# Patient Record
Sex: Male | Born: 1950 | Race: Black or African American | Hispanic: No | Smoking: Former smoker
Health system: Southern US, Community
[De-identification: ages and names within clinical notes are randomized; demographics above are authoritative.]

## PROBLEM LIST (undated history)

## (undated) DIAGNOSIS — K635 Polyp of colon: Secondary | ICD-10-CM

## (undated) DIAGNOSIS — I10 Essential (primary) hypertension: Secondary | ICD-10-CM

## (undated) DIAGNOSIS — I639 Cerebral infarction, unspecified: Secondary | ICD-10-CM

## (undated) DIAGNOSIS — R079 Chest pain, unspecified: Secondary | ICD-10-CM

## (undated) DIAGNOSIS — E785 Hyperlipidemia, unspecified: Secondary | ICD-10-CM

## (undated) DIAGNOSIS — Z531 Procedure and treatment not carried out because of patient's decision for reasons of belief and group pressure: Secondary | ICD-10-CM

## (undated) DIAGNOSIS — E119 Type 2 diabetes mellitus without complications: Secondary | ICD-10-CM

## (undated) HISTORY — PX: APPENDECTOMY: SHX54

## (undated) HISTORY — DX: Polyp of colon: K63.5

## (undated) HISTORY — DX: Hyperlipidemia, unspecified: E78.5

## (undated) HISTORY — DX: Cerebral infarction, unspecified: I63.9

## (undated) HISTORY — PX: LUMBAR DISC SURGERY: SHX700

## (undated) HISTORY — PX: BACK SURGERY: SHX140

---

## 2010-04-21 ENCOUNTER — Inpatient Hospital Stay (INDEPENDENT_AMBULATORY_CARE_PROVIDER_SITE_OTHER)
Admission: RE | Admit: 2010-04-21 | Discharge: 2010-04-21 | Disposition: A | Discharge status: Home / Self Care | Payer: Self-pay | Source: Ambulatory Visit | Attending: Family Medicine | Admitting: Family Medicine

## 2010-04-21 DIAGNOSIS — J019 Acute sinusitis, unspecified: Secondary | ICD-10-CM

## 2010-04-21 DIAGNOSIS — I10 Essential (primary) hypertension: Secondary | ICD-10-CM

## 2010-04-21 DIAGNOSIS — E119 Type 2 diabetes mellitus without complications: Secondary | ICD-10-CM

## 2015-08-25 ENCOUNTER — Other Ambulatory Visit: Payer: Self-pay | Admitting: Nurse Practitioner

## 2015-08-25 ENCOUNTER — Encounter: Payer: Self-pay | Admitting: Internal Medicine

## 2015-08-25 DIAGNOSIS — R109 Unspecified abdominal pain: Secondary | ICD-10-CM

## 2015-09-11 ENCOUNTER — Emergency Department (HOSPITAL_COMMUNITY): Payer: No Typology Code available for payment source

## 2015-09-11 ENCOUNTER — Emergency Department (HOSPITAL_COMMUNITY)
Admission: EM | Admit: 2015-09-11 | Discharge: 2015-09-11 | Disposition: A | Discharge status: Home / Self Care | Payer: No Typology Code available for payment source | Attending: Physician Assistant | Admitting: Physician Assistant

## 2015-09-11 ENCOUNTER — Encounter (HOSPITAL_COMMUNITY): Payer: Self-pay | Admitting: Family Medicine

## 2015-09-11 ENCOUNTER — Ambulatory Visit: Payer: Medicare (Managed Care) | Admitting: Sports Medicine

## 2015-09-11 DIAGNOSIS — Y939 Activity, unspecified: Secondary | ICD-10-CM | POA: Diagnosis not present

## 2015-09-11 DIAGNOSIS — S0990XA Unspecified injury of head, initial encounter: Secondary | ICD-10-CM | POA: Insufficient documentation

## 2015-09-11 DIAGNOSIS — Y999 Unspecified external cause status: Secondary | ICD-10-CM | POA: Insufficient documentation

## 2015-09-11 DIAGNOSIS — I10 Essential (primary) hypertension: Secondary | ICD-10-CM | POA: Diagnosis not present

## 2015-09-11 DIAGNOSIS — Y92410 Unspecified street and highway as the place of occurrence of the external cause: Secondary | ICD-10-CM | POA: Insufficient documentation

## 2015-09-11 DIAGNOSIS — R109 Unspecified abdominal pain: Secondary | ICD-10-CM

## 2015-09-11 DIAGNOSIS — R1031 Right lower quadrant pain: Secondary | ICD-10-CM | POA: Diagnosis not present

## 2015-09-11 DIAGNOSIS — E119 Type 2 diabetes mellitus without complications: Secondary | ICD-10-CM | POA: Diagnosis not present

## 2015-09-11 HISTORY — DX: Essential (primary) hypertension: I10

## 2015-09-11 LAB — CBC WITH DIFFERENTIAL/PLATELET
Basophils Absolute: 0 K/uL (ref 0.0–0.1)
Basophils Relative: 0 %
Eosinophils Absolute: 0.1 K/uL (ref 0.0–0.7)
Eosinophils Relative: 1 %
HCT: 39.3 % (ref 39.0–52.0)
Hemoglobin: 13 g/dL (ref 13.0–17.0)
Lymphocytes Relative: 35 %
Lymphs Abs: 1.6 K/uL (ref 0.7–4.0)
MCH: 26.3 pg (ref 26.0–34.0)
MCHC: 33.1 g/dL (ref 30.0–36.0)
MCV: 79.6 fL (ref 78.0–100.0)
Monocytes Absolute: 0.4 K/uL (ref 0.1–1.0)
Monocytes Relative: 8 %
Neutro Abs: 2.4 K/uL (ref 1.7–7.7)
Neutrophils Relative %: 56 %
Platelets: 195 K/uL (ref 150–400)
RBC: 4.94 MIL/uL (ref 4.22–5.81)
RDW: 12.7 % (ref 11.5–15.5)
WBC: 4.4 K/uL (ref 4.0–10.5)

## 2015-09-11 LAB — I-STAT CHEM 8, ED
BUN: 15 mg/dL (ref 6–20)
Calcium, Ion: 1.17 mmol/L (ref 1.12–1.23)
Chloride: 102 mmol/L (ref 101–111)
Creatinine, Ser: 1.2 mg/dL (ref 0.61–1.24)
Glucose, Bld: 175 mg/dL — ABNORMAL HIGH (ref 65–99)
HCT: 40 % (ref 39.0–52.0)
Hemoglobin: 13.6 g/dL (ref 13.0–17.0)
Potassium: 4 mmol/L (ref 3.5–5.1)
Sodium: 140 mmol/L (ref 135–145)
TCO2: 25 mmol/L (ref 0–100)

## 2015-09-11 MED ORDER — FENTANYL CITRATE (PF) 100 MCG/2ML IJ SOLN
50.0000 ug | Freq: Once | INTRAMUSCULAR | Status: DC
  Filled 2015-09-11: qty 2

## 2015-09-11 MED ORDER — CYCLOBENZAPRINE HCL 10 MG PO TABS: 10.0000 mg | ORAL_TABLET | Freq: Two times a day (BID) | ORAL | Status: DC | PRN

## 2015-09-11 MED ORDER — FENTANYL CITRATE (PF) 100 MCG/2ML IJ SOLN
50.0000 ug | Freq: Once | INTRAMUSCULAR | Status: AC
  Administered 2015-09-11: 50 ug via INTRAMUSCULAR

## 2015-09-11 MED ORDER — NAPROXEN 500 MG PO TABS: 500.0000 mg | ORAL_TABLET | Freq: Two times a day (BID) | ORAL | Status: DC

## 2015-09-11 NOTE — ED Notes (Signed)
Patient transported to CT 
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
## 2015-10-05 ENCOUNTER — Ambulatory Visit (AMBULATORY_SURGERY_CENTER): Payer: Self-pay | Admitting: *Deleted

## 2015-10-05 ENCOUNTER — Encounter (INDEPENDENT_AMBULATORY_CARE_PROVIDER_SITE_OTHER): Payer: Self-pay

## 2015-10-05 VITALS — Ht 68.0 in | Wt 159.6 lb

## 2015-10-05 DIAGNOSIS — Z1211 Encounter for screening for malignant neoplasm of colon: Secondary | ICD-10-CM

## 2015-10-05 MED ORDER — NA SULFATE-K SULFATE-MG SULF 17.5-3.13-1.6 GM/177ML PO SOLN: 1.0000 | Freq: Once | ORAL | 0 refills | Status: AC

## 2015-10-05 NOTE — Progress Notes (Signed)
No allergies to eggs or soy. No problems with anesthesia.  Pt given Emmi instructions for colonoscopy  No oxygen use  No diet drug use  

## 2015-10-19 ENCOUNTER — Encounter: Payer: Self-pay | Admitting: Internal Medicine

## 2015-10-19 ENCOUNTER — Ambulatory Visit (AMBULATORY_SURGERY_CENTER): Payer: Medicare Other | Admitting: Internal Medicine

## 2015-10-19 VITALS — BP 152/88 | HR 71 | Temp 97.5°F | Resp 14 | Ht 68.0 in | Wt 159.0 lb

## 2015-10-19 DIAGNOSIS — Z1211 Encounter for screening for malignant neoplasm of colon: Secondary | ICD-10-CM | POA: Diagnosis not present

## 2015-10-19 DIAGNOSIS — D128 Benign neoplasm of rectum: Secondary | ICD-10-CM | POA: Diagnosis not present

## 2015-10-19 DIAGNOSIS — D125 Benign neoplasm of sigmoid colon: Secondary | ICD-10-CM | POA: Diagnosis not present

## 2015-10-19 DIAGNOSIS — D122 Benign neoplasm of ascending colon: Secondary | ICD-10-CM | POA: Diagnosis not present

## 2015-10-19 DIAGNOSIS — D129 Benign neoplasm of anus and anal canal: Secondary | ICD-10-CM

## 2015-10-19 DIAGNOSIS — D120 Benign neoplasm of cecum: Secondary | ICD-10-CM

## 2015-10-19 LAB — GLUCOSE, CAPILLARY
GLUCOSE-CAPILLARY: 207 mg/dL — AB (ref 65–99)
Glucose-Capillary: 197 mg/dL — ABNORMAL HIGH (ref 65–99)

## 2015-10-19 MED ORDER — SODIUM CHLORIDE 0.9 % IV SOLN: 500.0000 mL | INTRAVENOUS | Status: DC

## 2015-10-19 NOTE — Progress Notes (Signed)
After procedure was over and the order req was in the computer, Dr. Henrene Pastor asked that I change specimen bottle #2 from cecal polyp to sigmoid colon polyp.  I removed the orfer req in the computer and made new order.  maw

## 2015-10-19 NOTE — Progress Notes (Signed)
A and O x3. Report to RN. Tolerated MAC anesthesia well. 

## 2015-10-19 NOTE — Patient Instructions (Signed)
YOU HAD AN ENDOSCOPIC PROCEDURE TODAY AT [facility]:   Refer to the procedure report that was given to you for any specific questions about what was found during the examination.  If the procedure report does not answer your questions, please call your gastroenterologist to clarify.  If you requested that your care partner not be given the details of your procedure findings, then the procedure report has been included in a sealed envelope for you to review at your convenience later.  YOU SHOULD EXPECT: Some feelings of bloating in the abdomen. Passage of more gas than usual.  Walking can help get rid of the air that was put into your GI tract during the procedure and reduce the bloating. If you had a lower endoscopy (such as a colonoscopy or flexible sigmoidoscopy) you may notice spotting of blood in your stool or on the toilet paper. If you underwent a bowel prep for your procedure, you may not have a normal bowel movement for a few days.  Please Note:  You might notice some irritation and congestion in your nose or some drainage.  This is from the oxygen used during your procedure.  There is no need for concern and it should clear up in a day or so.  SYMPTOMS TO REPORT IMMEDIATELY:   Following lower endoscopy (colonoscopy or flexible sigmoidoscopy):  Excessive amounts of blood in the stool  Significant tenderness or worsening of abdominal pains  Swelling of the abdomen that is new, acute  Fever of 100F or higher   For urgent or emergent issues, a gastroenterologist can be reached at any hour by calling [phone].   DIET:  We do recommend a small meal at first, but then you may proceed to your regular diet.  Drink plenty of fluids but you should avoid alcoholic beverages for 24 hours.  ACTIVITY:  You should plan to take it easy for the rest of today and you should NOT DRIVE or use heavy machinery until tomorrow (because of the sedation medicines used during the test).     FOLLOW UP: Our staff will call the number listed on your records the next business day following your procedure to check on you and address any questions or concerns that you may have regarding the information given to you following your procedure. If we do not reach you, we will leave a message.  However, if you are feeling well and you are not experiencing any problems, there is no need to return our call.  We will assume that you have returned to your regular daily activities without incident.  If any biopsies were taken you will be contacted by phone or by letter within the next 1-3 weeks.  Please call us at [phone] if you have not heard about the biopsies in 3 weeks.    SIGNATURES/CONFIDENTIALITY: You and/or your care partner have signed paperwork which will be entered into your electronic medical record.  These signatures attest to the fact that that the information above on your After Visit Summary has been reviewed and is understood.  Full responsibility of the confidentiality of this discharge information lies with you and/or your care-partner.   Continue present medications. Polyps handout provided.

## 2015-10-19 NOTE — Op Note (Signed)
[name] Procedure Date: 10/19/2015 7:46 AM MRN: [id number] Endoscopist: Docia Chuck. Richard Bennett , MD Age: 65 Referring MD:  Date of Birth: Jul 04, 1950 Gender: Male Account #: [id number] Procedure:                Colonoscopy, with cold snare polypectomy X3 Indications:              Screening for colorectal malignant neoplasm Medicines:                Monitored Anesthesia Care Procedure:                Pre-Anesthesia Assessment:                           - Prior to the procedure, a History and Physical                            was performed, and patient medications and                            allergies were reviewed. The patient's tolerance of                            previous anesthesia was also reviewed. The risks                            and benefits of the procedure and the sedation                            options and risks were discussed with the patient.                            All questions were answered, and informed consent                            was obtained. Prior Anticoagulants: The patient has                            taken no previous anticoagulant or antiplatelet                            agents. ASA Grade Assessment: II - A patient with                            mild systemic disease. After reviewing the risks                            and benefits, the patient was deemed in                            satisfactory condition to undergo the procedure.                           After obtaining informed consent, the colonoscope  was passed under direct vision. Throughout the                            procedure, the patient's blood pressure, pulse, and                            oxygen saturations were monitored continuously. The                            Model CF-HQ190L [id number]) scope was introduced                            through the anus and advanced to the the cecum,              identified by appendiceal orifice and ileocecal                            valve. The ileocecal valve, appendiceal orifice,                            and rectum were photographed. The quality of the                            bowel preparation was good. The colonoscopy was                            performed without difficulty. The patient tolerated                            the procedure well. The bowel preparation used was                            SUPREP. Scope In: 8:12:03 AM Scope Out: 8:32:28 AM Scope Withdrawal Time: 0 hours 16 minutes 19 seconds  Total Procedure Duration: 0 hours 20 minutes 25 seconds  Findings:                 Three polyps were found in the rectum, sigmoid                            colon and ascending colon. The polyps were 5 mm in                            size. These polyps were removed with a cold snare.                            Resection and retrieval were complete.                           The exam was otherwise without abnormality on                            direct and retroflexion views. Complications:            No immediate complications. Estimated blood loss:  None. Estimated Blood Loss:     Estimated blood loss: none. Impression:               - Three 5 mm polyps in the rectum, in the sigmoid                            colon and in the ascending colon, removed with a                            cold snare. Resected and retrieved.                           - The examination was otherwise normal on direct                            and retroflexion views. Recommendation:           - Repeat colonoscopy in 3 - 5 years for                            surveillance.                           - Patient has a contact number available for                            emergencies. The signs and symptoms of potential                            delayed complications were discussed with the                             patient. Return to normal activities tomorrow.                            Written discharge instructions were provided to the                            patient.                           - Resume previous diet.                           - Continue present medications.                           - Await pathology results. Docia Chuck. Richard Pastor, MD 10/19/2015 8:49:26 AM This report has been signed electronically. CC Letter to:             Eric L. Marlou Sa, M.D.. [facility]

## 2015-10-19 NOTE — Progress Notes (Signed)
Called to room to assist during endoscopic procedure.  Patient ID and intended procedure confirmed with present staff. Received instructions for my participation in the procedure from the performing physician.  

## 2015-10-20 ENCOUNTER — Telehealth: Payer: Self-pay

## 2015-10-20 NOTE — Telephone Encounter (Signed)
I spoke with him.  He got up from bed and walked around a bit and is not hurting now.  No tenderness when he pushes on his abdomen.  No fevers or chills.  I doubt perforation but he knows he needs to go to ER if the pains return.

## 2015-10-20 NOTE — Telephone Encounter (Signed)
  Follow up Call-  Call back number 10/19/2015  Post procedure Call Back phone  # [phone]  Permission to leave phone message Yes  Some recent data might be hidden     Patient questions:  Do you have a fever, pain , or abdominal swelling? Yes.   Pain Score  10 *  Have you tolerated food without any problems? Yes.    Have you been able to return to your normal activities? No.  Do you have any questions about your discharge instructions: Diet   No. Medications  No. Follow up visit  No.  Do you have questions or concerns about your Care? No.  Actions: * If pain score is 4 or above: Physician/ provider Notified : Owens Loffler, MD.  Patient reports that he has been up all night in pain that he rates a 10. Pain is under his right rib cage and in his lower stomach. I suggested that he go to the emergency room if his pain is that severe. Patient states he would like to wait until 12:00 noon to see if it gets better. Patient was requesting that Dr. Henrene Pastor give him some pain medication. Patient reports that he was able to eat yesterday and he says that he has not passed very much gas. I did suggest that if he had any OTC gas relief medication that he try that to see if it helps his pain. Please advise.

## 2015-10-20 NOTE — Telephone Encounter (Signed)
  Follow up Call-  Call back number 10/19/2015  Post procedure Call Back phone  # [phone]  Permission to leave phone message Yes  Some recent data might be hidden    Elan reports to Dr. Ardis Hughs that pain is better after walking. Patient was informed to go to ER if the pain returns.

## 2015-10-22 ENCOUNTER — Emergency Department (HOSPITAL_COMMUNITY): Payer: Medicare Other

## 2015-10-22 ENCOUNTER — Encounter (HOSPITAL_COMMUNITY): Payer: Self-pay | Admitting: Emergency Medicine

## 2015-10-22 ENCOUNTER — Emergency Department (HOSPITAL_COMMUNITY)
Admission: EM | Admit: 2015-10-22 | Discharge: 2015-10-22 | Disposition: A | Discharge status: Home / Self Care | Payer: Medicare Other | Attending: Emergency Medicine | Admitting: Emergency Medicine

## 2015-10-22 DIAGNOSIS — R1031 Right lower quadrant pain: Secondary | ICD-10-CM | POA: Diagnosis not present

## 2015-10-22 DIAGNOSIS — R109 Unspecified abdominal pain: Secondary | ICD-10-CM

## 2015-10-22 DIAGNOSIS — E119 Type 2 diabetes mellitus without complications: Secondary | ICD-10-CM | POA: Insufficient documentation

## 2015-10-22 DIAGNOSIS — Z87891 Personal history of nicotine dependence: Secondary | ICD-10-CM | POA: Insufficient documentation

## 2015-10-22 DIAGNOSIS — I10 Essential (primary) hypertension: Secondary | ICD-10-CM | POA: Insufficient documentation

## 2015-10-22 DIAGNOSIS — Z7982 Long term (current) use of aspirin: Secondary | ICD-10-CM | POA: Insufficient documentation

## 2015-10-22 LAB — COMPREHENSIVE METABOLIC PANEL
ALT: 20 U/L (ref 17–63)
ANION GAP: 9 (ref 5–15)
AST: 17 U/L (ref 15–41)
Albumin: 3.2 g/dL — ABNORMAL LOW (ref 3.5–5.0)
Alkaline Phosphatase: 54 U/L (ref 38–126)
BILIRUBIN TOTAL: 0.4 mg/dL (ref 0.3–1.2)
BUN: 13 mg/dL (ref 6–20)
CHLORIDE: 103 mmol/L (ref 101–111)
CO2: 26 mmol/L (ref 22–32)
Calcium: 9.5 mg/dL (ref 8.9–10.3)
Creatinine, Ser: 1.17 mg/dL (ref 0.61–1.24)
Glucose, Bld: 268 mg/dL — ABNORMAL HIGH (ref 65–99)
POTASSIUM: 3.8 mmol/L (ref 3.5–5.1)
Sodium: 138 mmol/L (ref 135–145)
TOTAL PROTEIN: 6.3 g/dL — AB (ref 6.5–8.1)

## 2015-10-22 LAB — CBC WITH DIFFERENTIAL/PLATELET
BASOS ABS: 0 10*3/uL (ref 0.0–0.1)
Basophils Relative: 1 %
EOS PCT: 2 %
Eosinophils Absolute: 0.1 10*3/uL (ref 0.0–0.7)
HCT: 36.2 % — ABNORMAL LOW (ref 39.0–52.0)
Hemoglobin: 11.7 g/dL — ABNORMAL LOW (ref 13.0–17.0)
LYMPHS PCT: 39 %
Lymphs Abs: 1.6 10*3/uL (ref 0.7–4.0)
MCH: 25.8 pg — AB (ref 26.0–34.0)
MCHC: 32.3 g/dL (ref 30.0–36.0)
MCV: 79.7 fL (ref 78.0–100.0)
MONO ABS: 0.3 10*3/uL (ref 0.1–1.0)
MONOS PCT: 8 %
Neutro Abs: 2 10*3/uL (ref 1.7–7.7)
Neutrophils Relative %: 50 %
PLATELETS: 170 10*3/uL (ref 150–400)
RBC: 4.54 MIL/uL (ref 4.22–5.81)
RDW: 12.8 % (ref 11.5–15.5)
WBC: 4 10*3/uL (ref 4.0–10.5)

## 2015-10-22 LAB — I-STAT CG4 LACTIC ACID, ED: LACTIC ACID, VENOUS: 1.05 mmol/L (ref 0.5–1.9)

## 2015-10-22 LAB — LIPASE, BLOOD: LIPASE: 24 U/L (ref 11–51)

## 2015-10-22 MED ORDER — SODIUM CHLORIDE 0.9 % IV BOLUS (SEPSIS): 1000.0000 mL | Freq: Once | INTRAVENOUS | Status: DC

## 2015-10-22 MED ORDER — LOSARTAN POTASSIUM 50 MG PO TABS
25.0000 mg | ORAL_TABLET | Freq: Once | ORAL | Status: AC
  Administered 2015-10-22: 25 mg via ORAL
  Filled 2015-10-22: qty 1

## 2015-10-22 MED ORDER — DICYCLOMINE HCL 20 MG PO TABS: 20.0000 mg | ORAL_TABLET | Freq: Two times a day (BID) | ORAL | 0 refills | Status: DC

## 2015-10-22 MED ORDER — CLONIDINE HCL 0.2 MG PO TABS
0.2000 mg | ORAL_TABLET | Freq: Once | ORAL | Status: AC
  Administered 2015-10-22: 0.2 mg via ORAL
  Filled 2015-10-22: qty 1

## 2015-10-22 MED ORDER — DICYCLOMINE HCL 10 MG/ML IM SOLN
20.0000 mg | Freq: Once | INTRAMUSCULAR | Status: AC
  Administered 2015-10-22: 20 mg via INTRAMUSCULAR
  Filled 2015-10-22: qty 2

## 2015-10-22 NOTE — ED Triage Notes (Addendum)
Reports having colonoscopy on 10/19/15.  Reports having continued pain since then.  Also reporting right upper chest pressure.  Also states I drank a bottle of wine last night thinking it would help but it didn't help at all.  Also want left leg checked out.  Reports that the leg goes from being cold to being hot.  States I think it must be infected.  No redness or warmth noted to leg during triage.

## 2015-10-22 NOTE — ED Notes (Signed)
Unable to get IV.  Provider aware.
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
## 2015-10-22 NOTE — Discharge Instructions (Addendum)
Make sure to take your blood pressure medications daily. Avoid salt intake. Take bentyl for abdominal pain as prescribed. Avoid any fatty foods. Follow up with family doctor. Return if worsening.

## 2015-10-22 NOTE — ED Provider Notes (Signed)
Pt signed out to me at shift change. Pt with RUQ abdominal pain which he states to me has been there for almost a year. Pt with prior negative CT abdomen/pelvis, negative [place] today, recent colonoscopy 3 days ago. His work up today is unremarkable with normal lipase and LFTs. Normal WBC. Plan to reasstess after bentyl and dc home.   7:13 AM Pt reassessed. States pain only mildly improved. He is in NAD. Non toxic. Not vomiting. Pt's BP was 204/107 upon arrival and at this time 239/136, recheck 233/122. Pt is asymptomatic for his BP. Wondering if pain is a factor. Will give clonidine to lower it just a bit prior to dc. He has a PCP and saw Dr. Henrene Pastor for his colonoscopy. Will need to follow up with both.   Vitals:   10/22/15 0730 10/22/15 0745 10/22/15 0800 10/22/15 0808  BP: (!) 235/119 (!) 208/104 (!) 210/105 190/91  Pulse: 76 77 76 75  Resp:    16  Temp:    98.5 F (36.9 C)  TempSrc:    Oral  SpO2: 100% 98% 99% 100%      Jeannett Senior, PA-C 10/22/15 1533    Gareth Morgan, MD 10/26/15 1334

## 2015-10-23 ENCOUNTER — Telehealth: Payer: Self-pay | Admitting: Internal Medicine

## 2015-10-23 NOTE — Telephone Encounter (Signed)
Left message for pt to call back  °

## 2015-10-24 NOTE — Telephone Encounter (Signed)
Pt states he is still having right sided abdominal pain. Pt is requesting to be seen. Pt scheduled to see Dr. Henrene Pastor 10/30/15@2 :15pm. Pt aware of appt date and time.

## 2015-10-30 ENCOUNTER — Ambulatory Visit: Payer: Medicare Other | Admitting: Internal Medicine

## 2015-10-30 ENCOUNTER — Encounter: Payer: Self-pay | Admitting: Internal Medicine

## 2015-10-30 ENCOUNTER — Inpatient Hospital Stay (HOSPITAL_COMMUNITY)
Admission: EM | Admit: 2015-10-30 | Discharge: 2015-11-02 | DRG: 683 | Disposition: A | Discharge status: Home / Self Care | Payer: Medicare Other | Attending: Internal Medicine | Admitting: Internal Medicine

## 2015-10-30 ENCOUNTER — Emergency Department (HOSPITAL_COMMUNITY): Payer: Medicare Other

## 2015-10-30 ENCOUNTER — Encounter (HOSPITAL_COMMUNITY): Payer: Self-pay | Admitting: Physical Medicine and Rehabilitation

## 2015-10-30 DIAGNOSIS — Z9119 Patient's noncompliance with other medical treatment and regimen: Secondary | ICD-10-CM

## 2015-10-30 DIAGNOSIS — I159 Secondary hypertension, unspecified: Secondary | ICD-10-CM

## 2015-10-30 DIAGNOSIS — I161 Hypertensive emergency: Secondary | ICD-10-CM | POA: Diagnosis present

## 2015-10-30 DIAGNOSIS — Z23 Encounter for immunization: Secondary | ICD-10-CM

## 2015-10-30 DIAGNOSIS — Z794 Long term (current) use of insulin: Secondary | ICD-10-CM

## 2015-10-30 DIAGNOSIS — E1159 Type 2 diabetes mellitus with other circulatory complications: Secondary | ICD-10-CM | POA: Diagnosis present

## 2015-10-30 DIAGNOSIS — N179 Acute kidney failure, unspecified: Principal | ICD-10-CM | POA: Diagnosis present

## 2015-10-30 DIAGNOSIS — R739 Hyperglycemia, unspecified: Secondary | ICD-10-CM

## 2015-10-30 DIAGNOSIS — T445X5A Adverse effect of predominantly beta-adrenoreceptor agonists, initial encounter: Secondary | ICD-10-CM | POA: Diagnosis present

## 2015-10-30 DIAGNOSIS — I152 Hypertension secondary to endocrine disorders: Secondary | ICD-10-CM | POA: Diagnosis present

## 2015-10-30 DIAGNOSIS — Z87891 Personal history of nicotine dependence: Secondary | ICD-10-CM

## 2015-10-30 DIAGNOSIS — I10 Essential (primary) hypertension: Secondary | ICD-10-CM | POA: Diagnosis present

## 2015-10-30 DIAGNOSIS — E871 Hypo-osmolality and hyponatremia: Secondary | ICD-10-CM | POA: Diagnosis present

## 2015-10-30 DIAGNOSIS — I160 Hypertensive urgency: Secondary | ICD-10-CM | POA: Diagnosis present

## 2015-10-30 DIAGNOSIS — E119 Type 2 diabetes mellitus without complications: Secondary | ICD-10-CM

## 2015-10-30 DIAGNOSIS — R079 Chest pain, unspecified: Secondary | ICD-10-CM | POA: Diagnosis present

## 2015-10-30 DIAGNOSIS — E785 Hyperlipidemia, unspecified: Secondary | ICD-10-CM | POA: Diagnosis present

## 2015-10-30 DIAGNOSIS — E1165 Type 2 diabetes mellitus with hyperglycemia: Secondary | ICD-10-CM | POA: Diagnosis present

## 2015-10-30 HISTORY — DX: Chest pain, unspecified: R07.9

## 2015-10-30 HISTORY — DX: Procedure and treatment not carried out because of patient's decision for reasons of belief and group pressure: Z53.1

## 2015-10-30 HISTORY — DX: Type 2 diabetes mellitus without complications: E11.9

## 2015-10-30 LAB — URINALYSIS, ROUTINE W REFLEX MICROSCOPIC
BILIRUBIN URINE: NEGATIVE
Glucose, UA: >1000 mg/dL — AB
Hgb urine dipstick: NEGATIVE
KETONES UR: NEGATIVE mg/dL
Leukocytes, UA: NEGATIVE
NITRITE: NEGATIVE
Protein, ur: >300 mg/dL — AB
Specific Gravity, Urine: 1.028 (ref 1.005–1.030)
pH: 6 (ref 5.0–8.0)

## 2015-10-30 LAB — CBC
HCT: 38.1 % — ABNORMAL LOW (ref 39.0–52.0)
Hemoglobin: 12.5 g/dL — ABNORMAL LOW (ref 13.0–17.0)
MCH: 26.2 pg (ref 26.0–34.0)
MCHC: 32.8 g/dL (ref 30.0–36.0)
MCV: 79.7 fL (ref 78.0–100.0)
PLATELETS: 189 10*3/uL (ref 150–400)
RBC: 4.78 MIL/uL (ref 4.22–5.81)
RDW: 12.8 % (ref 11.5–15.5)
WBC: 4.5 10*3/uL (ref 4.0–10.5)

## 2015-10-30 LAB — CBC WITH DIFFERENTIAL/PLATELET
Basophils Absolute: 0 10*3/uL (ref 0.0–0.1)
Basophils Relative: 1 %
EOS ABS: 0.1 10*3/uL (ref 0.0–0.7)
Eosinophils Relative: 2 %
HCT: 37.4 % — ABNORMAL LOW (ref 39.0–52.0)
HEMOGLOBIN: 12.4 g/dL — AB (ref 13.0–17.0)
LYMPHS ABS: 1.2 10*3/uL (ref 0.7–4.0)
Lymphocytes Relative: 31 %
MCH: 26.2 pg (ref 26.0–34.0)
MCHC: 33.2 g/dL (ref 30.0–36.0)
MCV: 78.9 fL (ref 78.0–100.0)
MONO ABS: 0.3 10*3/uL (ref 0.1–1.0)
MONOS PCT: 8 %
NEUTROS PCT: 58 %
Neutro Abs: 2.2 10*3/uL (ref 1.7–7.7)
Platelets: 168 10*3/uL (ref 150–400)
RBC: 4.74 MIL/uL (ref 4.22–5.81)
RDW: 13.2 % (ref 11.5–15.5)
WBC: 3.8 10*3/uL — ABNORMAL LOW (ref 4.0–10.5)

## 2015-10-30 LAB — I-STAT TROPONIN, ED
Troponin i, poc: 0.01 ng/mL (ref 0.00–0.08)
Troponin i, poc: 0.01 ng/mL (ref 0.00–0.08)

## 2015-10-30 LAB — COMPREHENSIVE METABOLIC PANEL
ALK PHOS: 59 U/L (ref 38–126)
ALT: 23 U/L (ref 17–63)
ANION GAP: 7 (ref 5–15)
AST: 24 U/L (ref 15–41)
Albumin: 3.4 g/dL — ABNORMAL LOW (ref 3.5–5.0)
BILIRUBIN TOTAL: 0.4 mg/dL (ref 0.3–1.2)
BUN: 17 mg/dL (ref 6–20)
CALCIUM: 9.4 mg/dL (ref 8.9–10.3)
CO2: 26 mmol/L (ref 22–32)
Chloride: 100 mmol/L — ABNORMAL LOW (ref 101–111)
Creatinine, Ser: 1.5 mg/dL — ABNORMAL HIGH (ref 0.61–1.24)
GFR calc non Af Amer: 47 mL/min — ABNORMAL LOW (ref >60)
GFR, EST AFRICAN AMERICAN: 55 mL/min — AB (ref >60)
Glucose, Bld: 458 mg/dL — ABNORMAL HIGH (ref 65–99)
Potassium: 4 mmol/L (ref 3.5–5.1)
SODIUM: 133 mmol/L — AB (ref 135–145)
TOTAL PROTEIN: 6.5 g/dL (ref 6.5–8.1)

## 2015-10-30 LAB — CBG MONITORING, ED
Glucose-Capillary: 367 mg/dL — ABNORMAL HIGH (ref 65–99)
Glucose-Capillary: 165 mg/dL — ABNORMAL HIGH (ref 65–99)

## 2015-10-30 LAB — URINE MICROSCOPIC-ADD ON: SQUAMOUS EPITHELIAL / LPF: NONE SEEN

## 2015-10-30 LAB — GLUCOSE, CAPILLARY
GLUCOSE-CAPILLARY: 264 mg/dL — AB (ref 65–99)
Glucose-Capillary: 150 mg/dL — ABNORMAL HIGH (ref 65–99)

## 2015-10-30 LAB — D-DIMER, QUANTITATIVE (NOT AT ARMC): D DIMER QUANT: 0.5 ug{FEU}/mL (ref 0.00–0.50)

## 2015-10-30 LAB — LIPASE, BLOOD: Lipase: 26 U/L (ref 11–51)

## 2015-10-30 LAB — TROPONIN I

## 2015-10-30 MED ORDER — MORPHINE SULFATE (PF) 2 MG/ML IV SOLN
2.0000 mg | INTRAVENOUS | Status: DC | PRN
  Administered 2015-10-30 – 2015-11-02 (×6): 2 mg via INTRAVENOUS
  Filled 2015-10-30 (×6): qty 1

## 2015-10-30 MED ORDER — SODIUM CHLORIDE 0.9 % IV SOLN
INTRAVENOUS | Status: AC
  Administered 2015-10-30: 18:00:00 via INTRAVENOUS

## 2015-10-30 MED ORDER — PNEUMOCOCCAL VAC POLYVALENT 25 MCG/0.5ML IJ INJ
0.5000 mL | INJECTION | INTRAMUSCULAR | Status: AC
  Administered 2015-10-31: 0.5 mL via INTRAMUSCULAR

## 2015-10-30 MED ORDER — INSULIN ASPART 100 UNIT/ML ~~LOC~~ SOLN
10.0000 [IU] | Freq: Once | SUBCUTANEOUS | Status: AC
Start: 2015-10-30 — End: 2015-10-30
  Administered 2015-10-30: 10 [IU] via INTRAVENOUS
  Filled 2015-10-30: qty 1

## 2015-10-30 MED ORDER — METOPROLOL TARTRATE 25 MG PO TABS
25.0000 mg | ORAL_TABLET | Freq: Two times a day (BID) | ORAL | Status: DC
  Administered 2015-10-30: 25 mg via ORAL
  Filled 2015-10-30: qty 1

## 2015-10-30 MED ORDER — ENOXAPARIN SODIUM 40 MG/0.4ML ~~LOC~~ SOLN
40.0000 mg | SUBCUTANEOUS | Status: DC
  Administered 2015-10-30 – 2015-11-01 (×3): 40 mg via SUBCUTANEOUS
  Filled 2015-10-30 (×3): qty 0.4

## 2015-10-30 MED ORDER — INSULIN ASPART 100 UNIT/ML ~~LOC~~ SOLN
0.0000 [IU] | Freq: Every day | SUBCUTANEOUS | Status: DC
  Administered 2015-10-30: 3 [IU] via SUBCUTANEOUS

## 2015-10-30 MED ORDER — INSULIN ASPART 100 UNIT/ML ~~LOC~~ SOLN
0.0000 [IU] | Freq: Three times a day (TID) | SUBCUTANEOUS | Status: DC
  Administered 2015-10-30: 2 [IU] via SUBCUTANEOUS
  Administered 2015-10-31 (×3): 3 [IU] via SUBCUTANEOUS
  Administered 2015-11-01: 2 [IU] via SUBCUTANEOUS
  Administered 2015-11-01: 3 [IU] via SUBCUTANEOUS
  Administered 2015-11-01: 8 [IU] via SUBCUTANEOUS

## 2015-10-30 MED ORDER — LABETALOL HCL 5 MG/ML IV SOLN
20.0000 mg | Freq: Once | INTRAVENOUS | Status: AC
  Administered 2015-10-30: 20 mg via INTRAVENOUS
  Filled 2015-10-30: qty 4

## 2015-10-30 MED ORDER — ALPRAZOLAM 0.25 MG PO TABS
0.2500 mg | ORAL_TABLET | Freq: Two times a day (BID) | ORAL | Status: DC | PRN
  Administered 2015-10-31: 0.25 mg via ORAL
  Filled 2015-10-30: qty 1

## 2015-10-30 MED ORDER — METOPROLOL TARTRATE 25 MG PO TABS
25.0000 mg | ORAL_TABLET | Freq: Two times a day (BID) | ORAL | Status: DC
  Administered 2015-10-30 – 2015-11-02 (×6): 25 mg via ORAL
  Filled 2015-10-30 (×6): qty 1

## 2015-10-30 MED ORDER — ASPIRIN 325 MG PO TABS
325.0000 mg | ORAL_TABLET | Freq: Every day | ORAL | Status: DC
  Administered 2015-10-30 – 2015-11-02 (×4): 325 mg via ORAL
  Filled 2015-10-30 (×3): qty 1

## 2015-10-30 MED ORDER — KETOROLAC TROMETHAMINE 30 MG/ML IJ SOLN
15.0000 mg | Freq: Once | INTRAMUSCULAR | Status: AC
  Administered 2015-10-30: 15 mg via INTRAVENOUS
  Filled 2015-10-30: qty 1

## 2015-10-30 MED ORDER — GI COCKTAIL ~~LOC~~
30.0000 mL | Freq: Four times a day (QID) | ORAL | Status: DC | PRN
  Administered 2015-10-31 – 2015-11-01 (×2): 30 mL via ORAL
  Filled 2015-10-30 (×2): qty 30

## 2015-10-30 MED ORDER — ACETAMINOPHEN 325 MG PO TABS: 650.0000 mg | ORAL_TABLET | ORAL | Status: DC | PRN

## 2015-10-30 MED ORDER — HYDRALAZINE HCL 20 MG/ML IJ SOLN
5.0000 mg | INTRAMUSCULAR | Status: DC | PRN
  Administered 2015-10-31: 10 mg via INTRAVENOUS
  Administered 2015-11-01: 5 mg via INTRAVENOUS
  Filled 2015-10-30 (×2): qty 1

## 2015-10-30 MED ORDER — INSULIN GLARGINE 100 UNIT/ML ~~LOC~~ SOLN
15.0000 [IU] | Freq: Every day | SUBCUTANEOUS | Status: DC
  Administered 2015-10-30 – 2015-10-31 (×2): 15 [IU] via SUBCUTANEOUS
  Filled 2015-10-30 (×3): qty 0.15

## 2015-10-30 MED ORDER — ONDANSETRON HCL 4 MG/2ML IJ SOLN
4.0000 mg | Freq: Four times a day (QID) | INTRAMUSCULAR | Status: DC | PRN
  Filled 2015-10-30: qty 2

## 2015-10-30 NOTE — ED Notes (Signed)
CBG: 367 RN notified

## 2015-10-30 NOTE — H&P (Signed)
History and Physical    Richard Bennett [id number] DOB: 02/27/1951 DOA: 10/30/2015  PCP: Boyce Medici, FNP Patient coming from: home  Chief Complaint: right chest pain  HPI: Richard Bennett is a 65 y.o. male with medical history significant for hypertension, diabetes, hyperlipidemia, noncompliance presents emergency Department chief complaint persistent worsening right anterior lower chest/rib pain. Initial evaluation reveals hypertensive emergency, acute kidney injury serum glucose 458.  Information is obtained from the patient. He reports being in emergency department 3 days ago complaints of abdominal pain. Workup was negative and he was discharged. He states since that time the pain has worsened and that his right rib feels "bruised". Describes the pain as sharp achy constant nonradiating. He denies headache dizziness syncope or near-syncope. He denies any abdominal pain nausea vomiting or diarrhea. He denies cough shortness of breath lower strandy edema or orthopnea. He denies any fever chills recent travel or sick contacts. He denies dysuria hematuria frequency or urgency. He is unable to tell me when his blood sugar as his he does not monitor    ED Course: In the emergency department blood pressure 173/101 he is afebrile not hypoxic. He is given labetalol  Review of Systems: As per HPI otherwise 10 point review of systems negative.   Ambulatory Status: Ambulates independently with steady gait  Past Medical History:  Diagnosis Date  . Chest pain   . Colon polyps    adenomatous  . Diabetes mellitus without complication ([place])   . Hyperlipidemia   . Hypertension     Past Surgical History:  Procedure Laterality Date  . BACK SURGERY      Social History   Social History  . Marital status: Single    Spouse name: N/A  . Number of children: N/A  . Years of education: N/A   Occupational History  . Not on file.   Social History Main Topics  . Smoking status: Former Smoker      Types: Cigarettes    Quit date: 03/04/1978  . Smokeless tobacco: Never Used  . Alcohol use Yes     Comment: reports drinks occassionally  . Drug use:     Types: Heroin     Comment: not in 30 years  . Sexual activity: Not on file   Other Topics Concern  . Not on file   Social History Narrative  . No narrative on file    No Known Allergies  Family History  Problem Relation Age of Onset  . Colon cancer Neg Hx   . Esophageal cancer Neg Hx   . Stomach cancer Neg Hx   . Rectal cancer Neg Hx     Prior to Admission medications   Medication Sig Start Date End Date Taking? Authorizing Provider  atorvastatin (LIPITOR) 10 MG tablet Take 10 mg by mouth at bedtime. 08/15/15  Yes Historical Provider, MD  losartan (COZAAR) 25 MG tablet Take 25 mg by mouth daily. 08/31/15  Yes Historical Provider, MD  LANTUS SOLOSTAR 100 UNIT/ML Solostar Pen Inject 20 Units into the skin every morning. 08/17/15   Historical Provider, MD  polyethylene glycol powder (GLYCOLAX/MIRALAX) powder Mix 17 grams with 8 ounces of water or juice and drink nightly 07/04/15   Historical Provider, MD    Physical Exam: Vitals:   10/30/15 1507 10/30/15 1515 10/30/15 1530 10/30/15 1545  BP: (!) 173/101 180/97 173/96 168/97  Pulse: 76 80 79 77  Resp: 20 17 17 14   Temp:      TempSrc:  SpO2: 100% 100% 100% 100%  Weight:      Height:         General:  Appears calm and comfortable No acute distress Eyes:  PERRL, EOMI, normal lids, iris ENT:  grossly normal hearing, lips & tongue, mucous membranes of his mouth are moist and pink Neck:  no LAD, masses or thyromegaly Cardiovascular:  RRR, no m/r/g. No LE edema. Pedal pulses present and palpable Respiratory:  CTA bilaterally, no w/r/r. Normal respiratory effort. Abdomen:  soft, ntnd, positive bowel sounds throughout Skin:  no rash or induration seen on limited exam Musculoskeletal:  grossly normal tone BUE/BLE, good ROM, no bony abnormality Psychiatric:  grossly  normal mood and affect, speech fluent and appropriate, AOx3 Neurologic:  CN 2-12 grossly intact, moves all extremities in coordinated fashion, sensation intact  Labs on Admission: I have personally reviewed following labs and imaging studies  CBC:  Recent Labs Lab 10/30/15 1042  WBC 3.8*  NEUTROABS 2.2  HGB 12.4*  HCT 37.4*  MCV 78.9  PLT [id number]   Basic Metabolic Panel:  Recent Labs Lab 10/30/15 1042  NA 133*  K 4.0  CL 100*  CO2 26  GLUCOSE 458*  BUN 17  CREATININE 1.50*  CALCIUM 9.4   GFR: Estimated Creatinine Clearance: 48.1 mL/min (by C-G formula based on SCr of 1.5 mg/dL). Liver Function Tests:  Recent Labs Lab 10/30/15 1042  AST 24  ALT 23  ALKPHOS 59  BILITOT 0.4  PROT 6.5  ALBUMIN 3.4*    Recent Labs Lab 10/30/15 1042  LIPASE 26   No results for input(s): AMMONIA in the last 168 hours. Coagulation Profile: No results for input(s): INR, PROTIME in the last 168 hours. Cardiac Enzymes: No results for input(s): CKTOTAL, CKMB, CKMBINDEX, TROPONINI in the last 168 hours. BNP (last 3 results) No results for input(s): PROBNP in the last 8760 hours. HbA1C: No results for input(s): HGBA1C in the last 72 hours. CBG:  Recent Labs Lab 10/30/15 1329  GLUCAP 367*   Lipid Profile: No results for input(s): CHOL, HDL, LDLCALC, TRIG, CHOLHDL, LDLDIRECT in the last 72 hours. Thyroid Function Tests: No results for input(s): TSH, T4TOTAL, FREET4, T3FREE, THYROIDAB in the last 72 hours. Anemia Panel: No results for input(s): VITAMINB12, FOLATE, FERRITIN, TIBC, IRON, RETICCTPCT in the last 72 hours. Urine analysis:    Component Value Date/Time   COLORURINE YELLOW 10/30/2015 [place] 10/30/2015 1138   LABSPEC 1.028 10/30/2015 1138   PHURINE 6.0 10/30/2015 1138   GLUCOSEU >1000 (A) 10/30/2015 1138   HGBUR NEGATIVE 10/30/2015 1138   BILIRUBINUR NEGATIVE 10/30/2015 1138   KETONESUR NEGATIVE 10/30/2015 1138   PROTEINUR >300 (A) 10/30/2015  1138   NITRITE NEGATIVE 10/30/2015 1138   LEUKOCYTESUR NEGATIVE 10/30/2015 1138    Creatinine Clearance: Estimated Creatinine Clearance: 48.1 mL/min (by C-G formula based on SCr of 1.5 mg/dL).  Sepsis Labs: @LABRCNTIP (procalcitonin:4,lacticidven:4) )No results found for this or any previous visit (from the past 240 hour(s)).   Radiological Exams on Admission: Dg Chest 2 View  Result Date: 10/30/2015 CLINICAL DATA:  Right side rib pain ongoing for 2 weeks. EXAM: CHEST  2 VIEW COMPARISON:  10/22/2015 FINDINGS: Heart and mediastinal contours are within normal limits. No focal opacities or effusions. No acute bony abnormality. Radiopaque BB projects over the right chest, stable. IMPRESSION: No active cardiopulmonary disease. Electronically Signed   By: Rolm Baptise M.D.   On: 10/30/2015 11:20    EKG: Independently reviewed.Sinus rhythm Probable left atrial enlargement Borderline  intraventricular conduction delay  Assessment/Plan Principal Problem:   Hypertensive emergency Active Problems:   Chest pain   Hypertension   Hyperlipidemia   Diabetes mellitus without complication ([place])   Hyponatremia   AKI (acute kidney injury) ([place])   #1. Hypertensive emergency. Patient with a known history of noncompliance. Blood pressure elevated on presentation. Home medications include losartan. Of note he was seen in the emergency department 3 days ago for abdominal pain. At that time CT abdomen/pelvis negative, abdominal ultrasound negative and blood pressure was 239/136. He was given clonidine and discharged instructed to see his PCP. Provided with labetalol in the emergency department and blood pressure improved. Home medications include losartan -Admit to telemetry -Metoprolol 25 mg twice a day with parameters -Monitor closely  #2. Chest pain. Workup for abdominal pain with CT abdomen pelvis as well as abdominal ultrasound negative. Initial troponin negative. Dimer within the limits of normal  lipase within the limits of normal. EKG as noted above. Chest xray with no active cardiopulmonary disease -Serial EKG -Cycle troponin -Lipid panel -asa  #3. Diabetes. Uncontrolled Noncompliant. Home medications include Lantus. Serum glucose 458. He was given 10 units of NovoLog in the emergency department. -We'll obtain a hemoglobin A1c -Resume Lantus at a slightly lower dose -Sliding scale insulin for optimal control -Consult diabetes coordinator  #4. Acute kidney injury. Creatinine 1.5 on admission. I suspect there is a chronic component is well. -Hold nephrotoxins -Gentle IV fluids -Monitor urine output -Recheck in the morning  #5. Hyponatremia. Mild. Sodium levels 133. Likely related #3. -Gentle IV fluids -Monitor    DVT prophylaxis: heparin scd  Code Status: full  Family Communication: none present  Disposition Plan: home  Consults called: none  Admission status: obs    Dyanne Carrel M MD [facility]  If 7PM-7AM, please contact night-coverage [email or web address] Password [facility]  10/30/2015, 4:05 PM

## 2015-10-30 NOTE — ED Triage Notes (Signed)
Pt reports R sided rib pain. Ongoing x2 weeks. Pain increases with movement. Reports issues with same in past.

## 2015-10-30 NOTE — ED Notes (Signed)
Pt returned from XR. Pt on monitors 

## 2015-10-30 NOTE — ED Provider Notes (Signed)
MSE was initiated and I personally evaluated the patient and placed orders (if any) at  10:24 AM on October 30, 2015.  The patient appears stable so that the remainder of the MSE may be completed by another provider.  65 year old male with history of hypertension here with continued right upper abdominal pain and right lower rib pain. Was seen for this 3 days ago with essentially negative workup. He states now pain is worse than before causing him to not even be able to sit upright. He states his right upper abdomen feels "swollen". He denies any nausea, vomiting, or diarrhea. He denies shortness of breath, just reports a "pressure" in his right lower ribs. No fever or chills. No sick contacts. States he has bowel movements 3 out of 7 days a week but that is normal for him. No urinary symptoms.  Patient with recurrent abdominal pain with recent ED visit, now reports worsening pain. He will need further workup including labs +/- imaging so will be moved to acute bed for further evaluation.  Initials orders have been placed.   Larene Pickett, PA-C 10/30/15 [place], MD 10/30/15 [phone]

## 2015-10-30 NOTE — ED Notes (Signed)
Dr. Laverta Baltimore at bedside - ultrasound at bedside for Dr. Laverta Baltimore to attempt to start IV.

## 2015-10-30 NOTE — ED Notes (Signed)
CBG: 165 RN notified

## 2015-10-30 NOTE — ED Provider Notes (Signed)
[facility] DEPT Provider Note   CSN: [id number] Arrival date & time: 10/30/15  0945     History   Chief Complaint Chief Complaint  Patient presents with  . Chest Pain    HPI Richard Bennett is a 65 y.o. male.  Patient is a 65 year old male with past medical history of hypertension, diabetes and hyperlipidemia presents the ED with complaint of worsening right lower chest pain. Patient reports he has had constant pain and discomfort to his right lower chest wall and right upper abdomen for the past month. Patient describes the pain as "I feel like there is swelling under my ribs which is pushing up against my chest causing me to have pain". Patient reports pain is worse when flexing his abs to bend and sit up in bed. Patient states that he feels like his right lower chest wall is "swollen". Denies radiation. Denies fever, chills, headache, lightheadedness, dizziness, diaphoresis, cough, shortness of breath, palpitations, nausea, vomiting, diarrhea, urinary symptoms, numbness, tingling, weakness. Patient notes he was seen in the ED on 8/20 for similar symptoms and was discharged home with symptomatic treatment. Patient denies relief with pain medications. He also reports that he stopped taking his Lantus a month ago due to feeling weird when he walks outside in the sun after he takes his Lantus. Patient states he has not been monitoring his glucose at home but states he has been taking all of his other medications as prescribed. Denies personal or family history of heart ease. Denies smoking.      Past Medical History:  Diagnosis Date  . Chest pain   . Colon polyps    adenomatous  . Diabetes mellitus without complication ([place])   . Hyperlipidemia   . Hypertension     Patient Active Problem List   Diagnosis Date Noted  . Hypertensive emergency 10/30/2015  . Hyponatremia 10/30/2015  . AKI (acute kidney injury) ([place]) 10/30/2015  . Chest pain   . Hypertension   . Hyperlipidemia     . Diabetes mellitus without complication [facility])     Past Surgical History:  Procedure Laterality Date  . BACK SURGERY         Home Medications    Prior to Admission medications   Medication Sig Start Date End Date Taking? Authorizing Provider  atorvastatin (LIPITOR) 10 MG tablet Take 10 mg by mouth at bedtime. 08/15/15  Yes Historical Provider, MD  losartan (COZAAR) 25 MG tablet Take 25 mg by mouth daily. 08/31/15  Yes Historical Provider, MD  LANTUS SOLOSTAR 100 UNIT/ML Solostar Pen Inject 20 Units into the skin every morning. 08/17/15   Historical Provider, MD  polyethylene glycol powder (GLYCOLAX/MIRALAX) powder Mix 17 grams with 8 ounces of water or juice and drink nightly 07/04/15   Historical Provider, MD    Family History Family History  Problem Relation Age of Onset  . Colon cancer Neg Hx   . Esophageal cancer Neg Hx   . Stomach cancer Neg Hx   . Rectal cancer Neg Hx     Social History Social History  Substance Use Topics  . Smoking status: Former Smoker    Types: Cigarettes    Quit date: 03/04/1978  . Smokeless tobacco: Never Used  . Alcohol use Yes     Comment: reports drinks occassionally     Allergies   Review of patient's allergies indicates no known allergies.   Review of Systems Review of Systems  Cardiovascular: Positive for chest pain.  Gastrointestinal: Positive for abdominal pain.  All other systems reviewed and are negative.    Physical Exam Updated Vital Signs BP 168/97   Pulse 77   Temp 98 F (36.7 C) (Oral)   Resp 14   Ht 5\' 8"  (1.727 m)   Wt 72.6 kg   SpO2 100%   BMI 24.33 kg/m   Physical Exam  Constitutional: He is oriented to person, place, and time. He appears well-developed and well-nourished. No distress.  HENT:  Head: Normocephalic and atraumatic.  Mouth/Throat: Oropharynx is clear and moist. No oropharyngeal exudate.  Eyes: Conjunctivae and EOM are normal. Pupils are equal, round, and reactive to light. Right eye  exhibits no discharge. Left eye exhibits no discharge. No scleral icterus.  Neck: Normal range of motion. Neck supple.  Cardiovascular: Normal rate, regular rhythm, normal heart sounds and intact distal pulses.   Pulmonary/Chest: Effort normal and breath sounds normal. No respiratory distress. He has no wheezes. He has no rales. He exhibits no tenderness.  Abdominal: Soft. Bowel sounds are normal. He exhibits no distension and no mass. There is no tenderness. There is no rebound and no guarding. No hernia.  Musculoskeletal: Normal range of motion. He exhibits no edema.  Lymphadenopathy:    He has no cervical adenopathy.  Neurological: He is alert and oriented to person, place, and time.  Skin: Skin is warm and dry. He is not diaphoretic.  Nursing note and vitals reviewed.    ED Treatments / Results  Labs (all labs ordered are listed, but only abnormal results are displayed) Labs Reviewed  CBC WITH DIFFERENTIAL/PLATELET - Abnormal; Notable for the following:       Result Value   WBC 3.8 (*)    Hemoglobin 12.4 (*)    HCT 37.4 (*)    All other components within normal limits  COMPREHENSIVE METABOLIC PANEL - Abnormal; Notable for the following:    Sodium 133 (*)    Chloride 100 (*)    Glucose, Bld 458 (*)    Creatinine, Ser 1.50 (*)    Albumin 3.4 (*)    GFR calc non Af Amer 47 (*)    GFR calc Af Amer 55 (*)    All other components within normal limits  URINALYSIS, ROUTINE W REFLEX MICROSCOPIC (NOT AT [facility]) - Abnormal; Notable for the following:    Glucose, UA >1000 (*)    Protein, ur >300 (*)    All other components within normal limits  URINE MICROSCOPIC-ADD ON - Abnormal; Notable for the following:    Bacteria, UA RARE (*)    Casts HYALINE CASTS (*)    All other components within normal limits  CBG MONITORING, ED - Abnormal; Notable for the following:    Glucose-Capillary 367 (*)    All other components within normal limits  LIPASE, BLOOD  D-DIMER, QUANTITATIVE (NOT AT  [facility])  HEMOGLOBIN A1C  I-STAT TROPOININ, ED  I-STAT TROPOININ, ED    EKG  EKG Interpretation  Date/Time:  Monday October 30 2015 10:53:22 EDT Ventricular Rate:  86 PR Interval:    QRS Duration: 112 QT Interval:  388 QTC Calculation: 465 R Axis:   49 Text Interpretation:  Sinus rhythm Probable left atrial enlargement Borderline intraventricular conduction delay Nonspecific T abnrm, anterolateral leads No STEMI.  Confirmed by LONG MD, JOSHUA [phone]) on 10/30/2015 10:59:59 AM       Radiology Dg Chest 2 View  Result Date: 10/30/2015 CLINICAL DATA:  Right side rib pain ongoing for 2 weeks. EXAM: CHEST  2 VIEW COMPARISON:  10/22/2015  FINDINGS: Heart and mediastinal contours are within normal limits. No focal opacities or effusions. No acute bony abnormality. Radiopaque BB projects over the right chest, stable. IMPRESSION: No active cardiopulmonary disease. Electronically Signed   By: Rolm Baptise M.D.   On: 10/30/2015 11:20    Procedures Procedures (including critical care time)  Medications Ordered in ED Medications  acetaminophen (TYLENOL) tablet 650 mg (not administered)  ondansetron (ZOFRAN) injection 4 mg (not administered)  enoxaparin (LOVENOX) injection 40 mg (not administered)  morphine 2 MG/ML injection 2 mg (2 mg Intravenous Given 10/30/15 1513)  gi cocktail (Maalox,Lidocaine,Donnatal) (not administered)  metoprolol tartrate (LOPRESSOR) tablet 25 mg (25 mg Oral Given 10/30/15 1506)  ALPRAZolam (XANAX) tablet 0.25 mg (not administered)  insulin glargine (LANTUS) injection 15 Units (not administered)  insulin aspart (novoLOG) injection 0-15 Units (not administered)  insulin aspart (novoLOG) injection 0-5 Units (not administered)  insulin aspart (novoLOG) injection 10 Units (10 Units Intravenous Given 10/30/15 1330)  labetalol (NORMODYNE,TRANDATE) injection 20 mg (20 mg Intravenous Given 10/30/15 1325)  ketorolac (TORADOL) 30 MG/ML injection 15 mg (15 mg Intravenous Given  10/30/15 1455)     Initial Impression / Assessment and Plan / ED Course  I have reviewed the triage vital signs and the nursing notes.  Pertinent labs & imaging results that were available during my care of the patient were reviewed by me and considered in my medical decision making (see chart for details).  Clinical Course    Patient presents with right lower chest wall pain that has been present for the past month and gradually worsening. Denies any recent injury or trauma. BP 165/85, remaining vital stable VSS. Exam unremarkable, no chest wall tenderness or abdominal tenderness.   Chart review shows patient was seen in the ED on 820 for similar symptoms. Labs unremarkable, chest x-ray negative, complete abdominal ultrasound unremarkable. He was discharged home with symptomatic treatment advised to follow up with PCP.  EKG showed sinus rhythm with no acute ischemic changes. Troponin negative. HEART score 3. Glucose 458, no anion gap. Urine positive for glucose, no ketones. Patient given 10 units of insulin in the ED. On reevaluation I was notified by nurse that patient's blood pressure elevated to 219/115. On reevaluation patient denies any pain or complaints at this time besides his right lower chest wall pain which is unchanged from prior. Patient denies headache, visual changes, lightheadedness, dizziness, shortness of breath, abdominal pain, numbness, tingling, weakness. Patient given 20 mg labetalol IV. Reevaluation patient's blood pressure improved to 168/97. Due to patient with continued and worsening chest pain and hypertensive urgency plan to admit for cardiac rule out. Consulted hospitalist. NP, Dyanne Carrel agrees to admission. Orders placed for obs telemetry bed. Discussed results and plan for admission with patient.   Final Clinical Impressions(s) / ED Diagnoses   Final diagnoses:  Chest pain, unspecified chest pain type  Secondary hypertension, unspecified  Hyperglycemia     New Prescriptions Current Discharge Medication List       Nona Dell, [place] 10/30/15 [place], MD 10/30/15 1640

## 2015-10-31 DIAGNOSIS — E119 Type 2 diabetes mellitus without complications: Secondary | ICD-10-CM | POA: Diagnosis not present

## 2015-10-31 DIAGNOSIS — I161 Hypertensive emergency: Secondary | ICD-10-CM | POA: Diagnosis not present

## 2015-10-31 DIAGNOSIS — E1165 Type 2 diabetes mellitus with hyperglycemia: Secondary | ICD-10-CM | POA: Diagnosis not present

## 2015-10-31 DIAGNOSIS — E785 Hyperlipidemia, unspecified: Secondary | ICD-10-CM | POA: Diagnosis not present

## 2015-10-31 DIAGNOSIS — N179 Acute kidney failure, unspecified: Secondary | ICD-10-CM | POA: Diagnosis present

## 2015-10-31 DIAGNOSIS — T445X5A Adverse effect of predominantly beta-adrenoreceptor agonists, initial encounter: Secondary | ICD-10-CM | POA: Diagnosis not present

## 2015-10-31 DIAGNOSIS — R079 Chest pain, unspecified: Secondary | ICD-10-CM | POA: Diagnosis present

## 2015-10-31 DIAGNOSIS — Z794 Long term (current) use of insulin: Secondary | ICD-10-CM | POA: Diagnosis not present

## 2015-10-31 DIAGNOSIS — Z87891 Personal history of nicotine dependence: Secondary | ICD-10-CM | POA: Diagnosis not present

## 2015-10-31 DIAGNOSIS — I10 Essential (primary) hypertension: Secondary | ICD-10-CM | POA: Diagnosis not present

## 2015-10-31 DIAGNOSIS — Z23 Encounter for immunization: Secondary | ICD-10-CM | POA: Diagnosis not present

## 2015-10-31 DIAGNOSIS — Z9119 Patient's noncompliance with other medical treatment and regimen: Secondary | ICD-10-CM | POA: Diagnosis not present

## 2015-10-31 DIAGNOSIS — I160 Hypertensive urgency: Secondary | ICD-10-CM | POA: Diagnosis not present

## 2015-10-31 DIAGNOSIS — E871 Hypo-osmolality and hyponatremia: Secondary | ICD-10-CM | POA: Diagnosis not present

## 2015-10-31 DIAGNOSIS — M7989 Other specified soft tissue disorders: Secondary | ICD-10-CM | POA: Diagnosis not present

## 2015-10-31 DIAGNOSIS — R1011 Right upper quadrant pain: Secondary | ICD-10-CM | POA: Diagnosis not present

## 2015-10-31 LAB — RAPID URINE DRUG SCREEN, HOSP PERFORMED
Amphetamines: NOT DETECTED
BARBITURATES: NOT DETECTED
BENZODIAZEPINES: NOT DETECTED
COCAINE: NOT DETECTED
Opiates: POSITIVE — AB
TETRAHYDROCANNABINOL: NOT DETECTED

## 2015-10-31 LAB — BASIC METABOLIC PANEL
Anion gap: 7 (ref 5–15)
BUN: 21 mg/dL — ABNORMAL HIGH (ref 6–20)
CALCIUM: 9 mg/dL (ref 8.9–10.3)
CO2: 27 mmol/L (ref 22–32)
CREATININE: 1.33 mg/dL — AB (ref 0.61–1.24)
Chloride: 107 mmol/L (ref 101–111)
GFR, EST NON AFRICAN AMERICAN: 55 mL/min — AB (ref >60)
Glucose, Bld: 211 mg/dL — ABNORMAL HIGH (ref 65–99)
Potassium: 4 mmol/L (ref 3.5–5.1)
Sodium: 141 mmol/L (ref 135–145)

## 2015-10-31 LAB — HEMOGLOBIN A1C
Hgb A1c MFr Bld: 9.6 % — ABNORMAL HIGH (ref 4.8–5.6)
MEAN PLASMA GLUCOSE: 229 mg/dL

## 2015-10-31 LAB — LIPID PANEL
Cholesterol: 162 mg/dL (ref 0–200)
HDL: 43 mg/dL (ref >40)
LDL Cholesterol: 102 mg/dL — ABNORMAL HIGH (ref 0–99)
Total CHOL/HDL Ratio: 3.8 RATIO
Triglycerides: 87 mg/dL (ref <150)
VLDL: 17 mg/dL (ref 0–40)

## 2015-10-31 LAB — GLUCOSE, CAPILLARY
GLUCOSE-CAPILLARY: 175 mg/dL — AB (ref 65–99)
GLUCOSE-CAPILLARY: 157 mg/dL — AB (ref 65–99)
GLUCOSE-CAPILLARY: 182 mg/dL — AB (ref 65–99)
Glucose-Capillary: 170 mg/dL — ABNORMAL HIGH (ref 65–99)

## 2015-10-31 MED ORDER — LIVING WELL WITH DIABETES BOOK
Freq: Once | Status: AC
  Administered 2015-10-31: 18:00:00
  Filled 2015-10-31: qty 1

## 2015-10-31 MED ORDER — AMLODIPINE BESYLATE 5 MG PO TABS
5.0000 mg | ORAL_TABLET | Freq: Every day | ORAL | Status: DC
  Administered 2015-10-31 – 2015-11-02 (×3): 5 mg via ORAL
  Filled 2015-10-31 (×3): qty 1

## 2015-10-31 MED ORDER — SODIUM CHLORIDE 0.9 % IV SOLN
INTRAVENOUS | Status: DC
  Administered 2015-10-31 (×2): via INTRAVENOUS

## 2015-10-31 MED ORDER — ATORVASTATIN CALCIUM 10 MG PO TABS
10.0000 mg | ORAL_TABLET | Freq: Every day | ORAL | Status: DC
  Administered 2015-10-31: 10 mg via ORAL
  Filled 2015-10-31 (×2): qty 1

## 2015-10-31 NOTE — Care Management Obs Status (Signed)
[facility] NOTIFICATION   Patient Details  Name: Richard Bennett MRN: [id number] Date of Birth: 06/24/1950   Medicare Observation Status Notification Given:  Yes    Dawayne Patricia, RN 10/31/2015, 11:32 AM

## 2015-10-31 NOTE — Progress Notes (Signed)
Inpatient Diabetes Program Recommendations  AACE/ADA: New Consensus Statement on Inpatient Glycemic Control (2015)  Target Ranges:  Prepandial:   less than 140 mg/dL      Peak postprandial:   less than 180 mg/dL (1-2 hours)      Critically ill patients:  140 - 180 mg/dL  Results for Richard Bennett, Richard Bennett (MRN [id number]) as of 10/31/2015 15:02  Ref. Range 10/30/2015 13:29 10/30/2015 16:35 10/30/2015 21:14 10/31/2015 06:12 10/31/2015 11:16  Glucose-Capillary Latest Ref Range: 65 - 99 mg/dL 367 (H) 150 (H) 264 (H) 175 (H) 157 (H)    Review of Glycemic Control  Diabetes history: DM2 Outpatient Diabetes medications: Lantus 20 units QAM Current orders for Inpatient glycemic control: Lantus 15 units QHS, Novolog 0-15 units TID with meals, Novolog 0-5 units QHS  Inpatient Diabetes Program Recommendations: Outpatient Follow Up:  Encourage patient to follow up with FNP regarding glycemic control.  NOTE: Spoke with patient over the phone about diabetes and home regimen for diabetes control. Patient reports that he is followed by Rosalee Kaufman, FNP for diabetes management and currently he takes Lantus 20 units QAM as an outpatient for diabetes control. Patient reports that he last seen Rosalee Kaufman, [place] in June 2017.  Patient reports that he is taking insulin as prescribed and no changes were made with his insulin at his last office visit. Patient reported that he use to take Metformin but initially said "my doctor took me off the Metformin because it is not good to take it with insulin." Patient reports he stopped Metformin about 8 months ago.  Discussed ADA recommendations and standards of care as far as using Metformin along with other pharmacological therapies to improve glycemic control.  In further discussing Metformin patient admits that "I stopped taking it because the way it made me feel."  Asked patient to explain how the Metformin made him feel and he stated "It made me feel out of it, like I was going to fall  out." Inquired if he ever checked his glucose to determine if he was feeling different because of hypoglycemia or hyperglycemia and patient states that he never checked his glucose during incidents when he was feeling different. Patient states that he has not been checking his glucose at home because when he was checking it "it was good so I stopped checking it."  Patient states that he has everything at home except lancets to monitor his glucose.  Discussed glucose and A1C goals. Discussed importance of checking CBGs and maintaining good CBG control to prevent long-term and short-term complications. Explained how hyperglycemia leads to damage within blood vessels which lead to the common complications seen with uncontrolled diabetes. Stressed to the patient the importance of improving glycemic control to prevent further complications from uncontrolled diabetes. Discussed impact of nutrition, exercise, stress, sickness, and medications on diabetes control. Patient admits that "I could do better" with following a carb modified diet. Discussed carbohydrates, carbohydrate goals per day and meal, along with portion sizes. Informed patient a consult for RD to see while inpatient would be ordered and patient is agreeable.  Encouraged patient to check his glucose 4 times per day (before meals and at bedtime) and to keep a log book of glucose readings and insulin taken which he will need to take to follow up doctor appointments. Explained how a log book can be used to continue to make insulin adjustments if needed. Patient states that he prefer not to take multiple medications for diabetes and he states that he plans to  start going to a fitness center in the near future. Explained to the patient that he may need multiple DM medications for glycemic control as there are several classes of DM medications that work in different ways to improve diabetes control. Patient is adamant that he is NOT willing to resume Metformin.  Patient verbalized understanding of information discussed and he states that he has no further questions at this time related to diabetes.  Will continue to follow along while inpatient and make further recommendations as more data is collected. At time of discharge please provide patient with Rx for lancets.  Thanks, Barnie Alderman, RN, MSN, CDE Diabetes Coordinator Inpatient Diabetes Program [phone] (Team Pager) [phone] ([place] office) [phone] [facility] office) [phone] [facility] office)

## 2015-10-31 NOTE — Progress Notes (Signed)
PROGRESS NOTE    Richard Bennett  [id number] DOB: 02/23/1951 DOA: 10/30/2015 PCP: Boyce Medici, FNP   Brief Narrative:  Per Dr Ruben Gottron is a 65 y.o. male with medical history significant for hypertension, diabetes, hyperlipidemia, noncompliance presents emergency Department chief complaint persistent worsening right anterior lower chest/rib pain. Initial evaluation revealled hypertensive emergency, acute kidney injury serum glucose 458.  Information was obtained from the patient. He reported being in emergency department 3 days ago complaints of abdominal pain. Workup was negative and he was discharged. He stated since that time the pain has worsened and that his right rib feels "bruised". Described the pain as sharp achy constant nonradiating. He denied headache dizziness syncope or near-syncope. He denied any abdominal pain nausea vomiting or diarrhea. He denied cough shortness of breath lower strandy edema or orthopnea. He denied any fever chills recent travel or sick contacts. He denied dysuria hematuria frequency or urgency. He is unable to tell when his blood sugar as his he does not monitor    ED Course: In the emergency department blood pressure 173/101 he is afebrile not hypoxic. He is given labetalol     Assessment & Plan:   Principal Problem:   Hypertensive emergency Active Problems:   Chest pain   Hypertension   Hyperlipidemia   Diabetes mellitus without complication ([place])   Hyponatremia   AKI (acute kidney injury) ([place])  #1 hypertensive emergency Likely secondary to medical noncompliance although patient states she's been compliant with his medications. EKG consistent with LVH. Check a 2-D echo. Continue metoprolol. Will add Norvasc 5 mg daily and up titrate as needed. Follow.  #2 hyperlipidemia Fasting lipid panel with LDL of 102 total cholesterol of 162. Continue statin.  #3 chest pain Likely secondary to problem #1. Cardiac enzymes negative  3. Clinical improvement. Check a 2-D echo. Continue aspirin, Lipitor, metoprolol. If 2-D echo is normal outpatient follow-up.  #4 acute kidney injury Likely secondary to prerenal azotemia in the setting of ARB. ARB on hold. Renal function trending down. Continue gentle hydration.  #5 poorly controlled diabetes mellitus Hemoglobin A1c 9.6. Patient likely noncompliant. Patient stated he stopped Lantus on his salt as she had some feelings of dizziness. Patient with no signs of dizziness results laceration on a reduced dose of Lantus. Continue current dose of Lantus and sliding scale insulin.  #6 hyponatremia Resolved with hydration.    DVT prophylaxis: Lovenox Code Status: Full Family Communication: Updated patient. No family present. Disposition Plan: Home hopefully tomorrow when medically stable and blood pressure has improved with no further chest pain and if 2-D echo is normal.   Consultants:   None  Procedures:   Chest x-ray 10/30/2015  Antimicrobials:   None   Subjective: Patient states right-sided chest pain has improved. No shortness of breath. Patient insistent that he's been compliant with his medications.  Objective: Vitals:   10/30/15 2118 10/31/15 0335 10/31/15 0443 10/31/15 0749  BP: (!) 167/75 (!) 201/90 (!) 180/84 124/76  Pulse: 73 71    Resp: 18 15    Temp: 98 F (36.7 C) 98.1 F (36.7 C)    TempSrc: Oral Oral    SpO2: 100% 100%    Weight:      Height:        Intake/Output Summary (Last 24 hours) at 10/31/15 1220 Last data filed at 10/31/15 0749  Gross per 24 hour  Intake              480 ml  Output  400 ml  Net               80 ml   Filed Weights   10/30/15 1008 10/30/15 1623  Weight: 72.6 kg (160 lb) 68.9 kg (152 lb)    Examination:  General exam: Appears calm and comfortable  Respiratory system: Clear to auscultation. Respiratory effort normal. Cardiovascular system: S1 & S2 heard, RRR. No JVD, murmurs, rubs, gallops or  clicks. No pedal edema. Gastrointestinal system: Abdomen is nondistended, soft and nontender. No organomegaly or masses felt. Normal bowel sounds heard. Central nervous system: Alert and oriented. No focal neurological deficits. Extremities: Symmetric 5 x 5 power. Skin: No rashes, lesions or ulcers Psychiatry: Judgement and insight appear normal. Mood & affect appropriate.     Data Reviewed: I have personally reviewed following labs and imaging studies  CBC:  Recent Labs Lab 10/30/15 1042 10/30/15 2155  WBC 3.8* 4.5  NEUTROABS 2.2  --   HGB 12.4* 12.5*  HCT 37.4* 38.1*  MCV 78.9 79.7  PLT 168 [id number]   Basic Metabolic Panel:  Recent Labs Lab 10/30/15 1042 10/31/15 0307  NA 133* 141  K 4.0 4.0  CL 100* 107  CO2 26 27  GLUCOSE 458* 211*  BUN 17 21*  CREATININE 1.50* 1.33*  CALCIUM 9.4 9.0   GFR: Estimated Creatinine Clearance: 54.3 mL/min (by C-G formula based on SCr of 1.33 mg/dL). Liver Function Tests:  Recent Labs Lab 10/30/15 1042  AST 24  ALT 23  ALKPHOS 59  BILITOT 0.4  PROT 6.5  ALBUMIN 3.4*    Recent Labs Lab 10/30/15 1042  LIPASE 26   No results for input(s): AMMONIA in the last 168 hours. Coagulation Profile: No results for input(s): INR, PROTIME in the last 168 hours. Cardiac Enzymes:  Recent Labs Lab 10/30/15 2155  TROPONINI <0.03   BNP (last 3 results) No results for input(s): PROBNP in the last 8760 hours. HbA1C:  Recent Labs  10/30/15 1615  HGBA1C 9.6*   CBG:  Recent Labs Lab 10/30/15 1329 10/30/15 1635 10/30/15 2114 10/31/15 0612 10/31/15 1116  GLUCAP 367* 150* 264* 175* 157*   Lipid Profile:  Recent Labs  10/31/15 0307  CHOL 162  HDL 43  LDLCALC 102*  TRIG 87  CHOLHDL 3.8   Thyroid Function Tests: No results for input(s): TSH, T4TOTAL, FREET4, T3FREE, THYROIDAB in the last 72 hours. Anemia Panel: No results for input(s): VITAMINB12, FOLATE, FERRITIN, TIBC, IRON, RETICCTPCT in the last 72 hours. Sepsis  Labs: No results for input(s): PROCALCITON, LATICACIDVEN in the last 168 hours.  No results found for this or any previous visit (from the past 240 hour(s)).       Radiology Studies: Dg Chest 2 View  Result Date: 10/30/2015 CLINICAL DATA:  Right side rib pain ongoing for 2 weeks. EXAM: CHEST  2 VIEW COMPARISON:  10/22/2015 FINDINGS: Heart and mediastinal contours are within normal limits. No focal opacities or effusions. No acute bony abnormality. Radiopaque BB projects over the right chest, stable. IMPRESSION: No active cardiopulmonary disease. Electronically Signed   By: Rolm Baptise M.D.   On: 10/30/2015 11:20        Scheduled Meds: . amLODipine  5 mg Oral Daily  . aspirin  325 mg Oral Daily  . atorvastatin  10 mg Oral q1800  . enoxaparin (LOVENOX) injection  40 mg Subcutaneous Q24H  . insulin aspart  0-15 Units Subcutaneous TID WC  . insulin aspart  0-5 Units Subcutaneous QHS  . insulin glargine  15 Units Subcutaneous QHS  . metoprolol tartrate  25 mg Oral BID   Continuous Infusions: . sodium chloride 75 mL/hr at 10/31/15 0850     LOS: 0 days    Time spent: 93 minutes    Tehani Mersman, MD [facility] Pager [phone]  If 7PM-7AM, please contact night-coverage [email or web address] Password [email or web address] 10/31/2015, 12:20 PM

## 2015-10-31 NOTE — Care Management Note (Signed)
Case Management Note Marvetta Gibbons RN, BSN Unit 2W-Case Manager [phone]  Patient Details  Name: Richard Bennett MRN: [id number] Date of Birth: 10-12-1950  Subjective/Objective:  Pt admitted with HTN emergency                  Action/Plan: PTA pt lived at home- anticipate return home- referral received for medication assistance- spoke with pt at bedside- per conversation pt has a PCP at [facility] [place]- pt also reports that he was recently set up with [facility] and got his card which will be active starting Sept. 1- (pt does not have medication coverage otherwise)- pt would be eligible for [facility] assistance on discharge if prior to Friday to assist with medication to be sure he can get them on discharge. Program explained to pt and pt agreeable to the $3 per script cost. Per pt he uses [facility] on [address]. CM will f/u with pt prior to d/c and plan to assist with [facility] letter.    Expected Discharge Date:                  Expected Discharge Plan:  Home/Self Care  In-House Referral:     Discharge planning Services  CM Consult, Medication Assistance, [place] Program  Post Acute Care Choice:    Choice offered to:     DME Arranged:    DME Agency:     HH Arranged:    HH Agency:     Status of Service:  In process, will continue to follow  If discussed at Long Length of Stay Meetings, dates discussed:    Additional Comments:  Dawayne Patricia, RN 10/31/2015, 11:36 AM

## 2015-11-01 ENCOUNTER — Inpatient Hospital Stay (HOSPITAL_COMMUNITY): Payer: Medicare Other

## 2015-11-01 DIAGNOSIS — E119 Type 2 diabetes mellitus without complications: Secondary | ICD-10-CM

## 2015-11-01 DIAGNOSIS — N179 Acute kidney failure, unspecified: Principal | ICD-10-CM

## 2015-11-01 DIAGNOSIS — I161 Hypertensive emergency: Secondary | ICD-10-CM

## 2015-11-01 DIAGNOSIS — I10 Essential (primary) hypertension: Secondary | ICD-10-CM

## 2015-11-01 DIAGNOSIS — E871 Hypo-osmolality and hyponatremia: Secondary | ICD-10-CM

## 2015-11-01 DIAGNOSIS — R079 Chest pain, unspecified: Secondary | ICD-10-CM

## 2015-11-01 LAB — BASIC METABOLIC PANEL
Anion gap: 7 (ref 5–15)
BUN: 19 mg/dL (ref 6–20)
CALCIUM: 8.7 mg/dL — AB (ref 8.9–10.3)
CO2: 25 mmol/L (ref 22–32)
CREATININE: 1.18 mg/dL (ref 0.61–1.24)
Chloride: 108 mmol/L (ref 101–111)
GFR calc Af Amer: >60 mL/min (ref >60)
GLUCOSE: 164 mg/dL — AB (ref 65–99)
Potassium: 3.6 mmol/L (ref 3.5–5.1)
Sodium: 140 mmol/L (ref 135–145)

## 2015-11-01 LAB — CBC
HCT: 34.8 % — ABNORMAL LOW (ref 39.0–52.0)
Hemoglobin: 11.2 g/dL — ABNORMAL LOW (ref 13.0–17.0)
MCH: 25.8 pg — AB (ref 26.0–34.0)
MCHC: 32.2 g/dL (ref 30.0–36.0)
MCV: 80.2 fL (ref 78.0–100.0)
PLATELETS: 175 10*3/uL (ref 150–400)
RBC: 4.34 MIL/uL (ref 4.22–5.81)
RDW: 13.1 % (ref 11.5–15.5)
WBC: 4.1 10*3/uL (ref 4.0–10.5)

## 2015-11-01 LAB — GLUCOSE, CAPILLARY
GLUCOSE-CAPILLARY: 169 mg/dL — AB (ref 65–99)
GLUCOSE-CAPILLARY: 273 mg/dL — AB (ref 65–99)
Glucose-Capillary: 149 mg/dL — ABNORMAL HIGH (ref 65–99)
Glucose-Capillary: 140 mg/dL — ABNORMAL HIGH (ref 65–99)

## 2015-11-01 LAB — ECHOCARDIOGRAM COMPLETE
Height: 68 in
Weight: 2432 oz

## 2015-11-01 LAB — CK: Total CK: 52 U/L (ref 49–397)

## 2015-11-01 MED ORDER — INSULIN GLARGINE 100 UNIT/ML ~~LOC~~ SOLN
18.0000 [IU] | Freq: Every day | SUBCUTANEOUS | Status: DC
  Administered 2015-11-01: 18 [IU] via SUBCUTANEOUS
  Filled 2015-11-01 (×2): qty 0.18

## 2015-11-01 MED ORDER — LOSARTAN POTASSIUM 50 MG PO TABS
50.0000 mg | ORAL_TABLET | Freq: Every day | ORAL | Status: DC
  Administered 2015-11-01 – 2015-11-02 (×2): 50 mg via ORAL
  Filled 2015-11-01 (×2): qty 1

## 2015-11-01 MED ORDER — POTASSIUM CHLORIDE CRYS ER 20 MEQ PO TBCR
40.0000 meq | EXTENDED_RELEASE_TABLET | Freq: Once | ORAL | Status: AC
  Administered 2015-11-01: 40 meq via ORAL
  Filled 2015-11-01: qty 2

## 2015-11-01 MED ORDER — HYDROCODONE-ACETAMINOPHEN 5-325 MG PO TABS
1.0000 | ORAL_TABLET | Freq: Four times a day (QID) | ORAL | Status: DC | PRN
  Administered 2015-11-01 – 2015-11-02 (×4): 1 via ORAL
  Filled 2015-11-01 (×4): qty 1

## 2015-11-01 NOTE — Progress Notes (Signed)
  Echocardiogram 2D Echocardiogram has been performed.  Richard Bennett 11/01/2015, 11:12 AM

## 2015-11-01 NOTE — Progress Notes (Signed)
PROGRESS NOTE    Richard Bennett  [id number] DOB: [birth year] DOA: 10/30/2015 PCP: Boyce Medici, FNP   Brief Narrative:  Per Dr Ruben Gottron is a 65 y.o. male with medical history significant for hypertension, diabetes, hyperlipidemia, noncompliance presents emergency Department chief complaint persistent worsening right anterior lower chest/rib pain. Initial evaluation revealled hypertensive emergency, acute kidney injury serum glucose 458. Information was obtained from the patient. He reported being in emergency department 3 days ago complaints of abdominal pain. Workup was negative and he was discharged. He stated since that time the pain has worsened and that his right rib feels "bruised". Described the pain as sharp achy constant nonradiating. He denied headache dizziness syncope or near-syncope. He denied any abdominal pain nausea vomiting or diarrhea. He denied cough shortness of breath lower strandy edema or orthopnea. He denied any fever chills recent travel or sick contacts. He denied dysuria hematuria frequency or urgency. He is unable to tell when his blood sugar as his he does not monitor  ED Course: In the emergency department blood pressure 173/101 he is afebrile not hypoxic. He is given labetalol  Subjective: Feels okay other than bilateral leg pain.  Assessment & Plan:   Principal Problem:   Hypertensive emergency Active Problems:   Pain in the chest   Essential hypertension   Hyperlipidemia   Diabetes mellitus without complication (HCC)   Hyponatremia   AKI (acute kidney injury) ([place])   Chest pain   Hypertensive emergency -Likely secondary to medical noncompliance although patient states he's been compliant with his medications.  -EKG consistent with LVH. Continue metoprolol. Norvasc 5 mg added. -This is improved, blood pressure is controlled but is still fluctuating. 2-D echo pending. -Diabetic, was on losartan and held because of acute kidney injury,  will restart.  Hyperlipidemia Fasting lipid panel with LDL of 102 total cholesterol of 162. Continue statin.  Chest pain Likely secondary to problem #1. Cardiac enzymes negative 3. Clinical improvement. Check a 2-D echo. Continue aspirin, Lipitor, metoprolol. If 2-D echo is normal outpatient follow-up.  Acute kidney injury Likely secondary to prerenal azotemia in the setting of ARB. ARB on hold. Renal function trending down. Continue gentle hydration.  Poorly controlled diabetes mellitus Hemoglobin A1c 9.6. Patient likely noncompliant. Patient stated he stopped Lantus on his salt as she had some feelings of dizziness. Patient with no signs of dizziness results laceration on a reduced dose of Lantus. Continue current dose of Lantus and sliding scale insulin.  Hyponatremia Resolved with hydration.  Legs pain -Patient mentioned bilateral leg pain, this is new. No swelling, no redness. -Check CPK, he is on statin. Check bilateral venous duplex. Ambulate.  DVT prophylaxis: Lovenox Code Status: Full Family Communication: Updated patient. No family present. Disposition Plan: Await 2-D echo and appears duplex results.   Consultants:   None  Procedures:   Chest x-ray 10/30/2015  Antimicrobials:   None    Objective: Vitals:   10/31/15 2052 10/31/15 2130 11/01/15 0615 11/01/15 0620  BP:  (!) 152/65  (!) 183/78  Pulse: 70 68 66   Resp: 18  18   Temp: 98.6 F (37 C)  98.5 F (36.9 C)   TempSrc: Oral  Oral   SpO2: 100%  100%   Weight:      Height:        Intake/Output Summary (Last 24 hours) at 11/01/15 1058 Last data filed at 11/01/15 0730  Gross per 24 hour  Intake  2190 ml  Output                0 ml  Net             2190 ml   Filed Weights   10/30/15 1008 10/30/15 1623  Weight: 72.6 kg (160 lb) 68.9 kg (152 lb)    Examination:  General exam: Appears calm and comfortable  Respiratory system: Clear to auscultation. Respiratory effort  normal. Cardiovascular system: S1 & S2 heard, RRR. No JVD, murmurs, rubs, gallops or clicks. No pedal edema. Gastrointestinal system: Abdomen is nondistended, soft and nontender. No organomegaly or masses felt. Normal bowel sounds heard. Central nervous system: Alert and oriented. No focal neurological deficits. Extremities: Symmetric 5 x 5 power. Skin: No rashes, lesions or ulcers Psychiatry: Judgement and insight appear normal. Mood & affect appropriate.     Data Reviewed: I have personally reviewed following labs and imaging studies  CBC:  Recent Labs Lab 10/30/15 1042 10/30/15 2155 11/01/15 0239  WBC 3.8* 4.5 4.1  NEUTROABS 2.2  --   --   HGB 12.4* 12.5* 11.2*  HCT 37.4* 38.1* 34.8*  MCV 78.9 79.7 80.2  PLT 168 189 0000000   Basic Metabolic Panel:  Recent Labs Lab 10/30/15 1042 10/31/15 0307 11/01/15 0239  NA 133* 141 140  K 4.0 4.0 3.6  CL 100* 107 108  CO2 26 27 25   GLUCOSE 458* 211* 164*  BUN 17 21* 19  CREATININE 1.50* 1.33* 1.18  CALCIUM 9.4 9.0 8.7*   GFR: Estimated Creatinine Clearance: 61.2 mL/min (by C-G formula based on SCr of 1.18 mg/dL). Liver Function Tests:  Recent Labs Lab 10/30/15 1042  AST 24  ALT 23  ALKPHOS 59  BILITOT 0.4  PROT 6.5  ALBUMIN 3.4*    Recent Labs Lab 10/30/15 1042  LIPASE 26   No results for input(s): AMMONIA in the last 168 hours. Coagulation Profile: No results for input(s): INR, PROTIME in the last 168 hours. Cardiac Enzymes:  Recent Labs Lab 10/30/15 2155  TROPONINI <0.03   BNP (last 3 results) No results for input(s): PROBNP in the last 8760 hours. HbA1C:  Recent Labs  10/30/15 1615  HGBA1C 9.6*   CBG:  Recent Labs Lab 10/31/15 1116 10/31/15 1649 10/31/15 2046 11/01/15 0609 11/01/15 1045  GLUCAP 157* 182* 170* 149* 169*   Lipid Profile:  Recent Labs  10/31/15 0307  CHOL 162  HDL 43  LDLCALC 102*  TRIG 87  CHOLHDL 3.8   Thyroid Function Tests: No results for input(s): TSH,  T4TOTAL, FREET4, T3FREE, THYROIDAB in the last 72 hours. Anemia Panel: No results for input(s): VITAMINB12, FOLATE, FERRITIN, TIBC, IRON, RETICCTPCT in the last 72 hours. Sepsis Labs: No results for input(s): PROCALCITON, LATICACIDVEN in the last 168 hours.  No results found for this or any previous visit (from the past 240 hour(s)).       Radiology Studies: Dg Chest 2 View  Result Date: 10/30/2015 CLINICAL DATA:  Right side rib pain ongoing for 2 weeks. EXAM: CHEST  2 VIEW COMPARISON:  10/22/2015 FINDINGS: Heart and mediastinal contours are within normal limits. No focal opacities or effusions. No acute bony abnormality. Radiopaque BB projects over the right chest, stable. IMPRESSION: No active cardiopulmonary disease. Electronically Signed   By: Rolm Baptise M.D.   On: 10/30/2015 11:20        Scheduled Meds: . amLODipine  5 mg Oral Daily  . aspirin  325 mg Oral Daily  . atorvastatin  10  mg Oral q1800  . enoxaparin (LOVENOX) injection  40 mg Subcutaneous Q24H  . insulin aspart  0-15 Units Subcutaneous TID WC  . insulin aspart  0-5 Units Subcutaneous QHS  . insulin glargine  15 Units Subcutaneous QHS  . metoprolol tartrate  25 mg Oral BID   Continuous Infusions:     LOS: 1 day    Time spent: 35 minutes    Adeoluwa Silvers A, MD [facility] Pager [phone]  If 7PM-7AM, please contact night-coverage [email or web address] Password [email or web address] 11/01/2015, 10:58 AM

## 2015-11-01 NOTE — Progress Notes (Signed)
Nutrition Consult/Brief Note  RD consulted for nutrition education regarding diabetes.   Lab Results  Component Value Date   HGBA1C 9.6 (H) 10/30/2015   CBG (last 3)   Recent Labs  10/31/15 2046 11/01/15 0609 11/01/15 1045  GLUCAP 170* 149* 169*    Patient reports he's received diet education from an RD before in [place] at an outpatient center and at his doctor's office.  This RD provided "Carbohydrate Counting for People with Diabetes" handout from the Academy of [employer] and Dietetics.  Provided list of carbohydrates and recommended serving sizes of common foods.  Discussed importance of controlled and consistent carbohydrate intake throughout the day. Provided examples of ways to balance meals/snacks and encouraged intake of high-fiber, whole grain complex carbohydrates. Teach back method used.  Expect fair compliance.  Body mass index is 23.11 kg/m. Pt meets criteria for Normal based on current BMI.  Current diet order is Heart Healthy, patient is consuming approximately 100% of meals at this time. Labs and medications reviewed. No further nutrition interventions warranted at this time. If additional nutrition issues arise, please re-consult RD.  Arthur Holms, RD, LDN Pager #: [phone] After-Hours Pager #: [phone]

## 2015-11-02 ENCOUNTER — Inpatient Hospital Stay (HOSPITAL_COMMUNITY): Payer: Medicare Other

## 2015-11-02 DIAGNOSIS — M7989 Other specified soft tissue disorders: Secondary | ICD-10-CM

## 2015-11-02 DIAGNOSIS — E785 Hyperlipidemia, unspecified: Secondary | ICD-10-CM

## 2015-11-02 LAB — GLUCOSE, CAPILLARY
GLUCOSE-CAPILLARY: 74 mg/dL (ref 65–99)
Glucose-Capillary: 150 mg/dL — ABNORMAL HIGH (ref 65–99)

## 2015-11-02 LAB — BASIC METABOLIC PANEL
Anion gap: 8 (ref 5–15)
BUN: 19 mg/dL (ref 6–20)
CHLORIDE: 108 mmol/L (ref 101–111)
CO2: 23 mmol/L (ref 22–32)
CREATININE: 1.02 mg/dL (ref 0.61–1.24)
Calcium: 8.9 mg/dL (ref 8.9–10.3)
GFR calc Af Amer: >60 mL/min (ref >60)
GFR calc non Af Amer: >60 mL/min (ref >60)
GLUCOSE: 130 mg/dL — AB (ref 65–99)
POTASSIUM: 4.4 mmol/L (ref 3.5–5.1)
SODIUM: 139 mmol/L (ref 135–145)

## 2015-11-02 MED ORDER — LANTUS SOLOSTAR 100 UNIT/ML ~~LOC~~ SOPN: 15.0000 [IU] | PEN_INJECTOR | Freq: Every morning | SUBCUTANEOUS | 3 refills | Status: DC

## 2015-11-02 MED ORDER — LOSARTAN POTASSIUM 50 MG PO TABS: 50.0000 mg | ORAL_TABLET | Freq: Every day | ORAL | 0 refills | Status: DC

## 2015-11-02 MED ORDER — METOPROLOL TARTRATE 25 MG PO TABS: 12.5000 mg | ORAL_TABLET | Freq: Two times a day (BID) | ORAL | 0 refills | Status: DC

## 2015-11-02 MED ORDER — AMLODIPINE BESYLATE 5 MG PO TABS: 5.0000 mg | ORAL_TABLET | Freq: Every day | ORAL | 0 refills | Status: DC

## 2015-11-02 NOTE — Discharge Summary (Signed)
Physician Discharge Summary  Richard Bennett [id number] DOB: August 12, 1950 DOA: 10/30/2015  PCP: Richard Medici, FNP  Admit date: 10/30/2015 Discharge date: 11/02/2015   Admitted From: Home Disposition: Home  Recommendations for Outpatient Follow-up:  1. Follow up with PCP in 1-2 weeks 2. Please obtain BMP/CBC in one week.  Home Health No Equipment/Devices: No Discharge Condition: Stable CODE STATUS: Full Diet recommendation: Carb Modified  Brief/Interim Summary: Richard Bennett is a 65 y.o. male with medical history significant for hypertension, diabetes, hyperlipidemia, noncompliance presents emergency Department chief complaint persistent worsening right anterior lower chest/rib pain. Initial evaluation reveals hypertensive emergency, acute kidney injury serum glucose 458.  Information is obtained from the patient. He reports being in emergency department 3 days ago complaints of abdominal pain. Workup was negative and he was discharged. He states since that time the pain has worsened and that his right rib feels "bruised". Describes the pain as sharp achy constant nonradiating. He denies headache dizziness syncope or near-syncope. He denies any abdominal pain nausea vomiting or diarrhea. He denies cough shortness of breath lower strandy edema or orthopnea. He denies any fever chills recent travel or sick contacts. He denies dysuria hematuria frequency or urgency. He is unable to tell me when his blood sugar as his he does not monitor   Discharge Diagnoses:  Principal Problem:   Hypertensive emergency Active Problems:   Pain in the chest   Essential hypertension   Hyperlipidemia   Diabetes mellitus without complication (HCC)   Hyponatremia   AKI (acute kidney injury) ([place])   Chest pain   Hypertensive emergency -Likely secondary to medical noncompliance although patient states he's been compliant with his medications.  -EKG consistent with LVH. Marland Kitchen -2-D echo showed severe LVH and  grade 1 diastolic dysfunction -Diabetic, was on losartan and held because of acute kidney injury, will restart. -Medication on discharge is losartan 50 mg, metoprolol 12.5 mg twice a day and Norvasc 5 mg daily. -Follow-up with primary care physician to adjust medications further.  Hyperlipidemia -Fasting lipid panel with LDL of 102 total cholesterol of 162. Continue statin.  Chest pain/RUQ abdominal pain -Likely secondary to problem #1. Cardiac enzymes negative 3.  -This is resolved, continue aspirin, Lipitor, added metoprolol 12.5 mg. -RUQ abdominal pain improved, ultrasound is negative.  Acute kidney injury Likely secondary to prerenal azotemia in the setting of ARB. Losartan held until renal failure resolved this is resolved with IV fluid hydration.  Poorly controlled diabetes mellitus -Hemoglobin A1c 9.6. Patient is noncompliant, reported he stopped his Lantus because he felt his blood sugar goes down. -Lantus insulin decreased to 15 units, patient follow-up with his primary care physician.  Hyponatremia -Resolved with hydration.  Legs pain -Patient mentioned bilateral leg pain, this is new. No swelling, no redness. -Check CPK, he is on statin. The lateral duplex preliminary report showed no evidence of DVT. -? If this is related to diabetic peripheral neuropathy.   Discharge Instructions  Discharge Instructions    Diet - low sodium heart healthy    Complete by:  As directed   Increase activity slowly    Complete by:  As directed       Medication List    TAKE these medications   amLODipine 5 MG tablet Commonly known as:  NORVASC Take 1 tablet (5 mg total) by mouth daily.   atorvastatin 10 MG tablet Commonly known as:  LIPITOR Take 10 mg by mouth at bedtime.   LANTUS SOLOSTAR 100 UNIT/ML Solostar Pen Generic drug:  Insulin  Glargine Inject 15 Units into the skin every morning. What changed:  how much to take   losartan 50 MG tablet Commonly known as:   COZAAR Take 1 tablet (50 mg total) by mouth daily. What changed:  medication strength  how much to take   metoprolol tartrate 25 MG tablet Commonly known as:  LOPRESSOR Take 0.5 tablets (12.5 mg total) by mouth 2 (two) times daily.   polyethylene glycol powder powder Commonly known as:  GLYCOLAX/MIRALAX Mix 17 grams with 8 ounces of water or juice and drink nightly      Follow-up Information    Richard Medici, FNP Follow up in 1 week(s).   Specialty:  Nurse Practitioner Contact information: [address] [place] [address] [phone]          No Known Allergies  Consultations:  None   Procedures/Studies: Dg Chest 2 View  Result Date: 10/30/2015 CLINICAL DATA:  Right side rib pain ongoing for 2 weeks. EXAM: CHEST  2 VIEW COMPARISON:  10/22/2015 FINDINGS: Heart and mediastinal contours are within normal limits. No focal opacities or effusions. No acute bony abnormality. Radiopaque BB projects over the right chest, stable. IMPRESSION: No active cardiopulmonary disease. Electronically Signed   By: Rolm Baptise M.D.   On: 10/30/2015 11:20   Dg Chest 2 View  Result Date: 10/22/2015 CLINICAL DATA:  Right-sided rib pain.  Abdominal pain for 6 months. EXAM: CHEST  2 VIEW COMPARISON:  None. FINDINGS: The heart size and mediastinal contours are within normal limits. Both lungs are clear. The visualized skeletal structures are unremarkable. There is a metallic focus within the soft tissues of the right chest wall. IMPRESSION: No active cardiopulmonary disease. Electronically Signed   By: Ulyses Jarred M.D.   On: 10/22/2015 05:28   US Abdomen Complete  Result Date: 10/22/2015 CLINICAL DATA:  Upper abdominal pain for 6 months EXAM: ABDOMEN ULTRASOUND COMPLETE COMPARISON:  CT abdomen/pelvis 09/11/2015 FINDINGS: Gallbladder: No positive sonographic Percell Miller sign was demonstrated by the sonographer. There is a 3.5 mm polyp within the gallbladder. Gallbladder wall thickness  measures 2.5 mm. No pericholecystic fluid. Common bile duct: Diameter: 3.9 mm Liver: No focal lesion identified. Within normal limits in parenchymal echogenicity. IVC: No abnormality visualized. Pancreas: Visualized portion unremarkable. Spleen: Size and appearance within normal limits. Right Kidney: Length: 10.4 cm. Echogenicity within normal limits. No mass or hydronephrosis visualized. Left Kidney: Length: 11.5 cm. Small cyst measuring 0.7 x 1.1 x 1.2 cm. Abdominal aorta: The midportion of the aorta is obscured by bowel gas. The measured diameter is 2.4 cm. Other findings: None. IMPRESSION: Normal abdominal ultrasound. Electronically Signed   By: Ulyses Jarred M.D.   On: 10/22/2015 05:57    (Echo, Carotid, EGD, Colonoscopy, ERCP)   2-D echo shows Study Conclusions  - Left ventricle: The cavity size was normal. Wall thickness was   increased in a pattern of severe LVH. Systolic function was   normal. The estimated ejection fraction was in the range of 60%   to 65%. Wall motion was normal; there were no regional wall   motion abnormalities. Doppler parameters are consistent with   abnormal left ventricular relaxation (grade 1 diastolic   dysfunction). - Right atrium: The atrium was mildly dilated.  Subjective:   Discharge Exam: Vitals:   11/02/15 0444 11/02/15 0957  BP: (!) 158/74 (!) 197/86  Pulse: 67 70  Resp: 18 16  Temp: 98.1 F (36.7 C) 98.2 F (36.8 C)   Vitals:   11/01/15 1338 11/01/15  2142 11/02/15 0444 11/02/15 0957  BP: (!) 166/85 (!) 172/78 (!) 158/74 (!) 197/86  Pulse: 65 72 67 70  Resp: 18 18 18 16   Temp: 98 F (36.7 C) 98.4 F (36.9 C) 98.1 F (36.7 C) 98.2 F (36.8 C)  TempSrc: Oral Oral Oral Oral  SpO2: 100% 100% 100% 100%  Weight:      Height:        General: Pt is alert, awake, not in acute distress Cardiovascular: RRR, S1/S2 +, no rubs, no gallops Respiratory: CTA bilaterally, no wheezing, no rhonchi Abdominal: Soft, NT, ND, bowel sounds  + Extremities: no edema, no cyanosis    The results of significant diagnostics from this hospitalization (including imaging, microbiology, ancillary and laboratory) are listed below for reference.     Microbiology: No results found for this or any previous visit (from the past 240 hour(s)).   Labs: BNP (last 3 results) No results for input(s): BNP in the last 8760 hours. Basic Metabolic Panel:  Recent Labs Lab 10/30/15 1042 10/31/15 0307 11/01/15 0239 11/02/15 0127  NA 133* 141 140 139  K 4.0 4.0 3.6 4.4  CL 100* 107 108 108  CO2 26 27 25 23   GLUCOSE 458* 211* 164* 130*  BUN 17 21* 19 19  CREATININE 1.50* 1.33* 1.18 1.02  CALCIUM 9.4 9.0 8.7* 8.9   Liver Function Tests:  Recent Labs Lab 10/30/15 1042  AST 24  ALT 23  ALKPHOS 59  BILITOT 0.4  PROT 6.5  ALBUMIN 3.4*    Recent Labs Lab 10/30/15 1042  LIPASE 26   No results for input(s): AMMONIA in the last 168 hours. CBC:  Recent Labs Lab 10/30/15 1042 10/30/15 2155 11/01/15 0239  WBC 3.8* 4.5 4.1  NEUTROABS 2.2  --   --   HGB 12.4* 12.5* 11.2*  HCT 37.4* 38.1* 34.8*  MCV 78.9 79.7 80.2  PLT 168 189 175   Cardiac Enzymes:  Recent Labs Lab 10/30/15 2155 11/01/15 1217  CKTOTAL  --  52  TROPONINI <0.03  --    BNP: Invalid input(s): POCBNP CBG:  Recent Labs Lab 11/01/15 1045 11/01/15 1618 11/01/15 2140 11/02/15 0619 11/02/15 1114  GLUCAP 169* 273* 140* 74 150*   D-Dimer No results for input(s): DDIMER in the last 72 hours. Hgb A1c  Recent Labs  10/30/15 1615  HGBA1C 9.6*   Lipid Profile  Recent Labs  10/31/15 0307  CHOL 162  HDL 43  LDLCALC 102*  TRIG 87  CHOLHDL 3.8   Thyroid function studies No results for input(s): TSH, T4TOTAL, T3FREE, THYROIDAB in the last 72 hours.  Invalid input(s): FREET3 Anemia work up No results for input(s): VITAMINB12, FOLATE, FERRITIN, TIBC, IRON, RETICCTPCT in the last 72 hours. Urinalysis    Component Value Date/Time    COLORURINE YELLOW 10/30/2015 [place] 10/30/2015 1138   LABSPEC 1.028 10/30/2015 1138   PHURINE 6.0 10/30/2015 1138   GLUCOSEU >1000 (A) 10/30/2015 1138   HGBUR NEGATIVE 10/30/2015 1138   BILIRUBINUR NEGATIVE 10/30/2015 1138   [place] 10/30/2015 1138   PROTEINUR >300 (A) 10/30/2015 1138   NITRITE NEGATIVE 10/30/2015 1138   LEUKOCYTESUR NEGATIVE 10/30/2015 1138   Sepsis Labs Invalid input(s): PROCALCITONIN,  WBC,  LACTICIDVEN Microbiology No results found for this or any previous visit (from the past 240 hour(s)).   Time coordinating discharge: Over 30 minutes  SIGNED:   Birdie Hopes, MD  [facility] 11/02/2015, 11:46 AM Pager   If 7PM-7AM, please contact night-coverage [email or web address]  Password [email or web address]

## 2015-11-02 NOTE — Progress Notes (Signed)
11/02/2015 1:13 PM Discharge AVS meds taken today and those due this evening reviewed.  Follow-up appointments and when to call md reviewed.  D/C IV and TELE.  Questions and concerns addressed.   D/C home per orders. [place]

## 2015-11-02 NOTE — Care Management Note (Signed)
Case Management Note Marvetta Gibbons RN, BSN Unit 2W-Case Manager [phone]  Patient Details  Name: Richard Bennett MRN: [id number] Date of Birth: 1951/01/12  Subjective/Objective:  Pt admitted with HTN emergency                  Action/Plan: PTA pt lived at home- anticipate return home- referral received for medication assistance- spoke with pt at bedside- per conversation pt has a PCP at [facility] [place]- pt also reports that he was recently set up with [facility] and got his card which will be active starting Sept. 1- (pt does not have medication coverage otherwise)- pt would be eligible for [facility] assistance on discharge if prior to Friday to assist with medication to be sure he can get them on discharge. Program explained to pt and pt agreeable to the $3 per script cost. Per pt he uses [facility] on [address]. CM will f/u with pt prior to d/c and plan to assist with [facility] letter.    Expected Discharge Date:    11/02/15              Expected Discharge Plan:  Home/Self Care  In-House Referral:     Discharge planning Services  CM Consult, Medication Assistance, [facility] Program  Post Acute Care Choice:    Choice offered to:     DME Arranged:    DME Agency:     HH Arranged:    HH Agency:     Status of Service:  Completed, signed off  If discussed at [employer], dates discussed:    Additional Comments:  11/02/15- 1215- Marvetta Gibbons RN,, CM- pt for d/c home today- will need assistance with medications will use [facility]- pt given [facility] letter at bedside and explained again about the program and copay cost- pt states that he can do the $3 per prescription cost and states he appreciates the help. Will be able to use his [employer] med assist going forward.   Dawayne Patricia, RN 11/02/2015, 12:14 PM

## 2015-11-02 NOTE — Progress Notes (Signed)
VASCULAR LAB PRELIMINARY  PRELIMINARY  PRELIMINARY  PRELIMINARY  Bilateral lower extremity venous duplex completed.    Preliminary report:  There is no DVT or SVT noted in the bilateral lower extremities.   Essam Lowdermilk, RVT 11/02/2015, 11:42 AM

## 2015-11-17 ENCOUNTER — Emergency Department (HOSPITAL_COMMUNITY): Payer: Medicare Other

## 2015-11-17 ENCOUNTER — Encounter (HOSPITAL_COMMUNITY): Payer: Self-pay | Admitting: *Deleted

## 2015-11-17 ENCOUNTER — Emergency Department (HOSPITAL_COMMUNITY)
Admission: EM | Admit: 2015-11-17 | Discharge: 2015-11-17 | Disposition: A | Discharge status: Home / Self Care | Payer: Medicare Other | Attending: Emergency Medicine | Admitting: Emergency Medicine

## 2015-11-17 DIAGNOSIS — R1013 Epigastric pain: Secondary | ICD-10-CM | POA: Diagnosis not present

## 2015-11-17 DIAGNOSIS — Z79899 Other long term (current) drug therapy: Secondary | ICD-10-CM | POA: Diagnosis not present

## 2015-11-17 DIAGNOSIS — I10 Essential (primary) hypertension: Secondary | ICD-10-CM | POA: Diagnosis not present

## 2015-11-17 DIAGNOSIS — E119 Type 2 diabetes mellitus without complications: Secondary | ICD-10-CM | POA: Diagnosis not present

## 2015-11-17 DIAGNOSIS — Z87891 Personal history of nicotine dependence: Secondary | ICD-10-CM | POA: Insufficient documentation

## 2015-11-17 DIAGNOSIS — R079 Chest pain, unspecified: Secondary | ICD-10-CM | POA: Diagnosis present

## 2015-11-17 LAB — COMPREHENSIVE METABOLIC PANEL
ALK PHOS: 55 U/L (ref 38–126)
ALT: 24 U/L (ref 17–63)
ANION GAP: 8 (ref 5–15)
AST: 26 U/L (ref 15–41)
Albumin: 3.2 g/dL — ABNORMAL LOW (ref 3.5–5.0)
BUN: 25 mg/dL — ABNORMAL HIGH (ref 6–20)
CALCIUM: 9.2 mg/dL (ref 8.9–10.3)
CO2: 26 mmol/L (ref 22–32)
CREATININE: 1.28 mg/dL — AB (ref 0.61–1.24)
Chloride: 105 mmol/L (ref 101–111)
GFR, EST NON AFRICAN AMERICAN: 58 mL/min — AB (ref >60)
Glucose, Bld: 190 mg/dL — ABNORMAL HIGH (ref 65–99)
Potassium: 4.1 mmol/L (ref 3.5–5.1)
Sodium: 139 mmol/L (ref 135–145)
Total Bilirubin: 0.4 mg/dL (ref 0.3–1.2)
Total Protein: 6 g/dL — ABNORMAL LOW (ref 6.5–8.1)

## 2015-11-17 LAB — CBC WITH DIFFERENTIAL/PLATELET
Basophils Absolute: 0 10*3/uL (ref 0.0–0.1)
Basophils Relative: 0 %
EOS PCT: 2 %
Eosinophils Absolute: 0.1 10*3/uL (ref 0.0–0.7)
HCT: 35.1 % — ABNORMAL LOW (ref 39.0–52.0)
HEMOGLOBIN: 11.4 g/dL — AB (ref 13.0–17.0)
LYMPHS ABS: 1.9 10*3/uL (ref 0.7–4.0)
LYMPHS PCT: 42 %
MCH: 26 pg (ref 26.0–34.0)
MCHC: 32.5 g/dL (ref 30.0–36.0)
MCV: 80 fL (ref 78.0–100.0)
MONOS PCT: 8 %
Monocytes Absolute: 0.4 10*3/uL (ref 0.1–1.0)
Neutro Abs: 2.2 10*3/uL (ref 1.7–7.7)
Neutrophils Relative %: 48 %
PLATELETS: 187 10*3/uL (ref 150–400)
RBC: 4.39 MIL/uL (ref 4.22–5.81)
RDW: 13.2 % (ref 11.5–15.5)
WBC: 4.6 10*3/uL (ref 4.0–10.5)

## 2015-11-17 LAB — I-STAT TROPONIN, ED: TROPONIN I, POC: 0 ng/mL (ref 0.00–0.08)

## 2015-11-17 MED ORDER — IOPAMIDOL (ISOVUE-370) INJECTION 76%
INTRAVENOUS | Status: AC
  Administered 2015-11-17: 100 mL
  Filled 2015-11-17: qty 100

## 2015-11-17 MED ORDER — METOPROLOL TARTRATE 25 MG PO TABS: 12.5000 mg | ORAL_TABLET | Freq: Two times a day (BID) | ORAL | 0 refills | Status: DC

## 2015-11-17 MED ORDER — LOSARTAN POTASSIUM 50 MG PO TABS
50.0000 mg | ORAL_TABLET | Freq: Every day | ORAL | 0 refills | Status: DC
Start: 2015-11-17 — End: 2017-05-13

## 2015-11-17 MED ORDER — GI COCKTAIL ~~LOC~~
30.0000 mL | Freq: Once | ORAL | Status: AC
  Administered 2015-11-17: 30 mL via ORAL
  Filled 2015-11-17: qty 30

## 2015-11-17 MED ORDER — PANTOPRAZOLE SODIUM 40 MG PO TBEC
40.0000 mg | DELAYED_RELEASE_TABLET | Freq: Once | ORAL | Status: AC
  Administered 2015-11-17: 40 mg via ORAL
  Filled 2015-11-17: qty 1

## 2015-11-17 MED ORDER — LANTUS SOLOSTAR 100 UNIT/ML ~~LOC~~ SOPN: 15.0000 [IU] | PEN_INJECTOR | Freq: Every morning | SUBCUTANEOUS | 3 refills | Status: DC

## 2015-11-17 MED ORDER — POLYETHYLENE GLYCOL 3350 17 GM/SCOOP PO POWD: 17.0000 g | Freq: Every day | ORAL | 4 refills | Status: DC | PRN

## 2015-11-17 MED ORDER — AMLODIPINE BESYLATE 5 MG PO TABS
5.0000 mg | ORAL_TABLET | Freq: Every day | ORAL | 0 refills | Status: DC
Start: 2015-11-17 — End: 2016-07-30

## 2015-11-17 MED ORDER — PANTOPRAZOLE SODIUM 40 MG PO TBEC: 40.0000 mg | DELAYED_RELEASE_TABLET | Freq: Every day | ORAL | 0 refills | Status: DC

## 2015-11-17 NOTE — ED Provider Notes (Signed)
[place] DEPT Provider Note   CSN: [id number] Arrival date & time: 11/17/15  [id number]     History   Chief Complaint Chief Complaint  Patient presents with  . Chest Pain    HPI Richard Bennett is a 65 y.o. male.  The history is provided by the patient.  Chest Pain    He comes in complaining of chest and abdominal pain. Pains have been present for about 9 months. It seems to be worse after eating. He describes his rib cage is swollen. When he wakes up in the morning, he states his intestines are backed up. Tonight, he went to bed without eating thinking that might help, but he woke up with pain which started in the right rib cage, SPO2 the mid abdomen, and then to the left chest. He rates pain at 9/10. There was mild nausea but no vomiting. He denies dyspnea or diaphoresis. He had been admitted to the hospital last month for evaluation. At this point, he is frustrated because of lack of diagnosis.  Past Medical History:  Diagnosis Date  . Chest pain   . Colon polyps    adenomatous  . Hyperlipidemia   . Hypertension   . Refusal of blood transfusions as patient is Jehovah's Witness   . Type II diabetes mellitus [facility])     Patient Active Problem List   Diagnosis Date Noted  . Chest pain 10/31/2015  . Hypertensive emergency 10/30/2015  . Hyponatremia 10/30/2015  . AKI (acute kidney injury) ([place]) 10/30/2015  . Pain in the chest   . Essential hypertension   . Hyperlipidemia   . Diabetes mellitus without complication [facility])     Past Surgical History:  Procedure Laterality Date  . APPENDECTOMY    . BACK SURGERY    . LUMBAR DISC SURGERY     "herniated"       Home Medications    Prior to Admission medications   Medication Sig Start Date End Date Taking? Authorizing Provider  amLODipine (NORVASC) 5 MG tablet Take 1 tablet (5 mg total) by mouth daily. 11/02/15   Verlee Monte, MD  atorvastatin (LIPITOR) 10 MG tablet Take 10 mg by mouth at bedtime. 08/15/15   Historical  Provider, MD  LANTUS SOLOSTAR 100 UNIT/ML Solostar Pen Inject 15 Units into the skin every morning. 11/02/15   Verlee Monte, MD  losartan (COZAAR) 50 MG tablet Take 1 tablet (50 mg total) by mouth daily. 11/02/15   Verlee Monte, MD  metoprolol tartrate (LOPRESSOR) 25 MG tablet Take 0.5 tablets (12.5 mg total) by mouth 2 (two) times daily. 11/02/15   Verlee Monte, MD  polyethylene glycol powder (GLYCOLAX/MIRALAX) powder Mix 17 grams with 8 ounces of water or juice and drink nightly 07/04/15   Historical Provider, MD    Family History Family History  Problem Relation Age of Onset  . Colon cancer Neg Hx   . Esophageal cancer Neg Hx   . Stomach cancer Neg Hx   . Rectal cancer Neg Hx     Social History Social History  Substance Use Topics  . Smoking status: Former Smoker    Packs/day: 0.50    Years: 20.00    Types: Cigarettes    Quit date: 03/04/1978  . Smokeless tobacco: Never Used  . Alcohol use Yes     Comment: 10/30/2015 "might have 2-3 drinks/year"     Allergies   Review of patient's allergies indicates no known allergies.   Review of Systems Review of Systems  Cardiovascular: Positive for  chest pain.  All other systems reviewed and are negative.    Physical Exam Updated Vital Signs BP 119/76 (BP Location: Left Arm)   Pulse 82   Temp 98.1 F (36.7 C) (Oral)   Resp 18   Ht 5\' 8"  (1.727 m)   Wt 152 lb (68.9 kg)   SpO2 100%   BMI 23.11 kg/m   Physical Exam  Nursing note and vitals reviewed.  65 year old male, resting comfortably and in no acute distress. Vital signs are normal. Oxygen saturation is 100%, which is normal. Head is normocephalic and atraumatic. PERRLA, EOMI. Oropharynx is clear. Neck is nontender and supple without adenopathy or JVD. Back is nontender and there is no CVA tenderness. Lungs are clear without rales, wheezes, or rhonchi. Chest is nontender. Heart has regular rate and rhythm without murmur. Abdomen is soft, flat, with mild epigastric  tenderness. There is no rebound or guarding. There are no masses or hepatosplenomegaly and peristalsis is normoactive. Extremities have no cyanosis or edema, full range of motion is present. Skin is warm and dry without rash. Neurologic: Mental status is normal, cranial nerves are intact, there are no motor or sensory deficits.  ED Treatments / Results  Labs (all labs ordered are listed, but only abnormal results are displayed) Labs Reviewed  CBC WITH DIFFERENTIAL/PLATELET - Abnormal; Notable for the following:       Result Value   Hemoglobin 11.4 (*)    HCT 35.1 (*)    All other components within normal limits  COMPREHENSIVE METABOLIC PANEL - Abnormal; Notable for the following:    Glucose, Bld 190 (*)    BUN 25 (*)    Creatinine, Ser 1.28 (*)    Total Protein 6.0 (*)    Albumin 3.2 (*)    GFR calc non Af Amer 58 (*)    All other components within normal limits  I-STAT TROPOININ, ED    EKG  EKG Interpretation  Date/Time:  Friday November 17 2015 03:31:31 EDT Ventricular Rate:  83 PR Interval:  146 QRS Duration: 106 QT Interval:  388 QTC Calculation: 455 R Axis:   75 Text Interpretation:  Normal sinus rhythm Normal ECG When compared with ECG of 10/30/2015, No significant change was found Confirmed by [facility]  MD, Cavon Nicolls ([id number]) on 11/17/2015 3:41:02 AM       Radiology Dg Chest 2 View  Result Date: 11/17/2015 CLINICAL DATA:  Bilateral chest and rib pain, onset this morning. EXAM: CHEST  2 VIEW COMPARISON:  10/30/2015 FINDINGS: The cardiomediastinal contours are normal. The lungs are clear. Pulmonary vasculature is normal. No consolidation, pleural effusion, or pneumothorax. Small chronic metallic foreign body in the right anterior chest wall. No acute osseous abnormalities are seen. IMPRESSION: No active cardiopulmonary disease. Electronically Signed   By: Jeb Levering M.D.   On: 11/17/2015 04:05   Ct Angio Chest Pe W And/or Wo Contrast  Result Date: 11/17/2015 CLINICAL  DATA:  Chest pain.  History of diabetes and hypertension. EXAM: CT ANGIOGRAPHY CHEST WITH CONTRAST TECHNIQUE: Multidetector CT imaging of the chest was performed using the standard protocol during bolus administration of intravenous contrast. Multiplanar CT image reconstructions and MIPs were obtained to evaluate the vascular anatomy. CONTRAST:  100 mL Isovue 370 IV COMPARISON:  Chest radiograph 11/17/2015 FINDINGS: Vascular: No pulmonary embolus. The main pulmonary artery is within normal limits for size. No CT evidence of acute right heart strain. Mild aortic atherosclerotic calcification. Heart size is normal. Mediastinum/Nodes: No mediastinal or axillary adenopathy. Lungs:  No pulmonary nodules or masses. No pleural effusion or focal consolidation. Visualized abdomen: Contrast bolus timing is not optimized for evaluation of the abdominal organs. Within this limitation, the visualized organs of the upper abdomen are normal. Musculoskeletal: No lytic or blastic osseous lesions. The visualized extrathoracic soft tissues are normal. Review of the MIP images confirms the above findings. IMPRESSION: 1. No pulmonary embolus. 2. No acute abnormality of the chest. Electronically Signed   By: Ulyses Jarred M.D.   On: 11/17/2015 06:46    Procedures Procedures (including critical care time)  Medications Ordered in ED Medications  pantoprazole (PROTONIX) EC tablet 40 mg (not administered)  gi cocktail (Maalox,Lidocaine,Donnatal) (30 mLs Oral Given 11/17/15 0509)  iopamidol (ISOVUE-370) 76 % injection (100 mLs  Contrast Given 11/17/15 [id number])     Initial Impression / Assessment and Plan / ED Course  I have reviewed the triage vital signs and the nursing notes.  Pertinent labs & imaging results that were available during my care of the patient were reviewed by me and considered in my medical decision making (see chart for details).  Clinical Course   Chest and abdominal pain of uncertain cause. Chronicity of  symptoms suggests a functional problem. Old records are reviewed confirming hospitalization 3 weeks ago for chest pain evaluation. He was found to have significant elevated blood pressure. CT scan of abdomen pelvis and abdominal ultrasound have been done within the past several months and have been unremarkable. Colonoscopy report is not on EPIC, but he was reported to be normal. Given epigastric tenderness, I will give him a trial of a GI cocktail. In an effort to be complete, we'll send for CT of the chest but I have a very low index of suspicion of any problems there.  Chest CT is unremarkable. He had excellent relief of pain with GI cocktail. At this point, I strongly suspect either GERD or peptic ulcer disease as the cause of his pain. I discussed this with the patient. He is discharged with prescription for pantoprazole. He is advised to recontact the physician he did his colonoscopy to consider upper endoscopy. Advised to use over-the-counter antacids as needed. Patient has requested refills on his other prescriptions and these were given.  Final Clinical Impressions(s) / ED Diagnoses   Final diagnoses:  Epigastric pain    New Prescriptions New Prescriptions   PANTOPRAZOLE (PROTONIX) 40 MG TABLET    Take 1 tablet (40 mg total) by mouth daily.     Delora Fuel, MD [id number]

## 2015-11-17 NOTE — ED Triage Notes (Signed)
Patient states he was here 2 weeks ago for the same.  C/o pain to the right side "around my appendix" and then fullness up to my right side an into my chest

## 2015-11-17 NOTE — ED Notes (Signed)
Pt BP noted to be high, pt reports he has been out of his blood pressure medication. New scripts written by MD at discharge.

## 2015-11-17 NOTE — ED Notes (Signed)
Pt to xray

## 2015-11-17 NOTE — Discharge Instructions (Signed)
Take an antacid like Tums, Maalox, Mylanta, or Pepto Bismol as needed. Follow up with the doctor who did you colonoscopy to see if an upper endoscopy would be indicated.

## 2015-12-08 ENCOUNTER — Encounter (HOSPITAL_COMMUNITY): Payer: Self-pay | Admitting: *Deleted

## 2015-12-08 ENCOUNTER — Emergency Department (HOSPITAL_COMMUNITY)
Admission: EM | Admit: 2015-12-08 | Discharge: 2015-12-08 | Disposition: A | Discharge status: Home / Self Care | Payer: Medicare Other | Attending: Physician Assistant | Admitting: Physician Assistant

## 2015-12-08 ENCOUNTER — Emergency Department (HOSPITAL_COMMUNITY): Payer: Medicare Other

## 2015-12-08 DIAGNOSIS — I10 Essential (primary) hypertension: Secondary | ICD-10-CM | POA: Diagnosis not present

## 2015-12-08 DIAGNOSIS — E119 Type 2 diabetes mellitus without complications: Secondary | ICD-10-CM | POA: Diagnosis not present

## 2015-12-08 DIAGNOSIS — K2970 Gastritis, unspecified, without bleeding: Secondary | ICD-10-CM | POA: Diagnosis not present

## 2015-12-08 DIAGNOSIS — Z7984 Long term (current) use of oral hypoglycemic drugs: Secondary | ICD-10-CM | POA: Insufficient documentation

## 2015-12-08 DIAGNOSIS — Z87891 Personal history of nicotine dependence: Secondary | ICD-10-CM | POA: Insufficient documentation

## 2015-12-08 DIAGNOSIS — R109 Unspecified abdominal pain: Secondary | ICD-10-CM | POA: Diagnosis present

## 2015-12-08 LAB — BASIC METABOLIC PANEL
Anion gap: 9 (ref 5–15)
BUN: 22 mg/dL — AB (ref 6–20)
CALCIUM: 9.3 mg/dL (ref 8.9–10.3)
CHLORIDE: 105 mmol/L (ref 101–111)
CO2: 25 mmol/L (ref 22–32)
CREATININE: 1.29 mg/dL — AB (ref 0.61–1.24)
GFR calc non Af Amer: 57 mL/min — ABNORMAL LOW (ref >60)
Glucose, Bld: 150 mg/dL — ABNORMAL HIGH (ref 65–99)
Potassium: 4.1 mmol/L (ref 3.5–5.1)
SODIUM: 139 mmol/L (ref 135–145)

## 2015-12-08 LAB — I-STAT TROPONIN, ED: TROPONIN I, POC: 0.01 ng/mL (ref 0.00–0.08)

## 2015-12-08 LAB — CBC
HCT: 37.5 % — ABNORMAL LOW (ref 39.0–52.0)
Hemoglobin: 11.9 g/dL — ABNORMAL LOW (ref 13.0–17.0)
MCH: 25.9 pg — AB (ref 26.0–34.0)
MCHC: 31.7 g/dL (ref 30.0–36.0)
MCV: 81.5 fL (ref 78.0–100.0)
Platelets: 217 10*3/uL (ref 150–400)
RBC: 4.6 MIL/uL (ref 4.22–5.81)
RDW: 13.4 % (ref 11.5–15.5)
WBC: 5.1 10*3/uL (ref 4.0–10.5)

## 2015-12-08 LAB — LIPASE, BLOOD: LIPASE: 32 U/L (ref 11–51)

## 2015-12-08 MED ORDER — PANTOPRAZOLE SODIUM 20 MG PO TBEC: 20.0000 mg | DELAYED_RELEASE_TABLET | Freq: Every day | ORAL | 0 refills | Status: DC

## 2015-12-08 MED ORDER — OXYCODONE-ACETAMINOPHEN 5-325 MG PO TABS
1.0000 | ORAL_TABLET | Freq: Once | ORAL | Status: AC
  Administered 2015-12-08: 1 via ORAL
  Filled 2015-12-08: qty 1

## 2015-12-08 NOTE — ED Provider Notes (Addendum)
[place] DEPT Provider Note   CSN: [id number] Arrival date & time: 12/08/15  0902     History   Chief Complaint Chief Complaint  Patient presents with  . Flank Pain    HPI Richard Bennett is a 65 y.o. male.  HPI   Patient is a 65 year old male presenting with bilateral chest wall symptoms. Patient says "my ribs hurt". He reports "swelling to my ribs and stomach and numbness to my liver" When asked to elaborate patient says that both the right side and left sides of his ribs hurt and feel "heavy". Patient also says that he felt like he felt something wiggling in his right upper quadrant/flank. This is since resolved. It happened for roughly 10 seconds in the evening last night.  She denies any cough, congestion. Denies any chest pain. The pain is located mid axillary line bilaterally. It has been ongoing since yesterday.  Patient asking for pain medication.  Past Medical History:  Diagnosis Date  . Chest pain   . Colon polyps    adenomatous  . Hyperlipidemia   . Hypertension   . Refusal of blood transfusions as patient is Jehovah's Witness   . Type II diabetes mellitus [facility])     Patient Active Problem List   Diagnosis Date Noted  . Chest pain 10/31/2015  . Hypertensive emergency 10/30/2015  . Hyponatremia 10/30/2015  . AKI (acute kidney injury) ([place]) 10/30/2015  . Pain in the chest   . Essential hypertension   . Hyperlipidemia   . Diabetes mellitus without complication [facility])     Past Surgical History:  Procedure Laterality Date  . APPENDECTOMY    . BACK SURGERY    . LUMBAR DISC SURGERY     "herniated"       Home Medications    Prior to Admission medications   Medication Sig Start Date End Date Taking? Authorizing Provider  amLODipine (NORVASC) 5 MG tablet Take 1 tablet (5 mg total) by mouth daily. [id number]  Yes Delora Fuel, MD  LANTUS SOLOSTAR 100 UNIT/ML Solostar Pen Inject 15 Units into the skin every morning. [id number]  Yes Delora Fuel, MD    losartan (COZAAR) 50 MG tablet Take 1 tablet (50 mg total) by mouth daily. [id number]  Yes Delora Fuel, MD  metoprolol tartrate (LOPRESSOR) 25 MG tablet Take 0.5 tablets (12.5 mg total) by mouth 2 (two) times daily. [id number]  Yes Delora Fuel, MD  pantoprazole (PROTONIX) 40 MG tablet Take 1 tablet (40 mg total) by mouth daily. [id number]  Yes Delora Fuel, MD  polyethylene glycol powder (GLYCOLAX/MIRALAX) powder Take 17 g by mouth daily as needed for mild constipation. Mix 17 grams with 8 ounces of water or juice and drink nightly [id number]  Yes Delora Fuel, MD    Family History Family History  Problem Relation Age of Onset  . Colon cancer Neg Hx   . Esophageal cancer Neg Hx   . Stomach cancer Neg Hx   . Rectal cancer Neg Hx     Social History Social History  Substance Use Topics  . Smoking status: Former Smoker    Packs/day: 0.50    Years: 20.00    Types: Cigarettes    Quit date: 03/04/1978  . Smokeless tobacco: Never Used  . Alcohol use Yes     Comment: 10/30/2015 "might have 2-3 drinks/year"     Allergies   Review of patient's allergies indicates no known allergies.   Review of Systems Review of Systems  Constitutional: Negative for fatigue and fever.  Cardiovascular: Negative for chest pain.  Gastrointestinal: Negative for nausea and vomiting.  Genitourinary: Positive for flank pain.  All other systems reviewed and are negative.    Physical Exam Updated Vital Signs BP 169/86   Pulse 73   Temp 97.9 F (36.6 C) (Oral)   Resp 22   SpO2 100%   Physical Exam  Constitutional: He is oriented to person, place, and time. He appears well-developed and well-nourished.  HENT:  Head: Normocephalic and atraumatic.  Eyes: Conjunctivae are normal.  Neck: Neck supple.  Cardiovascular: Normal rate and regular rhythm.   No murmur heard. Pulmonary/Chest: Effort normal and breath sounds normal. No respiratory distress.  Abdominal: Soft. There is no tenderness.  Musculoskeletal: He  exhibits no edema.  Neurological: He is alert and oriented to person, place, and time.  Skin: Skin is warm and dry.  No rash or vessicles  Psychiatric: He has a normal mood and affect.  Nursing note and vitals reviewed.    ED Treatments / Results  Labs (all labs ordered are listed, but only abnormal results are displayed) Labs Reviewed  CBC - Abnormal; Notable for the following:       Result Value   Hemoglobin 11.9 (*)    HCT 37.5 (*)    MCH 25.9 (*)    All other components within normal limits  BASIC METABOLIC PANEL - Abnormal; Notable for the following:    Glucose, Bld 150 (*)    BUN 22 (*)    Creatinine, Ser 1.29 (*)    GFR calc non Af Amer 57 (*)    All other components within normal limits  LIPASE, BLOOD  URINALYSIS, ROUTINE W REFLEX MICROSCOPIC (NOT AT [facility])  Randolm Idol, ED    EKG  EKG Interpretation None       Radiology Dg Chest 2 View  Result Date: 12/08/2015 CLINICAL DATA:  Anterior chest pain for 1 week. EXAM: CHEST  2 VIEW COMPARISON:  Radiographs and CT 11/17/2015. FINDINGS: The heart size and mediastinal contours are normal. The lungs are clear. There is no pleural effusion or pneumothorax. No acute osseous findings are identified. Metallic foreign body in the anterior right chest wall and prominent nipple shadows are again noted bilaterally. IMPRESSION: Stable chest.  No active cardiopulmonary process. Electronically Signed   By: Richardean Sale M.D.   On: 12/08/2015 10:34    Procedures Procedures (including critical care time)  Medications Ordered in ED Medications  oxyCODONE-acetaminophen (PERCOCET/ROXICET) 5-325 MG per tablet 1 tablet (1 tablet Oral Given 12/08/15 1025)     Initial Impression / Assessment and Plan / ED Course  I have reviewed the triage vital signs and the nursing notes.  Pertinent labs & imaging results that were available during my care of the patient were reviewed by me and considered in my medical decision making (see  chart for details).  Clinical Course    Patient is a 65 year old male presenting with a straight completion of symptoms. He reports bilateral midaxillary pain on his lower 3-4 ribs. Patient denies any nausea vomiting diarrhea coughing fever or shortness of breath. We will do chest x-ray to make sure patient does not have an occult pneumonia. He reports that he has been able to eat less than usual.  Otherwise patient has completely normal vital signs and normal physical exam. We'll give precautions looking for rash if it appears. (zoster)   Otherwise have a clear etiology for bilateral maxillary pain in the ribs. Or the wiggling sensation in his liver.  FAST BEDSIDE US Indication: RUQ pain  4 Views obtained: Splenorenal, Morrison's Pouch, Retrovesical, Pericardial No free fluid in abdomen No pericardial effusion No difficulty obtaining views. Archived electronically I personally performed and interrepreted the images  Inserted patient that he may have gastritis or peptic ulcer disease. We'll treat her with omeprazole. Patient states "that causes cancer". We'll have him follow-up with GI provide him with a prescription.    Final Clinical Impressions(s) / ED Diagnoses   Final diagnoses:  None    New Prescriptions New Prescriptions   No medications on file     Courteney Julio Alm, MD 12/08/15 [place], MD 12/08/15 1248

## 2015-12-08 NOTE — ED Notes (Signed)
Patient transported to X-ray 

## 2015-12-08 NOTE — Discharge Instructions (Signed)
We are unsure what is causing your symptoms. Please follow-up with your primary care physician and GI.

## 2015-12-08 NOTE — ED Triage Notes (Addendum)
Pt reports right side flank pain that started yesterday. Reports "feeling something move on right side"  Pt thinks its related to his liver. denies urinary symptoms. Denies n/v.

## 2015-12-08 NOTE — ED Notes (Signed)
Pt returned from xray

## 2015-12-08 NOTE — ED Notes (Signed)
Papers reviewed with patient and he verbalizes intent to follow up with the gastroenterologist. Leaving ambulatory and alone today

## 2015-12-16 ENCOUNTER — Emergency Department (HOSPITAL_COMMUNITY): Payer: Medicare Other

## 2015-12-16 ENCOUNTER — Encounter (HOSPITAL_COMMUNITY): Payer: Self-pay | Admitting: *Deleted

## 2015-12-16 ENCOUNTER — Emergency Department (HOSPITAL_COMMUNITY)
Admission: EM | Admit: 2015-12-16 | Discharge: 2015-12-16 | Disposition: A | Discharge status: Home / Self Care | Payer: Medicare Other | Attending: Emergency Medicine | Admitting: Emergency Medicine

## 2015-12-16 DIAGNOSIS — R1010 Upper abdominal pain, unspecified: Secondary | ICD-10-CM | POA: Diagnosis not present

## 2015-12-16 DIAGNOSIS — G8929 Other chronic pain: Secondary | ICD-10-CM | POA: Diagnosis not present

## 2015-12-16 DIAGNOSIS — R0602 Shortness of breath: Secondary | ICD-10-CM | POA: Insufficient documentation

## 2015-12-16 DIAGNOSIS — E119 Type 2 diabetes mellitus without complications: Secondary | ICD-10-CM | POA: Diagnosis not present

## 2015-12-16 DIAGNOSIS — I10 Essential (primary) hypertension: Secondary | ICD-10-CM | POA: Insufficient documentation

## 2015-12-16 DIAGNOSIS — Z79899 Other long term (current) drug therapy: Secondary | ICD-10-CM | POA: Diagnosis not present

## 2015-12-16 DIAGNOSIS — Z87891 Personal history of nicotine dependence: Secondary | ICD-10-CM | POA: Diagnosis not present

## 2015-12-16 LAB — URINE MICROSCOPIC-ADD ON
BACTERIA UA: NONE SEEN
RBC / HPF: NONE SEEN RBC/hpf (ref 0–5)

## 2015-12-16 LAB — CBC WITH DIFFERENTIAL/PLATELET
Basophils Absolute: 0 10*3/uL (ref 0.0–0.1)
Basophils Relative: 1 %
EOS ABS: 0.1 10*3/uL (ref 0.0–0.7)
EOS PCT: 3 %
HCT: 36.4 % — ABNORMAL LOW (ref 39.0–52.0)
HEMOGLOBIN: 12 g/dL — AB (ref 13.0–17.0)
Lymphocytes Relative: 38 %
Lymphs Abs: 1.6 10*3/uL (ref 0.7–4.0)
MCH: 26.3 pg (ref 26.0–34.0)
MCHC: 33 g/dL (ref 30.0–36.0)
MCV: 79.6 fL (ref 78.0–100.0)
MONO ABS: 0.3 10*3/uL (ref 0.1–1.0)
Monocytes Relative: 8 %
NEUTROS ABS: 2.2 10*3/uL (ref 1.7–7.7)
Neutrophils Relative %: 50 %
PLATELETS: 217 10*3/uL (ref 150–400)
RBC: 4.57 MIL/uL (ref 4.22–5.81)
RDW: 13.7 % (ref 11.5–15.5)
WBC: 4.3 10*3/uL (ref 4.0–10.5)

## 2015-12-16 LAB — COMPREHENSIVE METABOLIC PANEL
ALK PHOS: 56 U/L (ref 38–126)
ALT: 22 U/L (ref 17–63)
ANION GAP: 10 (ref 5–15)
AST: 30 U/L (ref 15–41)
Albumin: 3.6 g/dL (ref 3.5–5.0)
BUN: 14 mg/dL (ref 6–20)
CALCIUM: 9.3 mg/dL (ref 8.9–10.3)
CO2: 25 mmol/L (ref 22–32)
Chloride: 106 mmol/L (ref 101–111)
Creatinine, Ser: 1.21 mg/dL (ref 0.61–1.24)
GFR calc non Af Amer: >60 mL/min (ref >60)
Glucose, Bld: 127 mg/dL — ABNORMAL HIGH (ref 65–99)
Potassium: 4.1 mmol/L (ref 3.5–5.1)
SODIUM: 141 mmol/L (ref 135–145)
TOTAL PROTEIN: 6.4 g/dL — AB (ref 6.5–8.1)
Total Bilirubin: 0.6 mg/dL (ref 0.3–1.2)

## 2015-12-16 LAB — LIPASE, BLOOD: LIPASE: 39 U/L (ref 11–51)

## 2015-12-16 LAB — URINALYSIS, ROUTINE W REFLEX MICROSCOPIC
Bilirubin Urine: NEGATIVE
GLUCOSE, UA: NEGATIVE mg/dL
HGB URINE DIPSTICK: NEGATIVE
KETONES UR: NEGATIVE mg/dL
LEUKOCYTES UA: NEGATIVE
Nitrite: NEGATIVE
Specific Gravity, Urine: 1.018 (ref 1.005–1.030)
pH: 6 (ref 5.0–8.0)

## 2015-12-16 LAB — I-STAT TROPONIN, ED: Troponin i, poc: 0.01 ng/mL (ref 0.00–0.08)

## 2015-12-16 LAB — BRAIN NATRIURETIC PEPTIDE: B Natriuretic Peptide: 97.3 pg/mL (ref 0.0–100.0)

## 2015-12-16 MED ORDER — MORPHINE SULFATE (PF) 4 MG/ML IV SOLN
4.0000 mg | Freq: Once | INTRAVENOUS | Status: AC
  Administered 2015-12-16: 4 mg via INTRAMUSCULAR

## 2015-12-16 MED ORDER — SUCRALFATE 1 G PO TABS: 1.0000 g | ORAL_TABLET | Freq: Three times a day (TID) | ORAL | 0 refills | Status: DC

## 2015-12-16 MED ORDER — MORPHINE SULFATE (PF) 4 MG/ML IV SOLN
4.0000 mg | Freq: Once | INTRAVENOUS | Status: DC
  Filled 2015-12-16: qty 1

## 2015-12-16 MED ORDER — GI COCKTAIL ~~LOC~~: 30.0000 mL | Freq: Two times a day (BID) | ORAL | 0 refills | Status: DC | PRN

## 2015-12-16 MED ORDER — DICYCLOMINE HCL 20 MG PO TABS: 20.0000 mg | ORAL_TABLET | Freq: Two times a day (BID) | ORAL | 0 refills | Status: DC | PRN

## 2015-12-16 MED ORDER — GI COCKTAIL ~~LOC~~
30.0000 mL | Freq: Once | ORAL | Status: AC
  Administered 2015-12-16: 30 mL via ORAL
  Filled 2015-12-16: qty 30

## 2015-12-16 NOTE — ED Notes (Signed)
Pt. Very rude when asking what his symptoms are and his pain levels.  Pt. Stated, "You need to look it up what I take for pain ."  When offered tylenol or Ibuprofen, pt. Stated, "They don't help ." "Im not taking that."  'I want pain medicine."

## 2015-12-16 NOTE — Discharge Instructions (Signed)
Read the information below.  Use the prescribed medication as directed.  Please discuss all new medications with your pharmacist.  You may return to the Emergency Department at any time for worsening condition or any new symptoms that concern you.   If you develop high fevers, worsening abdominal pain, uncontrolled vomiting, or are unable to tolerate fluids by mouth, return to the ER for a recheck.  ° °

## 2015-12-16 NOTE — ED Triage Notes (Signed)
Patient presents with c/o abd pain and chest pain.  Stated he had a colonoscopy several months ago and the last 10 days has been SOB with CP and abd pain

## 2015-12-16 NOTE — ED Notes (Signed)
Pt. Is aware of needing an urine specimen 

## 2015-12-16 NOTE — ED Provider Notes (Signed)
[facility] DEPT Provider Note   CSN: [id number] Arrival date & time: 12/16/15  0610     History   Chief Complaint Chief Complaint  Patient presents with  . Chest Pain  . Shortness of Breath    HPI Richard Bennett is a 65 y.o. male.  HPI   Patient presents with chest and abdominal pain that has been constant for many months since just prior to time of his colonoscopy (July or August 2017, [facility] Dr Henrene Pastor).  Patient reports constant soreness in his ribs, sharp pains all over his abdomen, and sensation that is similar to scar tissue over the entire front of his torso.  The only time he does not hurt is when he only eats 1/2 meal for the entire day.  Eating causes him to feel very full and feels like his abdomen expands into his chest wall and hurts his ribs.  Has been seen in the ED multiple times with similar complaints.  States he has a GI appt in 6 days.  Is taking the prescribed antacids given during prior ED visits but denies taking anything else.  States he came to the ED today because his pain was so severe, states he only comes to ED when the pain gets this severe.    Past Medical History:  Diagnosis Date  . Chest pain   . Colon polyps    adenomatous  . Hyperlipidemia   . Hypertension   . Refusal of blood transfusions as patient is Jehovah's Witness   . Type II diabetes mellitus [facility])     Patient Active Problem List   Diagnosis Date Noted  . Chest pain 10/31/2015  . Hypertensive emergency 10/30/2015  . Hyponatremia 10/30/2015  . AKI (acute kidney injury) ([place]) 10/30/2015  . Pain in the chest   . Essential hypertension   . Hyperlipidemia   . Diabetes mellitus without complication [facility])     Past Surgical History:  Procedure Laterality Date  . APPENDECTOMY    . BACK SURGERY    . LUMBAR DISC SURGERY     "herniated"       Home Medications    Prior to Admission medications   Medication Sig Start Date End Date Taking? Authorizing Provider  Alum & Mag  Hydroxide-Simeth (GI COCKTAIL) SUSP suspension Take 30 mLs by mouth 2 (two) times daily as needed for indigestion (upper abdominal pain). Shake well. 12/16/15   Clayton Bibles, PA-C  amLODipine (NORVASC) 5 MG tablet Take 1 tablet (5 mg total) by mouth daily. [id number]   Delora Fuel, MD  LANTUS SOLOSTAR 100 UNIT/ML Solostar Pen Inject 15 Units into the skin every morning. [id number]   Delora Fuel, MD  losartan (COZAAR) 50 MG tablet Take 1 tablet (50 mg total) by mouth daily. [id number]   Delora Fuel, MD  metoprolol tartrate (LOPRESSOR) 25 MG tablet Take 0.5 tablets (12.5 mg total) by mouth 2 (two) times daily. [id number]   Delora Fuel, MD  pantoprazole (PROTONIX) 20 MG tablet Take 1 tablet (20 mg total) by mouth daily. 12/08/15   Courteney Lyn Mackuen, MD  pantoprazole (PROTONIX) 40 MG tablet Take 1 tablet (40 mg total) by mouth daily. [id number]   Delora Fuel, MD  polyethylene glycol powder (GLYCOLAX/MIRALAX) powder Take 17 g by mouth daily as needed for mild constipation. Mix 17 grams with 8 ounces of water or juice and drink nightly [id number]   Delora Fuel, MD  sucralfate (CARAFATE) 1 g tablet Take 1 tablet (1 g total) by mouth 4 (four)  times daily -  with meals and at bedtime. 12/16/15   Clayton Bibles, PA-C    Family History Family History  Problem Relation Age of Onset  . Colon cancer Neg Hx   . Esophageal cancer Neg Hx   . Stomach cancer Neg Hx   . Rectal cancer Neg Hx     Social History Social History  Substance Use Topics  . Smoking status: Former Smoker    Packs/day: 0.50    Years: 20.00    Types: Cigarettes    Quit date: 03/04/1978  . Smokeless tobacco: Never Used  . Alcohol use Yes     Comment: 10/30/2015 "might have 2-3 drinks/year"     Allergies   Review of patient's allergies indicates no known allergies.   Review of Systems Review of Systems  Musculoskeletal: Positive for back pain (occasional, with movement).  All other systems reviewed and are negative.    Physical Exam Updated  Vital Signs BP (!) 185/102   Pulse 72   Temp 97.8 F (36.6 C) (Oral)   Resp 18   Wt 68.9 kg   SpO2 100%   BMI 23.11 kg/m   Physical Exam  Constitutional: He appears well-developed and well-nourished. No distress.  HENT:  Head: Normocephalic and atraumatic.  Neck: Neck supple.  Cardiovascular: Normal rate and regular rhythm.   Pulmonary/Chest: Effort normal and breath sounds normal. No respiratory distress. He has no wheezes. He has no rales.  Abdominal: Soft. He exhibits no distension and no mass. There is tenderness (upper abdomen ). There is no rebound and no guarding.  Neurological: He is alert. He exhibits normal muscle tone.  Skin: He is not diaphoretic.  Nursing note and vitals reviewed.    ED Treatments / Results  Labs (all labs ordered are listed, but only abnormal results are displayed) Labs Reviewed  CBC WITH DIFFERENTIAL/PLATELET - Abnormal; Notable for the following:       Result Value   Hemoglobin 12.0 (*)    HCT 36.4 (*)    All other components within normal limits  COMPREHENSIVE METABOLIC PANEL - Abnormal; Notable for the following:    Glucose, Bld 127 (*)    Total Protein 6.4 (*)    All other components within normal limits  URINALYSIS, ROUTINE W REFLEX MICROSCOPIC (NOT AT [facility]) - Abnormal; Notable for the following:    Protein, ur >300 (*)    All other components within normal limits  URINE MICROSCOPIC-ADD ON - Abnormal; Notable for the following:    Squamous Epithelial / LPF 0-5 (*)    All other components within normal limits  LIPASE, BLOOD  BRAIN NATRIURETIC PEPTIDE  I-STAT TROPOININ, ED    EKG  EKG Interpretation None       Radiology Dg Abdomen Acute W/chest  Result Date: 12/16/2015 CLINICAL DATA:  Epigastric pain since colonoscopy 3 months ago. EXAM: DG ABDOMEN ACUTE W/ 1V CHEST COMPARISON:  12/08/2015.  Chest CT 11/17/2015. FINDINGS: The lungs are clear wiithout focal pneumonia, edema, pneumothorax or pleural effusion. The  cardiopericardial silhouette is within normal limits for size. Radiodense foreign body adjacent to the right hilum seen to be in the anterior chest wall on recent CT scan. Upright film shows no evidence for intraperitoneal free air. There is no evidence for gaseous bowel dilation to suggest obstruction. Lower lumbar fusion hardware noted. IMPRESSION: Negative abdominal radiographs.  No acute cardiopulmonary disease. Electronically Signed   By: Misty Stanley M.D.   On: 12/16/2015 09:54    Procedures Procedures (including  critical care time)  Medications Ordered in ED Medications  gi cocktail (Maalox,Lidocaine,Donnatal) (30 mLs Oral Given 12/16/15 0902)  morphine 4 MG/ML injection 4 mg (4 mg Intramuscular Given 12/16/15 0911)     Initial Impression / Assessment and Plan / ED Course  I have reviewed the triage vital signs and the nursing notes.  Pertinent labs & imaging results that were available during my care of the patient were reviewed by me and considered in my medical decision making (see chart for details).  Clinical Course    Afebrile nontoxic patient with months of chest and abdominal pain that is related to eating.  Has GI follow up scheduled for next week.  Has been seen in ED multiple times for same over the past few months.  Had negative CT abd/pelvis 09/2015 and CT angio chest 11/2015, negative abdominal US 10/22/15.  Had colonoscopy pt reports only demonstrated three polyps.  Workup reassuring today.  Suspect GERD/PUD. Given GI cocktail IM morphine with relief.  Pt on PPI at home.  Added carafate and GI cocktail.  D/C home with GI follow up.    Final Clinical Impressions(s) / ED Diagnoses   Final diagnoses:  Other chronic pain  Upper abdominal pain    New Prescriptions Discharge Medication List as of 12/16/2015 10:54 AM    START taking these medications   Details  Alum & Mag Hydroxide-Simeth (GI COCKTAIL) SUSP suspension Take 30 mLs by mouth 2 (two) times daily as needed  for indigestion (upper abdominal pain). Shake well., Starting Sat 12/16/2015, Print         [place], [place] 12/16/15 [place], MD 12/17/15 1030

## 2015-12-21 ENCOUNTER — Ambulatory Visit: Payer: Medicare Other | Admitting: Internal Medicine

## 2016-01-03 ENCOUNTER — Encounter: Payer: Self-pay | Admitting: Nurse Practitioner

## 2016-01-03 ENCOUNTER — Ambulatory Visit (INDEPENDENT_AMBULATORY_CARE_PROVIDER_SITE_OTHER): Payer: Medicare Other | Admitting: Nurse Practitioner

## 2016-01-03 VITALS — BP 146/70 | HR 88 | Ht 68.0 in | Wt 156.5 lb

## 2016-01-03 DIAGNOSIS — R109 Unspecified abdominal pain: Secondary | ICD-10-CM

## 2016-01-03 NOTE — Progress Notes (Signed)
     HPI:  Patient is a 65 year old male known to Dr. Henrene Pastor. He had a screening colonoscopy mid August with removal of 3 small adenomatous polyps. Patient is here for evaluation of abdominal pain which he states has been present for over a year but much worse since colonoscopy. He has been to the emergency department 4 times since colonoscopy for  evaluation of abdominal pain / chest pain.  Feels like a belt is wrapped around ED workups have been unremarkable including labs, chest CTA and ultrasound.  ECG without significant change. POC troponins all normal. GI cocktail worked in ED but not at home. The pain is both LUQ and RUQ and exacerbated by twisting, laying on sides. Hurts to even wear snug clothing. After meals his ribs "poke out" and cause worsening pain. No nausea. Reports excessive weight loss   Past Medical History:  Diagnosis Date  . Chest pain   . Colon polyps    adenomatous  . Hyperlipidemia   . Hypertension   . Refusal of blood transfusions as patient is Jehovah's Witness   . Type II diabetes mellitus ([facility])     Patient's surgical history, family medical history, social history, and allergies were all reviewed in Epic    Physical Exam: BP (!) 146/70   Pulse 88   Ht 5\' 8"  (1.727 m)   Wt 156 lb 8 oz (71 kg)   BMI 23.80 kg/m   GENERAL: Thin black male in NAD PSYCH: :cooperative, flat affect HEENT: Normocephalic, conjunctiva pink, mucous membranes moist, neck supple without masses CARDIAC:  RRR,  Soft murmur heard, no peripheral edema PULM: Normal respiratory effort, lungs CTA bilaterally, no wheezing ABDOMEN:  soft,  SKIN:  turgor, no lesions seen Musculoskeletal:  Normal muscle tone. Bilateral rectus abdominus muscle tenderness to even light palpation, + Cornett's sign.  NEURO: Alert and oriented x 3, no focal neurologic deficits  ASSESSMENT and PLAN:  65 year old male with abdominal wall pain. Evaluated in ED multiple times for abdominal pain / chest pain.  Symptoms chronic but states much worse since colonoscopy in August. ED evaluations have been unremarkable including chest CTA, ultrasound, labs, ECG. Weight up 4 pounds. History and exam today compatible with muscular pain. Advised patient to follow up with PCP. Dr. Henrene Pastor performed patient's colonoscopy in August. He was in the office today, examined patient as well and agrees pain not GI in nature.

## 2016-01-03 NOTE — Patient Instructions (Addendum)
Your have scheduled you an appointment with [facility], Mauricio Po, NP on 01/09/16 at 2:00pm.

## 2016-01-03 NOTE — Progress Notes (Signed)
I saw the patient with the GI nurse practitioner. He has abdominal wall/musculoskeletal tenderness. This is not a primary GI process. Not certain, but question drug seeking behavior. In any event, he should return to the care of his PCP

## 2016-01-05 ENCOUNTER — Encounter: Payer: Self-pay | Admitting: Family

## 2016-01-05 ENCOUNTER — Ambulatory Visit (INDEPENDENT_AMBULATORY_CARE_PROVIDER_SITE_OTHER): Payer: Medicare Other | Admitting: Family

## 2016-01-05 VITALS — BP 220/108 | HR 77 | Temp 97.7°F | Ht 68.0 in | Wt 162.5 lb

## 2016-01-05 DIAGNOSIS — R0789 Other chest pain: Secondary | ICD-10-CM | POA: Diagnosis not present

## 2016-01-05 DIAGNOSIS — R1084 Generalized abdominal pain: Secondary | ICD-10-CM

## 2016-01-05 DIAGNOSIS — R109 Unspecified abdominal pain: Secondary | ICD-10-CM | POA: Insufficient documentation

## 2016-01-05 MED ORDER — GABAPENTIN 300 MG PO CAPS: ORAL_CAPSULE | ORAL | 0 refills | Status: DC

## 2016-01-05 NOTE — Assessment & Plan Note (Signed)
Pain in the chest and abdomen of undetermined cause with multiple previous work ups being negative include chest x-ray and abdominal CT scan. Evaluated by GI with belief that this is not a GI related origin. There was also concern for possible drug seeking behavior. Based on physical exam musculoskeletal origin in unlikely with no trauma or increased tenderness with motions that would not explain the sensitivity. Symptoms appear with a possible neurological origin considering thoracic nerve root or possible anterior cutaneous nerve entrapment. Obtain MRI and start gabapentin. Follow up pending test results for possible referral or treatment.

## 2016-01-05 NOTE — Progress Notes (Signed)
Pre visit review using our clinic review tool, if applicable. No additional management support is needed unless otherwise documented below in the visit note. 

## 2016-01-05 NOTE — Patient Instructions (Addendum)
Thank you for choosing [facility].  SUMMARY AND INSTRUCTIONS:  They will call to schedule your MRI.   Please start the gabapentin to help with your pain.   Medication:  Your prescription(s) have been submitted to your pharmacy or been printed and provided for you. Please take as directed and contact our office if you believe you are having problem(s) with the medication(s) or have any questions.  Follow up:  If your symptoms worsen or fail to improve, please contact our office for further instruction, or in case of emergency go directly to the emergency room at the closest medical facility.
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
## 2016-01-09 ENCOUNTER — Ambulatory Visit: Payer: Medicare Other | Admitting: Family

## 2016-01-11 ENCOUNTER — Ambulatory Visit
Admission: RE | Admit: 2016-01-11 | Discharge: 2016-01-11 | Disposition: A | Discharge status: Home / Self Care | Payer: Medicare Other | Source: Ambulatory Visit | Attending: Family | Admitting: Family

## 2016-01-11 ENCOUNTER — Other Ambulatory Visit: Payer: Self-pay | Admitting: Family

## 2016-01-11 DIAGNOSIS — R0789 Other chest pain: Secondary | ICD-10-CM

## 2016-01-11 DIAGNOSIS — R1084 Generalized abdominal pain: Secondary | ICD-10-CM

## 2016-01-23 ENCOUNTER — Telehealth: Payer: Self-pay | Admitting: Family

## 2016-01-23 NOTE — Telephone Encounter (Signed)
[facility] wont schedule with pt because:  "WE do not see this diagnosis, thank you for the referral"  Please advise

## 2016-01-29 ENCOUNTER — Telehealth: Payer: Self-pay

## 2016-01-29 DIAGNOSIS — M792 Neuralgia and neuritis, unspecified: Secondary | ICD-10-CM

## 2016-01-29 NOTE — Telephone Encounter (Signed)
Pt called stating he has pain in abdomen and black pebbles in his stool. Please change dx code for neurology referral

## 2016-01-29 NOTE — Telephone Encounter (Signed)
Patient is also requesting a change in his pain meds because the current pain med is causing hallucinations.  Patient also states that Marya Amsler had spoke to him about pain management.  I think patient would be interested in this.

## 2016-01-31 MED ORDER — TRAMADOL HCL 50 MG PO TABS: 50.0000 mg | ORAL_TABLET | Freq: Three times a day (TID) | ORAL | 0 refills | Status: DC | PRN

## 2016-01-31 NOTE — Telephone Encounter (Signed)
Tramadol printed and to be faxed. New neurology referral will be placed.

## 2016-02-01 ENCOUNTER — Other Ambulatory Visit: Payer: Self-pay | Admitting: Family

## 2016-02-01 DIAGNOSIS — R0789 Other chest pain: Secondary | ICD-10-CM

## 2016-02-01 DIAGNOSIS — R1084 Generalized abdominal pain: Secondary | ICD-10-CM

## 2016-02-01 MED ORDER — GABAPENTIN 300 MG PO CAPS: 300.0000 mg | ORAL_CAPSULE | Freq: Three times a day (TID) | ORAL | 0 refills | Status: DC

## 2016-02-01 NOTE — Telephone Encounter (Signed)
Please advise, last fill 01/05/16

## 2016-02-01 NOTE — Telephone Encounter (Signed)
Pt aware.

## 2016-02-02 ENCOUNTER — Telehealth: Payer: Self-pay | Admitting: Family

## 2016-02-02 NOTE — Telephone Encounter (Signed)
Patient called in about his referral. He made the appointment. But while he was talking to them, they told him to call us because he is confused about why he is going there. The reason they have down is not why he thinks he is going there. Can you please call him and follow up to be sure he understands why he needs to go. Thank you.

## 2016-02-07 NOTE — Telephone Encounter (Signed)
He is being sent because I believe his abdominal pain is related to a neurological process.

## 2016-02-07 NOTE — Telephone Encounter (Signed)
LVM for patient to call back. ?

## 2016-02-12 ENCOUNTER — Ambulatory Visit: Payer: Medicare Other | Admitting: Internal Medicine

## 2016-03-16 ENCOUNTER — Emergency Department (HOSPITAL_COMMUNITY)
Admission: EM | Admit: 2016-03-16 | Discharge: 2016-03-17 | Disposition: A | Discharge status: Home / Self Care | Payer: Medicare Other | Attending: Emergency Medicine | Admitting: Emergency Medicine

## 2016-03-16 ENCOUNTER — Encounter (HOSPITAL_COMMUNITY): Payer: Self-pay

## 2016-03-16 ENCOUNTER — Emergency Department (HOSPITAL_COMMUNITY): Payer: Medicare Other

## 2016-03-16 DIAGNOSIS — Z79899 Other long term (current) drug therapy: Secondary | ICD-10-CM | POA: Insufficient documentation

## 2016-03-16 DIAGNOSIS — Z794 Long term (current) use of insulin: Secondary | ICD-10-CM | POA: Insufficient documentation

## 2016-03-16 DIAGNOSIS — Z87891 Personal history of nicotine dependence: Secondary | ICD-10-CM | POA: Insufficient documentation

## 2016-03-16 DIAGNOSIS — R1084 Generalized abdominal pain: Secondary | ICD-10-CM | POA: Insufficient documentation

## 2016-03-16 DIAGNOSIS — I10 Essential (primary) hypertension: Secondary | ICD-10-CM | POA: Diagnosis present

## 2016-03-16 DIAGNOSIS — E119 Type 2 diabetes mellitus without complications: Secondary | ICD-10-CM | POA: Insufficient documentation

## 2016-03-16 DIAGNOSIS — R55 Syncope and collapse: Secondary | ICD-10-CM | POA: Diagnosis not present

## 2016-03-16 LAB — URINALYSIS, ROUTINE W REFLEX MICROSCOPIC
BILIRUBIN URINE: NEGATIVE
Bacteria, UA: NONE SEEN
Glucose, UA: >=500 mg/dL — AB
Ketones, ur: NEGATIVE mg/dL
LEUKOCYTES UA: NEGATIVE
Nitrite: NEGATIVE
PH: 5 (ref 5.0–8.0)
Protein, ur: 100 mg/dL — AB
SPECIFIC GRAVITY, URINE: 1.017 (ref 1.005–1.030)
Squamous Epithelial / LPF: NONE SEEN

## 2016-03-16 LAB — BASIC METABOLIC PANEL
Anion gap: 11 (ref 5–15)
BUN: 36 mg/dL — ABNORMAL HIGH (ref 6–20)
CALCIUM: 9 mg/dL (ref 8.9–10.3)
CO2: 25 mmol/L (ref 22–32)
Chloride: 98 mmol/L — ABNORMAL LOW (ref 101–111)
Creatinine, Ser: 1.84 mg/dL — ABNORMAL HIGH (ref 0.61–1.24)
GFR, EST AFRICAN AMERICAN: 43 mL/min — AB (ref >60)
GFR, EST NON AFRICAN AMERICAN: 37 mL/min — AB (ref >60)
Glucose, Bld: 274 mg/dL — ABNORMAL HIGH (ref 65–99)
Potassium: 4.3 mmol/L (ref 3.5–5.1)
SODIUM: 134 mmol/L — AB (ref 135–145)

## 2016-03-16 LAB — CBC
HCT: 33.7 % — ABNORMAL LOW (ref 39.0–52.0)
Hemoglobin: 11.4 g/dL — ABNORMAL LOW (ref 13.0–17.0)
MCH: 26.1 pg (ref 26.0–34.0)
MCHC: 33.8 g/dL (ref 30.0–36.0)
MCV: 77.3 fL — ABNORMAL LOW (ref 78.0–100.0)
PLATELETS: 148 10*3/uL — AB (ref 150–400)
RBC: 4.36 MIL/uL (ref 4.22–5.81)
RDW: 12.1 % (ref 11.5–15.5)
WBC: 2.6 10*3/uL — AB (ref 4.0–10.5)

## 2016-03-16 LAB — I-STAT TROPONIN, ED: TROPONIN I, POC: 0.04 ng/mL (ref 0.00–0.08)

## 2016-03-16 LAB — CBG MONITORING, ED: Glucose-Capillary: 271 mg/dL — ABNORMAL HIGH (ref 65–99)

## 2016-03-16 MED ORDER — OXYCODONE-ACETAMINOPHEN 5-325 MG PO TABS
2.0000 | ORAL_TABLET | Freq: Once | ORAL | Status: AC
  Administered 2016-03-16: 2 via ORAL
  Filled 2016-03-16: qty 2

## 2016-03-16 MED ORDER — IOPAMIDOL (ISOVUE-300) INJECTION 61%
INTRAVENOUS | Status: AC
  Filled 2016-03-16: qty 100

## 2016-03-16 MED ORDER — AMLODIPINE BESYLATE 5 MG PO TABS
5.0000 mg | ORAL_TABLET | Freq: Once | ORAL | Status: AC
Start: 2016-03-16 — End: 2016-03-16
  Administered 2016-03-16: 5 mg via ORAL
  Filled 2016-03-16: qty 1

## 2016-03-16 MED ORDER — LABETALOL HCL 5 MG/ML IV SOLN
20.0000 mg | Freq: Once | INTRAVENOUS | Status: AC
  Administered 2016-03-16: 20 mg via INTRAVENOUS
  Filled 2016-03-16: qty 4

## 2016-03-16 MED ORDER — SODIUM CHLORIDE 0.9 % IV BOLUS (SEPSIS)
1000.0000 mL | Freq: Once | INTRAVENOUS | Status: AC
  Administered 2016-03-16: 1000 mL via INTRAVENOUS

## 2016-03-16 NOTE — ED Notes (Signed)
Patient transported to CT 

## 2016-03-16 NOTE — ED Notes (Signed)
Attempted PIV x 1;  

## 2016-03-16 NOTE — ED Provider Notes (Signed)
[facility] DEPT Provider Note   CSN: [id number] Arrival date & time: 03/16/16  1719     History   Chief Complaint Chief Complaint  Patient presents with  . hypertension/ syncope    HPI Richard Bennett is a 66 y.o. male.  HPI *Caution - External Email* HPI: 66 y/o AA male presents to ED c/o hypertension, dizziness, generalized weakness x 1 day. Pt states he woke up during the night, felt lightheaded while walking, and lost his balance causing him to fall towards the wall but was able to catch himself. Denies head trauma, syncope, AMS, focal weakness, altered sensation. Also c/o chronic, cramping, diffuse abdominal pain for several months and frequents ED often for similar complaint - had negative colonoscopy and abd [place] 10/2015 and negative CT abd/pelvis 09/2015, SOB, weight loss of 30 lbs in last several mths, cough x 1 wk, blurry vision. Pt's BP steadily increasing while in ED from 136/68 at arrival to 193/94 4 hrs later. Denies chest pain, decreased appetite, dysuria, constipation - baseline of 1 BM q 3 days, rectal bleeding. Pt saw PCP 6 days ago for HTN - meds were adjusted and he was given Tramadol and Methocarbamol for abdominal pain, also had negative heme occult test in office. Hx of DM II on Lantus x 5 yrs, HTN, HLD. Pt moved here from [place] 8 mths ago and is a retired Administrator - denies hx of DVT.   Past Medical History:  Diagnosis Date  . Chest pain   . Colon polyps    adenomatous  . Hyperlipidemia   . Hypertension   . Refusal of blood transfusions as patient is Jehovah's Witness   . Type II diabetes mellitus [facility])     Patient Active Problem List   Diagnosis Date Noted  . Abdominal pain 01/05/2016  . Chest pain 10/31/2015  . Hypertensive emergency 10/30/2015  . Hyponatremia 10/30/2015  . AKI (acute kidney injury) ([place]) 10/30/2015  . Pain in the chest   . Essential hypertension   . Hyperlipidemia   . Diabetes mellitus without complication [facility])     Past  Surgical History:  Procedure Laterality Date  . APPENDECTOMY    . BACK SURGERY    . LUMBAR DISC SURGERY     "herniated"       Home Medications    Prior to Admission medications   Medication Sig Start Date End Date Taking? Authorizing Provider  amLODipine (NORVASC) 5 MG tablet Take 1 tablet (5 mg total) by mouth daily. [id number]  Yes Delora Fuel, MD  LANTUS SOLOSTAR 100 UNIT/ML Solostar Pen Inject 15 Units into the skin every morning. [id number]  Yes Delora Fuel, MD  losartan (COZAAR) 100 MG tablet Take 100 mg by mouth daily.   Yes Historical Provider, MD  methocarbamol (ROBAXIN) 500 MG tablet Take 500 mg by mouth 4 (four) times daily. For muscle aches   Yes Historical Provider, MD  traMADol (ULTRAM) 50 MG tablet Take 1 tablet (50 mg total) by mouth every 8 (eight) hours as needed. 01/31/16  Yes Golden Circle, FNP  gabapentin (NEURONTIN) 300 MG capsule Take 1 capsule (300 mg total) by mouth 3 (three) times daily. Patient not taking: Reported on 03/16/2016 02/01/16   Golden Circle, FNP  losartan (COZAAR) 50 MG tablet Take 1 tablet (50 mg total) by mouth daily. Patient not taking: Reported on 03/16/2016 [id number]   Delora Fuel, MD  metoprolol tartrate (LOPRESSOR) 25 MG tablet Take 0.5 tablets (12.5 mg total) by mouth 2 (two)  times daily. Patient not taking: Reported on 03/16/2016 [id number]   Delora Fuel, MD    Family History Family History  Problem Relation Age of Onset  . Diabetes Mother   . Cancer Father   . Colon cancer Neg Hx   . Esophageal cancer Neg Hx   . Stomach cancer Neg Hx   . Rectal cancer Neg Hx     Social History Social History  Substance Use Topics  . Smoking status: Former Smoker    Packs/day: 0.50    Years: 20.00    Types: Cigarettes    Quit date: 03/04/1978  . Smokeless tobacco: Never Used  . Alcohol use Yes     Comment: 10/30/2015 "might have 2-3 drinks/year"     Allergies   Patient has no known allergies.   Review of Systems Review of Systems  All  other systems reviewed and are negative.    Physical Exam Updated Vital Signs BP (!) 197/109   Pulse 78   Temp 97.8 F (36.6 C) (Oral)   Resp 24   Ht 5\' 8"  (1.727 m)   Wt 72.6 kg   SpO2 100%   BMI 24.33 kg/m   Physical Exam  Constitutional: He is oriented to person, place, and time. He appears well-developed and well-nourished.  HENT:  Head: Normocephalic and atraumatic.  Right Ear: External ear normal.  Left Ear: External ear normal.  Nose: Nose normal.  Mouth/Throat: Oropharynx is clear and moist.  Eyes: Conjunctivae and EOM are normal. Pupils are equal, round, and reactive to light.  Neck: Normal range of motion. Neck supple.  Cardiovascular: Normal rate, regular rhythm, normal heart sounds and intact distal pulses.   Pulmonary/Chest: Effort normal and breath sounds normal.  Abdominal: Soft. Bowel sounds are normal.  Musculoskeletal: Normal range of motion.  Neurological: He is alert and oriented to person, place, and time. He displays normal reflexes. No cranial nerve deficit. He exhibits normal muscle tone. Coordination normal.  Skin: Skin is warm and dry. Capillary refill takes less than 2 seconds.  Psychiatric: He has a normal mood and affect. His behavior is normal.  Nursing note and vitals reviewed.    ED Treatments / Results  Labs (all labs ordered are listed, but only abnormal results are displayed) Labs Reviewed  BASIC METABOLIC PANEL - Abnormal; Notable for the following:       Result Value   Sodium 134 (*)    Chloride 98 (*)    Glucose, Bld 274 (*)    BUN 36 (*)    Creatinine, Ser 1.84 (*)    GFR calc non Af Amer 37 (*)    GFR calc Af Amer 43 (*)    All other components within normal limits  CBC - Abnormal; Notable for the following:    WBC 2.6 (*)    Hemoglobin 11.4 (*)    HCT 33.7 (*)    MCV 77.3 (*)    Platelets 148 (*)    All other components within normal limits  URINALYSIS, ROUTINE W REFLEX MICROSCOPIC - Abnormal; Notable for the  following:    Glucose, UA >=500 (*)    Hgb urine dipstick SMALL (*)    Protein, ur 100 (*)    All other components within normal limits  CBG MONITORING, ED - Abnormal; Notable for the following:    Glucose-Capillary 271 (*)    All other components within normal limits  I-STAT TROPOININ, ED    EKG  EKG Interpretation  Date/Time:  Saturday March 16 2016 18:08:46 EST Ventricular Rate:  88 PR Interval:  164 QRS Duration: 108 QT Interval:  422 QTC Calculation: 510 R Axis:   69 Text Interpretation:  Normal sinus rhythm st elevation v1 and v2 93mm Prolonged QT Abnormal ECG Reconfirmed by Christon Parada MD, Andee Poles [phone]) on 03/16/2016 9:04:36 PM       Radiology Dg Chest 2 View  Result Date: 03/16/2016 CLINICAL DATA:  Chest pain over the lower lungs and upper abdominal region for 6 months. Shortness of breath. Nausea. Cough and congestion. History of diabetes and hypertension. EXAM: CHEST  2 VIEW COMPARISON:  12/16/2015 FINDINGS: Normal heart size and pulmonary vascularity. Metallic foreign body in the soft tissues over the right anterior chest. No focal airspace disease or consolidation. No blunting of costophrenic angles. No pneumothorax. Mediastinal contours appear intact. IMPRESSION: No active cardiopulmonary disease. Electronically Signed   By: Lucienne Capers M.D.   On: 03/16/2016 23:49   Ct Head Wo Contrast  Result Date: 03/16/2016 CLINICAL DATA:  Acute onset of headache. Three episodes of syncope. Initial encounter. EXAM: CT HEAD WITHOUT CONTRAST TECHNIQUE: Contiguous axial images were obtained from the base of the skull through the vertex without intravenous contrast. COMPARISON:  CT of the head performed 09/11/2015 FINDINGS: Brain: No evidence of acute infarction, hemorrhage, hydrocephalus, extra-axial collection or mass lesion/mass effect. Prominence of the ventricles and sulci reflects mild cortical volume loss. Mild periventricular white matter change likely reflects small vessel  ischemic microangiopathy. Mild cerebellar atrophy is noted. The brainstem and fourth ventricle are within normal limits. The basal ganglia are unremarkable in appearance. The cerebral hemispheres demonstrate grossly normal gray-white differentiation. No mass effect or midline shift is seen. Vascular: No hyperdense vessel or unexpected calcification. Skull: There is no evidence of fracture; visualized osseous structures are unremarkable in appearance. Sinuses/Orbits: The visualized portions of the orbits are within normal limits. The paranasal sinuses and mastoid air cells are well-aerated. Other: No significant soft tissue abnormalities are seen. IMPRESSION: 1. No acute intracranial pathology seen on CT. 2. Mild cortical volume loss and scattered small vessel ischemic microangiopathy. Electronically Signed   By: Garald Balding M.D.   On: 03/16/2016 23:35   Ct Abdomen Pelvis W Contrast  Result Date: 03/16/2016 CLINICAL DATA:  Abdominal pain for 6 months or longer, weakness, type II diabetes mellitus, hypertension EXAM: CT ABDOMEN AND PELVIS WITH CONTRAST TECHNIQUE: Multidetector CT imaging of the abdomen and pelvis was performed using the standard protocol following bolus administration of intravenous contrast. Sagittal and coronal MPR images reconstructed from axial data set. CONTRAST:  No oral contrast.  100 cc Isovue-300 administered IV. COMPARISON:  09/11/2015 CT abdomen and pelvis, MR abdomen 01/11/2016 FINDINGS: Lower chest: Lung bases clear Hepatobiliary: Gallbladder unremarkable. Small nonspecific 7 x 8 mm low-attenuation focus anterior RIGHT lobe liver image 27 corresponding to a tiny cyst on prior MR. Pancreas: Normal appearance Spleen: Normal appearance Adrenals/Urinary Tract: Small LEFT renal cyst. Adrenal glands, kidneys, and ureters otherwise normal appearance. Diffuse bladder wall thickening though this may be an artifact from underdistention. Prostatic enlargement, gland measuring 5.7 x 3.8 x 4.3  cm. Stomach/Bowel: Prior appendectomy by history. Stomach and bowel loops unremarkable for exam lacking GI contrast. Vascular/Lymphatic: Atherosclerotic calcification aorta without aneurysm. No adenopathy. Reproductive: N/A Other: No free air or free fluid. Tiny LEFT inguinal hernia containing fat. Musculoskeletal: Prior lumbar fusion L4-L5. IMPRESSION: No acute intra-abdominal or intrapelvic abnormalities. Tiny LEFT inguinal hernia containing fat. Tiny hepatic and LEFT renal cysts. Prostatic enlargement. Prior L4-L5 fusion. Aortic atherosclerosis. Electronically  Signed   By: Lavonia Dana M.D.   On: 03/16/2016 23:46    Procedures Procedures (including critical care time)  Medications Ordered in ED Medications  iopamidol (ISOVUE-300) 61 % injection (not administered)  labetalol (NORMODYNE,TRANDATE) injection 20 mg (20 mg Intravenous Given 03/16/16 2219)  sodium chloride 0.9 % bolus 1,000 mL (1,000 mLs Intravenous New Bag/Given 03/16/16 2219)  amLODipine (NORVASC) tablet 5 mg (5 mg Oral Given 03/16/16 2159)  oxyCODONE-acetaminophen (PERCOCET/ROXICET) 5-325 MG per tablet 2 tablet (2 tablets Oral Given 03/16/16 2159)     Initial Impression / Assessment and Plan / ED Course  I have reviewed the triage vital signs and the nursing notes.  Pertinent labs & imaging results that were available during my care of the patient were reviewed by me and considered in my medical decision making (see chart for details).  Clinical Course    1- falls-patient ambulatory with normal gait here 2- hypertension- bp down here with amlodipine, patient advised to contiinue home meds and follow up with pmd Monday. 3- hyperglycemia 4-abdominal pain- no definite etiology found- patient advisd regarding follow up.     Final Clinical Impressions(s) / ED Diagnoses   Final diagnoses:  None    New Prescriptions New Prescriptions   No medications on file     Pattricia Boss, MD 03/17/16 0001

## 2016-03-16 NOTE — ED Notes (Signed)
CBG 272. 

## 2016-03-16 NOTE — ED Notes (Signed)
RN Angela Nevin informed of pt BP

## 2016-03-16 NOTE — ED Notes (Signed)
ED Provider at bedside. 

## 2016-03-16 NOTE — ED Triage Notes (Signed)
Patient here with complaining of BP running high the past week. States that he had 3 syncopal events today unwitnessed, denies injury. On arrival alert to person and birth date only. States that he is taking meds as directed and Blood sugar running high as well. No signs of trauma. No pain

## 2016-03-17 NOTE — Discharge Instructions (Signed)
Please take all bp medicine as prescribed. Recheck with your doctor for follow up of abdominal pain, high blood sugar, and hypertension.

## 2016-03-24 ENCOUNTER — Emergency Department (HOSPITAL_COMMUNITY)
Admission: EM | Admit: 2016-03-24 | Discharge: 2016-03-24 | Disposition: A | Discharge status: Home / Self Care | Payer: Medicare Other | Attending: Emergency Medicine | Admitting: Emergency Medicine

## 2016-03-24 ENCOUNTER — Encounter (HOSPITAL_COMMUNITY): Payer: Self-pay | Admitting: Family Medicine

## 2016-03-24 DIAGNOSIS — I10 Essential (primary) hypertension: Secondary | ICD-10-CM | POA: Insufficient documentation

## 2016-03-24 DIAGNOSIS — R55 Syncope and collapse: Secondary | ICD-10-CM | POA: Diagnosis present

## 2016-03-24 DIAGNOSIS — Z79899 Other long term (current) drug therapy: Secondary | ICD-10-CM | POA: Insufficient documentation

## 2016-03-24 DIAGNOSIS — Z87891 Personal history of nicotine dependence: Secondary | ICD-10-CM | POA: Insufficient documentation

## 2016-03-24 DIAGNOSIS — E119 Type 2 diabetes mellitus without complications: Secondary | ICD-10-CM | POA: Insufficient documentation

## 2016-03-24 LAB — URINALYSIS, ROUTINE W REFLEX MICROSCOPIC
BACTERIA UA: NONE SEEN
Bilirubin Urine: NEGATIVE
KETONES UR: 5 mg/dL — AB
Leukocytes, UA: NEGATIVE
Nitrite: NEGATIVE
Protein, ur: 100 mg/dL — AB
Specific Gravity, Urine: 1.022 (ref 1.005–1.030)
pH: 5 (ref 5.0–8.0)

## 2016-03-24 LAB — CBC
HCT: 32.4 % — ABNORMAL LOW (ref 39.0–52.0)
HEMOGLOBIN: 10.8 g/dL — AB (ref 13.0–17.0)
MCH: 25.8 pg — AB (ref 26.0–34.0)
MCHC: 33.3 g/dL (ref 30.0–36.0)
MCV: 77.5 fL — AB (ref 78.0–100.0)
Platelets: 238 10*3/uL (ref 150–400)
RBC: 4.18 MIL/uL — ABNORMAL LOW (ref 4.22–5.81)
RDW: 12.5 % (ref 11.5–15.5)
WBC: 13.3 10*3/uL — ABNORMAL HIGH (ref 4.0–10.5)

## 2016-03-24 LAB — BASIC METABOLIC PANEL
ANION GAP: 11 (ref 5–15)
BUN: 32 mg/dL — ABNORMAL HIGH (ref 6–20)
CALCIUM: 8.8 mg/dL — AB (ref 8.9–10.3)
CHLORIDE: 94 mmol/L — AB (ref 101–111)
CO2: 25 mmol/L (ref 22–32)
Creatinine, Ser: 1.54 mg/dL — ABNORMAL HIGH (ref 0.61–1.24)
GFR calc Af Amer: 53 mL/min — ABNORMAL LOW (ref >60)
GFR calc non Af Amer: 46 mL/min — ABNORMAL LOW (ref >60)
GLUCOSE: 331 mg/dL — AB (ref 65–99)
Potassium: 3.7 mmol/L (ref 3.5–5.1)
Sodium: 130 mmol/L — ABNORMAL LOW (ref 135–145)

## 2016-03-24 LAB — RAPID URINE DRUG SCREEN, HOSP PERFORMED
AMPHETAMINES: NOT DETECTED
BENZODIAZEPINES: NOT DETECTED
Barbiturates: NOT DETECTED
COCAINE: NOT DETECTED
OPIATES: POSITIVE — AB
Tetrahydrocannabinol: NOT DETECTED

## 2016-03-24 LAB — CBG MONITORING, ED: Glucose-Capillary: 284 mg/dL — ABNORMAL HIGH (ref 65–99)

## 2016-03-24 MED ORDER — HYDRALAZINE HCL 10 MG PO TABS
10.0000 mg | ORAL_TABLET | Freq: Once | ORAL | Status: AC
  Administered 2016-03-24: 10 mg via ORAL
  Filled 2016-03-24: qty 1

## 2016-03-24 MED ORDER — HYDRALAZINE HCL 20 MG/ML IJ SOLN: 10.0000 mg | Freq: Once | INTRAMUSCULAR | Status: DC

## 2016-03-24 NOTE — ED Provider Notes (Signed)
[facility] DEPT Provider Note   CSN: [id number] Arrival date & time: 03/24/16  1328    History   Chief Complaint Chief Complaint  Patient presents with  . Near Syncope    HPI Richard Bennett is a 66 y.o. male.  HPI   Patient with h/o T2DM, HTN, HLD, chronic abd pain with multiple ED visits in last year presents via EMS for reportedly near syncope in supermarket without LOC.    Patient reports that he does not know why he is here.  He thinks they were concerned about his BP at the supermarket and called EMS.  He does not want to be evaluated and wants to go home.  He says that he does not feel well but will not specify how so.  He reports he does not know what is wrong with him and no one else does either.  Later states to attending provider that he was grocery shopping and felt as though he lost control of his body.  No LOC, no head injury, remembers events.  Felt it coming on.  Denied other complaints.  No longer mentioning leaving.   Past Medical History:  Diagnosis Date  . Chest pain   . Colon polyps    adenomatous  . Hyperlipidemia   . Hypertension   . Refusal of blood transfusions as patient is Jehovah's Witness   . Type II diabetes mellitus [facility])     Patient Active Problem List   Diagnosis Date Noted  . Abdominal pain 01/05/2016  . Chest pain 10/31/2015  . Hypertensive emergency 10/30/2015  . Hyponatremia 10/30/2015  . AKI (acute kidney injury) ([place]) 10/30/2015  . Pain in the chest   . Essential hypertension   . Hyperlipidemia   . Diabetes mellitus without complication [facility])     Past Surgical History:  Procedure Laterality Date  . APPENDECTOMY    . BACK SURGERY    . LUMBAR DISC SURGERY     "herniated"      Home Medications    Prior to Admission medications   Medication Sig Start Date End Date Taking? Authorizing Provider  amLODipine (NORVASC) 5 MG tablet Take 1 tablet (5 mg total) by mouth daily. [id number]   Delora Fuel, MD  gabapentin  (NEURONTIN) 300 MG capsule Take 1 capsule (300 mg total) by mouth 3 (three) times daily. Patient not taking: Reported on 03/16/2016 02/01/16   Golden Circle, FNP  LANTUS SOLOSTAR 100 UNIT/ML Solostar Pen Inject 15 Units into the skin every morning. [id number]   Delora Fuel, MD  losartan (COZAAR) 100 MG tablet Take 100 mg by mouth daily.    Historical Provider, MD  losartan (COZAAR) 50 MG tablet Take 1 tablet (50 mg total) by mouth daily. Patient not taking: Reported on 03/16/2016 [id number]   Delora Fuel, MD  methocarbamol (ROBAXIN) 500 MG tablet Take 500 mg by mouth 4 (four) times daily. For muscle aches    Historical Provider, MD  metoprolol tartrate (LOPRESSOR) 25 MG tablet Take 0.5 tablets (12.5 mg total) by mouth 2 (two) times daily. Patient not taking: Reported on 03/16/2016 [id number]   Delora Fuel, MD  traMADol (ULTRAM) 50 MG tablet Take 1 tablet (50 mg total) by mouth every 8 (eight) hours as needed. 01/31/16   Golden Circle, FNP    Family History Family History  Problem Relation Age of Onset  . Diabetes Mother   . Cancer Father   . Colon cancer Neg Hx   . Esophageal cancer Neg Hx   .  Stomach cancer Neg Hx   . Rectal cancer Neg Hx     Social History Social History  Substance Use Topics  . Smoking status: Former Smoker    Packs/day: 0.50    Years: 20.00    Types: Cigarettes    Quit date: 03/04/1978  . Smokeless tobacco: Never Used  . Alcohol use Yes     Comment: 10/30/2015 "might have 2-3 drinks/year"     Allergies   Patient has no known allergies.   Review of Systems Review of Systems  Constitutional: Negative.   HENT: Negative.   Respiratory: Negative.   Cardiovascular: Negative.   Gastrointestinal: Positive for abdominal pain (soreness). Negative for diarrhea, nausea and vomiting.  Genitourinary: Negative.   Skin: Negative.   Neurological: Negative for seizures, syncope, speech difficulty, weakness, light-headedness and headaches.  Psychiatric/Behavioral:  Negative.    Physical Exam Updated Vital Signs BP 181/83 (BP Location: Right Arm) Comment: rechecked BP  Pulse 84   Temp 98.5 F (36.9 C) (Oral)   Resp 12   SpO2 99%   Physical Exam  Constitutional: He is oriented to person, place, and time. He appears well-developed and well-nourished. No distress.  HENT:  Head: Normocephalic and atraumatic.  Right Ear: External ear normal.  Left Ear: External ear normal.  Mouth/Throat: Oropharynx is clear and moist.  Eyes: Conjunctivae and EOM are normal. Pupils are equal, round, and reactive to light.  Horizontal nystagmus  Neck: Neck supple.  Cardiovascular: Normal rate and regular rhythm.   Murmur (4/6 mid-systolic click) heard. Pulmonary/Chest: Effort normal and breath sounds normal. No respiratory distress. He has no wheezes.  Abdominal: Soft. Bowel sounds are normal. He exhibits no distension and no mass. There is no tenderness. There is no rebound and no guarding.  Musculoskeletal: He exhibits no edema, tenderness or deformity.  Lymphadenopathy:    He has no cervical adenopathy.  Neurological: He is alert and oriented to person, place, and time. No cranial nerve deficit. He exhibits normal muscle tone.  Strength and sensation intact  Skin: Skin is warm and dry. Capillary refill takes less than 2 seconds. No rash noted.  Psychiatric:  Flat affect Hesitates before answering questions Answers questions appropriately  Nursing note and vitals reviewed.    ED Treatments / Results  Labs (all labs ordered are listed, but only abnormal results are displayed) Labs Reviewed  BASIC METABOLIC PANEL - Abnormal; Notable for the following:       Result Value   Sodium 130 (*)    Chloride 94 (*)    Glucose, Bld 331 (*)    BUN 32 (*)    Creatinine, Ser 1.54 (*)    Calcium 8.8 (*)    GFR calc non Af Amer 46 (*)    GFR calc Af Amer 53 (*)    All other components within normal limits  CBC - Abnormal; Notable for the following:    WBC 13.3  (*)    RBC 4.18 (*)    Hemoglobin 10.8 (*)    HCT 32.4 (*)    MCV 77.5 (*)    MCH 25.8 (*)    All other components within normal limits  CBG MONITORING, ED - Abnormal; Notable for the following:    Glucose-Capillary 284 (*)    All other components within normal limits  URINALYSIS, ROUTINE W REFLEX MICROSCOPIC  RAPID URINE DRUG SCREEN, HOSP PERFORMED  CBG MONITORING, ED    EKG  EKG Interpretation  Date/Time:  Sunday March 24 2016 13:36:55 EST Ventricular  Rate:  94 PR Interval:    QRS Duration: 114 QT Interval:  391 QTC Calculation: 489 R Axis:   62 Text Interpretation:  Sinus rhythm ST elevation in V2 and V3 69mm No significant change since last tracing Confirmed by Maryan Rued  MD, Loree Fee ([id number]) on 03/24/2016 1:41:07 PM Also confirmed by Maryan Rued  MD, Loree Fee ([id number]), editor [place], [place], [place] [id number])  on 03/24/2016 2:47:33 PM       Radiology No results found.  Procedures Procedures (including critical care time)  Medications Ordered in ED Medications  hydrALAZINE (APRESOLINE) tablet 10 mg (10 mg Oral Given 03/24/16 1718)     Initial Impression / Assessment and Plan / ED Course  I have reviewed the triage vital signs and the nursing notes.  Pertinent labs & imaging results that were available during my care of the patient were reviewed by me and considered in my medical decision making (see chart for details).     Patient here for reported near syncopal event.  EKG is unchanged from previous tracings.  Labs reveal stable microcytic anemia (Hgb 10.8), slight leukocytosis (13.3).  Recent head CT without acute abnormality.    BP initially well controlled, but increasing throughout stay so 1 dose of IV Hydralazine 10mg  given.  Concern for possible atypical seizure activity - patient has follow-up already scheduled with neurology.  No stroke symptoms and normal neurologic exam except for lag in response to questions (though uncertain of patient's baseline).  Urine  studies pending at time of sign out.  Oncoming team to follow-up and treat appropriately.  Final Clinical Impressions(s) / ED Diagnoses   Final diagnoses:  None    New Prescriptions New Prescriptions   No medications on file    Virginia Crews, MD, MPH PGY-3,  [facility] 03/24/2016 5:19 PM    Virginia Crews, MD 03/24/16 [place], MD 03/24/16 2130

## 2016-03-24 NOTE — ED Notes (Signed)
Attempted IV start x2. Phlebotomy aware of need for labs

## 2016-03-24 NOTE — ED Notes (Signed)
Phlebotomy contacted regarding delay in lab draw.

## 2016-03-24 NOTE — ED Triage Notes (Signed)
Pt presents via [facility] with c/o near syncopal episode while grocery shopping. Pt felt like he was going to faint and was able to lower himself to the ground; denies LOC and states he remembers everything. However, pt is oriented to self and place only and is mildly lethargic. Pt notes he is a T2DM.

## 2016-03-24 NOTE — Discharge Instructions (Signed)
Please read attached information. If you experience any new or worsening signs or symptoms please return to the emergency room for evaluation. Please follow-up with your primary care provider or specialist as discussed.  °

## 2016-03-24 NOTE — ED Provider Notes (Signed)
Patient care is signed out to me at the time of shift change pending urinalysis. Please see previous provider's note for full H&P. Patient is well-appearing and resting in exam bed at the time of my evaluation. He denies any complaints. He is able to ambulate with steady gait. He has no delay in response time to questioning and is alert and oriented. Patient was ambulated with no unsteady gait or difficulties. No complaints of dizziness. Patient's urine returned showing opioids, no other drugs, no signs of urinary tract infection. He will be discharged home with follow-up with neurology as previously scheduled, primary care for reevaluation. He is given strict return precautions, he verbalizes understanding and agreement to today's plan.  Vitals:   03/24/16 1800 03/24/16 1856  BP: 185/91 184/90  Pulse: 82 88  Resp: 11 16  Temp:  98.2 F (36.8 C)      Okey Regal, PA-C 03/24/16 [place], MD 03/24/16 2130

## 2016-04-10 ENCOUNTER — Ambulatory Visit: Payer: Medicare Other | Admitting: Neurology

## 2016-04-12 ENCOUNTER — Encounter: Payer: Self-pay | Admitting: Neurology

## 2016-07-21 ENCOUNTER — Emergency Department (HOSPITAL_COMMUNITY): Payer: Medicare HMO

## 2016-07-21 ENCOUNTER — Encounter (HOSPITAL_COMMUNITY): Payer: Self-pay | Admitting: *Deleted

## 2016-07-21 ENCOUNTER — Inpatient Hospital Stay (HOSPITAL_COMMUNITY)
Admission: EM | Admit: 2016-07-21 | Discharge: 2016-07-27 | DRG: 100 | Disposition: A | Discharge status: Skilled Nursing Facility | Payer: Medicare HMO | Attending: Family Medicine | Admitting: Family Medicine

## 2016-07-21 DIAGNOSIS — R569 Unspecified convulsions: Secondary | ICD-10-CM

## 2016-07-21 DIAGNOSIS — N183 Chronic kidney disease, stage 3 (moderate): Secondary | ICD-10-CM | POA: Diagnosis present

## 2016-07-21 DIAGNOSIS — E785 Hyperlipidemia, unspecified: Secondary | ICD-10-CM | POA: Diagnosis present

## 2016-07-21 DIAGNOSIS — I639 Cerebral infarction, unspecified: Secondary | ICD-10-CM | POA: Diagnosis not present

## 2016-07-21 DIAGNOSIS — E1151 Type 2 diabetes mellitus with diabetic peripheral angiopathy without gangrene: Secondary | ICD-10-CM | POA: Diagnosis present

## 2016-07-21 DIAGNOSIS — G40901 Epilepsy, unspecified, not intractable, with status epilepticus: Principal | ICD-10-CM | POA: Diagnosis present

## 2016-07-21 DIAGNOSIS — E1122 Type 2 diabetes mellitus with diabetic chronic kidney disease: Secondary | ICD-10-CM | POA: Diagnosis present

## 2016-07-21 DIAGNOSIS — E1011 Type 1 diabetes mellitus with ketoacidosis with coma: Secondary | ICD-10-CM

## 2016-07-21 DIAGNOSIS — I6522 Occlusion and stenosis of left carotid artery: Secondary | ICD-10-CM | POA: Diagnosis present

## 2016-07-21 DIAGNOSIS — I161 Hypertensive emergency: Secondary | ICD-10-CM | POA: Diagnosis present

## 2016-07-21 DIAGNOSIS — R269 Unspecified abnormalities of gait and mobility: Secondary | ICD-10-CM | POA: Diagnosis not present

## 2016-07-21 DIAGNOSIS — R4182 Altered mental status, unspecified: Secondary | ICD-10-CM

## 2016-07-21 DIAGNOSIS — G9340 Encephalopathy, unspecified: Secondary | ICD-10-CM | POA: Diagnosis present

## 2016-07-21 DIAGNOSIS — Z9289 Personal history of other medical treatment: Secondary | ICD-10-CM

## 2016-07-21 DIAGNOSIS — IMO0002 Reserved for concepts with insufficient information to code with codable children: Secondary | ICD-10-CM | POA: Diagnosis present

## 2016-07-21 DIAGNOSIS — I69398 Other sequelae of cerebral infarction: Secondary | ICD-10-CM

## 2016-07-21 DIAGNOSIS — R32 Unspecified urinary incontinence: Secondary | ICD-10-CM | POA: Diagnosis not present

## 2016-07-21 DIAGNOSIS — R339 Retention of urine, unspecified: Secondary | ICD-10-CM | POA: Diagnosis not present

## 2016-07-21 DIAGNOSIS — Z79899 Other long term (current) drug therapy: Secondary | ICD-10-CM

## 2016-07-21 DIAGNOSIS — E1165 Type 2 diabetes mellitus with hyperglycemia: Secondary | ICD-10-CM | POA: Diagnosis present

## 2016-07-21 DIAGNOSIS — Z87891 Personal history of nicotine dependence: Secondary | ICD-10-CM

## 2016-07-21 DIAGNOSIS — D509 Iron deficiency anemia, unspecified: Secondary | ICD-10-CM | POA: Diagnosis present

## 2016-07-21 DIAGNOSIS — J9600 Acute respiratory failure, unspecified whether with hypoxia or hypercapnia: Secondary | ICD-10-CM | POA: Diagnosis present

## 2016-07-21 DIAGNOSIS — Z86010 Personal history of colonic polyps: Secondary | ICD-10-CM

## 2016-07-21 DIAGNOSIS — Z794 Long term (current) use of insulin: Secondary | ICD-10-CM

## 2016-07-21 DIAGNOSIS — R451 Restlessness and agitation: Secondary | ICD-10-CM | POA: Diagnosis not present

## 2016-07-21 DIAGNOSIS — G9341 Metabolic encephalopathy: Secondary | ICD-10-CM | POA: Diagnosis present

## 2016-07-21 DIAGNOSIS — R092 Respiratory arrest: Secondary | ICD-10-CM

## 2016-07-21 DIAGNOSIS — K580 Irritable bowel syndrome with diarrhea: Secondary | ICD-10-CM | POA: Diagnosis present

## 2016-07-21 LAB — I-STAT CG4 LACTIC ACID, ED: LACTIC ACID, VENOUS: 5.57 mmol/L — AB (ref 0.5–1.9)

## 2016-07-21 LAB — I-STAT CHEM 8, ED
BUN: 23 mg/dL — AB (ref 6–20)
Calcium, Ion: 1.12 mmol/L — ABNORMAL LOW (ref 1.15–1.40)
Chloride: 95 mmol/L — ABNORMAL LOW (ref 101–111)
Creatinine, Ser: 1.9 mg/dL — ABNORMAL HIGH (ref 0.61–1.24)
Glucose, Bld: 619 mg/dL (ref 65–99)
HEMATOCRIT: 37 % — AB (ref 39.0–52.0)
HEMOGLOBIN: 12.6 g/dL — AB (ref 13.0–17.0)
Potassium: 4.1 mmol/L (ref 3.5–5.1)
Sodium: 131 mmol/L — ABNORMAL LOW (ref 135–145)
TCO2: 22 mmol/L (ref 0–100)

## 2016-07-21 LAB — CBC WITH DIFFERENTIAL/PLATELET
BASOS ABS: 0 10*3/uL (ref 0.0–0.1)
BASOS PCT: 0 %
Eosinophils Absolute: 0.1 10*3/uL (ref 0.0–0.7)
Eosinophils Relative: 2 %
HCT: 35.3 % — ABNORMAL LOW (ref 39.0–52.0)
HEMOGLOBIN: 11.8 g/dL — AB (ref 13.0–17.0)
Lymphocytes Relative: 37 %
Lymphs Abs: 2 10*3/uL (ref 0.7–4.0)
MCH: 26.6 pg (ref 26.0–34.0)
MCHC: 33.4 g/dL (ref 30.0–36.0)
MCV: 79.7 fL (ref 78.0–100.0)
Monocytes Absolute: 0.2 10*3/uL (ref 0.1–1.0)
Monocytes Relative: 4 %
NEUTROS ABS: 3.2 10*3/uL (ref 1.7–7.7)
NEUTROS PCT: 57 %
PLATELETS: 227 10*3/uL (ref 150–400)
RBC: 4.43 MIL/uL (ref 4.22–5.81)
RDW: 12.4 % (ref 11.5–15.5)
WBC: 5.5 10*3/uL (ref 4.0–10.5)

## 2016-07-21 LAB — I-STAT ARTERIAL BLOOD GAS, ED
ACID-BASE DEFICIT: 1 mmol/L (ref 0.0–2.0)
Bicarbonate: 25.9 mmol/L (ref 20.0–28.0)
O2 SAT: 100 %
TCO2: 27 mmol/L (ref 0–100)
pCO2 arterial: 50.2 mmHg — ABNORMAL HIGH (ref 32.0–48.0)
pH, Arterial: 7.32 — ABNORMAL LOW (ref 7.350–7.450)
pO2, Arterial: 215 mmHg — ABNORMAL HIGH (ref 83.0–108.0)

## 2016-07-21 LAB — COMPREHENSIVE METABOLIC PANEL
ALT: 25 U/L (ref 17–63)
AST: 33 U/L (ref 15–41)
Albumin: 3.3 g/dL — ABNORMAL LOW (ref 3.5–5.0)
Alkaline Phosphatase: 80 U/L (ref 38–126)
Anion gap: 14 (ref 5–15)
BUN: 22 mg/dL — AB (ref 6–20)
CHLORIDE: 95 mmol/L — AB (ref 101–111)
CO2: 21 mmol/L — AB (ref 22–32)
CREATININE: 1.97 mg/dL — AB (ref 0.61–1.24)
Calcium: 9 mg/dL (ref 8.9–10.3)
GFR calc non Af Amer: 34 mL/min — ABNORMAL LOW (ref >60)
GFR, EST AFRICAN AMERICAN: 39 mL/min — AB (ref >60)
Glucose, Bld: 643 mg/dL (ref 65–99)
Potassium: 4.1 mmol/L (ref 3.5–5.1)
SODIUM: 130 mmol/L — AB (ref 135–145)
Total Bilirubin: 0.6 mg/dL (ref 0.3–1.2)
Total Protein: 6.8 g/dL (ref 6.5–8.1)

## 2016-07-21 LAB — AMMONIA: Ammonia: 40 umol/L — ABNORMAL HIGH (ref 9–35)

## 2016-07-21 LAB — ACETAMINOPHEN LEVEL

## 2016-07-21 LAB — SALICYLATE LEVEL

## 2016-07-21 LAB — CBG MONITORING, ED: Glucose-Capillary: >600 mg/dL (ref 65–99)

## 2016-07-21 MED ORDER — SODIUM CHLORIDE 0.9 % IV BOLUS (SEPSIS)
1000.0000 mL | Freq: Once | INTRAVENOUS | Status: AC
  Administered 2016-07-22: 1000 mL via INTRAVENOUS

## 2016-07-21 MED ORDER — SODIUM CHLORIDE 0.9 % IV BOLUS (SEPSIS)
1000.0000 mL | Freq: Once | INTRAVENOUS | Status: AC
  Administered 2016-07-21: 1000 mL via INTRAVENOUS

## 2016-07-21 MED ORDER — NALOXONE HCL 0.4 MG/ML IJ SOLN
0.4000 mg | Freq: Once | INTRAMUSCULAR | Status: AC
  Administered 2016-07-21: 0.4 mg via INTRAVENOUS
  Filled 2016-07-21: qty 1

## 2016-07-21 MED ORDER — SODIUM CHLORIDE 0.9 % IV SOLN
INTRAVENOUS | Status: DC
  Administered 2016-07-22: 125 mL/h via INTRAVENOUS

## 2016-07-21 MED ORDER — DEXTROSE-NACL 5-0.45 % IV SOLN: INTRAVENOUS | Status: DC

## 2016-07-21 MED ORDER — SODIUM CHLORIDE 0.9 % IV SOLN
INTRAVENOUS | Status: DC
  Administered 2016-07-22: 5.4 [IU]/h via INTRAVENOUS
  Filled 2016-07-21: qty 1

## 2016-07-21 MED ORDER — SODIUM CHLORIDE 0.9 % IV SOLN
1000.0000 mg | Freq: Once | INTRAVENOUS | Status: AC
  Administered 2016-07-21: 1000 mg via INTRAVENOUS
  Filled 2016-07-21: qty 10

## 2016-07-21 MED ORDER — SODIUM CHLORIDE 0.9 % IV SOLN
INTRAVENOUS | Status: DC
  Administered 2016-07-22: 03:00:00 via INTRAVENOUS

## 2016-07-21 NOTE — ED Triage Notes (Signed)
Pt has no iv

## 2016-07-21 NOTE — ED Notes (Signed)
Pharmacy called for keppra

## 2016-07-21 NOTE — ED Notes (Signed)
No previous history of seizures accoding to his old chart.    [facility] gave  5 mg s of versed im on the way here

## 2016-07-21 NOTE — ED Triage Notes (Signed)
cbg high 

## 2016-07-21 NOTE — ED Notes (Signed)
Date and time results received: 07/21/16 2350   Test: GLUCOSE Critical Value: 643  Name of Provider Notified: DR. PALUMBO  NO ADDITIONAL ORDERS RECEIVED

## 2016-07-21 NOTE — ED Triage Notes (Signed)
Found outside having a focal seizure in the parking lot at [address]  [place] in by [facility]. Nasal airways in both nostriles   No seizure activity at present

## 2016-07-21 NOTE — ED Provider Notes (Signed)
[facility] DEPT Provider Note   CSN: [id number] Arrival date & time: 07/21/16  2247  By signing my name below, I, Hansel Feinstein, attest that this documentation has been prepared under the direction and in the presence of [place], Laveyah Oriol, MD. Electronically Signed: Hansel Feinstein, ED Scribe. 07/21/16. 11:10 PM.     History   Chief Complaint Chief Complaint  Patient presents with  . Seizures   LEVEL 5 CAVEAT: HPI and ROS limited due to pt conditions    HPI Richard Bennett is a 66 y.o. male brought in by ambulance who presents to the Emergency Department for evaluation of seizures that began PTA. Per EMS, pt was found outside having a focal seizure in a parking lot. Upon EMS arrival, pt seized for about a minute and seized again en route to the ED. EMS administered 5 mg Versed en route. Pt was incontinent of bladder and bowel. CBG 619 on arrival. Pt has no prior h/o seizure.    The history is provided by the EMS personnel. The history is limited by the condition of the patient. No language interpreter was used.  Seizures   This is a new problem. The current episode started less than 1 hour ago. The problem has been resolved. There was 1 seizure. Associated symptoms include confusion. Characteristics include bladder incontinence and rhythmic jerking. The episode was witnessed. The seizures did not continue in the ED. There has been no fever. Meds prior to arrival: versed.    Past Medical History:  Diagnosis Date  . Chest pain   . Colon polyps    adenomatous  . Hyperlipidemia   . Hypertension   . Refusal of blood transfusions as patient is Jehovah's Witness   . Type II diabetes mellitus [facility])     Patient Active Problem List   Diagnosis Date Noted  . Abdominal pain 01/05/2016  . Chest pain 10/31/2015  . Hypertensive emergency 10/30/2015  . Hyponatremia 10/30/2015  . AKI (acute kidney injury) ([place]) 10/30/2015  . Pain in the chest   . Essential hypertension   . Hyperlipidemia   .  Diabetes mellitus without complication [facility])     Past Surgical History:  Procedure Laterality Date  . APPENDECTOMY    . BACK SURGERY    . LUMBAR DISC SURGERY     "herniated"       Home Medications    Prior to Admission medications   Medication Sig Start Date End Date Taking? Authorizing Provider  amLODipine (NORVASC) 5 MG tablet Take 1 tablet (5 mg total) by mouth daily. 1/85/63   Delora Fuel, MD  gabapentin (NEURONTIN) 300 MG capsule Take 1 capsule (300 mg total) by mouth 3 (three) times daily. Patient not taking: Reported on 03/16/2016 02/01/16   Golden Circle, FNP  LANTUS SOLOSTAR 100 UNIT/ML Solostar Pen Inject 15 Units into the skin every morning. 1/49/70   Delora Fuel, MD  losartan (COZAAR) 100 MG tablet Take 100 mg by mouth daily.    [provider]  losartan (COZAAR) 50 MG tablet Take 1 tablet (50 mg total) by mouth daily. Patient not taking: Reported on 2/63/7858 8/50/27   Delora Fuel, MD  methocarbamol (ROBAXIN) 500 MG tablet Take 500 mg by mouth 4 (four) times daily. For muscle aches    [provider]  metoprolol tartrate (LOPRESSOR) 25 MG tablet Take 0.5 tablets (12.5 mg total) by mouth 2 (two) times daily. Patient not taking: Reported on 7/41/2878 6/76/72   Delora Fuel, MD  traMADol Veatrice Bourbon) 50 MG  tablet Take 1 tablet (50 mg total) by mouth every 8 (eight) hours as needed. 01/31/16   Golden Circle, FNP    Family History Family History  Problem Relation Age of Onset  . Diabetes Mother   . Cancer Father   . Colon cancer Neg Hx   . Esophageal cancer Neg Hx   . Stomach cancer Neg Hx   . Rectal cancer Neg Hx     Social History Social History  Substance Use Topics  . Smoking status: Former Smoker    Packs/day: 0.50    Years: 20.00    Types: Cigarettes    Quit date: 03/04/1978  . Smokeless tobacco: Never Used  . Alcohol use Yes     Comment: 10/30/2015 "might have 2-3 drinks/year"     Allergies   Patient has no known  allergies.   Review of Systems Review of Systems  Unable to perform ROS: Acuity of condition  Genitourinary: Positive for bladder incontinence.  Neurological: Positive for seizures.  Psychiatric/Behavioral: Positive for confusion.    Physical Exam Updated Vital Signs BP (!) 267/164   Pulse (!) 110   Temp 98.1 F (36.7 C)   Resp 15   Ht 5\' 8"  (1.727 m)   Wt 160 lb (72.6 kg)   SpO2 100%   BMI 24.33 kg/m   Physical Exam  Constitutional: He appears well-developed and well-nourished.  HENT:  Head: Normocephalic.  Abrasion on lower lip.   Eyes: Conjunctivae are normal.  Pupillary response to narcan.   Neck: Normal range of motion. Neck supple. No tracheal deviation present.  Cardiovascular: Intact distal pulses.  Tachycardia present.   Intact radial pulses and DP pulses.   Pulmonary/Chest: Effort normal and breath sounds normal. No respiratory distress.  Equal symmetric breath sounds  Abdominal: Soft. Bowel sounds are normal. He exhibits no distension and no mass. There is no tenderness. There is no rebound and no guarding.  Normal active bowel sounds. Abdomen soft.  Musculoskeletal: Normal range of motion. He exhibits no deformity.  Pelvis stable  Neurological: He displays normal reflexes. GCS eye subscore is 4. GCS verbal subscore is 1. GCS motor subscore is 3.  GCS 9  Skin: Skin is warm and dry. Capillary refill takes less than 2 seconds.  Multiple superficial lacerations on the upper part of his body that are healed   Psychiatric:  unable  Nursing note and vitals reviewed.    ED Treatments / Results   Vitals:   07/22/16 0102 07/22/16 0106  BP: (!) 267/164 (!) 234/119  Pulse:    Resp: 15 16  Temp:      DIAGNOSTIC STUDIES: Oxygen Saturation is 100% on RA, normal by my interpretation.    COORDINATION OF CARE: 11:01 PM Will order labs     Labs (all labs ordered are listed, but only abnormal results are displayed) Labs Reviewed  CBC WITH  DIFFERENTIAL/PLATELET - Abnormal; Notable for the following:       Result Value   Hemoglobin 11.8 (*)    HCT 35.3 (*)    All other components within normal limits  COMPREHENSIVE METABOLIC PANEL - Abnormal; Notable for the following:    Sodium 130 (*)    Chloride 95 (*)    CO2 21 (*)    Glucose, Bld 643 (*)    BUN 22 (*)    Creatinine, Ser 1.97 (*)    Albumin 3.3 (*)    GFR calc non Af Amer 34 (*)    GFR calc Af  Amer 39 (*)    All other components within normal limits  AMMONIA - Abnormal; Notable for the following:    Ammonia 40 (*)    All other components within normal limits  ACETAMINOPHEN LEVEL - Abnormal; Notable for the following:    Acetaminophen (Tylenol), Serum <10 (*)    All other components within normal limits  I-STAT CHEM 8, ED - Abnormal; Notable for the following:    Sodium 131 (*)    Chloride 95 (*)    BUN 23 (*)    Creatinine, Ser 1.90 (*)    Glucose, Bld 619 (*)    Calcium, Ion 1.12 (*)    Hemoglobin 12.6 (*)    HCT 37.0 (*)    All other components within normal limits  I-STAT CG4 LACTIC ACID, ED - Abnormal; Notable for the following:    Lactic Acid, Venous 5.57 (*)    All other components within normal limits  CBG MONITORING, ED - Abnormal; Notable for the following:    Glucose-Capillary >600 (*)    All other components within normal limits  I-STAT ARTERIAL BLOOD GAS, ED - Abnormal; Notable for the following:    pH, Arterial 7.320 (*)    pCO2 arterial 50.2 (*)    pO2, Arterial 215.0 (*)    All other components within normal limits  I-STAT ARTERIAL BLOOD GAS, ED - Abnormal; Notable for the following:    pH, Arterial 7.322 (*)    pCO2 arterial 53.5 (*)    pO2, Arterial 177.0 (*)    All other components within normal limits  SALICYLATE LEVEL  URINALYSIS, ROUTINE W REFLEX MICROSCOPIC  RAPID URINE DRUG SCREEN, HOSP PERFORMED    EKG  EKG Interpretation  Date/Time:  Sunday Jul 21 2016 22:54:08 EDT Ventricular Rate:  111 PR Interval:    QRS  Duration: 102 QT Interval:  353 QTC Calculation: 480 R Axis:   55 Text Interpretation:  Sinus tachycardia Probable left atrial enlargement Left ventricular hypertrophy Borderline prolonged QT interval Otherwise no significant change Confirmed by [facility] MD, PEDRO ([id number]) on 07/21/2016 10:59:25 PM       Radiology Ct Head Wo Contrast  Result Date: 07/22/2016 CLINICAL DATA:  Seizure EXAM: CT HEAD WITHOUT CONTRAST CT CERVICAL SPINE WITHOUT CONTRAST TECHNIQUE: Multidetector CT imaging of the head and cervical spine was performed following the standard protocol without intravenous contrast. Multiplanar CT image reconstructions of the cervical spine were also generated. COMPARISON:  None. FINDINGS: CT HEAD FINDINGS Brain: No mass lesion, intraparenchymal hemorrhage or extra-axial collection. No evidence of acute cortical infarct. There is periventricular hypoattenuation compatible with chronic microvascular disease. Vascular: No hyperdense vessel or unexpected calcification. Skull: Normal visualized skull base, calvarium and extracranial soft tissues. Sinuses/Orbits: No sinus fluid levels or advanced mucosal thickening. No mastoid effusion. Normal orbits. CT CERVICAL SPINE FINDINGS Alignment: No static subluxation. Facets are aligned. Occipital condyles are normally positioned. Skull base and vertebrae: No acute fracture. Soft tissues and spinal canal: No prevertebral fluid or swelling. No visible canal hematoma. Disc levels: There is lower cervical disc space narrowing with prominent osteophyte formation at C5-C6 and C6-C7. Disc osteophyte complexes contribute to moderate to severe neural foraminal stenosis bilaterally at C6-C7. Upper chest: No pneumothorax, pulmonary nodule or pleural effusion. Other: Normal visualized paraspinal cervical soft tissues. IMPRESSION: 1. Chronic microvascular ischemia without acute intracranial abnormality. 2. No acute fracture or static subluxation of the cervical spine. 3. Lower  cervical degenerative disc disease with moderate to severe bilateral foraminal stenosis at C6-C7. Electronically Signed  By: Ulyses Jarred M.D.   On: 07/22/2016 01:03   Ct Cervical Spine Wo Contrast  Result Date: 07/22/2016 CLINICAL DATA:  Seizure EXAM: CT HEAD WITHOUT CONTRAST CT CERVICAL SPINE WITHOUT CONTRAST TECHNIQUE: Multidetector CT imaging of the head and cervical spine was performed following the standard protocol without intravenous contrast. Multiplanar CT image reconstructions of the cervical spine were also generated. COMPARISON:  None. FINDINGS: CT HEAD FINDINGS Brain: No mass lesion, intraparenchymal hemorrhage or extra-axial collection. No evidence of acute cortical infarct. There is periventricular hypoattenuation compatible with chronic microvascular disease. Vascular: No hyperdense vessel or unexpected calcification. Skull: Normal visualized skull base, calvarium and extracranial soft tissues. Sinuses/Orbits: No sinus fluid levels or advanced mucosal thickening. No mastoid effusion. Normal orbits. CT CERVICAL SPINE FINDINGS Alignment: No static subluxation. Facets are aligned. Occipital condyles are normally positioned. Skull base and vertebrae: No acute fracture. Soft tissues and spinal canal: No prevertebral fluid or swelling. No visible canal hematoma. Disc levels: There is lower cervical disc space narrowing with prominent osteophyte formation at C5-C6 and C6-C7. Disc osteophyte complexes contribute to moderate to severe neural foraminal stenosis bilaterally at C6-C7. Upper chest: No pneumothorax, pulmonary nodule or pleural effusion. Other: Normal visualized paraspinal cervical soft tissues. IMPRESSION: 1. Chronic microvascular ischemia without acute intracranial abnormality. 2. No acute fracture or static subluxation of the cervical spine. 3. Lower cervical degenerative disc disease with moderate to severe bilateral foraminal stenosis at C6-C7. Electronically Signed   By: Ulyses Jarred  M.D.   On: 07/22/2016 01:03   Dg Chest Portable 1 View  Result Date: 07/22/2016 CLINICAL DATA:  Endotracheal tube placement.  Initial encounter. EXAM: PORTABLE CHEST 1 VIEW COMPARISON:  Chest radiograph performed 03/16/2016 FINDINGS: The patient's endotracheal tube is seen ending 5-6 cm above the carina. An enteric tube is noted extending below the diaphragm. The lungs are well-aerated and clear. There is no evidence of focal opacification, pleural effusion or pneumothorax. The cardiomediastinal silhouette is within normal limits. No acute osseous abnormalities are seen. A metallic BB is noted overlying the right midlung zone. IMPRESSION: 1. Endotracheal tube seen ending 5-6 cm above the carina. 2. No acute cardiopulmonary process seen. Electronically Signed   By: Garald Balding M.D.   On: 07/22/2016 00:25    Procedures Procedures (including critical care time)  EMERGENT INTUBATION PROCEDURE NOTE INDICATION: impending airway compromise  TECHNIQUE: Unable to obtain consent because of altered level of consciousness.  After pre-oxygenating the patient for 3 minutes, a modified rapid-sequence induction was performed using 30 etomidate and 80 rocuronium with cricoid pressure. Using a Gledescope 4 laryngoscope blade and 7.22mm cuffed endotracheal tube was placed and secured.  Placement was confirmed with by auscultation and by CXR. COMPLICATIONS: None. The patient tolerated the procedure well with no complications. POST PROCEDURE CXR: tube position acceptable    MDM Number of Diagnoses or Management Options Respiratory arrest [facility]):  Seizure ([place]):  Type 1 diabetes mellitus with ketoacidotic coma [facility]):  Critical Care Total time providing critical care: > 105 minutes  CRITICAL CARE Performed by: Veatrice Kells, MD  Total critical care time: 120  minutes Critical care time was exclusive of separately billable procedures and treating other patients. Critical care was necessary to treat or  prevent imminent or life-threatening deterioration. Critical care was time spent personally by me on the following activities: development of treatment plan with patient and/or surrogate as well as nursing, discussions with consultants, evaluation of patient's response to treatment, examination of patient, obtaining history from patient or surrogate,  ordering and performing treatments and interventions, ordering and review of laboratory studies, ordering and review of radiographic studies, pulse oximetry and re-evaluation of patient's condition.   Medications Ordered in ED Medications  sodium chloride 0.9 % bolus 1,000 mL (0 mLs Intravenous Stopped 07/22/16 0110)    And  0.9 %  sodium chloride infusion (not administered)  dextrose 5 %-0.45 % sodium chloride infusion (not administered)  insulin regular (NOVOLIN R,HUMULIN R) 100 Units in sodium chloride 0.9 % 100 mL (1 Units/mL) infusion (not administered)  sodium chloride 0.9 % bolus 1,000 mL (not administered)    And  0.9 %  sodium chloride infusion (not administered)  vecuronium (NORCURON) injection 10 mg (not administered)  propofol (DIPRIVAN) 1000 MG/100ML infusion (5 mcg/kg/min  72.6 kg Intravenous New Bag/Given 07/22/16 0032)  propofol (DIPRIVAN) 1000 MG/100ML infusion (  Hold 07/22/16 0045)  naloxone [facility]) injection 0.4 mg (0.4 mg Intravenous Given 07/21/16 2315)  levETIRAcetam (KEPPRA) 1,000 mg in sodium chloride 0.9 % 100 mL IVPB (0 mg Intravenous Stopped 07/22/16 0000)  etomidate (AMIDATE) injection (30 mg Intravenous Given 07/22/16 0006)  rocuronium (ZEMURON) injection (80 mg Intravenous Given 07/22/16 0006)     Initial Impression / Assessment and Plan / ED Course  I have reviewed the triage vital signs and the nursing notes.  Pertinent labs & imaging results that were available during my care of the patient were reviewed by me and considered in my medical decision making (see chart for details).  Clinical Course as of Jul 23 110   Sun Jul 21, 2016  2329 Put in consult to neuro   [EM]  2347 Consulted with neuro, who recommends intubation  [EM]  Mon Jul 22, 2016  0112 Consulted with hospitalist, who will admit the pt.   [EM]    Clinical Course User Index [EM] Hansel Feinstein    EKG Interpretation  Date/Time:  Sunday Jul 21 2016 22:54:08 EDT Ventricular Rate:  111 PR Interval:    QRS Duration: 102 QT Interval:  353 QTC Calculation: 480 R Axis:   55 Text Interpretation:  Sinus tachycardia Probable left atrial enlargement Left ventricular hypertrophy Borderline prolonged QT interval Otherwise no significant change Confirmed by [facility] MD, PEDRO ([id number]) on 07/21/2016 10:59:25 PM       MDM Reviewed: previous chart, nursing note and vitals Interpretation: labs, ECG and x-ray (elevated lactate, elevated creatinine and BUN,  Elevated sugar 643 with elevated anion gap, elevated ammonia ) Total time providing critical care: > 105 minutes. This excludes time spent performing separately reportable procedures and services. Consults: critical care and neurology  EDMEDS:  Medications  sodium chloride 0.9 % bolus 1,000 mL (0 mLs Intravenous Stopped 07/22/16 0110)    And  0.9 %  sodium chloride infusion (not administered)  dextrose 5 %-0.45 % sodium chloride infusion (not administered)  insulin regular (NOVOLIN R,HUMULIN R) 100 Units in sodium chloride 0.9 % 100 mL (1 Units/mL) infusion (not administered)  sodium chloride 0.9 % bolus 1,000 mL (not administered)    And  0.9 %  sodium chloride infusion (not administered)  vecuronium (NORCURON) injection 10 mg (not administered)  propofol (DIPRIVAN) 1000 MG/100ML infusion (5 mcg/kg/min  72.6 kg Intravenous New Bag/Given 07/22/16 0032)  propofol (DIPRIVAN) 1000 MG/100ML infusion (  Hold 07/22/16 0045)  naloxone [facility]) injection 0.4 mg (0.4 mg Intravenous Given 07/21/16 2315)  levETIRAcetam (KEPPRA) 1,000 mg in sodium chloride 0.9 % 100 mL IVPB (0 mg Intravenous Stopped  07/22/16 0000)  etomidate (AMIDATE) injection (30 mg Intravenous  Given 07/22/16 0006)  rocuronium [facility]) injection (80 mg Intravenous Given 07/22/16 0006)    Final Clinical Impressions(s) / ED Diagnoses   Final diagnoses:  Seizure ([place])  Type 1 diabetes mellitus with ketoacidotic coma ([place])  Respiratory arrest [facility])   Will admit to the ICU    Yaseen Gilberg, MD 07/22/16 [id number]

## 2016-07-21 NOTE — ED Notes (Signed)
Pt removed from the [facility]sb c-collar in place

## 2016-07-21 NOTE — ED Provider Notes (Signed)
EMERGENCY DEPARTMENT  US GUIDANCE EXAM Emergency Ultrasound:  US Guidance for Needle Guidance  INDICATIONS: Difficult vascular access Linear probe used in real-time to visualize location of needle entry through skin.   PERFORMED BY: Myself IMAGES ARCHIVED?: Yes LIMITATIONS: None VIEWS USED: Transverse INTERPRETATION: Right arm and Needle gauge 18    Heriberto Antigua, MD 07/21/16 2337    Fatima Blank, MD 07/24/16 [phone]

## 2016-07-22 ENCOUNTER — Emergency Department (HOSPITAL_COMMUNITY): Payer: Medicare HMO

## 2016-07-22 ENCOUNTER — Inpatient Hospital Stay (HOSPITAL_COMMUNITY): Payer: Medicare HMO

## 2016-07-22 ENCOUNTER — Encounter (HOSPITAL_COMMUNITY): Payer: Self-pay | Admitting: Emergency Medicine

## 2016-07-22 DIAGNOSIS — I69398 Other sequelae of cerebral infarction: Secondary | ICD-10-CM | POA: Diagnosis not present

## 2016-07-22 DIAGNOSIS — R569 Unspecified convulsions: Secondary | ICD-10-CM

## 2016-07-22 DIAGNOSIS — E785 Hyperlipidemia, unspecified: Secondary | ICD-10-CM | POA: Diagnosis present

## 2016-07-22 DIAGNOSIS — Z86010 Personal history of colonic polyps: Secondary | ICD-10-CM | POA: Diagnosis not present

## 2016-07-22 DIAGNOSIS — I639 Cerebral infarction, unspecified: Secondary | ICD-10-CM

## 2016-07-22 DIAGNOSIS — E1011 Type 1 diabetes mellitus with ketoacidosis with coma: Secondary | ICD-10-CM | POA: Diagnosis not present

## 2016-07-22 DIAGNOSIS — N183 Chronic kidney disease, stage 3 (moderate): Secondary | ICD-10-CM | POA: Diagnosis present

## 2016-07-22 DIAGNOSIS — Z79899 Other long term (current) drug therapy: Secondary | ICD-10-CM | POA: Diagnosis not present

## 2016-07-22 DIAGNOSIS — G9340 Encephalopathy, unspecified: Secondary | ICD-10-CM | POA: Diagnosis present

## 2016-07-22 DIAGNOSIS — I6522 Occlusion and stenosis of left carotid artery: Secondary | ICD-10-CM | POA: Diagnosis present

## 2016-07-22 DIAGNOSIS — I6789 Other cerebrovascular disease: Secondary | ICD-10-CM | POA: Diagnosis not present

## 2016-07-22 DIAGNOSIS — G9341 Metabolic encephalopathy: Secondary | ICD-10-CM

## 2016-07-22 DIAGNOSIS — G40901 Epilepsy, unspecified, not intractable, with status epilepticus: Secondary | ICD-10-CM | POA: Diagnosis present

## 2016-07-22 DIAGNOSIS — I161 Hypertensive emergency: Secondary | ICD-10-CM | POA: Diagnosis present

## 2016-07-22 DIAGNOSIS — R451 Restlessness and agitation: Secondary | ICD-10-CM | POA: Diagnosis not present

## 2016-07-22 DIAGNOSIS — R339 Retention of urine, unspecified: Secondary | ICD-10-CM | POA: Diagnosis not present

## 2016-07-22 DIAGNOSIS — E1122 Type 2 diabetes mellitus with diabetic chronic kidney disease: Secondary | ICD-10-CM | POA: Diagnosis present

## 2016-07-22 DIAGNOSIS — Z87891 Personal history of nicotine dependence: Secondary | ICD-10-CM | POA: Diagnosis not present

## 2016-07-22 DIAGNOSIS — R269 Unspecified abnormalities of gait and mobility: Secondary | ICD-10-CM | POA: Diagnosis not present

## 2016-07-22 DIAGNOSIS — J9600 Acute respiratory failure, unspecified whether with hypoxia or hypercapnia: Secondary | ICD-10-CM | POA: Diagnosis present

## 2016-07-22 DIAGNOSIS — E1159 Type 2 diabetes mellitus with other circulatory complications: Secondary | ICD-10-CM | POA: Diagnosis not present

## 2016-07-22 DIAGNOSIS — D509 Iron deficiency anemia, unspecified: Secondary | ICD-10-CM | POA: Diagnosis present

## 2016-07-22 DIAGNOSIS — R32 Unspecified urinary incontinence: Secondary | ICD-10-CM | POA: Diagnosis not present

## 2016-07-22 DIAGNOSIS — K580 Irritable bowel syndrome with diarrhea: Secondary | ICD-10-CM | POA: Diagnosis present

## 2016-07-22 DIAGNOSIS — E1165 Type 2 diabetes mellitus with hyperglycemia: Secondary | ICD-10-CM | POA: Diagnosis present

## 2016-07-22 DIAGNOSIS — R092 Respiratory arrest: Secondary | ICD-10-CM | POA: Diagnosis present

## 2016-07-22 DIAGNOSIS — Z794 Long term (current) use of insulin: Secondary | ICD-10-CM | POA: Diagnosis not present

## 2016-07-22 DIAGNOSIS — E1151 Type 2 diabetes mellitus with diabetic peripheral angiopathy without gangrene: Secondary | ICD-10-CM | POA: Diagnosis present

## 2016-07-22 DIAGNOSIS — R4182 Altered mental status, unspecified: Secondary | ICD-10-CM | POA: Diagnosis present

## 2016-07-22 LAB — GLUCOSE, CAPILLARY
GLUCOSE-CAPILLARY: 346 mg/dL — AB (ref 65–99)
GLUCOSE-CAPILLARY: 238 mg/dL — AB (ref 65–99)
GLUCOSE-CAPILLARY: 174 mg/dL — AB (ref 65–99)
Glucose-Capillary: 517 mg/dL (ref 65–99)
Glucose-Capillary: 341 mg/dL — ABNORMAL HIGH (ref 65–99)
Glucose-Capillary: 124 mg/dL — ABNORMAL HIGH (ref 65–99)
Glucose-Capillary: 129 mg/dL — ABNORMAL HIGH (ref 65–99)
Glucose-Capillary: 163 mg/dL — ABNORMAL HIGH (ref 65–99)
Glucose-Capillary: 211 mg/dL — ABNORMAL HIGH (ref 65–99)
Glucose-Capillary: 187 mg/dL — ABNORMAL HIGH (ref 65–99)

## 2016-07-22 LAB — BASIC METABOLIC PANEL
Anion gap: 13 (ref 5–15)
Anion gap: 8 (ref 5–15)
Anion gap: 11 (ref 5–15)
Anion gap: 10 (ref 5–15)
BUN: 23 mg/dL — ABNORMAL HIGH (ref 6–20)
BUN: 20 mg/dL (ref 6–20)
BUN: 22 mg/dL — AB (ref 6–20)
BUN: 24 mg/dL — AB (ref 6–20)
CHLORIDE: 99 mmol/L — AB (ref 101–111)
CHLORIDE: 106 mmol/L (ref 101–111)
CHLORIDE: 106 mmol/L (ref 101–111)
CHLORIDE: 106 mmol/L (ref 101–111)
CO2: 20 mmol/L — ABNORMAL LOW (ref 22–32)
CO2: 22 mmol/L (ref 22–32)
CO2: 22 mmol/L (ref 22–32)
CO2: 21 mmol/L — AB (ref 22–32)
CREATININE: 1.76 mg/dL — AB (ref 0.61–1.24)
CREATININE: 1.74 mg/dL — AB (ref 0.61–1.24)
CREATININE: 1.84 mg/dL — AB (ref 0.61–1.24)
CREATININE: 1.98 mg/dL — AB (ref 0.61–1.24)
Calcium: 8.8 mg/dL — ABNORMAL LOW (ref 8.9–10.3)
Calcium: 7.5 mg/dL — ABNORMAL LOW (ref 8.9–10.3)
Calcium: 8.9 mg/dL (ref 8.9–10.3)
Calcium: 8.3 mg/dL — ABNORMAL LOW (ref 8.9–10.3)
GFR calc Af Amer: 45 mL/min — ABNORMAL LOW (ref >60)
GFR calc Af Amer: 46 mL/min — ABNORMAL LOW (ref >60)
GFR calc Af Amer: 43 mL/min — ABNORMAL LOW (ref >60)
GFR calc Af Amer: 39 mL/min — ABNORMAL LOW (ref >60)
GFR calc non Af Amer: 39 mL/min — ABNORMAL LOW (ref >60)
GFR calc non Af Amer: 39 mL/min — ABNORMAL LOW (ref >60)
GFR calc non Af Amer: 37 mL/min — ABNORMAL LOW (ref >60)
GFR calc non Af Amer: 34 mL/min — ABNORMAL LOW (ref >60)
GLUCOSE: 578 mg/dL — AB (ref 65–99)
GLUCOSE: 193 mg/dL — AB (ref 65–99)
GLUCOSE: 220 mg/dL — AB (ref 65–99)
Glucose, Bld: 250 mg/dL — ABNORMAL HIGH (ref 65–99)
POTASSIUM: 4.1 mmol/L (ref 3.5–5.1)
POTASSIUM: 3.3 mmol/L — AB (ref 3.5–5.1)
POTASSIUM: 3.9 mmol/L (ref 3.5–5.1)
POTASSIUM: 3.7 mmol/L (ref 3.5–5.1)
SODIUM: 132 mmol/L — AB (ref 135–145)
SODIUM: 136 mmol/L (ref 135–145)
SODIUM: 139 mmol/L (ref 135–145)
SODIUM: 137 mmol/L (ref 135–145)

## 2016-07-22 LAB — I-STAT ARTERIAL BLOOD GAS, ED
Acid-Base Excess: 1 mmol/L (ref 0.0–2.0)
BICARBONATE: 27.7 mmol/L (ref 20.0–28.0)
O2 Saturation: 99 %
PCO2 ART: 53.5 mmHg — AB (ref 32.0–48.0)
PH ART: 7.322 — AB (ref 7.350–7.450)
Patient temperature: 98.1
TCO2: 29 mmol/L (ref 0–100)
pO2, Arterial: 177 mmHg — ABNORMAL HIGH (ref 83.0–108.0)

## 2016-07-22 LAB — MRSA PCR SCREENING: MRSA by PCR: NEGATIVE

## 2016-07-22 LAB — MAGNESIUM: Magnesium: 1.5 mg/dL — ABNORMAL LOW (ref 1.7–2.4)

## 2016-07-22 LAB — PHOSPHORUS: Phosphorus: 2.9 mg/dL (ref 2.5–4.6)

## 2016-07-22 LAB — URINALYSIS, ROUTINE W REFLEX MICROSCOPIC
BACTERIA UA: NONE SEEN
Bilirubin Urine: NEGATIVE
KETONES UR: NEGATIVE mg/dL
Leukocytes, UA: NEGATIVE
Nitrite: NEGATIVE
SPECIFIC GRAVITY, URINE: 1.014 (ref 1.005–1.030)
SQUAMOUS EPITHELIAL / LPF: NONE SEEN
pH: 7 (ref 5.0–8.0)

## 2016-07-22 LAB — CBC
HEMATOCRIT: 35.6 % — AB (ref 39.0–52.0)
Hemoglobin: 12.1 g/dL — ABNORMAL LOW (ref 13.0–17.0)
MCH: 26.7 pg (ref 26.0–34.0)
MCHC: 34 g/dL (ref 30.0–36.0)
MCV: 78.4 fL (ref 78.0–100.0)
PLATELETS: 180 10*3/uL (ref 150–400)
RBC: 4.54 MIL/uL (ref 4.22–5.81)
RDW: 12.5 % (ref 11.5–15.5)
WBC: 12 10*3/uL — AB (ref 4.0–10.5)

## 2016-07-22 LAB — RAPID URINE DRUG SCREEN, HOSP PERFORMED
AMPHETAMINES: NOT DETECTED
Barbiturates: NOT DETECTED
Benzodiazepines: POSITIVE — AB
Cocaine: NOT DETECTED
OPIATES: NOT DETECTED
TETRAHYDROCANNABINOL: NOT DETECTED

## 2016-07-22 LAB — TROPONIN I
TROPONIN I: 0.14 ng/mL — AB (ref <0.03)
Troponin I: 0.06 ng/mL (ref <0.03)
Troponin I: 0.13 ng/mL (ref <0.03)

## 2016-07-22 LAB — BETA-HYDROXYBUTYRIC ACID: BETA-HYDROXYBUTYRIC ACID: 1.32 mmol/L — AB (ref 0.05–0.27)

## 2016-07-22 LAB — TRIGLYCERIDES: TRIGLYCERIDES: 191 mg/dL — AB (ref <150)

## 2016-07-22 LAB — I-STAT CG4 LACTIC ACID, ED: Lactic Acid, Venous: 1.79 mmol/L (ref 0.5–1.9)

## 2016-07-22 MED ORDER — NICARDIPINE HCL IN NACL 20-0.86 MG/200ML-% IV SOLN: 3.0000 mg/h | INTRAVENOUS | Status: DC

## 2016-07-22 MED ORDER — PROPOFOL 1000 MG/100ML IV EMUL
0.0000 ug/kg/min | INTRAVENOUS | Status: DC
  Administered 2016-07-22: 30 ug/kg/min via INTRAVENOUS
  Administered 2016-07-22: 25 ug/kg/min via INTRAVENOUS
  Administered 2016-07-22: 30 ug/kg/min via INTRAVENOUS
  Administered 2016-07-23: 30.073 ug/kg/min via INTRAVENOUS
  Administered 2016-07-23 (×2): 20 ug/kg/min via INTRAVENOUS
  Administered 2016-07-24: 35 ug/kg/min via INTRAVENOUS
  Filled 2016-07-22 (×8): qty 100

## 2016-07-22 MED ORDER — FREE WATER
100.0000 mL | Freq: Four times a day (QID) | Status: DC
  Administered 2016-07-22 – 2016-07-24 (×8): 100 mL

## 2016-07-22 MED ORDER — DOCUSATE SODIUM 50 MG/5ML PO LIQD
100.0000 mg | Freq: Two times a day (BID) | ORAL | Status: DC | PRN
  Filled 2016-07-22: qty 10

## 2016-07-22 MED ORDER — LEVETIRACETAM 100 MG/ML PO SOLN
1000.0000 mg | Freq: Two times a day (BID) | ORAL | Status: DC
  Administered 2016-07-22 – 2016-07-23 (×3): 1000 mg
  Filled 2016-07-22 (×5): qty 10

## 2016-07-22 MED ORDER — SODIUM CHLORIDE 0.9 % IV SOLN
INTRAVENOUS | Status: DC
  Administered 2016-07-22: 06:00:00 via INTRAVENOUS

## 2016-07-22 MED ORDER — SODIUM CHLORIDE 0.9 % IV BOLUS (SEPSIS)
500.0000 mL | Freq: Once | INTRAVENOUS | Status: AC
  Administered 2016-07-22: 500 mL via INTRAVENOUS

## 2016-07-22 MED ORDER — VITAL AF 1.2 CAL PO LIQD
1000.0000 mL | ORAL | Status: DC
  Administered 2016-07-22: 1000 mL

## 2016-07-22 MED ORDER — SODIUM CHLORIDE 0.9 % IV BOLUS (SEPSIS)
1000.0000 mL | Freq: Once | INTRAVENOUS | Status: AC
  Administered 2016-07-22: 1000 mL via INTRAVENOUS

## 2016-07-22 MED ORDER — GADOBENATE DIMEGLUMINE 529 MG/ML IV SOLN
7.0000 mL | Freq: Once | INTRAVENOUS | Status: AC | PRN
  Administered 2016-07-22: 7 mL via INTRAVENOUS

## 2016-07-22 MED ORDER — FENTANYL CITRATE (PF) 100 MCG/2ML IJ SOLN
50.0000 ug | Freq: Once | INTRAMUSCULAR | Status: AC
  Administered 2016-07-22: 50 ug via INTRAVENOUS
  Filled 2016-07-22: qty 2

## 2016-07-22 MED ORDER — PROPOFOL 1000 MG/100ML IV EMUL
INTRAVENOUS | Status: AC
  Filled 2016-07-22: qty 100

## 2016-07-22 MED ORDER — ENOXAPARIN SODIUM 40 MG/0.4ML ~~LOC~~ SOLN
40.0000 mg | SUBCUTANEOUS | Status: DC
  Administered 2016-07-22 – 2016-07-27 (×5): 40 mg via SUBCUTANEOUS
  Filled 2016-07-22 (×5): qty 0.4

## 2016-07-22 MED ORDER — AMLODIPINE BESYLATE 5 MG PO TABS
5.0000 mg | ORAL_TABLET | Freq: Every day | ORAL | Status: DC
  Administered 2016-07-23 – 2016-07-27 (×5): 5 mg
  Filled 2016-07-22 (×5): qty 1

## 2016-07-22 MED ORDER — ORAL CARE MOUTH RINSE
15.0000 mL | OROMUCOSAL | Status: DC
  Administered 2016-07-22 – 2016-07-24 (×25): 15 mL via OROMUCOSAL

## 2016-07-22 MED ORDER — LABETALOL HCL 5 MG/ML IV SOLN
10.0000 mg | Freq: Once | INTRAVENOUS | Status: AC
  Administered 2016-07-22: 10 mg via INTRAVENOUS
  Filled 2016-07-22: qty 4

## 2016-07-22 MED ORDER — GABAPENTIN 250 MG/5ML PO SOLN
300.0000 mg | Freq: Three times a day (TID) | ORAL | Status: DC
  Administered 2016-07-22 – 2016-07-23 (×3): 300 mg
  Filled 2016-07-22 (×5): qty 6

## 2016-07-22 MED ORDER — MAGNESIUM SULFATE 2 GM/50ML IV SOLN
2.0000 g | Freq: Once | INTRAVENOUS | Status: AC
  Administered 2016-07-22: 2 g via INTRAVENOUS
  Filled 2016-07-22: qty 50

## 2016-07-22 MED ORDER — SODIUM CHLORIDE 0.9 % IV SOLN
INTRAVENOUS | Status: DC
  Filled 2016-07-22: qty 1

## 2016-07-22 MED ORDER — INSULIN GLARGINE 100 UNIT/ML ~~LOC~~ SOLN
10.0000 [IU] | Freq: Every day | SUBCUTANEOUS | Status: DC
Start: 2016-07-22 — End: 2016-07-23
  Administered 2016-07-22 – 2016-07-23 (×2): 10 [IU] via SUBCUTANEOUS
  Filled 2016-07-22 (×2): qty 0.1

## 2016-07-22 MED ORDER — ASPIRIN 325 MG PO TABS
325.0000 mg | ORAL_TABLET | Freq: Every day | ORAL | Status: DC
  Administered 2016-07-23 – 2016-07-27 (×5): 325 mg
  Filled 2016-07-22 (×6): qty 1

## 2016-07-22 MED ORDER — FENTANYL CITRATE (PF) 100 MCG/2ML IJ SOLN
INTRAMUSCULAR | Status: AC
  Administered 2016-07-22: 100 ug
  Filled 2016-07-22: qty 2

## 2016-07-22 MED ORDER — ETOMIDATE 2 MG/ML IV SOLN
INTRAVENOUS | Status: AC | PRN
Start: 2016-07-22 — End: 2016-07-22
  Administered 2016-07-22: 30 mg via INTRAVENOUS

## 2016-07-22 MED ORDER — GABAPENTIN 300 MG PO CAPS
300.0000 mg | ORAL_CAPSULE | Freq: Three times a day (TID) | ORAL | Status: DC
  Filled 2016-07-22: qty 1

## 2016-07-22 MED ORDER — FAMOTIDINE IN NACL 20-0.9 MG/50ML-% IV SOLN
20.0000 mg | INTRAVENOUS | Status: DC
  Administered 2016-07-22: 20 mg via INTRAVENOUS
  Filled 2016-07-22: qty 50

## 2016-07-22 MED ORDER — INSULIN ASPART 100 UNIT/ML ~~LOC~~ SOLN
0.0000 [IU] | SUBCUTANEOUS | Status: DC
  Administered 2016-07-22: 5 [IU] via SUBCUTANEOUS
  Administered 2016-07-22 (×2): 3 [IU] via SUBCUTANEOUS
  Administered 2016-07-23: 5 [IU] via SUBCUTANEOUS
  Administered 2016-07-23: 3 [IU] via SUBCUTANEOUS
  Administered 2016-07-23: 8 [IU] via SUBCUTANEOUS
  Administered 2016-07-23: 3 [IU] via SUBCUTANEOUS
  Administered 2016-07-23 (×2): 5 [IU] via SUBCUTANEOUS
  Administered 2016-07-24 (×2): 3 [IU] via SUBCUTANEOUS
  Administered 2016-07-24: 5 [IU] via SUBCUTANEOUS
  Administered 2016-07-25: 8 [IU] via SUBCUTANEOUS
  Administered 2016-07-25: 5 [IU] via SUBCUTANEOUS
  Administered 2016-07-25: 2 [IU] via SUBCUTANEOUS
  Administered 2016-07-25: 3 [IU] via SUBCUTANEOUS
  Administered 2016-07-25 – 2016-07-26 (×3): 5 [IU] via SUBCUTANEOUS
  Administered 2016-07-26 (×2): 3 [IU] via SUBCUTANEOUS
  Administered 2016-07-26: 5 [IU] via SUBCUTANEOUS
  Administered 2016-07-27 (×2): 3 [IU] via SUBCUTANEOUS

## 2016-07-22 MED ORDER — POTASSIUM CHLORIDE 10 MEQ/100ML IV SOLN: 10.0000 meq | INTRAVENOUS | Status: AC

## 2016-07-22 MED ORDER — SODIUM CHLORIDE 0.9 % IV SOLN: INTRAVENOUS | Status: AC

## 2016-07-22 MED ORDER — CHLORHEXIDINE GLUCONATE 0.12% ORAL RINSE (MEDLINE KIT)
15.0000 mL | Freq: Two times a day (BID) | OROMUCOSAL | Status: DC
  Administered 2016-07-22 – 2016-07-24 (×5): 15 mL via OROMUCOSAL

## 2016-07-22 MED ORDER — NICARDIPINE HCL IN NACL 20-0.86 MG/200ML-% IV SOLN
INTRAVENOUS | Status: AC
  Administered 2016-07-22: 20 mg
  Filled 2016-07-22: qty 200

## 2016-07-22 MED ORDER — VECURONIUM BROMIDE 10 MG IV SOLR: 10.0000 mg | Freq: Once | INTRAVENOUS | Status: DC

## 2016-07-22 MED ORDER — ATORVASTATIN CALCIUM 80 MG PO TABS
80.0000 mg | ORAL_TABLET | Freq: Every day | ORAL | Status: DC
  Administered 2016-07-22 – 2016-07-26 (×4): 80 mg
  Filled 2016-07-22 (×5): qty 1

## 2016-07-22 MED ORDER — PROPOFOL 1000 MG/100ML IV EMUL
5.0000 ug/kg/min | INTRAVENOUS | Status: DC
Start: 2016-07-22 — End: 2016-07-22
  Administered 2016-07-22: 5 ug/kg/min via INTRAVENOUS

## 2016-07-22 MED ORDER — DEXTROSE-NACL 5-0.45 % IV SOLN
INTRAVENOUS | Status: DC
  Administered 2016-07-22: 08:00:00 via INTRAVENOUS

## 2016-07-22 MED ORDER — FENTANYL CITRATE (PF) 100 MCG/2ML IJ SOLN
50.0000 ug | INTRAMUSCULAR | Status: DC | PRN
  Filled 2016-07-22 (×4): qty 2

## 2016-07-22 MED ORDER — METOPROLOL TARTRATE 12.5 MG HALF TABLET
12.5000 mg | ORAL_TABLET | Freq: Two times a day (BID) | ORAL | Status: DC
  Administered 2016-07-22 – 2016-07-27 (×9): 12.5 mg
  Filled 2016-07-22 (×10): qty 1

## 2016-07-22 MED ORDER — ROCURONIUM BROMIDE 50 MG/5ML IV SOLN
INTRAVENOUS | Status: AC | PRN
  Administered 2016-07-22: 80 mg via INTRAVENOUS

## 2016-07-22 MED ORDER — ACETAMINOPHEN 160 MG/5ML PO SOLN
650.0000 mg | Freq: Four times a day (QID) | ORAL | Status: DC | PRN
  Administered 2016-07-22: 650 mg via ORAL
  Filled 2016-07-22: qty 20.3

## 2016-07-22 MED ORDER — FENTANYL CITRATE (PF) 100 MCG/2ML IJ SOLN
50.0000 ug | INTRAMUSCULAR | Status: DC | PRN
  Administered 2016-07-22 – 2016-07-25 (×9): 50 ug via INTRAVENOUS
  Filled 2016-07-22 (×7): qty 2

## 2016-07-22 NOTE — Progress Notes (Addendum)
PULMONARY / CRITICAL CARE MEDICINE   Name: Richard Bennett MRN: [id number] DOB: 02-Oct-1950    ADMISSION DATE:  07/21/2016  CHIEF COMPLAINT:  Seizure   HISTORY OF PRESENT ILLNESS:   66 yo male Jehovah's Witness (does not accept blood products) with h/o DM, HTN, ETOH and polysubstance (including IVDA) abuse.BIBA found to be having focal seizure in the parking lot of his apartment complex.  EMS gave 5mg  versed in route.  Had long post ictal phase and was [place].  In the ED 1g of Keppra IV, CT head was unremarkable. He was intubated for airway protection and admitted to ICU.   SUBJECTIVE: No acute events overnight. Reportedly very agitated when sedation lowered. No further seizure activity noted on propofol.  VITAL SIGNS: Temp:  [98.1 F (36.7 C)-99.1 F (37.3 C)] 99.1 F (37.3 C) (05/21 0430) Pulse Rate:  [84-120] 95 (05/21 0700) Resp:  [12-20] 18 (05/21 0700) BP: (103-267)/(67-164) 136/87 (05/21 0700) SpO2:  [95 %-100 %] 100 % (05/21 0700) FiO2 (%):  [40 %-100 %] 40 % (05/21 0430) Weight:  [72.6 kg (160 lb)] 72.6 kg (160 lb) (05/21 0004) HEMODYNAMICS:   VENTILATOR SETTINGS: Vent Mode: PRVC FiO2 (%):  [40 %-100 %] 40 % Set Rate:  [14 bmp-18 bmp] 18 bmp Vt Set:  [550 mL] 550 mL PEEP:  [5 cmH20] 5 cmH20 Plateau Pressure:  [14 cmH20-15 cmH20] 15 cmH20 INTAKE / OUTPUT:  Intake/Output Summary (Last 24 hours) at 07/22/16 0737 Last data filed at 07/22/16 0500  Gross per 24 hour  Intake          1814.17 ml  Output              800 ml  Net          1014.17 ml    PHYSICAL EXAMINATION: General:  Middle aged male of normal body habitus in NAD Neuro:  Very agitated and unable to follow commands on WUA. Moves all 4 extremities HEENT:  Right eye supraorbital edema, PERLA Pupils 40mm Cardiovascular:  Tachycardic, no MRG Lungs:  Clear bilateral breath sounds, vent assisted Abdomen:  Soft, non-tender, non-distended Musculoskeletal:  Normal bulk/tone. No acute deformity or ROM  limitation Skin:  Grossly intact LABS:  CBC  Recent Labs Lab 07/21/16 2304 07/21/16 2316 07/22/16 0346  WBC 5.5  --  12.0*  HGB 11.8* 12.6* 12.1*  HCT 35.3* 37.0* 35.6*  PLT 227  --  180   Coag's No results for input(s): APTT, INR in the last 168 hours. BMET  Recent Labs Lab 07/21/16 2304 07/21/16 2316 07/22/16 0346  NA 130* 131* 132*  K 4.1 4.1 4.1  CL 95* 95* 99*  CO2 21*  --  20*  BUN 22* 23* 23*  CREATININE 1.97* 1.90* 1.76*  GLUCOSE 643* 619* 578*   Electrolytes  Recent Labs Lab 07/21/16 2304 07/22/16 0346  CALCIUM 9.0 8.8*  MG  --  1.5*  PHOS  --  2.9   Sepsis Markers  Recent Labs Lab 07/21/16 2316 07/22/16 0247  LATICACIDVEN 5.57* 1.79   ABG  Recent Labs Lab 07/21/16 2309 07/22/16 0109  PHART 7.320* 7.322*  PCO2ART 50.2* 53.5*  PO2ART 215.0* 177.0*   Liver Enzymes  Recent Labs Lab 07/21/16 2304  AST 33  ALT 25  ALKPHOS 80  BILITOT 0.6  ALBUMIN 3.3*   Cardiac Enzymes  Recent Labs Lab 07/22/16 0346  TROPONINI 0.06*   Glucose  Recent Labs Lab 07/21/16 2256 07/22/16 0616 07/22/16 0701  GLUCAP >600* 346* 341*  Imaging Ct Head Wo Contrast  Result Date: 07/22/2016 CLINICAL DATA:  Seizure EXAM: CT HEAD WITHOUT CONTRAST CT CERVICAL SPINE WITHOUT CONTRAST TECHNIQUE: Multidetector CT imaging of the head and cervical spine was performed following the standard protocol without intravenous contrast. Multiplanar CT image reconstructions of the cervical spine were also generated. COMPARISON:  None. FINDINGS: CT HEAD FINDINGS Brain: No mass lesion, intraparenchymal hemorrhage or extra-axial collection. No evidence of acute cortical infarct. There is periventricular hypoattenuation compatible with chronic microvascular disease. Vascular: No hyperdense vessel or unexpected calcification. Skull: Normal visualized skull base, calvarium and extracranial soft tissues. Sinuses/Orbits: No sinus fluid levels or advanced mucosal thickening. No  mastoid effusion. Normal orbits. CT CERVICAL SPINE FINDINGS Alignment: No static subluxation. Facets are aligned. Occipital condyles are normally positioned. Skull base and vertebrae: No acute fracture. Soft tissues and spinal canal: No prevertebral fluid or swelling. No visible canal hematoma. Disc levels: There is lower cervical disc space narrowing with prominent osteophyte formation at C5-C6 and C6-C7. Disc osteophyte complexes contribute to moderate to severe neural foraminal stenosis bilaterally at C6-C7. Upper chest: No pneumothorax, pulmonary nodule or pleural effusion. Other: Normal visualized paraspinal cervical soft tissues. IMPRESSION: 1. Chronic microvascular ischemia without acute intracranial abnormality. 2. No acute fracture or static subluxation of the cervical spine. 3. Lower cervical degenerative disc disease with moderate to severe bilateral foraminal stenosis at C6-C7. Electronically Signed   By: Ulyses Jarred M.D.   On: 07/22/2016 01:03   Ct Cervical Spine Wo Contrast  Result Date: 07/22/2016 CLINICAL DATA:  Seizure EXAM: CT HEAD WITHOUT CONTRAST CT CERVICAL SPINE WITHOUT CONTRAST TECHNIQUE: Multidetector CT imaging of the head and cervical spine was performed following the standard protocol without intravenous contrast. Multiplanar CT image reconstructions of the cervical spine were also generated. COMPARISON:  None. FINDINGS: CT HEAD FINDINGS Brain: No mass lesion, intraparenchymal hemorrhage or extra-axial collection. No evidence of acute cortical infarct. There is periventricular hypoattenuation compatible with chronic microvascular disease. Vascular: No hyperdense vessel or unexpected calcification. Skull: Normal visualized skull base, calvarium and extracranial soft tissues. Sinuses/Orbits: No sinus fluid levels or advanced mucosal thickening. No mastoid effusion. Normal orbits. CT CERVICAL SPINE FINDINGS Alignment: No static subluxation. Facets are aligned. Occipital condyles are  normally positioned. Skull base and vertebrae: No acute fracture. Soft tissues and spinal canal: No prevertebral fluid or swelling. No visible canal hematoma. Disc levels: There is lower cervical disc space narrowing with prominent osteophyte formation at C5-C6 and C6-C7. Disc osteophyte complexes contribute to moderate to severe neural foraminal stenosis bilaterally at C6-C7. Upper chest: No pneumothorax, pulmonary nodule or pleural effusion. Other: Normal visualized paraspinal cervical soft tissues. IMPRESSION: 1. Chronic microvascular ischemia without acute intracranial abnormality. 2. No acute fracture or static subluxation of the cervical spine. 3. Lower cervical degenerative disc disease with moderate to severe bilateral foraminal stenosis at C6-C7. Electronically Signed   By: Ulyses Jarred M.D.   On: 07/22/2016 01:03   Dg Chest Portable 1 View  Result Date: 07/22/2016 CLINICAL DATA:  Left central line placement.  Initial encounter. EXAM: PORTABLE CHEST 1 VIEW COMPARISON:  Chest radiograph performed earlier today at 12:18 a.m. FINDINGS: The patient's left subclavian line is noted extending superiorly, likely ending at the right internal jugular vein. The patient's endotracheal tube is seen ending 4 cm above the carina. An enteric tube is noted extending below the diaphragm. The lungs appear clear bilaterally. No focal consolidation, pleural effusion or pneumothorax is seen. The cardiomediastinal silhouette is normal in size. No acute osseous abnormalities  are seen. A metallic BB is noted overlying the right midlung. IMPRESSION: 1. Left subclavian line noted extending superiorly, likely ending at the right internal jugular vein. This should be retracted 6-7 cm and repositioned. 2. Endotracheal tube seen ending 4 cm above the carina. 3. Lungs clear bilaterally. These results were called by telephone at the time of interpretation on 07/22/2016 at 2:53 am to Dr. Randal Buba, who verbally acknowledged these results.  Electronically Signed   By: Garald Balding M.D.   On: 07/22/2016 02:56   Dg Chest Portable 1 View  Result Date: 07/22/2016 CLINICAL DATA:  Endotracheal tube placement.  Initial encounter. EXAM: PORTABLE CHEST 1 VIEW COMPARISON:  Chest radiograph performed 03/16/2016 FINDINGS: The patient's endotracheal tube is seen ending 5-6 cm above the carina. An enteric tube is noted extending below the diaphragm. The lungs are well-aerated and clear. There is no evidence of focal opacification, pleural effusion or pneumothorax. The cardiomediastinal silhouette is within normal limits. No acute osseous abnormalities are seen. A metallic BB is noted overlying the right midlung zone. IMPRESSION: 1. Endotracheal tube seen ending 5-6 cm above the carina. 2. No acute cardiopulmonary process seen. Electronically Signed   By: Garald Balding M.D.   On: 07/22/2016 00:25     ASSESSMENT / PLAN: 66 yo AAF with new onset seizure and acute encephalopathy of unclear etiology, suspect HHS, hypertensive emergency vs metabolic encephalopathy. Intubated for airway protection and admitted to ICU. MRI and EEG pending. Cardene off  PULMONARY A: Intubated for airway protection Acute resp failure P:   Full vent support Daily SBT and sedation vacation ABG as clinically indicated VAP prevention bundle  CARDIOVASCULAR  A:  Hypertensive emergency Sinus tachycardia Hyperlipidemia  P:  Continuing home metoprolol, norvasc Nicardipine gtt goal SBP < 170 (now off) Continuing home Atorvastatin 80mg  Qday Will hope to gain PIV and DC femoral line Holding ARB in the setting of acute illness will restart tomorrow ASA 325mg  Qday  RENAL A:   CKD - at baseline Scr Hypomagnesemia  Lactatemia - 2/2 seizure  (cleared) P:   Supp mag Lactate cleared  GASTROINTESTINAL A:   Not active P:   PPI for GI ppx Had some vomiting overnight also HHS as below, will hold TF for now PRN Zofran (QTc 480)  HEMATOLOGIC A:   Not  active P:  Follow CBC  INFECTIOUS A:   Not active P:   BCx2 07/22/2016 UC none Sputum 07/22/2016 Abx: Holding for now  ENDOCRINE A:   HHS DM II HLD   P:   Continuing Insulin gtt with HHS/DKA protocol Aggressive volume resuscitation. 1 L additional now, then NS at 125/Hr  NEUROLOGIC A:   Acute encephalopathy likely metabolic Seizure - new onset likely metabolic History of polysubstance abuse (ETOH, IVDA. Benzos positive on UDS likely due to EMS dose. Co-ingestants negative)  P:   RASS goal: 0 to -1 Cannot clear C-spine now due to encephalopathy Keppra 1g BID Neurology following MRI without contrast EEG pending Propofol gtt for sedation, PRN fentanyl Would like to try on Precedex, but will wait until after MRI and EEG  DVT PPX: Lovenox Code status:  Full  FAMILY  - Updates: Brother updated early this morning, no new results at this time.   Georgann Housekeeper, AGACNP-BC [facility] Pager [phone] or [phone]  07/22/2016 8:04 AM  STAFF NOTE: Linwood Dibbles, MD FACP have personally reviewed patient's available data, including medical history, events of note, physical examination and test results as part of  my evaluation. I have discussed with resident/NP and other care providers such as pharmacist, RN and RRT. In addition, I personally evaluated patient and elicited key findings of: rass -2, propofol, perrl, lungs clear, abdo obese, no edema, I personally reviewed CT head / neck , neg acute noted, pcxr I personally reviewed without infiltrate, did have malpaced line, has a fem line which I would like to dc asap, he presents with found unresponsive, etiology unclear, CT head neg, r/o cva or other etiology needs MRI, would keep collar on until able to clear clinically, doubt hyperglycemia explains any of his presentation, controlling glu with insulin drip, for eeg, r/o seizure focus, keppra maintain, vomiting noted, hold feeds, may need kub  assessment, given we can not rule out cva , would have sys goals slight hiter, nicardipine about to restart possible, no role abx, may need LP, abg reviewed, keep about same or slight higher MV on vent, no sbt today The patient is critically ill with multiple organ systems failure and requires high complexity decision making for assessment and support, frequent evaluation and titration of therapies, application of advanced monitoring technologies and extensive interpretation of multiple databases.   Critical Care Time devoted to patient care services described in this note is 50 Minutes. This time reflects time of care of this signee: Merrie Roof, MD FACP. This critical care time does not reflect procedure time, or teaching time or supervisory time of PA/NP/Med student/Med Resident etc but could involve care discussion time. Rest per NP/medical resident whose note is outlined above and that I agree with   Lavon Paganini. Titus Mould, MD, [place] Pgr: [facility] 07/22/2016 8:54 AM

## 2016-07-22 NOTE — Progress Notes (Signed)
eLink Physician-Brief Progress Note Patient Name: Richard Bennett DOB: 1950/12/16 MRN: [id number]   Date of Service  07/22/2016  HPI/Events of Note  Pt came in with hyperglycemia.  Has baseline chronic kidney disease.  Off insulin drip.  On insulin sliding scale.   eICU Interventions  Will hold off on IV fluids for now. Patient is to start to feeds.      Intervention Category Major Interventions: Other:  Rush Landmark 07/22/2016, 3:58 PM

## 2016-07-22 NOTE — ED Notes (Signed)
Fentanyl 50 mcg IV given for the rest of prior vial for pt sedation.

## 2016-07-22 NOTE — ED Notes (Signed)
Dr. Arna Medici notified that Cardene gtt is stopped due to SBP lower than 150.

## 2016-07-22 NOTE — Progress Notes (Signed)
Troponin 0.13. Dr Titus Mould updated at bedside. Imani Sherrin, Rande Brunt, RN

## 2016-07-22 NOTE — Progress Notes (Signed)
Initial Nutrition Assessment  DOCUMENTATION CODES:   Not applicable  INTERVENTION:   If tube feeds initiated recommend Vital 1.2 @ goal rate 55ml/hr- Initiate at 69ml/hr and increase by 25ml/hr every eight hours until goal is reached.   Propofol: 13.1 ml/hr- provides 346kcal/day  Free water flushes 178ml every 6 hrs.   Tube feeds with propofol provides 1786kcal/day, 90g/day protein, 1337ml free water  NUTRITION DIAGNOSIS:   Inadequate oral intake related to other (see comment) (pt sedated on vent) as evidenced by NPO status.  GOAL:   Provide needs based on ASPEN/SCCM guidelines  MONITOR:   Diet advancement, Vent status, Labs, Weight trends, I & O's  REASON FOR ASSESSMENT:   Ventilator    ASSESSMENT:   66 yo male Jehovah's Witness (does not accept blood products) with h/o DM, HTN, ETOH and polysubstance (including IVDA) abuse.BIBA found to be having focal seizure in the parking lot of his apartment complex.  EMS gave 5mg  versed in route.  Had long post ictal phase and was [place].  In the ED 1g of Keppra IV, CT head was unremarkable. He was intubated for airway protection and admitted to ICU.    Spoke to RN; plan is to possibly extubate pt this evening. RN hoping to be able to start pt on oral diet. Pt woke up very agitated when sedation lowered. Pt scheduled to have EEG today. Pt with OGT in place; tube feed recs above if initiated. RD will monitor for diet advancement. Pt with hypokalemia; monitor and supplement as needed per MD discretion.   Medications reviewed and include: aspirin, lovenox, NaCl w/ dextrose, insulin, nicardipine, propofol, etomidate  Labs reviewed: K 3.3(L), creat 1.74(H), Ca 7.5(L) Wbc- 12.0(H) cbgs- 643, 619, 578, 250 x 24 hrs AIC 9.6(H)- 10/2015  Nutrition-Focused physical exam completed. Findings are no fat depletion, no muscle depletion, and no edema.   Patient is currently intubated on ventilator support MV: 9.7 L/min Temp (24hrs),  Avg:99.3 F (37.4 C), Min:98.1 F (36.7 C), Max:100.8 F (38.2 C)  Propofol: 13.1 ml/hr- provides 346kcal/day   Diet Order:   NPO  Skin:  Reviewed, no issues  Last BM:  none since admit  Height:   Ht Readings from Last 1 Encounters:  07/22/16 5\' 9"  (1.753 m)    Weight:   Wt Readings from Last 1 Encounters:  07/22/16 160 lb (72.6 kg)    Ideal Body Weight:  72.7 kg  BMI:  Body mass index is 23.63 kg/m.  Estimated Nutritional Needs:   Kcal:  1700-1800kcal/day   Protein:  80-95g/day   Fluid:  >1.7L/day   EDUCATION NEEDS:   No education needs identified at this time  Koleen Distance MS, RD, LDN Pager #- [phone]

## 2016-07-22 NOTE — H&P (Signed)
PULMONARY / CRITICAL CARE MEDICINE   Name: Richard Bennett MRN: [id number] DOB: 16-Apr-1950    ADMISSION DATE:  07/21/2016  CHIEF COMPLAINT:  Seizure   HISTORY OF PRESENT ILLNESS:   66 yo male Jehovah's Witness (does not accept blood products) with h/o DM, HTN, BIBA found to be having focal seizure in the parking lot of his apartment complex.  EMS gave 5mg  versed in route.  Had long post ictal phase and was [place].    In the ED 1g of Keppra IV, CT head was unremarkable.   PAST MEDICAL HISTORY :   has a past medical history of Chest pain; Colon polyps; Hyperlipidemia; Hypertension; Refusal of blood transfusions as patient is Jehovah's Witness; and Type II diabetes mellitus ([place]).  has a past surgical history that includes Appendectomy; Back surgery; and Lumbar disc surgery. Prior to Admission medications   Medication Sig Start Date End Date Taking? Authorizing Provider  amLODipine (NORVASC) 5 MG tablet Take 1 tablet (5 mg total) by mouth daily. 9/67/89   Delora Fuel, MD  gabapentin (NEURONTIN) 300 MG capsule Take 1 capsule (300 mg total) by mouth 3 (three) times daily. Patient not taking: Reported on 03/16/2016 02/01/16   Golden Circle, FNP  LANTUS SOLOSTAR 100 UNIT/ML Solostar Pen Inject 15 Units into the skin every morning. 3/81/01   Delora Fuel, MD  losartan (COZAAR) 100 MG tablet Take 100 mg by mouth daily.    [provider]  losartan (COZAAR) 50 MG tablet Take 1 tablet (50 mg total) by mouth daily. Patient not taking: Reported on 7/51/0258 07/28/76   Delora Fuel, MD  methocarbamol (ROBAXIN) 500 MG tablet Take 500 mg by mouth 4 (four) times daily. For muscle aches    [provider]  metoprolol tartrate (LOPRESSOR) 25 MG tablet Take 0.5 tablets (12.5 mg total) by mouth 2 (two) times daily. Patient not taking: Reported on 2/42/3536 1/44/31   Delora Fuel, MD  traMADol (ULTRAM) 50 MG tablet Take 1 tablet (50 mg total) by mouth every 8 (eight) hours as needed.  01/31/16   Golden Circle, FNP   No Known Allergies  FAMILY HISTORY:  indicated that his mother is deceased. He indicated that his father is deceased. He indicated that his maternal grandmother is deceased. He indicated that his maternal grandfather is deceased. He indicated that his paternal grandmother is deceased. He indicated that his paternal grandfather is deceased. He indicated that the status of his neg hx is unknown.   SOCIAL HISTORY:  reports that he quit smoking about 38 years ago. His smoking use included Cigarettes. He has a 10.00 pack-year smoking history. He has never used smokeless tobacco. He reports that he drinks alcohol. He reports that he uses drugs, including Heroin.  REVIEW OF SYSTEMS:  Unable to obtain  SUBJECTIVE:   VITAL SIGNS: Temp:  [98.1 F (36.7 C)] 98.1 F (36.7 C) (05/20 2255) Pulse Rate:  [110-120] 110 (05/20 2300) Resp:  [15-20] 16 (05/21 0106) BP: (151-267)/(104-164) 234/119 (05/21 0106) SpO2:  [100 %] 100 % (05/21 0004) FiO2 (%):  [100 %] 100 % (05/21 0004) Weight:  [72.6 kg (160 lb)] 72.6 kg (160 lb) (05/21 0004) HEMODYNAMICS:   VENTILATOR SETTINGS: Vent Mode: PRVC FiO2 (%):  [100 %] 100 % Set Rate:  [14 bmp-18 bmp] 18 bmp Vt Set:  [550 mL] 550 mL PEEP:  [5 cmH20] 5 cmH20 Plateau Pressure:  [14 cmH20] 14 cmH20 INTAKE / OUTPUT:  Intake/Output Summary (Last 24 hours) at 07/22/16 0119 Last data  filed at 07/22/16 0110  Gross per 24 hour  Intake             1110 ml  Output                0 ml  Net             1110 ml    PHYSICAL EXAMINATION: General:  Sedated, unarrousable  Neuro:  Moves left arm and leg spontaneously.  Withdraws to pain on left.  HEENT:  Right eye supraorbital edema, PERLA Pupils 9mm Cardiovascular:  Tachycardic, no m/r/g Lungs:  CTA b/l no w/r/r, + mechanical breath sounds Abdomen:  Soft, normal bowel sounds, no masses or ascitites Musculoskeletal:  Thin appearing Skin:  No c/c/e  LABS:  CBC  Recent  Labs Lab 07/21/16 2304 07/21/16 2316  WBC 5.5  --   HGB 11.8* 12.6*  HCT 35.3* 37.0*  PLT 227  --    Coag's No results for input(s): APTT, INR in the last 168 hours. BMET  Recent Labs Lab 07/21/16 2304 07/21/16 2316  NA 130* 131*  K 4.1 4.1  CL 95* 95*  CO2 21*  --   BUN 22* 23*  CREATININE 1.97* 1.90*  GLUCOSE 643* 619*   Electrolytes  Recent Labs Lab 07/21/16 2304  CALCIUM 9.0   Sepsis Markers  Recent Labs Lab 07/21/16 2316  LATICACIDVEN 5.57*   ABG  Recent Labs Lab 07/21/16 2309 07/22/16 0109  PHART 7.320* 7.322*  PCO2ART 50.2* 53.5*  PO2ART 215.0* 177.0*   Liver Enzymes  Recent Labs Lab 07/21/16 2304  AST 33  ALT 25  ALKPHOS 80  BILITOT 0.6  ALBUMIN 3.3*   Cardiac Enzymes No results for input(s): TROPONINI, PROBNP in the last 168 hours. Glucose  Recent Labs Lab 07/21/16 2256  GLUCAP >600*    Imaging Ct Head Wo Contrast  Result Date: 07/22/2016 CLINICAL DATA:  Seizure EXAM: CT HEAD WITHOUT CONTRAST CT CERVICAL SPINE WITHOUT CONTRAST TECHNIQUE: Multidetector CT imaging of the head and cervical spine was performed following the standard protocol without intravenous contrast. Multiplanar CT image reconstructions of the cervical spine were also generated. COMPARISON:  None. FINDINGS: CT HEAD FINDINGS Brain: No mass lesion, intraparenchymal hemorrhage or extra-axial collection. No evidence of acute cortical infarct. There is periventricular hypoattenuation compatible with chronic microvascular disease. Vascular: No hyperdense vessel or unexpected calcification. Skull: Normal visualized skull base, calvarium and extracranial soft tissues. Sinuses/Orbits: No sinus fluid levels or advanced mucosal thickening. No mastoid effusion. Normal orbits. CT CERVICAL SPINE FINDINGS Alignment: No static subluxation. Facets are aligned. Occipital condyles are normally positioned. Skull base and vertebrae: No acute fracture. Soft tissues and spinal canal: No  prevertebral fluid or swelling. No visible canal hematoma. Disc levels: There is lower cervical disc space narrowing with prominent osteophyte formation at C5-C6 and C6-C7. Disc osteophyte complexes contribute to moderate to severe neural foraminal stenosis bilaterally at C6-C7. Upper chest: No pneumothorax, pulmonary nodule or pleural effusion. Other: Normal visualized paraspinal cervical soft tissues. IMPRESSION: 1. Chronic microvascular ischemia without acute intracranial abnormality. 2. No acute fracture or static subluxation of the cervical spine. 3. Lower cervical degenerative disc disease with moderate to severe bilateral foraminal stenosis at C6-C7. Electronically Signed   By: Ulyses Jarred M.D.   On: 07/22/2016 01:03   Ct Cervical Spine Wo Contrast  Result Date: 07/22/2016 CLINICAL DATA:  Seizure EXAM: CT HEAD WITHOUT CONTRAST CT CERVICAL SPINE WITHOUT CONTRAST TECHNIQUE: Multidetector CT imaging of the head and cervical spine was performed  following the standard protocol without intravenous contrast. Multiplanar CT image reconstructions of the cervical spine were also generated. COMPARISON:  None. FINDINGS: CT HEAD FINDINGS Brain: No mass lesion, intraparenchymal hemorrhage or extra-axial collection. No evidence of acute cortical infarct. There is periventricular hypoattenuation compatible with chronic microvascular disease. Vascular: No hyperdense vessel or unexpected calcification. Skull: Normal visualized skull base, calvarium and extracranial soft tissues. Sinuses/Orbits: No sinus fluid levels or advanced mucosal thickening. No mastoid effusion. Normal orbits. CT CERVICAL SPINE FINDINGS Alignment: No static subluxation. Facets are aligned. Occipital condyles are normally positioned. Skull base and vertebrae: No acute fracture. Soft tissues and spinal canal: No prevertebral fluid or swelling. No visible canal hematoma. Disc levels: There is lower cervical disc space narrowing with prominent  osteophyte formation at C5-C6 and C6-C7. Disc osteophyte complexes contribute to moderate to severe neural foraminal stenosis bilaterally at C6-C7. Upper chest: No pneumothorax, pulmonary nodule or pleural effusion. Other: Normal visualized paraspinal cervical soft tissues. IMPRESSION: 1. Chronic microvascular ischemia without acute intracranial abnormality. 2. No acute fracture or static subluxation of the cervical spine. 3. Lower cervical degenerative disc disease with moderate to severe bilateral foraminal stenosis at C6-C7. Electronically Signed   By: Ulyses Jarred M.D.   On: 07/22/2016 01:03   Dg Chest Portable 1 View  Result Date: 07/22/2016 CLINICAL DATA:  Endotracheal tube placement.  Initial encounter. EXAM: PORTABLE CHEST 1 VIEW COMPARISON:  Chest radiograph performed 03/16/2016 FINDINGS: The patient's endotracheal tube is seen ending 5-6 cm above the carina. An enteric tube is noted extending below the diaphragm. The lungs are well-aerated and clear. There is no evidence of focal opacification, pleural effusion or pneumothorax. The cardiomediastinal silhouette is within normal limits. No acute osseous abnormalities are seen. A metallic BB is noted overlying the right midlung zone. IMPRESSION: 1. Endotracheal tube seen ending 5-6 cm above the carina. 2. No acute cardiopulmonary process seen. Electronically Signed   By: Garald Balding M.D.   On: 07/22/2016 00:25     ASSESSMENT / PLAN: 66 yo AAF with new onset seizure and acute encephalopathy of unclear etiology, suspect HHS, hypertensive emergency vs metabolic encephalopathy.  PULMONARY A: Intubated for airway protection P:   - ETT in appropriate location - ABG with permissive hypercapnea - Daily SBT and sedation vacation - PRVC 500/16/0.4/5 - VAP prevention bundle  CARDIOVASCULAR  A:  Hypertensive emergency Sinus Tachycardia Hyperlipidemia  P:  - Continue home metoprolol - Check troponin - Atorvastatin 80mg  Qday -  Nicardipine gtt goal SBP 150-170 - Misplaced left subclavian central line removed - Left femoral CVC placed for IV access - continue home amlodipine - increase dose prior to d/c nicardipine gtt - Holding ARB in the setting of acute illness will restart tomorrow - ASA 325mg  Qday  RENAL A:   CKD - at baseline Scr Lactatemia - 2/2 seizure  P:   - Foley insertion - Check mag and phos - Lactate cleared  GASTROINTESTINAL A:   Not active P:   - PPI for GI ppx  HEMATOLOGIC A:   Not active P:    INFECTIOUS A:   Not active P:   BCx2 07/22/2016 UC none Sputum 07/22/2016 Abx: Holding for now  ENDOCRINE A:   HHS DM II HLD   P:   - Insulin gtt with HHS/DKA protocol - Atorvastatin 80mg  Qday - Goal BG <180  NEUROLOGIC A:   Acute encephalopathy likely metabolic Seizure - new onset likely metabolic  P:   RASS goal: 0 - Benzo positivity on  UDS likely 2/2 EMS dosing - CT head negative - cannot clear C-spine now due to encephalopathy - APAP negative - ASA negative - Keppra 1g BID - Tx of HHS as above - MRI without contrast per neurology - EEG - neurology consulted appreciate recs. - Propofol gtt for sedation  DVT PPX: Lovenox FEN: Tube feeding tomorrow after HHS resolves Code status:  Full  FAMILY  - Updates: Discussed situation and plan with brother on the phone.    Total critical care time: 30 min  Critical care time was exclusive of separately billable procedures and treating other patients.  Critical care was necessary to treat or prevent imminent or life-threatening deterioration.  Critical care was time spent personally by me on the following activities: development of treatment plan with patient and/or surrogate as well as nursing, discussions with consultants, evaluation of patient's response to treatment, examination of patient, obtaining history from patient or surrogate, ordering and performing treatments and interventions, ordering and review of  laboratory studies, ordering and review of radiographic studies, pulse oximetry and re-evaluation of patient's condition.   Meribeth Mattes, DO., MS [facility]    Pulmonary and [place] Pager: [phone]  07/22/2016, 1:19 AM

## 2016-07-22 NOTE — Progress Notes (Signed)
Patient's blood sugars are in the 120's at this time x3 occurrences. MD paged. Insulin gtt D/C'd, moderate sliding scale and lantus ordered. CBG's q4 and tube feeds started. Dietary paged for tube feeding orders. [place]quilla Goodgame, Rande Brunt, RN

## 2016-07-22 NOTE — Progress Notes (Signed)
Bedside EEG completed, results pending. 

## 2016-07-22 NOTE — Progress Notes (Signed)
Subjective: No further seizures.   He had a wake up assessment done just prior to my assessment and was quite agitated, requiring heavier sedation to calm him down afterwards.   Reportedly he was purposeful x 4 and tracked across midline in both directions, but did not follow commands.   Exam: Vitals:   07/22/16 0600 07/22/16 0700  BP: 126/86 136/87  Pulse: 98 95  Resp: 20 18  Temp:     Gen: In bed, intubated Resp: ventilated Abd: soft, nt  (Sedated) Neuro: MS: does not follow commands or open eyes. Does grimace to nox stim.  BX:IDHWY, dolls eye intact, grimace symmetric though assessment hampered by ET tube.  Motor: withdraws x 4.  Sensory:as above  Pertinent Labs: CBG 341  Impression: [name] with focal seizures in the setting of  Elevated BG, hypertension. Though a BG > 600 could be responsible for focal seizures, I typically see this with higher BG levels. With the severe hypertension in the setting of new renal insufficiency, I think that PRES(posterior reversible encephalopathy syndrome) would also be possible. He has been afebrile and leukocytosis could be due to seizure. Will need further evaluation.  Recommendations: 1) MRI brain 2) EEG 3) will follow.   Roland Rack, MD [facility] [phone]  If 7pm- 7am, please page neurology on call as listed in [place].

## 2016-07-22 NOTE — ED Notes (Addendum)
MD currently putting in central line and will draw labs.

## 2016-07-22 NOTE — ED Notes (Signed)
4 points restrain applied on pt per Dr. Randal Buba order for safety, pt becoming very restless, trying to pull IV lines and trying to jump out of bed, pt not following any commands, pt placed on 4 points restrain while he got sedated for intubation. 4 points restrain dc 20 min after, pt sedated for intubation. Pt tolerated all procedures well NAD noticed.

## 2016-07-22 NOTE — Progress Notes (Signed)
[place] Progress Note Patient Name: Richard Bennett DOB: 05/10/1950 MRN: [id number]   Date of Service  07/22/2016  HPI/Events of Note  Vent pt, need for SUP  eICU Interventions  pepcid     Intervention Category Intermediate Interventions: Other:  Rush Landmark 07/22/2016, 3:43 PM

## 2016-07-22 NOTE — ED Notes (Signed)
Fentanyl 100 mcg IV given per Dr. Sharen Heck for central line placement.

## 2016-07-22 NOTE — Consult Note (Signed)
Admission H&P    Chief Complaint: Altered mental status with new onset seizures.  HPI: Richard Bennett is an 66 y.o. male with a history of diabetes mellitus, hypertension and hyperlipidemia brought in by EMS after being found down in the parking lot having seizure activity and apparently unresponsive. He was incontinent of urine in route to the. He was given 5 mg of Versed IV for recurrent seizure activity. He was subsequently loaded with 1 g of Keppra IV. No further seizure activity was reported. CT scan of his head showed no acute intracranial abnormality. There was markedly elevated at 619. BUN was 23 and creatinine was 1.9. He was afebrile and WBC count was normal. Patient has no history of seizures. He was intubated for airway protection and placed on mechanical ventilation. Propofol was started for sedation at 5 mcg/kg/m.  Past Medical History:  Diagnosis Date  . Chest pain   . Colon polyps    adenomatous  . Hyperlipidemia   . Hypertension   . Refusal of blood transfusions as patient is Jehovah's Witness   . Type II diabetes mellitus ([place])     Past Surgical History:  Procedure Laterality Date  . APPENDECTOMY    . BACK SURGERY    . LUMBAR DISC SURGERY     "herniated"    Family History  Problem Relation Age of Onset  . Diabetes Mother   . Cancer Father   . Colon cancer Neg Hx   . Esophageal cancer Neg Hx   . Stomach cancer Neg Hx   . Rectal cancer Neg Hx    Social History:  reports that he quit smoking about 38 years ago. His smoking use included Cigarettes. He has a 10.00 pack-year smoking history. He has never used smokeless tobacco. He reports that he drinks alcohol. He reports that he uses drugs, including Heroin.  Allergies: No Known Allergies  Medications: Preadmission medications were reviewed by me.  ROS: Unobtainable due to patient's unresponsive state.  Physical Examination: Blood pressure (!) 151/104, pulse (!) 110, temperature 98.1 F (36.7 C), resp. rate  20, height 5' 8"  (1.727 m), weight 72.6 kg (160 lb), SpO2 100 %.  HEENT-  Normocephalic, no lesions, without obvious abnormality.  Normal external eye and conjunctiva.  Normal TM's bilaterally.  Normal auditory canals and external ears. Normal external nose, mucus membranes and septum.  Normal pharynx. Neck supple with no masses, nodes, nodules or enlargement. Cardiovascular - S1, S2 normal and no S3 or S4, tachycardic Lungs - chest clear, no wheezing, rales, normal symmetric air entry Abdomen - soft, non-tender; bowel sounds normal; no masses,  no organomegaly Extremities - no joint deformities, effusion, or inflammation and no edema  Neurologic Examination: Patient was intubated and on mechanical ventilation. He was unresponsive to external stimuli, including noxious stimuli. Pupils were equal and reacted normally to light. Extraocular movements were minimal and conjugate with oculocephalic maneuvers. No facial weakness was noted. Muscle tone was flaccid throughout. Patient had no spontaneous movements of extremities and no abnormal posturing. There was no withdrawal movement of extremities to noxious stimuli. Deep tendon reflexes were absent throughout. Plantar responses were mute.  Results for orders placed or performed during the hospital encounter of 07/21/16 (from the past 48 hour(s))  CBG monitoring, ED     Status: Abnormal   Collection Time: 07/21/16 10:56 PM  Result Value Ref Range   Glucose-Capillary >600 (HH) 65 - 99 mg/dL  CBC with Differential (PNL)     Status: Abnormal   Collection  Time: 07/21/16 11:04 PM  Result Value Ref Range   WBC 5.5 4.0 - 10.5 K/uL   RBC 4.43 4.22 - 5.81 MIL/uL   Hemoglobin 11.8 (L) 13.0 - 17.0 g/dL   HCT 35.3 (L) 39.0 - 52.0 %   MCV 79.7 78.0 - 100.0 fL   MCH 26.6 26.0 - 34.0 pg   MCHC 33.4 30.0 - 36.0 g/dL   RDW 12.4 11.5 - 15.5 %   Platelets 227 150 - 400 K/uL   Neutrophils Relative % 57 %   Neutro Abs 3.2 1.7 - 7.7 K/uL   Lymphocytes  Relative 37 %   Lymphs Abs 2.0 0.7 - 4.0 K/uL   Monocytes Relative 4 %   Monocytes Absolute 0.2 0.1 - 1.0 K/uL   Eosinophils Relative 2 %   Eosinophils Absolute 0.1 0.0 - 0.7 K/uL   Basophils Relative 0 %   Basophils Absolute 0.0 0.0 - 0.1 K/uL  Comprehensive metabolic panel     Status: Abnormal   Collection Time: 07/21/16 11:04 PM  Result Value Ref Range   Sodium 130 (L) 135 - 145 mmol/L   Potassium 4.1 3.5 - 5.1 mmol/L   Chloride 95 (L) 101 - 111 mmol/L   CO2 21 (L) 22 - 32 mmol/L   Glucose, Bld 643 (HH) 65 - 99 mg/dL    Comment: CRITICAL RESULT CALLED TO, READ BACK BY AND VERIFIED WITH: NOONAM,K RN 07/21/2016 2347 JORDANS    BUN 22 (H) 6 - 20 mg/dL   Creatinine, Ser 1.97 (H) 0.61 - 1.24 mg/dL   Calcium 9.0 8.9 - 10.3 mg/dL   Total Protein 6.8 6.5 - 8.1 g/dL   Albumin 3.3 (L) 3.5 - 5.0 g/dL   AST 33 15 - 41 U/L   ALT 25 17 - 63 U/L   Alkaline Phosphatase 80 38 - 126 U/L   Total Bilirubin 0.6 0.3 - 1.2 mg/dL   GFR calc non Af Amer 34 (L) >60 mL/min   GFR calc Af Amer 39 (L) >60 mL/min    Comment: (NOTE) The eGFR has been calculated using the CKD EPI equation. This calculation has not been validated in all clinical situations. eGFR's persistently <60 mL/min signify possible Chronic Kidney Disease.    Anion gap 14 5 - 15  Ammonia     Status: Abnormal   Collection Time: 07/21/16 11:04 PM  Result Value Ref Range   Ammonia 40 (H) 9 - 35 umol/L  I-Stat arterial blood gas, ED     Status: Abnormal   Collection Time: 07/21/16 11:09 PM  Result Value Ref Range   pH, Arterial 7.320 (L) 7.350 - 7.450   pCO2 arterial 50.2 (H) 32.0 - 48.0 mmHg   pO2, Arterial 215.0 (H) 83.0 - 108.0 mmHg   Bicarbonate 25.9 20.0 - 28.0 mmol/L   TCO2 27 0 - 100 mmol/L   O2 Saturation 100.0 %   Acid-base deficit 1.0 0.0 - 2.0 mmol/L   Patient temperature 98.1 F    Collection site RADIAL, ALLEN'S TEST ACCEPTABLE    Drawn by Operator    Sample type ARTERIAL   Acetaminophen level     Status:  Abnormal   Collection Time: 07/21/16 11:11 PM  Result Value Ref Range   Acetaminophen (Tylenol), Serum <10 (L) 10 - 30 ug/mL    Comment:        THERAPEUTIC CONCENTRATIONS VARY SIGNIFICANTLY. A RANGE OF 10-30 ug/mL MAY BE AN EFFECTIVE CONCENTRATION FOR MANY PATIENTS. HOWEVER, SOME ARE BEST TREATED AT CONCENTRATIONS OUTSIDE  THIS RANGE. ACETAMINOPHEN CONCENTRATIONS >150 ug/mL AT 4 HOURS AFTER INGESTION AND >50 ug/mL AT 12 HOURS AFTER INGESTION ARE OFTEN ASSOCIATED WITH TOXIC REACTIONS.   Salicylate level     Status: None   Collection Time: 07/21/16 11:11 PM  Result Value Ref Range   Salicylate Lvl <9.1 2.8 - 30.0 mg/dL  I-Stat Chem 8, ED  (not at [facility], [facility])     Status: Abnormal   Collection Time: 07/21/16 11:16 PM  Result Value Ref Range   Sodium 131 (L) 135 - 145 mmol/L   Potassium 4.1 3.5 - 5.1 mmol/L   Chloride 95 (L) 101 - 111 mmol/L   BUN 23 (H) 6 - 20 mg/dL   Creatinine, Ser 1.90 (H) 0.61 - 1.24 mg/dL   Glucose, Bld 619 (HH) 65 - 99 mg/dL   Calcium, Ion 1.12 (L) 1.15 - 1.40 mmol/L   TCO2 22 0 - 100 mmol/L   Hemoglobin 12.6 (L) 13.0 - 17.0 g/dL   HCT 37.0 (L) 39.0 - 52.0 %   Comment NOTIFIED PHYSICIAN   I-Stat CG4 Lactic Acid, ED     Status: Abnormal   Collection Time: 07/21/16 11:16 PM  Result Value Ref Range   Lactic Acid, Venous 5.57 (HH) 0.5 - 1.9 mmol/L   Comment NOTIFIED PHYSICIAN    Dg Chest Portable 1 View  Result Date: 07/22/2016 CLINICAL DATA:  Endotracheal tube placement.  Initial encounter. EXAM: PORTABLE CHEST 1 VIEW COMPARISON:  Chest radiograph performed 03/16/2016 FINDINGS: The patient's endotracheal tube is seen ending 5-6 cm above the carina. An enteric tube is noted extending below the diaphragm. The lungs are well-aerated and clear. There is no evidence of focal opacification, pleural effusion or pneumothorax. The cardiomediastinal silhouette is within normal limits. No acute osseous abnormalities are seen. A metallic BB is noted overlying the right  midlung zone. IMPRESSION: 1. Endotracheal tube seen ending 5-6 cm above the carina. 2. No acute cardiopulmonary process seen. Electronically Signed   By: Garald Balding M.D.   On: 07/22/2016 00:25    Assessment/Plan 66 year old man with history of diabetes mellitus, hypertension and hyperlipidemia presenting with acute metabolic encephalopathic state as well as new onset seizure activity, likely secondary to hyperglycemic state. CT showed no acute intracranial abnormality.  Recommendations: 1. Continue Keppra at 1000 mg every 12 hours 2. MRI of the brain without contrast 3. EEG, routine adult study  We will continue to follow this patient with you.  C.R. Nicole Kindred, MD [facility] [phone]  07/22/2016, 12:59 AM

## 2016-07-22 NOTE — ED Notes (Signed)
3rd dose of Fentanyl 33mcg given per Dr. Sharen Heck for pt sedation.

## 2016-07-22 NOTE — Progress Notes (Signed)
RT assisted in pt transport on vent to MRI.  Pt's vitals remained stable throughout.  Pt placed on MRI vent.

## 2016-07-22 NOTE — ED Notes (Signed)
Delay in obtaining 2nd set of blood cultures,  MD at bedside performing sterile procedure

## 2016-07-22 NOTE — Progress Notes (Signed)
eLink Physician-Brief Progress Note Patient Name: Richard Bennett DOB: December 16, 1950 MRN: [id number]   Date of Service  07/22/2016  HPI/Events of Note  BP soft 27-78 systolic. On propofol drip. UO decreasing as well  eICU Interventions  Bolus 500 mls     Intervention Category Major Interventions: Hypotension - evaluation and management  [place] 07/22/2016, 5:09 PM

## 2016-07-22 NOTE — ED Notes (Signed)
Fentanyl 50 mcg IV given per Dr. Sharen Heck for sedation during central line placement.

## 2016-07-22 NOTE — Procedures (Signed)
CENTRAL VENOUS CATHETER INSERTION   Indication: Venous access Consent: yes Time out: yes Appropriate position: yes Hand washing: yes Patient Sterilized and Draped: yes Location: Left femoral # of Attempts: 1 Ultrasound Guidance: yes Wire Confirmed with [place]: yes Insertion depth: 20 cm All Ports Draw and flush: yes CXR:   Pneumothorax: N/A  Line position appropriate: yes Line sutured in place: yes EBL: 5 cc Complications: no Patient Tolerated Procedure Well: no  ** Left sided subclavian CVC was placed after 3 attempts but was not positioned correctly.  Tip of the catheter crossed midline and terminated in the right IJ.  Due to C-Collar and concerns for stricture vs thrombus in bracheocephalic v.  No further lines were attempted from above.  Successful placement of Left femoral line as noted.  Left subclavian was removed. Subclavian and femoral were selected due to presence of hard C-collar. **     Meribeth Mattes, DO., MS [facility]

## 2016-07-22 NOTE — Procedures (Signed)
ELECTROENCEPHALOGRAM REPORT  Date of Study: 07/22/2016  Patient's Name: Richard Bennett MRN: [id number] Date of Birth: 02/20/1951  Referring Provider: Roswell Nickel, MD  Clinical History: 66 year old man in status epilepticus  Medications: amLODipine (NORVASC) tablet 5 mg  fentaNYL (SUBLIMAZE)  gabapentin (NEURONTIN) capsule 300 mg  levETIRAcetam (KEPPRA) metoprolol tartrate (LOPRESSOR) tablet 12.5 mg  nicardipine (CARDENE) 20mg  propofol (DIPRIVAN) 1000 MG/100ML infusion   Technical Summary: A multichannel digital EEG recording measured by the international 10-20 system with electrodes applied with paste and impedances below 5000 ohms performed in our laboratory with EKG monitoring in an intubated and sedated patient.  Hyperventilation and photic stimulation were not performed.  The digital EEG was referentially recorded, reformatted, and digitally filtered in a variety of bipolar and referential montages for optimal display.    Description: The patient is intubated and unresponsive, with propofol sedation during the recording. There is symmetric and diffuse suppression of background activity. There is no spontaneous reactivity or reactivity noted with noxious stimulation. There is muscle artifact over the frontal leads. Hyperventilation and photic stimulation were not performed. There were no epileptiform discharges or electrographic seizures seen.   EKG lead was unremarkable.  Impression: This EEG is markedly abnormal due to diffuse background suppression and lack of EEG reactivity with noxious stimulation.  Clinical Correlation of the above findings indicates severe diffuse cerebral dysfunction that is likely due to propofol sedation, but can also be seen in the setting of anoxic/ischemic injury or toxic/metabolic encephalopathies.  Clinical correlation is advised.  No electrographic seizures are seen.  Metta Clines, DO

## 2016-07-23 ENCOUNTER — Inpatient Hospital Stay (HOSPITAL_COMMUNITY): Payer: Medicare HMO

## 2016-07-23 LAB — PROTIME-INR
INR: 1.04
PROTHROMBIN TIME: 13.6 s (ref 11.4–15.2)

## 2016-07-23 LAB — MAGNESIUM: MAGNESIUM: 2.2 mg/dL (ref 1.7–2.4)

## 2016-07-23 LAB — CSF CELL COUNT WITH DIFFERENTIAL
RBC COUNT CSF: 26 /mm3 — AB
RBC Count, CSF: 180 /mm3 — ABNORMAL HIGH
TUBE #: 4
Tube #: 1
WBC, CSF: 1 /mm3 (ref 0–5)
WBC, CSF: 2 /mm3 (ref 0–5)

## 2016-07-23 LAB — BASIC METABOLIC PANEL
Anion gap: 10 (ref 5–15)
BUN: 31 mg/dL — ABNORMAL HIGH (ref 6–20)
CALCIUM: 8.4 mg/dL — AB (ref 8.9–10.3)
CO2: 21 mmol/L — ABNORMAL LOW (ref 22–32)
CREATININE: 2.3 mg/dL — AB (ref 0.61–1.24)
Chloride: 106 mmol/L (ref 101–111)
GFR calc non Af Amer: 28 mL/min — ABNORMAL LOW (ref >60)
GFR, EST AFRICAN AMERICAN: 33 mL/min — AB (ref >60)
Glucose, Bld: 215 mg/dL — ABNORMAL HIGH (ref 65–99)
Potassium: 4.4 mmol/L (ref 3.5–5.1)
SODIUM: 137 mmol/L (ref 135–145)

## 2016-07-23 LAB — GLUCOSE, CAPILLARY
GLUCOSE-CAPILLARY: 179 mg/dL — AB (ref 65–99)
GLUCOSE-CAPILLARY: 272 mg/dL — AB (ref 65–99)
GLUCOSE-CAPILLARY: 183 mg/dL — AB (ref 65–99)
GLUCOSE-CAPILLARY: 229 mg/dL — AB (ref 65–99)
GLUCOSE-CAPILLARY: 220 mg/dL — AB (ref 65–99)
Glucose-Capillary: 243 mg/dL — ABNORMAL HIGH (ref 65–99)
Glucose-Capillary: 220 mg/dL — ABNORMAL HIGH (ref 65–99)
Glucose-Capillary: 215 mg/dL — ABNORMAL HIGH (ref 65–99)

## 2016-07-23 LAB — CBC
HCT: 27.5 % — ABNORMAL LOW (ref 39.0–52.0)
Hemoglobin: 9 g/dL — ABNORMAL LOW (ref 13.0–17.0)
MCH: 26.1 pg (ref 26.0–34.0)
MCHC: 32.7 g/dL (ref 30.0–36.0)
MCV: 79.7 fL (ref 78.0–100.0)
PLATELETS: 164 10*3/uL (ref 150–400)
RBC: 3.45 MIL/uL — AB (ref 4.22–5.81)
RDW: 13 % (ref 11.5–15.5)
WBC: 7.9 10*3/uL (ref 4.0–10.5)

## 2016-07-23 LAB — PROTEIN AND GLUCOSE, CSF
GLUCOSE CSF: 130 mg/dL — AB (ref 40–70)
Total  Protein, CSF: 56 mg/dL — ABNORMAL HIGH (ref 15–45)

## 2016-07-23 LAB — PHOSPHORUS: Phosphorus: 3.5 mg/dL (ref 2.5–4.6)

## 2016-07-23 MED ORDER — VANCOMYCIN HCL IN DEXTROSE 750-5 MG/150ML-% IV SOLN
750.0000 mg | INTRAVENOUS | Status: DC
  Filled 2016-07-23: qty 150

## 2016-07-23 MED ORDER — DEXTROSE 5 % IV SOLN
2.0000 g | Freq: Two times a day (BID) | INTRAVENOUS | Status: DC
  Administered 2016-07-23 – 2016-07-24 (×2): 2 g via INTRAVENOUS
  Filled 2016-07-23 (×4): qty 2

## 2016-07-23 MED ORDER — LOSARTAN POTASSIUM 50 MG PO TABS
50.0000 mg | ORAL_TABLET | Freq: Every day | ORAL | Status: DC
  Administered 2016-07-23 – 2016-07-27 (×5): 50 mg via ORAL
  Filled 2016-07-23 (×5): qty 1

## 2016-07-23 MED ORDER — INSULIN GLARGINE 100 UNIT/ML ~~LOC~~ SOLN
15.0000 [IU] | Freq: Every day | SUBCUTANEOUS | Status: DC
  Administered 2016-07-24 – 2016-07-26 (×3): 15 [IU] via SUBCUTANEOUS
  Filled 2016-07-23 (×3): qty 0.15

## 2016-07-23 MED ORDER — SODIUM CHLORIDE 0.9 % IV SOLN
1500.0000 mg | Freq: Once | INTRAVENOUS | Status: AC
  Administered 2016-07-23: 1500 mg via INTRAVENOUS
  Filled 2016-07-23: qty 1500

## 2016-07-23 MED ORDER — SODIUM CHLORIDE 0.9 % IV SOLN
INTRAVENOUS | Status: DC
  Administered 2016-07-23: 12:00:00 via INTRAVENOUS

## 2016-07-23 MED ORDER — LEVETIRACETAM 100 MG/ML PO SOLN
750.0000 mg | Freq: Two times a day (BID) | ORAL | Status: DC
  Administered 2016-07-23 – 2016-07-24 (×2): 750 mg
  Filled 2016-07-23 (×2): qty 10

## 2016-07-23 MED ORDER — LIDOCAINE HCL (PF) 1 % IJ SOLN
INTRAMUSCULAR | Status: AC
  Administered 2016-07-23: 14:00:00
  Filled 2016-07-23: qty 5

## 2016-07-23 MED ORDER — HYDRALAZINE HCL 20 MG/ML IJ SOLN
10.0000 mg | INTRAMUSCULAR | Status: DC | PRN
  Administered 2016-07-23 – 2016-07-24 (×2): 20 mg via INTRAVENOUS
  Administered 2016-07-26: 10 mg via INTRAVENOUS
  Administered 2016-07-27: 20 mg via INTRAVENOUS
  Filled 2016-07-23 (×4): qty 1

## 2016-07-23 MED ORDER — ONDANSETRON HCL 4 MG/2ML IJ SOLN: 4.0000 mg | Freq: Four times a day (QID) | INTRAMUSCULAR | Status: DC | PRN

## 2016-07-23 MED ORDER — FAMOTIDINE 40 MG/5ML PO SUSR
20.0000 mg | Freq: Every day | ORAL | Status: DC
  Administered 2016-07-23: 20 mg via ORAL
  Filled 2016-07-23: qty 2.5

## 2016-07-23 MED ORDER — VITAL AF 1.2 CAL PO LIQD
1000.0000 mL | ORAL | Status: DC
  Administered 2016-07-23: 1000 mL

## 2016-07-23 MED ORDER — SODIUM CHLORIDE 0.9 % IV SOLN
3.0000 g | Freq: Three times a day (TID) | INTRAVENOUS | Status: DC
  Administered 2016-07-23 – 2016-07-24 (×3): 3 g via INTRAVENOUS
  Filled 2016-07-23 (×4): qty 3

## 2016-07-23 NOTE — Progress Notes (Signed)
Subjective: No further seizures. Still not following commands.     Exam: Vitals:   07/23/16 1210 07/23/16 1300  BP: (!) 144/84 115/67  Pulse: 70 66  Resp: 18 18  Temp:     Gen: In bed, intubated Resp: ventilated Abd: soft, nt  Neuro: MS: awake, tracks across midline in both directions.  EK:BTCYE, tracks across midline, b links to eyelid stimulation.  Motor: purposefull x 4 Sensory:as above  Pertinent Labs: CBG 341  Impression: [name] with focal seizures in the setting of  Elevated BG, hypertension. Though a BG > 600 could be responsible for focal seizures, I typically see this with higher BG levels. He has spiked a fever with unclear source. With persistent encephalopathy, sz, and fever he does need LP.  Recommendations: 1) Lumbar puncture for cells, glucose, protein, HSV 2) Decrease keppra to 750mg  BID given GFR  Roland Rack, MD [facility] [phone]  If 7pm- 7am, please page neurology on call as listed in [facility].
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
## 2016-07-23 NOTE — Progress Notes (Signed)
Will ask MD if we  can remove foley when he rounds.

## 2016-07-23 NOTE — Progress Notes (Signed)
Placed back on prior mode due to no respiratory effort.

## 2016-07-23 NOTE — Procedures (Signed)
Lumbar Puncture Procedure Note  Pre-operative Diagnosis: r/o meningitis, encpehalaitis  Post-operative Diagnosis: r/o meningitis, enceph  Indications: Diagnostic  Procedure Details   Consent: Informed consent was obtained. Risks of the procedure were discussed including: infection, bleeding, pain and headache.  The patient was positioned under sterile conditions. Betadine solution and sterile drapes were utilized. A spinal needle was inserted at the L4 - L5 interspace.  Spinal fluid was obtained and sent to the laboratory.  Findings Traumatic that cleared rapidly 63mL of clear spinal fluid was obtained. Opening Pressure: 17 cm H2O pressure. Closing Pressure: wnl cm H2O pressure.  Complications:  None; patient tolerated the procedure well.        Condition: stable  Plan Bed rest for 5 hours. coags plat wnl  Lavon Paganini. Titus Mould, MD, [place] Pgr: [facility]

## 2016-07-23 NOTE — Care Management Note (Signed)
Case Management Note  Patient Details  Name: Richard Bennett MRN: [id number] Date of Birth: Aug 08, 1950  Subjective/Objective:  Pt admitted on 07/21/16 with new onset seizure and acute encephalopathy of unclear etiology, suspect HHS, hypertensive emergency vs metabolic encephalopathy.  PTA, pt resided at home with brother, per report.                   Action/Plan: Pt currently remains intubated, on full support.  Will follow for discharge planning as pt progresses.   Expected Discharge Date:                  Expected Discharge Plan:     In-House Referral:     Discharge planning Services  CM Consult  Post Acute Care Choice:    Choice offered to:     DME Arranged:    DME Agency:     HH Arranged:    HH Agency:     Status of Service:  In process, will continue to follow  If discussed at Long Length of Stay Meetings, dates discussed:    Additional Comments:  Reinaldo Raddle, RN, BSN  Trauma/Neuro ICU Case Manager [phone]

## 2016-07-23 NOTE — Progress Notes (Signed)
Pharmacy Antibiotic Note Richard Bennett is a 66 y.o. male admitted on 07/21/2016 with seizures with prolonged post-ictal/unresponsive episode after resolution of seizures. Planning to do LP today, starting empiric coverage for meningitis in the meantime. WBC wnl, afebrile, AKI, with SCr 1.7 > 2.3.   Plan: Vancomycin 750 IV every 24 hours.  Goal trough 15-20 mcg/mL.  Unasyn 3g/8h Ceftriaxone 2g/12h Monitor renal fx, cultures, LP, VT at steady state given AKI F/u need for HSV coverage    Height: 5\' 9"  (175.3 cm) Weight: 160 lb (72.6 kg) IBW/kg (Calculated) : 70.7  Temp (24hrs), Avg:99.7 F (37.6 C), Min:97.9 F (36.6 C), Max:100.8 F (38.2 C)   Recent Labs Lab 07/21/16 2304 07/21/16 2316 07/22/16 0247 07/22/16 0346 07/22/16 0726 07/22/16 1126 07/22/16 1551 07/23/16 1020  WBC 5.5  --   --  12.0*  --   --   --   --   CREATININE 1.97* 1.90*  --  1.76* 1.74* 1.84* 1.98* 2.30*  LATICACIDVEN  --  5.57* 1.79  --   --   --   --   --      Antimicrobials this admission: 5/22 Unasyn > 5/22 Ceftriaxone > 5/22 Vancomycin >  Dose adjustments this admission: N/A  Microbiology results: 5/21 mrsa pcr: neg 5/21 resp cx: ip 5/21 bood cx: sent   Harvel Quale 07/23/2016 11:42 AM

## 2016-07-24 ENCOUNTER — Inpatient Hospital Stay (HOSPITAL_COMMUNITY): Payer: Medicare HMO

## 2016-07-24 LAB — BASIC METABOLIC PANEL
ANION GAP: 11 (ref 5–15)
ANION GAP: 10 (ref 5–15)
BUN: 34 mg/dL — ABNORMAL HIGH (ref 6–20)
BUN: 25 mg/dL — ABNORMAL HIGH (ref 6–20)
CALCIUM: 8.6 mg/dL — AB (ref 8.9–10.3)
CHLORIDE: 108 mmol/L (ref 101–111)
CO2: 19 mmol/L — AB (ref 22–32)
CO2: 21 mmol/L — AB (ref 22–32)
CREATININE: 2.09 mg/dL — AB (ref 0.61–1.24)
Calcium: 8.4 mg/dL — ABNORMAL LOW (ref 8.9–10.3)
Chloride: 110 mmol/L (ref 101–111)
Creatinine, Ser: 2 mg/dL — ABNORMAL HIGH (ref 0.61–1.24)
GFR calc Af Amer: 37 mL/min — ABNORMAL LOW (ref >60)
GFR calc non Af Amer: 32 mL/min — ABNORMAL LOW (ref >60)
GFR calc non Af Amer: 33 mL/min — ABNORMAL LOW (ref >60)
GFR, EST AFRICAN AMERICAN: 39 mL/min — AB (ref >60)
GLUCOSE: 166 mg/dL — AB (ref 65–99)
GLUCOSE: 120 mg/dL — AB (ref 65–99)
POTASSIUM: 5.4 mmol/L — AB (ref 3.5–5.1)
POTASSIUM: 3.6 mmol/L (ref 3.5–5.1)
Sodium: 138 mmol/L (ref 135–145)
Sodium: 141 mmol/L (ref 135–145)

## 2016-07-24 LAB — GLUCOSE, CAPILLARY
GLUCOSE-CAPILLARY: 168 mg/dL — AB (ref 65–99)
GLUCOSE-CAPILLARY: 132 mg/dL — AB (ref 65–99)
Glucose-Capillary: 167 mg/dL — ABNORMAL HIGH (ref 65–99)
Glucose-Capillary: 133 mg/dL — ABNORMAL HIGH (ref 65–99)
Glucose-Capillary: 114 mg/dL — ABNORMAL HIGH (ref 65–99)
Glucose-Capillary: 112 mg/dL — ABNORMAL HIGH (ref 65–99)

## 2016-07-24 LAB — HIV ANTIBODY (ROUTINE TESTING W REFLEX): HIV Screen 4th Generation wRfx: NONREACTIVE

## 2016-07-24 LAB — HERPES SIMPLEX VIRUS(HSV) DNA BY PCR
HSV 1 DNA: NEGATIVE
HSV 2 DNA: NEGATIVE

## 2016-07-24 MED ORDER — LEVETIRACETAM 100 MG/ML PO SOLN
500.0000 mg | Freq: Two times a day (BID) | ORAL | Status: DC
  Filled 2016-07-24: qty 5

## 2016-07-24 MED ORDER — MIDAZOLAM HCL 2 MG/2ML IJ SOLN
1.0000 mg | INTRAMUSCULAR | Status: DC | PRN
  Filled 2016-07-24: qty 2

## 2016-07-24 MED ORDER — STROKE: EARLY STAGES OF RECOVERY BOOK
Freq: Once | Status: AC
  Administered 2016-07-27: 1
  Filled 2016-07-24: qty 1

## 2016-07-24 MED ORDER — MIDAZOLAM HCL 2 MG/2ML IJ SOLN
INTRAMUSCULAR | Status: AC
  Administered 2016-07-24: 1 mg
  Filled 2016-07-24: qty 2

## 2016-07-24 NOTE — Progress Notes (Signed)
SLP Cancellation Note  Patient Details Name: Laken Lobato MRN: [id number] DOB: [birth year]   Cancelled treatment:       Reason Eval/Treat Not Completed: Patient not medically ready, remains intubated. Will f/u as able.   Germain Osgood 07/24/2016, 10:14 AM  Germain Osgood, M.A. CCC-SLP [phone]

## 2016-07-24 NOTE — Progress Notes (Signed)
Subjective: Some improvement, now following commnads, but gets very agitated     Exam: Vitals:   07/24/16 0930 07/24/16 1000  BP: (!) 147/84 121/76  Pulse: 89 (!) 101  Resp: (!) 0 15  Temp:     Gen: In bed, intubated Resp: ventilated Abd: soft, nt  Neuro: MS: awake, tracks across midline in both directions. Follows command to show 2 fingers, but very agitated.  OO:ILNZV, tracks across midline Motor: purposefull x 4 Sensory:as above  Pertinent Labs: CBG 132  Impression: [name] with focal seizures in the setting of elevated BG, hypertension. Though a BG > 600 could be responsible for focal seizures, I typically see this with higher BG levels. He has spiked a fever with unclear source. LP without evidence of infection, elevated protein in the setting of DM is non-specific. I supect that his subacute infarcts may be incidental. He may have been hypotensive at some point prior to admission. Also possible that there is some component of language difficulty related to this and contributing to his difficulty following commands.   Recommendations: 1) decrease keppra to 500mg  BID. May not need long term.  2) wean per CCM  3) Continue ASA, carotid Dopplers, echo 4) I will continue to follow   Roland Rack, MD [facility] [phone]  If 7pm- 7am, please page neurology on call as listed in [facility].

## 2016-07-24 NOTE — Progress Notes (Signed)
Pt agitated, trying to come out of bed, despite wrist restraints. MD notified, will add posey waist belt for pts safety.

## 2016-07-24 NOTE — Progress Notes (Signed)
Criteria met for reinsertion of foley (needing multiple I&Os in 24 hr). Foley placed in sterile condition, one liter of urine returned.

## 2016-07-24 NOTE — Procedures (Signed)
Extubation Procedure Note  Patient Details:   Name: Richard Bennett DOB: Jan 19, 1951 MRN: [id number]   Airway Documentation:     Evaluation  O2 sats: stable throughout Complications: No apparent complications Patient did tolerate procedure well. Bilateral Breath Sounds: Clear   Yes. Extubated per Dr. Janit Pagan orders.  Vital signs stable  Richard Bennett V 07/24/2016, 11:20 AM

## 2016-07-24 NOTE — Progress Notes (Signed)
PULMONARY / CRITICAL CARE MEDICINE   Name: Richard Bennett MRN: [id number] DOB: Jul 30, 1950    ADMISSION DATE:  07/21/2016  CHIEF COMPLAINT:  Seizure   HISTORY OF PRESENT ILLNESS:   66 yo male Jehovah's Witness (does not accept blood products) with h/o DM, HTN, ETOH and polysubstance (including IVDA) abuse.BIBA found to be having focal seizure in the parking lot of his apartment complex.  EMS gave 5mg  versed in route.  Had long post ictal phase and was [place].  In the ED 1g of Keppra IV, CT head was unremarkable. He was intubated for airway protection and admitted to ICU.   SUBJECTIVE:  LP done Difficult blood draws Urinary retention Followed some simple commands Some loose stools  VITAL SIGNS: Temp:  [97.5 F (36.4 C)-99.2 F (37.3 C)] 99.2 F (37.3 C) (05/23 0800) Pulse Rate:  [57-101] 101 (05/23 1000) Resp:  [0-20] 15 (05/23 1000) BP: (81-171)/(60-106) 121/76 (05/23 1000) SpO2:  [100 %] 100 % (05/23 1000) FiO2 (%):  [40 %] 40 % (05/23 0907) HEMODYNAMICS:   VENTILATOR SETTINGS: Vent Mode: PRVC FiO2 (%):  [40 %] 40 % Set Rate:  [18 bmp] 18 bmp Vt Set:  [550 mL] 550 mL PEEP:  [5 cmH20] 5 cmH20 Plateau Pressure:  [12 PFX90-24 cmH20] 15 cmH20 INTAKE / OUTPUT:  Intake/Output Summary (Last 24 hours) at 07/24/16 1044 Last data filed at 07/24/16 1000  Gross per 24 hour  Intake           2376.9 ml  Output             1450 ml  Net            926.9 ml    PHYSICAL EXAMINATION: General: awakens, vent Neuro: int follow commands, strong ext movement HEENT: ett,. jvd wnl PULM: CTA CV:  s1 s2 RRR no r GI: BS hyper, nt, nd , no r Extremities: edema none, no rash   LABS:  CBC  Recent Labs Lab 07/21/16 2304 07/21/16 2316 07/22/16 0346 07/23/16 1129  WBC 5.5  --  12.0* 7.9  HGB 11.8* 12.6* 12.1* 9.0*  HCT 35.3* 37.0* 35.6* 27.5*  PLT 227  --  180 164   Coag's  Recent Labs Lab 07/23/16 1129  INR 1.04   BMET  Recent Labs Lab 07/22/16 1551  07/23/16 1020 07/24/16 0651  NA 137 137 138  K 3.7 4.4 5.4*  CL 106 106 108  CO2 21* 21* 19*  BUN 24* 31* 34*  CREATININE 1.98* 2.30* 2.09*  GLUCOSE 220* 215* 166*   Electrolytes  Recent Labs Lab 07/22/16 0346  07/22/16 1551 07/23/16 1020 07/24/16 0651  CALCIUM 8.8*  < > 8.3* 8.4* 8.4*  MG 1.5*  --   --  2.2  --   PHOS 2.9  --   --  3.5  --   < > = values in this interval not displayed. Sepsis Markers  Recent Labs Lab 07/21/16 2316 07/22/16 0247  LATICACIDVEN 5.57* 1.79   ABG  Recent Labs Lab 07/21/16 2309 07/22/16 0109  PHART 7.320* 7.322*  PCO2ART 50.2* 53.5*  PO2ART 215.0* 177.0*   Liver Enzymes  Recent Labs Lab 07/21/16 2304  AST 33  ALT 25  ALKPHOS 80  BILITOT 0.6  ALBUMIN 3.3*   Cardiac Enzymes  Recent Labs Lab 07/22/16 0346 07/22/16 0756 07/22/16 1551  TROPONINI 0.06* 0.13* 0.14*   Glucose  Recent Labs Lab 07/23/16 1202 07/23/16 1529 07/23/16 1935 07/23/16 2318 07/24/16 0351 07/24/16 0811  GLUCAP 229* 220*  215* 220* 167* 168*    Imaging Dg Chest Port 1 View  Result Date: 07/24/2016 CLINICAL DATA:  Intubation. EXAM: PORTABLE CHEST 1 VIEW COMPARISON:  07/23/2016 . FINDINGS: Stable metallic density noted over the right chest. Endotracheal tube and NG tube in stable position. Cardiomegaly with normal pulmonary vascularity. No focal infiltrate. IMPRESSION: 1.  Lines and tubes in stable position. 2. Stable mild cardiomegaly. No pulmonary venous congestion. No acute pulmonary disease. Electronically Signed   By: Marcello Moores  Register   On: 07/24/2016 07:29     ASSESSMENT / PLAN: 66 yo AAF with new onset seizure and acute encephalopathy of unclear etiology, suspect HHS, hypertensive emergency vs metabolic encephalopathy. Intubated for airway protection and admitted to ICU. MRI concerning for possible CVA.   PULMONARY A: Intubated for airway protection Acute resp failure P:   Wean aggressive, cpap 5 ps 5 , assess couigh,  mechanics  CARDIOVASCULAR A:  Hypertensive emergency Sinus tachycardia Hyperlipidemia  P:  Continuing home metoprolol, norvasc Losartan follow K in am  Keep SBP < 170, successful PRN hydralazine Continuing home Atorvastatin 80mg  Qday ASA 325mg  Qday  RENAL A:   CKD - at baseline Scr Hypomagnesemia  Lactatemia - 2/2 seizure  (cleared) retention P:   K in pm and am  Allow pos balance crt is better If straight cath x 2 in 24 hours = foley  GASTROINTESTINAL A:   Loose stools P:   PPI for GI ppx No fever, abdo pain, no role cdiff test  Continue TF PRN Zofran  HEMATOLOGIC A:   Not active P:  Follow CBC lovenox can give  INFECTIOUS A:  No meningitis unlikely encpeh  P:   BCx2 07/22/2016 UC none Sputum 07/22/2016 Abx: dc as LP neg If declines, add acyclovir, follow hsv pcr  ENDOCRINE A:   HHS DM II HLD   P:   Lantus may need increase, but if extubated would hold SSI  NEUROLOGIC A:   Acute encephalopathy likely metabolic Seizure - new onset likely metabolic History of polysubstance abuse (ETOH, IVDA. Benzos positive on UDS likely due to EMS dose. Co-ingestants negative) No meningitis, r/o encpeh, unlikely P:   RASS goal: 0  Cannot clear C-spine now due to encephalopathy, will assess pain neck Keppra 1g BID Neurology following LP slight traumatic and neg Propofol gtt for sedation, PRN fentanyl - WUA  DVT PPX: Lovenox  Code status:  Full  FAMILY  - Updates: none  Ccm time 30 min    Lavon Paganini. Titus Mould, MD, [place] Pgr: [facility] 07/24/2016 10:44 AM

## 2016-07-25 ENCOUNTER — Inpatient Hospital Stay (HOSPITAL_COMMUNITY): Payer: Medicare HMO

## 2016-07-25 DIAGNOSIS — I6789 Other cerebrovascular disease: Secondary | ICD-10-CM

## 2016-07-25 DIAGNOSIS — R269 Unspecified abnormalities of gait and mobility: Secondary | ICD-10-CM

## 2016-07-25 DIAGNOSIS — G9340 Encephalopathy, unspecified: Secondary | ICD-10-CM

## 2016-07-25 DIAGNOSIS — I69398 Other sequelae of cerebral infarction: Secondary | ICD-10-CM

## 2016-07-25 LAB — CULTURE, RESPIRATORY W GRAM STAIN: Special Requests: NORMAL

## 2016-07-25 LAB — GLUCOSE, CAPILLARY
GLUCOSE-CAPILLARY: 122 mg/dL — AB (ref 65–99)
GLUCOSE-CAPILLARY: 188 mg/dL — AB (ref 65–99)
GLUCOSE-CAPILLARY: 274 mg/dL — AB (ref 65–99)
Glucose-Capillary: 118 mg/dL — ABNORMAL HIGH (ref 65–99)
Glucose-Capillary: 241 mg/dL — ABNORMAL HIGH (ref 65–99)
Glucose-Capillary: 243 mg/dL — ABNORMAL HIGH (ref 65–99)

## 2016-07-25 LAB — BASIC METABOLIC PANEL
ANION GAP: 14 (ref 5–15)
BUN: 28 mg/dL — ABNORMAL HIGH (ref 6–20)
CHLORIDE: 111 mmol/L (ref 101–111)
CO2: 18 mmol/L — AB (ref 22–32)
Calcium: 8.4 mg/dL — ABNORMAL LOW (ref 8.9–10.3)
Creatinine, Ser: 2.18 mg/dL — ABNORMAL HIGH (ref 0.61–1.24)
GFR calc non Af Amer: 30 mL/min — ABNORMAL LOW (ref >60)
GFR, EST AFRICAN AMERICAN: 35 mL/min — AB (ref >60)
Glucose, Bld: 126 mg/dL — ABNORMAL HIGH (ref 65–99)
POTASSIUM: 3.7 mmol/L (ref 3.5–5.1)
Sodium: 143 mmol/L (ref 135–145)

## 2016-07-25 LAB — CULTURE, RESPIRATORY

## 2016-07-25 LAB — LIPID PANEL
CHOLESTEROL: 173 mg/dL (ref 0–200)
HDL: 33 mg/dL — ABNORMAL LOW (ref >40)
LDL Cholesterol: 98 mg/dL (ref 0–99)
Total CHOL/HDL Ratio: 5.2 RATIO
Triglycerides: 208 mg/dL — ABNORMAL HIGH (ref <150)
VLDL: 42 mg/dL — AB (ref 0–40)

## 2016-07-25 LAB — ECHOCARDIOGRAM COMPLETE
Height: 69 in
Weight: 2560 oz

## 2016-07-25 MED ORDER — DOCUSATE SODIUM 100 MG PO CAPS
100.0000 mg | ORAL_CAPSULE | Freq: Two times a day (BID) | ORAL | Status: DC
  Administered 2016-07-25 (×2): 100 mg via ORAL
  Filled 2016-07-25 (×3): qty 1

## 2016-07-25 MED ORDER — RESOURCE THICKENUP CLEAR PO POWD
ORAL | Status: DC | PRN
  Filled 2016-07-25: qty 125

## 2016-07-25 MED ORDER — LEVETIRACETAM 500 MG PO TABS
500.0000 mg | ORAL_TABLET | Freq: Two times a day (BID) | ORAL | Status: DC
  Administered 2016-07-25: 500 mg via ORAL
  Filled 2016-07-25: qty 1

## 2016-07-25 MED ORDER — ACETAMINOPHEN 325 MG PO TABS: 650.0000 mg | ORAL_TABLET | Freq: Four times a day (QID) | ORAL | Status: DC | PRN

## 2016-07-25 MED ORDER — FAMOTIDINE 20 MG PO TABS
20.0000 mg | ORAL_TABLET | Freq: Every day | ORAL | Status: DC
  Administered 2016-07-25 – 2016-07-27 (×3): 20 mg via ORAL
  Filled 2016-07-25 (×3): qty 1

## 2016-07-25 MED ORDER — ENSURE ENLIVE PO LIQD
237.0000 mL | Freq: Two times a day (BID) | ORAL | Status: DC
  Administered 2016-07-25 – 2016-07-27 (×2): 237 mL via ORAL

## 2016-07-25 NOTE — Progress Notes (Signed)
Inpatient Rehabilitation  Per PT request, patient was screened by Junior Huezo for appropriateness for an Inpatient Acute Rehab consult.  At this time we are recommending an Inpatient Rehab consult.  Please order if you are agreeable.    Karrigan Messamore, M.A., CCC/SLP Admission Coordinator  [facility]  Cell [phone]  

## 2016-07-25 NOTE — Consult Note (Signed)
Physical Medicine and Rehabilitation Consult Reason for Consult: Encephalopathy Referring Physician: Dr. Titus Mould   HPI: Richard Bennett is a 66 y.o. right handed Jehovah's Witness male with history of hypertension, diabetes mellitus. Per chart review patient lives alone independently prior to admission. He is retired. One level apartment. He has a brother in the area that checks on him. Presented 07/22/2016 after witnessed focal seizure in the parking lot of his apartment complex. EMS gave 5 mg of Versed in route. Postictal phase he was unresponsive. Blood sugar 517. MRI showed pattern consistent with late acute to subacute ischemia affecting the posterior frontal cortex and subcortical white matter, watershed pattern of infarction favored. No structural abnormality seen. No features suggestive of PRES. Extensive atrophy and small vessel disease. EEG showed severe diffuse cerebral dysfunction suggesting anoxic/ischemic or toxic metabolic encephalopathy. No seizure activity. LP without evidence of infection. Patient did remain intubated 07/22/2016 until 07/24/2016 Echocardiogram and carotid Dopplers are pending. Maintained on Keppra for seizure prophylaxis. Subcutaneous Lovenox for DVT prophylaxis. Dysphagia #2 nectar thick liquid diet. Neurology consulted presently on aspirin therapy suspect subacute infarct to be incidental. Physical therapy evaluation completed 07/25/2016 with recommendations of physical medicine rehabilitation consult.   Review of Systems  Unable to perform ROS: Acuity of condition   Past Medical History:  Diagnosis Date  . Chest pain   . Colon polyps    adenomatous  . Hyperlipidemia   . Hypertension   . Refusal of blood transfusions as patient is Jehovah's Witness   . Type II diabetes mellitus ([place])    Past Surgical History:  Procedure Laterality Date  . APPENDECTOMY    . BACK SURGERY    . LUMBAR DISC SURGERY     "herniated"   Family History  Problem  Relation Age of Onset  . Diabetes Mother   . Cancer Father   . Colon cancer Neg Hx   . Esophageal cancer Neg Hx   . Stomach cancer Neg Hx   . Rectal cancer Neg Hx    Social History:  reports that he quit smoking about 38 years ago. His smoking use included Cigarettes. He has a 10.00 pack-year smoking history. He has never used smokeless tobacco. He reports that he drinks alcohol. He reports that he uses drugs, including Heroin. Allergies: No Known Allergies Facility-Administered Medications Prior to Admission  Medication Dose Route Frequency Provider Last Rate Last Dose  . 0.9 %  sodium chloride infusion  500 mL Intravenous Continuous Irene Shipper, MD       Medications Prior to Admission  Medication Sig Dispense Refill  . Eluxadoline (VIBERZI) 100 MG TABS Take 100 mg by mouth 2 (two) times daily.    . hydrochlorothiazide (MICROZIDE) 12.5 MG capsule Take 12.5 mg by mouth daily.    Marland Kitchen LANTUS SOLOSTAR 100 UNIT/ML Solostar Pen Inject 15 Units into the skin every morning. 15 mL 3  . losartan (COZAAR) 50 MG tablet Take 1 tablet (50 mg total) by mouth daily. (Patient taking differently: Take 100 mg by mouth daily. ) 30 tablet 0  . oxyCODONE-acetaminophen (PERCOCET/ROXICET) 5-325 MG tablet Take 1 tablet by mouth daily as needed for severe pain.    Marland Kitchen amLODipine (NORVASC) 5 MG tablet Take 1 tablet (5 mg total) by mouth daily. (Patient not taking: Reported on 07/22/2016) 30 tablet 0  . gabapentin (NEURONTIN) 300 MG capsule Take 1 capsule (300 mg total) by mouth 3 (three) times daily. (Patient not taking: Reported on 03/16/2016) 90 capsule 0  .  metoprolol tartrate (LOPRESSOR) 25 MG tablet Take 0.5 tablets (12.5 mg total) by mouth 2 (two) times daily. (Patient not taking: Reported on 03/16/2016) 60 tablet 0  . traMADol (ULTRAM) 50 MG tablet Take 1 tablet (50 mg total) by mouth every 8 (eight) hours as needed. (Patient not taking: Reported on 07/22/2016) 30 tablet 0    Home: Home Living Family/patient  expects to be discharged to:: Private residence Living Arrangements: Alone Available Help at Discharge: Family, Available PRN/intermittently Type of Home: Apartment Home Access: Elevator Home Layout: One level Bathroom Shower/Tub: Chiropodist: Standard Home Equipment: None  Functional History: Prior Function Level of Independence: Independent Comments: Report she is retired; not working. Drives. Has a brother near by. Functional Status:  Mobility: Bed Mobility Overal bed mobility: Needs Assistance Bed Mobility: Rolling, Sidelying to Sit Rolling: Min guard Sidelying to sit: Min assist, HOB elevated General bed mobility comments: Pt rolling on side and trying to elevate trunk without using UEs with much difficulty losing balance back on bed; requires Min A to elevate trunk. Some instability sitting EOB. Transfers Overall transfer level: Needs assistance Equipment used: None Transfers: Sit to/from Stand Sit to Stand: Min assist General transfer comment: Min A to power to standing with unsteadiness. Transferred to chair with Min A of 2 for safety. Ambulation/Gait Ambulation/Gait assistance: Min assist, +2 physical assistance Ambulation Distance (Feet): 6 Feet Assistive device: 1 person hand held assist Gait Pattern/deviations: Step-to pattern, Step-through pattern, Staggering left, Staggering right, Narrow base of support, Decreased stance time - right General Gait Details: Very slow, staggering like gait with LOB to right/left x3 for this short distance; Instability of right knee. Decreased awareness of deficits. Gait velocity interpretation: Below normal speed for age/gender    ADL:    Cognition: Cognition Overall Cognitive Status: Impaired/Different from baseline Orientation Level: Oriented to person, Oriented to place, Oriented to situation, Disoriented to time Cognition Arousal/Alertness: Awake/alert Behavior During Therapy: Flat affect, Impulsive  (impulsive at times.) Overall Cognitive Status: Impaired/Different from baseline Area of Impairment: Orientation, Following commands, Awareness, Safety/judgement, Problem solving Orientation Level: Disoriented to, Time (unable to state correct year; "1998" Able to state yes to president Trump but with cues.) Following Commands: Follows multi-step commands inconsistently (Repetition of multistep commands needed.) Safety/Judgement: Decreased awareness of safety, Decreased awareness of deficits Awareness: Intellectual Problem Solving: Slow processing, Requires verbal cues General Comments: Reports having no deficits and feeling "normal" despite having LOB x2 to get to chair; poor awareness. Delayed processing to respond.  Blood pressure 134/61, pulse (!) 109, temperature 98.8 F (37.1 C), temperature source Oral, resp. rate 16, height 5\' 9"  (1.753 m), weight 72.6 kg (160 lb), SpO2 96 %. Physical Exam  Constitutional: He appears well-developed.  HENT:  Poor dentition  Eyes:  Pupils reactive to light  Neck: Normal range of motion. Neck supple. No thyromegaly present.  Cardiovascular: Normal rate, regular rhythm and normal heart sounds.   Respiratory: Effort normal and breath sounds normal. No respiratory distress.  GI: Soft. Bowel sounds are normal. He exhibits no distension.  Neurological: He is alert.  Provides his name. Needed some cues to provide name of hospital. He can state he was born in November but could not provide the appropriate year. Limited medical historian. Follows simple commands  Skin: Skin is warm and dry.  Sensation difficult to assess. He confuses right and left. Motor strength is 4/5, bilateral deltoid, bicep, tricep, grip, hip flexion, knee extension, ankle dorsiflexion. Mild dysmetria bilateral finger-nose-finger  Results for orders  placed or performed during the hospital encounter of 07/21/16 (from the past 24 hour(s))  Glucose, capillary     Status: Abnormal    Collection Time: 07/24/16 12:08 PM  Result Value Ref Range   Glucose-Capillary 132 (H) 65 - 99 mg/dL   Comment 1 Notify RN    Comment 2 Document in Chart   Glucose, capillary     Status: Abnormal   Collection Time: 07/24/16  4:00 PM  Result Value Ref Range   Glucose-Capillary 133 (H) 65 - 99 mg/dL   Comment 1 Notify RN    Comment 2 Document in Chart   Glucose, capillary     Status: Abnormal   Collection Time: 07/24/16  7:25 PM  Result Value Ref Range   Glucose-Capillary 114 (H) 65 - 99 mg/dL  Basic metabolic panel     Status: Abnormal   Collection Time: 07/24/16  8:32 PM  Result Value Ref Range   Sodium 141 135 - 145 mmol/L   Potassium 3.6 3.5 - 5.1 mmol/L   Chloride 110 101 - 111 mmol/L   CO2 21 (L) 22 - 32 mmol/L   Glucose, Bld 120 (H) 65 - 99 mg/dL   BUN 25 (H) 6 - 20 mg/dL   Creatinine, Ser 2.00 (H) 0.61 - 1.24 mg/dL   Calcium 8.6 (L) 8.9 - 10.3 mg/dL   GFR calc non Af Amer 33 (L) >60 mL/min   GFR calc Af Amer 39 (L) >60 mL/min   Anion gap 10 5 - 15  Glucose, capillary     Status: Abnormal   Collection Time: 07/24/16 11:01 PM  Result Value Ref Range   Glucose-Capillary 112 (H) 65 - 99 mg/dL  Glucose, capillary     Status: Abnormal   Collection Time: 07/25/16  3:14 AM  Result Value Ref Range   Glucose-Capillary 118 (H) 65 - 99 mg/dL  Lipid panel     Status: Abnormal   Collection Time: 07/25/16  4:38 AM  Result Value Ref Range   Cholesterol 173 0 - 200 mg/dL   Triglycerides 208 (H) <150 mg/dL   HDL 33 (L) >40 mg/dL   Total CHOL/HDL Ratio 5.2 RATIO   VLDL 42 (H) 0 - 40 mg/dL   LDL Cholesterol 98 0 - 99 mg/dL  Basic metabolic panel     Status: Abnormal   Collection Time: 07/25/16  4:38 AM  Result Value Ref Range   Sodium 143 135 - 145 mmol/L   Potassium 3.7 3.5 - 5.1 mmol/L   Chloride 111 101 - 111 mmol/L   CO2 18 (L) 22 - 32 mmol/L   Glucose, Bld 126 (H) 65 - 99 mg/dL   BUN 28 (H) 6 - 20 mg/dL   Creatinine, Ser 2.18 (H) 0.61 - 1.24 mg/dL   Calcium 8.4 (L) 8.9  - 10.3 mg/dL   GFR calc non Af Amer 30 (L) >60 mL/min   GFR calc Af Amer 35 (L) >60 mL/min   Anion gap 14 5 - 15  Glucose, capillary     Status: Abnormal   Collection Time: 07/25/16  7:49 AM  Result Value Ref Range   Glucose-Capillary 122 (H) 65 - 99 mg/dL   Comment 1 Notify RN    Comment 2 Document in Chart    Dg Chest Port 1 View  Result Date: 07/24/2016 CLINICAL DATA:  Intubation. EXAM: PORTABLE CHEST 1 VIEW COMPARISON:  07/23/2016 . FINDINGS: Stable metallic density noted over the right chest. Endotracheal tube and NG tube in stable  position. Cardiomegaly with normal pulmonary vascularity. No focal infiltrate. IMPRESSION: 1.  Lines and tubes in stable position. 2. Stable mild cardiomegaly. No pulmonary venous congestion. No acute pulmonary disease. 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 1. Does the need for close, 24 hr/day medical supervision in concert with the patient's rehab needs make it unreasonable for this patient to be served in a less intensive setting? Yes 2. Co-Morbidities requiring supervision/potential complications: Diabetes, hypertension, seizure disorder, dysphagia 3. Due to bladder management, bowel management, safety, skin/wound care, disease management, medication administration, pain management and patient education, does the patient require 24 hr/day rehab nursing? Yes 4. Does the patient require coordinated care of a physician, rehab nurse, PT (1-2 hrs/day, 5 days/week), OT (1-2 hrs/day, 5 days/week) and SLP (.5-1 hrs/day, 5 days/week) to address physical and functional deficits in the context of the above medical diagnosis(es)? Yes Addressing deficits in the following areas: balance, endurance, locomotion, strength, transferring, bowel/bladder control, bathing, dressing, feeding, grooming, toileting, cognition and  swallowing 5. Can the patient actively participate in an intensive therapy program of at least 3 hrs of therapy per day at least 5 days per week? Yes 6. The potential for patient to make measurable gains while on inpatient rehab is good 7. Anticipated functional outcomes upon discharge from inpatient rehab are modified independent and supervision  with PT, modified independent and supervision with OT, modified independent and supervision with SLP. 8. Estimated rehab length of stay to reach the above functional goals is: 10-14d 9. Anticipated D/C setting: Home 10. Anticipated post D/C treatments: [place] therapy 11. Overall Rehab/Functional Prognosis: good  RECOMMENDATIONS: This patient's condition is appropriate for continued rehabilitative care in the following setting: CIR Patient has agreed to participate in recommended program. Yes Note that insurance prior authorization may be required for reimbursement for recommended care.  Comment:   Charlett Blake M.D. [facility] FAAPM&R (Sports Med, Neuromuscular Med) Diplomate Am Board of Electrodiagnostic Med  Cathlyn Parsons., PA-C 07/25/2016

## 2016-07-25 NOTE — Progress Notes (Signed)
Subjective: Extubated, greatly improved. Does not remember day of admission.     Exam: Vitals:   07/25/16 1000 07/25/16 1155  BP:    Pulse: (!) 109   Resp: 16   Temp:  98.2 F (36.8 C)   Gen: In bed, intubated Resp: ventilated Abd: soft, nt  Neuro: MS: awake, alert, no clear aphasia. Oriented x 3.  CV:[facility], VFF Motor: 5/5 throughout, though ? Mild drift in right arm.  Sensory: intact to LT  Impression: [name] with focal seizures in the setting of elevated BG, hypertension. Though a BG > 600 could be responsible for focal seizures, I typically see this with higher BG levels. He has spiked a fever with unclear source. LP without evidence of infection, elevated protein in the setting of DM is non-specific. I supect that his subacute infarcts are incidental. He may have been hypotensive at some point prior to admission as it appears in a watershed region. He will need echo/carotid dopplers for further evaluation.   Recommendations: 1) will d/c keppra now that he is extubated.  2) Continue ASA, carotid Dopplers, echo 3) PT,ST, OT 4)  I will continue to follow   Roland Rack, MD [facility] [phone]  If 7pm- 7am, please page neurology on call as listed in [place].

## 2016-07-25 NOTE — Care Management Note (Signed)
Case Management Note  Patient Details  Name: Richard Bennett MRN: [id number] Date of Birth: Sep 10, 1950  Subjective/Objective:  Pt admitted on 07/21/16 with new onset seizure and acute encephalopathy of unclear etiology, suspect HHS, hypertensive emergency vs metabolic encephalopathy.  PTA, pt resided at home with brother, per report.                   Action/Plan: Pt currently remains intubated, on full support.  Will follow for discharge planning as pt progresses.   Expected Discharge Date:                  Expected Discharge Plan:  [name]  In-House Referral:     Discharge planning Services  CM Consult  Post Acute Care Choice:    Choice offered to:     DME Arranged:    DME Agency:     HH Arranged:    [place] Agency:     Status of Service:  In process, will continue to follow  If discussed at Long Length of Stay Meetings, dates discussed:    Additional Comments: 07/25/16 J. Gurdeep Keesey, RN, BSN PT/OT recommending CIR and consult pending.  Will follow.    Reinaldo Raddle, RN, BSN  Trauma/Neuro ICU Case Manager [phone]

## 2016-07-25 NOTE — Evaluation (Signed)
Speech Language Pathology Evaluation Patient Details Name: Richard Bennett MRN: [id number] DOB: [birth year] Today's Date: 07/25/2016 Time: 1025-1040 SLP Time Calculation (min) (ACUTE ONLY): 15 min  Problem List:  Patient Active Problem List   Diagnosis Date Noted  . Encephalopathy acute 07/22/2016  . Seizure ([place])   . Type 1 diabetes mellitus with ketoacidotic coma ([place])   . Respiratory arrest ([place])   . Abdominal pain 01/05/2016  . Chest pain 10/31/2015  . Hypertensive emergency 10/30/2015  . Hyponatremia 10/30/2015  . AKI (acute kidney injury) ([place]) 10/30/2015  . Pain in the chest   . Essential hypertension   . Hyperlipidemia   . Diabetes mellitus without complication [facility])    Past Medical History:  Past Medical History:  Diagnosis Date  . Chest pain   . Colon polyps    adenomatous  . Hyperlipidemia   . Hypertension   . Refusal of blood transfusions as patient is Jehovah's Witness   . Type II diabetes mellitus ([place])    Past Surgical History:  Past Surgical History:  Procedure Laterality Date  . APPENDECTOMY    . BACK SURGERY    . LUMBAR DISC SURGERY     "herniated"   HPI:  Patient is a 66 y/o male with new onset seizure and acute encephalopathy of unclear etiology, hypertensive emergency vs metabolic encephalopathy. Intubated 5/21-5/23. 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   Assessment / Plan / Recommendation Clinical Impression  Pt was living independently PTA, but now presents with significantly impaired sustained attention and storage of new information. He recalled only 50% of words during delayed recall task even with multiple choice options provided. He has no recall of getting up OOB to the chair with PT. His intellectual awareness of his phsyical and cognitive impairments is limited. Pt would benefit from intensive SLP f/u to  maximize functional independence.    SLP Assessment  SLP Recommendation/Assessment: Patient needs continued Speech Lanaguage Pathology Services SLP Visit Diagnosis: Cognitive communication deficit (R41.841)    Follow Up Recommendations  Inpatient Rehab    Frequency and Duration min 2x/week  2 weeks      SLP Evaluation Cognition  Overall Cognitive Status: Impaired/Different from baseline Arousal/Alertness: Awake/alert Orientation Level: Oriented to person;Oriented to place;Oriented to situation;Disoriented to time Memory: Impaired Memory Impairment: Storage deficit;Retrieval deficit;Decreased recall of new information Awareness: Impaired Awareness Impairment: Intellectual impairment;Emergent impairment;Anticipatory impairment Problem Solving: Impaired Problem Solving Impairment: Verbal basic Behaviors: Impulsive Safety/Judgment: Impaired       Comprehension  Auditory Comprehension Overall Auditory Comprehension: Appears within functional limits for tasks assessed (with basic, functional tasks)    Expression Expression Primary Mode of Expression: Verbal Verbal Expression Overall Verbal Expression: Impaired Initiation: No impairment Naming: Impairment Confrontation: Impaired Written Expression Dominant Hand: Right   Oral / Motor  Oral Motor/Sensory Function Overall Oral Motor/Sensory Function: Within functional limits Motor Speech Overall Motor Speech:  (? mildly slurred but intelligible)   GO                    Germain Osgood 07/25/2016, 12:21 PM  Germain Osgood, M.A. CCC-SLP [phone]

## 2016-07-25 NOTE — Progress Notes (Signed)
  Echocardiogram 2D Echocardiogram has been performed.  Darlina Sicilian M 07/25/2016, 11:43 AM

## 2016-07-25 NOTE — Evaluation (Signed)
Clinical/Bedside Swallow Evaluation Patient Details  Name: Richard Bennett MRN: [id number] Date of Birth: [birth year]  Today's Date: 07/25/2016 Time: SLP Start Time (ACUTE ONLY): 1009 SLP Stop Time (ACUTE ONLY): 1025 SLP Time Calculation (min) (ACUTE ONLY): 16 min  Past Medical History:  Past Medical History:  Diagnosis Date  . Chest pain   . Colon polyps    adenomatous  . Hyperlipidemia   . Hypertension   . Refusal of blood transfusions as patient is Jehovah's Witness   . Type II diabetes mellitus ([place])    Past Surgical History:  Past Surgical History:  Procedure Laterality Date  . APPENDECTOMY    . BACK SURGERY    . LUMBAR DISC SURGERY     "herniated"   HPI:  Patient is a 66 y/o male with new onset seizure and acute encephalopathy of unclear etiology, hypertensive emergency vs metabolic encephalopathy. Intubated 5/21-5/23. 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   Assessment / Plan / Recommendation Clinical Impression  Pt has immediate coughing that follows cup sips of thin liquids despite SLP cueing for smaller, more controlled bolus size. Delayed coughing was observed after bolus of soft solids washed down with straw sips of nectar thick liquids, but with cup sips and softer food options he had no overt signs of aspiration, although SLP was providing Mod cues for pacing. Recommend Dys 2 diet and nectar thick liquids by cup with full supervision. Will f/u for tolerance and readiness to advance versus need for instrumental testing. SLP Visit Diagnosis: Dysphagia, unspecified (R13.10)    Aspiration Risk  Mild aspiration risk;Moderate aspiration risk    Diet Recommendation Dysphagia 2 (Fine chop);Nectar-thick liquid   Liquid Administration via: Cup;No straw Medication Administration: Whole meds with puree Supervision: Patient able to self feed;Full  supervision/cueing for compensatory strategies Compensations: Slow rate;Small sips/bites;Minimize environmental distractions Postural Changes: Seated upright at 90 degrees;Remain upright for at least 30 minutes after po intake    Other  Recommendations Oral Care Recommendations: Oral care BID   Follow up Recommendations Inpatient Rehab      Frequency and Duration min 2x/week  2 weeks       Prognosis Prognosis for Safe Diet Advancement: Good Barriers to Reach Goals: Cognitive deficits      Swallow Study   General HPI: Patient is a 66 y/o male with new onset seizure and acute encephalopathy of unclear etiology, hypertensive emergency vs metabolic encephalopathy. Intubated 5/21-5/23. 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 Type of Study: Bedside Swallow Evaluation Previous Swallow Assessment: none in chart Diet Prior to this Study: NPO Temperature Spikes Noted: No Respiratory Status: Room air History of Recent Intubation: Yes Length of Intubations (days): 2 days Date extubated: 07/24/16 Behavior/Cognition: Alert;Cooperative;Pleasant mood;Confused Oral Cavity Assessment: Within Functional Limits Oral Cavity - Dentition: Missing dentition Vision: Functional for self-feeding Self-Feeding Abilities: Able to feed self Patient Positioning: Upright in chair Baseline Vocal Quality: Normal Volitional Cough: Strong Volitional Swallow: Able to elicit    Oral/Motor/Sensory Function Overall Oral Motor/Sensory Function: Within functional limits   Ice Chips Ice chips: Within functional limits Presentation: Spoon   Thin Liquid Thin Liquid: Impaired Presentation: Cup;Self Fed Pharyngeal  Phase Impairments: Cough - Immediate    Nectar Thick Nectar Thick Liquid: Impaired Presentation: Cup;Self Fed;Straw Pharyngeal Phase Impairments: Cough - Delayed   Honey Thick  Honey Thick Liquid:  Not tested   Puree Puree: Within functional limits Presentation: Self Fed;Spoon   Solid   GO   Solid: Impaired Presentation: Self Fed Pharyngeal Phase Impairments: Cough - Delayed        Germain Osgood 07/25/2016,12:04 PM  Germain Osgood, M.A. CCC-SLP [phone]

## 2016-07-25 NOTE — Progress Notes (Signed)
Occupational Therapy Evaluation Patient Details Name: Richard Bennett MRN: [id number] DOB: May 19, 1950 Today's Date: 07/25/2016    History of Present Illness Patient is a 66 y/o male with new onset seizure and acute encephalopathy of unclear etiology, hypertensive emergency vs metabolic encephalopathy. Intubated 5/21-5/23. 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   Clinical Impression   PTA, pt reports living alone and being independent with ADL and mobility. 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 Pt presents with significant deficits as listed below and currently requires min A with limited mobility and mod A with ADL. Feel pt will benefit from rehab at [facility], but will most likely need initial 24/7S after CIR. Will follow acutely to address established goals and maximize functional level of independence.     Follow Up Recommendations  CIR;Supervision/Assistance - 24 hour    Equipment Recommendations  3 in 1 bedside commode    Recommendations for Other Services Rehab consult     Precautions / Restrictions Precautions Precautions: Fall Restrictions Weight Bearing Restrictions: No      Mobility Bed Mobility Overal bed mobility: Needs Assistance Bed Mobility: Supine to Sit;Sit to Supine   Sidelying to sit: Min assist;HOB elevated   Sit to supine: Min assist      Transfers Overall transfer level: Needs assistance Equipment used: None Transfers: Sit to/from Stand Sit to Stand: Min assist         General transfer comment: LOB R    Balance Overall balance assessment: Needs assistance Sitting-balance support: Feet supported;No upper extremity supported Sitting balance-Leahy Scale: Fair Sitting balance - Comments: Some sway and truncal instability noted sitting EOB. Total A to donn socks.    Standing balance support: During  functional activity;Single extremity supported Standing balance-Leahy Scale: Poor Standing balance comment: Requires UE support in standing. Unsteady.                           ADL either performed or assessed with clinical judgement   ADL Overall ADL's : Needs assistance/impaired Eating/Feeding: Minimal assistance   Grooming: Minimal assistance;Sitting   Upper Body Bathing: Minimal assistance;Sitting   Lower Body Bathing: Moderate assistance;Sit to/from stand   Upper Body Dressing : Moderate assistance;Sitting   Lower Body Dressing: Moderate assistance;Sit to/from stand   Toilet Transfer: Minimal assistance;Stand-pivot   Toileting- Clothing Manipulation and Hygiene: Moderate assistance       Functional mobility during ADLs: Minimal assistance (stand pivot)       Vision   Additional Comments: will further assess. "its kind of blurry". unsure if pt wears glasses     Perception Perception Comments: will fruther assess   Praxis Praxis Praxis tested?: Within functional limits    Pertinent Vitals/Pain Pain Assessment: No/denies pain     Hand Dominance Right   Extremity/Trunk Assessment Upper Extremity Assessment Upper Extremity Assessment: RUE deficits/detail RUE Deficits / Details: generalized weakness. note drift. Using funcitonally. ?clumsy R hand. RUE Sensation:  (will further assess) RUE Coordination: decreased fine motor   Lower Extremity Assessment RLE Deficits / Details: Pt sitting EOB and unaware RLE behind bedrail when trying to stand up RLE Sensation:  (Reports WFL.) LLE Sensation:  (Reports WFL.)   Cervical / Trunk Assessment Cervical / Trunk Assessment: Kyphotic   Communication Communication Communication: Expressive difficulties (word finding difficulties)   Cognition Arousal/Alertness: Awake/alert Behavior During Therapy: Flat affect;Impulsive (impulsive at times.) Overall Cognitive Status: Impaired/Different  from baseline Area  of Impairment: Orientation;Following commands;Awareness;Safety/judgement;Problem solving;Attention;Memory                 Orientation Level: Disoriented to;Time (unable to state correct year or month - previously cued by other therapistsCurrent Attention Level: Selective Memory: Decreased short-term memory Following Commands: Follows one step commands with increased time (Repetition of multistep commands needed.) Safety/Judgement: Decreased awareness of safety;Decreased awareness of deficits Awareness: emergent Problem Solving: Slow processing;Requires verbal cues General Comments: when asked pt waht he has difficulty with since his "seizure", pt reports "paying bills" / Pt did recall MD seeing him prior to my visit but was unable to give details regarding conversation   General Comments       Exercises     Shoulder Instructions      Home Living Family/patient expects to be discharged to:: Private residence Living Arrangements: Alone Available Help at Discharge: Family;Available PRN/intermittently Type of Home: Apartment Home Access: Elevator     Home Layout: One level     Bathroom Shower/Tub: Teacher, early years/pre: Standard Bathroom Accessibility:  (unsure/pt states it is)   Home Equipment: None      Lives With: Alone    Prior Functioning/Environment Level of Independence: Independent        Comments: 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 Drives. Has a brother near by.        OT Problem List: Decreased strength;Decreased range of motion;Decreased activity tolerance;Impaired balance (sitting and/or standing);Impaired vision/perception;Decreased coordination;Decreased cognition;Decreased safety awareness      OT Treatment/Interventions: Self-care/ADL training;Therapeutic exercise;Neuromuscular education;DME and/or AE instruction;Therapeutic activities;Cognitive remediation/compensation;Visual/perceptual  remediation/compensation;Patient/family education;Balance training    OT Goals(Current goals can be found in the care plan section) Acute Rehab OT Goals Patient Stated Goal: none stated OT Goal Formulation: Patient unable to participate in goal setting Time For Goal Achievement: 08/22/16 Potential to Achieve Goals: Good ADL Goals Pt Will Perform Grooming: with set-up;sitting Pt Will Perform Upper Body Bathing: with set-up;with supervision;sitting Pt Will Perform Lower Body Bathing: with set-up;with supervision;sit to/from stand Pt Will Perform Upper Body Dressing: with set-up;with supervision;sitting Pt Will Perform Lower Body Dressing: with set-up;with supervision;sit to/from stand Pt Will Transfer to Toilet: with min guard assist;bedside commode;ambulating  OT Frequency: Min 2X/week   Barriers to D/C:            Co-evaluation              AM-PAC PT "6 Clicks" Daily Activity     Outcome Measure Help from another person eating meals?: A Little Help from another person taking care of personal grooming?: A Little Help from another person toileting, which includes using toliet, bedpan, or urinal?: A Little Help from another person bathing (including washing, rinsing, drying)?: A Little Help from another person to put on and taking off regular upper body clothing?: A Little Help from another person to put on and taking off regular lower body clothing?: A Little 6 Click Score: 18   End of Session Nurse Communication: Mobility status  Activity Tolerance: Patient tolerated treatment well Patient left: in bed;with call bell/phone within reach;with bed alarm set  OT Visit Diagnosis: Unsteadiness on feet (R26.81);Other abnormalities of gait and mobility (R26.89);Muscle weakness (generalized) (M62.81);Feeding difficulties (R63.3);Cognitive communication deficit (R41.841) Symptoms and signs involving cognitive functions: Cerebral infarction                Time: 1550-1605 OT Time  Calculation (min): 15 min Charges:  OT General Charges $OT Visit: 1 Procedure OT Evaluation $  OT Eval Moderate Complexity: 1 Procedure G-Codes:     Mikiah Durall, OT/L  [phone] 07/25/2016  Kataleah Bejar,HILLARY 07/25/2016, 5:30 PM

## 2016-07-25 NOTE — Progress Notes (Signed)
Nutrition Follow-up  INTERVENTION:   Ensure Enlive po BID, each supplement provides 350 kcal and 20 grams of protein  NUTRITION DIAGNOSIS:   Inadequate oral intake related to dysphagia as evidenced by  (altered texture diet). Ongoing.   GOAL:   Patient will meet greater than or equal to 90% of their needs Progressing  MONITOR:   PO intake, Supplement acceptance, Diet advancement  ASSESSMENT:   Pt with PMH of DM, HTN, ETOH and polysubstance abuse admitted with seizures. Intubated for airway protection.    Pt discussed during ICU rounds and with RN.  5/23 extubated  Pt reports no recent IVDU or ETOH. He lives with brother and has had no recent weight changes or changes in appetite per his report.  Labs reviewed: cbgs- [phone]  Diet Order:  DIET DYS 2 Room service appropriate? Yes; Fluid consistency: Nectar Thick  Skin:  Reviewed, no issues  Last BM:  5/23  Height:   Ht Readings from Last 1 Encounters:  07/22/16 5\' 9"  (1.753 m)    Weight:   Wt Readings from Last 1 Encounters:  07/22/16 160 lb (72.6 kg)    Ideal Body Weight:  72.7 kg  BMI:  Body mass index is 23.63 kg/m.  Estimated Nutritional Needs:   Kcal:  1700-1900  Protein:  85-95 grams  Fluid:  >1.7L/day   EDUCATION NEEDS:   No education needs identified at this time  Koleen Distance MS, RD, LDN Pager #- [phone]

## 2016-07-25 NOTE — Evaluation (Signed)
Physical Therapy Evaluation Patient Details Name: Richard Bennett MRN: [id number] DOB: 04-05-1950 Today's Date: 07/25/2016   History of Present Illness  Patient is a 66 y/o male with new onset seizure and acute encephalopathy of unclear etiology, hypertensive emergency vs metabolic encephalopathy. Intubated 5/21-5/23. 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  Clinical Impression  Patient presents with mild right sided weakness, decreased awareness, impaired problem solving, impaired balance and decreased functional mobility s/p above. Tolerated standing and taking a few steps to get to chair with Min A of 2 for safety especially due to instability, impulsivity and lack of awareness of deficits. Pt independent and lives alone PTA. Has supportive brother per pt report. Would benefit from intensive therapies so pt can maximize independence and mobility and return to PLOF. Will follow acutely.    Follow Up Recommendations CIR    Equipment Recommendations  Other (comment) (TBA)    Recommendations for Other Services Rehab consult     Precautions / Restrictions Precautions Precautions: Fall Restrictions Weight Bearing Restrictions: No      Mobility  Bed Mobility Overal bed mobility: Needs Assistance Bed Mobility: Rolling;Sidelying to Sit Rolling: Min guard Sidelying to sit: Min assist;HOB elevated       General bed mobility comments: Pt rolling on side and trying to elevate trunk without using UEs with much difficulty losing balance back on bed; requires Min A to elevate trunk. Some instability sitting EOB.  Transfers Overall transfer level: Needs assistance Equipment used: None Transfers: Sit to/from Stand Sit to Stand: Min assist         General transfer comment: Min A to power to standing with unsteadiness. Transferred to chair with Min A of 2 for  safety.  Ambulation/Gait Ambulation/Gait assistance: Min assist;+2 physical assistance Ambulation Distance (Feet): 6 Feet Assistive device: 1 person hand held assist Gait Pattern/deviations: Step-to pattern;Step-through pattern;Staggering left;Staggering right;Narrow base of support;Decreased stance time - right   Gait velocity interpretation: Below normal speed for age/gender General Gait Details: Very slow, staggering like gait with LOB to right/left x3 for this short distance; Instability of right knee. Decreased awareness of deficits.  Stairs            Wheelchair Mobility    Modified Rankin (Stroke Patients Only) Modified Rankin (Stroke Patients Only) Pre-Morbid Rankin Score: No symptoms Modified Rankin: Moderately severe disability     Balance Overall balance assessment: Needs assistance Sitting-balance support: Feet supported;No upper extremity supported Sitting balance-Leahy Scale: Fair Sitting balance - Comments: Some sway and truncal instability noted sitting EOB. Total A to donn socks.    Standing balance support: During functional activity;Single extremity supported Standing balance-Leahy Scale: Poor Standing balance comment: Requires UE support in standing. Unsteady.                             Pertinent Vitals/Pain Pain Assessment: No/denies pain    Home Living Family/patient expects to be discharged to:: Private residence Living Arrangements: Alone Available Help at Discharge: Family;Available PRN/intermittently Type of Home: Apartment Home Access: Elevator     Home Layout: One level Home Equipment: None      Prior Function Level of Independence: Independent         Comments: Report she is retired; not working. Drives. Has a brother near by.     Hand Dominance   Dominant Hand: Right    Extremity/Trunk Assessment  Upper Extremity Assessment Upper Extremity Assessment: Defer to OT evaluation (Mild drift noted RUE.)     Lower Extremity Assessment Lower Extremity Assessment: RLE deficits/detail;LLE deficits/detail RLE Deficits / Details: Grossly ~4/5 throughout but difficulty following commands to perform MMT. RLE Sensation:  (Reports WFL.) LLE Deficits / Details: Grossly ~4+/5 throughout but difficulty following commands to perform MMT. LLE Sensation:  (Reports WFL.)       Communication   Communication: No difficulties  Cognition Arousal/Alertness: Awake/alert Behavior During Therapy: Flat affect;Impulsive (impulsive at times.) Overall Cognitive Status: Impaired/Different from baseline Area of Impairment: Orientation;Following commands;Awareness;Safety/judgement;Problem solving                 Orientation Level: Disoriented to;Time (unable to state correct year; "1998" Able to state yes to president Trump but with cues.)     Following Commands: Follows multi-step commands inconsistently (Repetition of multistep commands needed.) Safety/Judgement: Decreased awareness of safety;Decreased awareness of deficits Awareness: Intellectual Problem Solving: Slow processing;Requires verbal cues General Comments: Reports having no deficits and feeling "normal" despite having LOB x2 to get to chair; poor awareness. Delayed processing to respond.      General Comments General comments (skin integrity, edema, etc.): VSS.    Exercises     Assessment/Plan    PT Assessment Patient needs continued PT services  PT Problem List Decreased strength;Decreased mobility;Decreased safety awareness;Decreased activity tolerance;Decreased balance;Decreased cognition       PT Treatment Interventions Therapeutic activities;Gait training;Therapeutic exercise;Patient/family education;Balance training;Stair training;Functional mobility training;Neuromuscular re-education;DME instruction;Cognitive remediation    PT Goals (Current goals can be found in the Care Plan section)  Acute Rehab PT Goals Patient Stated  Goal: none stated PT Goal Formulation: With patient Time For Goal Achievement: 08/08/16 Potential to Achieve Goals: Fair    Frequency Min 4X/week   Barriers to discharge Decreased caregiver support lives alone    Co-evaluation               AM-PAC PT "6 Clicks" Daily Activity  Outcome Measure Difficulty turning over in bed (including adjusting bedclothes, sheets and blankets)?: None Difficulty moving from lying on back to sitting on the side of the bed? : Total Difficulty sitting down on and standing up from a chair with arms (e.g., wheelchair, bedside commode, etc,.)?: Total Help needed moving to and from a bed to chair (including a wheelchair)?: A Little Help needed walking in hospital room?: A Lot Help needed climbing 3-5 steps with a railing? : A Lot 6 Click Score: 13    End of Session Equipment Utilized During Treatment: Gait belt Activity Tolerance: Patient tolerated treatment well Patient left: in chair;with call bell/phone within reach;with chair alarm set Nurse Communication: Mobility status PT Visit Diagnosis: Unsteadiness on feet (R26.81);Other abnormalities of gait and mobility (R26.89) Hemiplegia - caused by: Cerebral infarction    Time: 3785-8850 PT Time Calculation (min) (ACUTE ONLY): 25 min   Charges:   PT Evaluation $PT Eval Moderate Complexity: 1 Procedure PT Treatments $Therapeutic Activity: 8-22 mins   PT G Codes:        Wray Kearns, PT, DPT [phone]    Marguarite Arbour A Glendell Schlottman 07/25/2016, 9:58 AM

## 2016-07-25 NOTE — Progress Notes (Signed)
PULMONARY / CRITICAL CARE MEDICINE   Name: Richard Bennett MRN: [id number] DOB: [birth year]    ADMISSION DATE:  07/21/2016  CHIEF COMPLAINT:  Seizure   HISTORY OF PRESENT ILLNESS:   66 yo male Jehovah's Witness (does not accept blood products) with h/o DM, HTN, ETOH and polysubstance (including IVDA) abuse.BIBA found to be having focal seizure in the parking lot of his apartment complex.  EMS gave 5mg  versed in route.  Had long post ictal phase and was [place].  In the ED 1g of Keppra IV, CT head was unremarkable. He was intubated for airway protection and admitted to ICU.   SUBJECTIVE:  Calm  Off vent  VITAL SIGNS: Temp:  [97.6 F (36.4 C)-98.8 F (37.1 C)] 98.8 F (37.1 C) (05/24 0740) Pulse Rate:  [61-113] 110 (05/24 0800) Resp:  [0-25] 15 (05/24 0800) BP: (121-176)/(61-113) 134/61 (05/24 0800) SpO2:  [94 %-100 %] 94 % (05/24 0800) FiO2 (%):  [40 %] 40 % (05/23 0907) HEMODYNAMICS:   VENTILATOR SETTINGS: Vent Mode: PRVC FiO2 (%):  [40 %] 40 % Set Rate:  [18 bmp] 18 bmp Vt Set:  [550 mL] 550 mL PEEP:  [5 cmH20] 5 cmH20 Plateau Pressure:  [15 cmH20] 15 cmH20 INTAKE / OUTPUT:  Intake/Output Summary (Last 24 hours) at 07/25/16 0849 Last data filed at 07/25/16 0630  Gross per 24 hour  Intake              230 ml  Output             1975 ml  Net            -1745 ml    PHYSICAL EXAMINATION: General: awake, in bed, calm, no distress Neuro: awake nonfocal, clam, no seizures HEENT: jvd nwl PULM: CTA good air entry CV:  s1 s2 RRR no r GI: abdo soft, bs wnll, no r Extremities: no edme    LABS:  CBC  Recent Labs Lab 07/21/16 2304 07/21/16 2316 07/22/16 0346 07/23/16 1129  WBC 5.5  --  12.0* 7.9  HGB 11.8* 12.6* 12.1* 9.0*  HCT 35.3* 37.0* 35.6* 27.5*  PLT 227  --  180 164   Coag's  Recent Labs Lab 07/23/16 1129  INR 1.04   BMET  Recent Labs Lab 07/24/16 0651 07/24/16 2032 07/25/16 0438  NA 138 141 143  K 5.4* 3.6 3.7  CL 108 110 111  CO2  19* 21* 18*  BUN 34* 25* 28*  CREATININE 2.09* 2.00* 2.18*  GLUCOSE 166* 120* 126*   Electrolytes  Recent Labs Lab 07/22/16 0346  07/23/16 1020 07/24/16 0651 07/24/16 2032 07/25/16 0438  CALCIUM 8.8*  < > 8.4* 8.4* 8.6* 8.4*  MG 1.5*  --  2.2  --   --   --   PHOS 2.9  --  3.5  --   --   --   < > = values in this interval not displayed. Sepsis Markers  Recent Labs Lab 07/21/16 2316 07/22/16 0247  LATICACIDVEN 5.57* 1.79   ABG  Recent Labs Lab 07/21/16 2309 07/22/16 0109  PHART 7.320* 7.322*  PCO2ART 50.2* 53.5*  PO2ART 215.0* 177.0*   Liver Enzymes  Recent Labs Lab 07/21/16 2304  AST 33  ALT 25  ALKPHOS 80  BILITOT 0.6  ALBUMIN 3.3*   Cardiac Enzymes  Recent Labs Lab 07/22/16 0346 07/22/16 0756 07/22/16 1551  TROPONINI 0.06* 0.13* 0.14*   Glucose  Recent Labs Lab 07/24/16 1208 07/24/16 1600 07/24/16 1925 07/24/16 2301 07/25/16 0314 07/25/16  [place]    Imaging No results found.   ASSESSMENT / PLAN: 66 yo AAF with new onset seizure and acute encephalopathy of unclear etiology, suspect HHS, hypertensive emergency vs metabolic encephalopathy. Intubated for airway protection and admitted to ICU. MRI concerning for possible CVA.   PULMONARY A: Intubated for airway protection Acute resp failure improved P:   IS  CARDIOVASCULAR A:  Hypertensive emergency Sinus tachycardia Hyperlipidemia  P:  Continuing home metoprolol, norvasc Keep SBP < 140/150 lowering PRN hydralazine Continuing home Atorvastatin 80mg  Qday ASA 325mg  Qday  RENAL A:   CKD - at baseline Scr Hypomagnesemia  Lactatemia - 2/2 seizure  (cleared) retention P:   Assess foley dc in 24 hours Neg balance noted on own k resolved Chem in am , likley near baseline  GASTROINTESTINAL A:   Loose stools P:   SLP needed  HEMATOLOGIC A:   Anemia, dilution? P:  Follow CBC lovenox  Dc when walking PT  INFECTIOUS A:  No  meningitis unlikely encpeh  P:   follow hsv pcr  ENDOCRINE A:   HHS DM II HLD   P:   Lantus may need reduction, Hold when npo) SSI  NEUROLOGIC A:   Acute encephalopathy likely metabolic Seizure - new onset likely metabolic History of polysubstance abuse (ETOH, IVDA. Benzos positive on UDS likely due to EMS dose. Co-ingestants negative) No meningitis, r/o encpeh, unlikely P:   RASS goal: 0  Has no neck pain, dc collar, c ct neg - cleared This may have been small watershed with seziures  DVT PPX: Lovenox  Code status:  Full  FAMILY  - Updates: none   Lavon Paganini. Titus Mould, MD, [place] Pgr: [facility] 07/25/2016 8:49 AM

## 2016-07-26 ENCOUNTER — Inpatient Hospital Stay (HOSPITAL_COMMUNITY): Payer: Medicare HMO

## 2016-07-26 ENCOUNTER — Encounter (HOSPITAL_COMMUNITY): Payer: Self-pay | Admitting: Family Medicine

## 2016-07-26 DIAGNOSIS — E1165 Type 2 diabetes mellitus with hyperglycemia: Secondary | ICD-10-CM

## 2016-07-26 DIAGNOSIS — Z794 Long term (current) use of insulin: Secondary | ICD-10-CM

## 2016-07-26 DIAGNOSIS — I639 Cerebral infarction, unspecified: Secondary | ICD-10-CM

## 2016-07-26 DIAGNOSIS — E1159 Type 2 diabetes mellitus with other circulatory complications: Secondary | ICD-10-CM

## 2016-07-26 LAB — COMPREHENSIVE METABOLIC PANEL
ALT: 23 U/L (ref 17–63)
ANION GAP: 7 (ref 5–15)
AST: 34 U/L (ref 15–41)
Albumin: 2.5 g/dL — ABNORMAL LOW (ref 3.5–5.0)
Alkaline Phosphatase: 74 U/L (ref 38–126)
BUN: 33 mg/dL — ABNORMAL HIGH (ref 6–20)
CO2: 20 mmol/L — AB (ref 22–32)
Calcium: 8.5 mg/dL — ABNORMAL LOW (ref 8.9–10.3)
Chloride: 114 mmol/L — ABNORMAL HIGH (ref 101–111)
Creatinine, Ser: 2.04 mg/dL — ABNORMAL HIGH (ref 0.61–1.24)
GFR calc non Af Amer: 33 mL/min — ABNORMAL LOW (ref >60)
GFR, EST AFRICAN AMERICAN: 38 mL/min — AB (ref >60)
Glucose, Bld: 255 mg/dL — ABNORMAL HIGH (ref 65–99)
Potassium: 3.9 mmol/L (ref 3.5–5.1)
SODIUM: 141 mmol/L (ref 135–145)
Total Bilirubin: 0.2 mg/dL — ABNORMAL LOW (ref 0.3–1.2)
Total Protein: 6 g/dL — ABNORMAL LOW (ref 6.5–8.1)

## 2016-07-26 LAB — GLUCOSE, CAPILLARY
GLUCOSE-CAPILLARY: 178 mg/dL — AB (ref 65–99)
Glucose-Capillary: 196 mg/dL — ABNORMAL HIGH (ref 65–99)
Glucose-Capillary: 229 mg/dL — ABNORMAL HIGH (ref 65–99)
Glucose-Capillary: 247 mg/dL — ABNORMAL HIGH (ref 65–99)
Glucose-Capillary: 209 mg/dL — ABNORMAL HIGH (ref 65–99)

## 2016-07-26 LAB — CSF CULTURE W GRAM STAIN: Culture: NO GROWTH

## 2016-07-26 LAB — CBC WITH DIFFERENTIAL/PLATELET
Basophils Absolute: 0 10*3/uL (ref 0.0–0.1)
Basophils Relative: 0 %
Eosinophils Absolute: 0.1 10*3/uL (ref 0.0–0.7)
Eosinophils Relative: 1 %
HCT: 31.2 % — ABNORMAL LOW (ref 39.0–52.0)
Hemoglobin: 10 g/dL — ABNORMAL LOW (ref 13.0–17.0)
LYMPHS ABS: 1.4 10*3/uL (ref 0.7–4.0)
Lymphocytes Relative: 15 %
MCH: 26.1 pg (ref 26.0–34.0)
MCHC: 32.1 g/dL (ref 30.0–36.0)
MCV: 81.5 fL (ref 78.0–100.0)
Monocytes Absolute: 0.6 10*3/uL (ref 0.1–1.0)
Monocytes Relative: 7 %
Neutro Abs: 7.1 10*3/uL (ref 1.7–7.7)
Neutrophils Relative %: 77 %
PLATELETS: 251 10*3/uL (ref 150–400)
RBC: 3.83 MIL/uL — AB (ref 4.22–5.81)
RDW: 13.3 % (ref 11.5–15.5)
WBC: 9.2 10*3/uL (ref 4.0–10.5)

## 2016-07-26 LAB — HEMOGLOBIN A1C
HEMOGLOBIN A1C: 11.9 % — AB (ref 4.8–5.6)
MEAN PLASMA GLUCOSE: 295 mg/dL

## 2016-07-26 LAB — CSF CULTURE

## 2016-07-26 MED ORDER — FERROUS SULFATE 325 (65 FE) MG PO TABS
325.0000 mg | ORAL_TABLET | Freq: Every day | ORAL | Status: DC
  Administered 2016-07-27: 325 mg via ORAL
  Filled 2016-07-26: qty 1

## 2016-07-26 MED ORDER — INSULIN ASPART 100 UNIT/ML ~~LOC~~ SOLN
6.0000 [IU] | Freq: Three times a day (TID) | SUBCUTANEOUS | Status: DC
  Administered 2016-07-26 – 2016-07-27 (×3): 6 [IU] via SUBCUTANEOUS

## 2016-07-26 MED ORDER — SODIUM CHLORIDE 0.45 % IV SOLN
INTRAVENOUS | Status: DC
  Administered 2016-07-27: via INTRAVENOUS

## 2016-07-26 MED ORDER — INSULIN GLARGINE 100 UNIT/ML ~~LOC~~ SOLN
18.0000 [IU] | Freq: Every day | SUBCUTANEOUS | Status: DC
Start: 2016-07-27 — End: 2016-07-27
  Administered 2016-07-27: 18 [IU] via SUBCUTANEOUS
  Filled 2016-07-26: qty 0.18

## 2016-07-26 MED ORDER — INSULIN ASPART 100 UNIT/ML ~~LOC~~ SOLN: 5.0000 [IU] | Freq: Three times a day (TID) | SUBCUTANEOUS | Status: DC

## 2016-07-26 NOTE — Clinical Social Work Note (Signed)
Clinical Social Work Assessment  Patient Details  Name: Richard Bennett MRN: [id number] Date of Birth: 1950-05-14  Date of referral:  07/26/16               Reason for consult:  Facility Placement, Discharge Planning                Permission sought to share information with:  Facility Art therapist granted to share information::  Yes, Verbal Permission Granted  Name::        Agency::  SNF  Relationship::     Contact Information:     Housing/Transportation Living arrangements for the past 2 months:  Apartment Source of Information:  Patient Patient Interpreter Needed:  None Criminal Activity/Legal Involvement Pertinent to Current Situation/Hospitalization:  No - Comment as needed Significant Relationships:  Siblings Lives with:  Self Do you feel safe going back to the place where you live?  Yes Need for family participation in patient care:  No (Coment)  Care giving concerns:  Pt currently lives alone and has increased caregiving needs that require support and therapy.   Social Worker assessment / plan:  CSW explained recommendation for CIR, with pursuing SNF as back-up pending insurance authorization. CSW provided list of facilities that would be able to accept the pt, and answered pt questions.  Employment status:  Retired Nurse, adult PT Recommendations:  Inpatient [place] / Referral to community resources:  [place]  Patient/Family's Response to care:  Pt is agreeable to SNF placement as another option if CIR doesn't get authorized.  Patient/Family's Understanding of and Emotional Response to Diagnosis, Current Treatment, and Prognosis:  Pt understanding of CSW role in discharge planning. Pt seemed to understanding current physical limitations given medical condition.  Emotional Assessment Appearance:  Appears stated age Attitude/Demeanor/Rapport:    Affect (typically observed):   Appropriate Orientation:  Oriented to Situation, Oriented to Place, Oriented to Self Alcohol / Substance use:  Not Applicable Psych involvement (Current and /or in the community):  No (Comment)  Discharge Needs  Concerns to be addressed:  Discharge Planning Concerns, Care Coordination Readmission within the last 30 days:  No Current discharge risk:  Physical Impairment, Lives alone Barriers to Discharge:  Continued Medical Work up   [employer], [place] 07/26/2016, 5:13 PM

## 2016-07-26 NOTE — Progress Notes (Signed)
Physical Therapy Treatment Patient Details Name: Richard Bennett MRN: [id number] DOB: 1950/06/09 Today's Date: 07/26/2016    History of Present Illness Patient is a 66 y/o male with new onset seizure and acute encephalopathy of unclear etiology, hypertensive emergency vs metabolic encephalopathy. Intubated 5/21-5/23. 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    PT Comments    Pt progressing towards physical therapy goals. Was able to perform transfers and ambulation with min assist for balance support and safety. Pt continues to demonstrate difficulty with multi-step commands, dual task, and higher level balance activity. Multimodal cues required due to slow processing. Will continue to follow and progress as able per POC.    Follow Up Recommendations  CIR     Equipment Recommendations  Other (comment) (TBA)    Recommendations for Other Services Rehab consult     Precautions / Restrictions Precautions Precautions: Fall Restrictions Weight Bearing Restrictions: No    Mobility  Bed Mobility Overal bed mobility: Needs Assistance Bed Mobility: Supine to Sit     Supine to sit: Min guard     General bed mobility comments: Increased time required. Initially, pt with poor form and attempting to complete without UE support causing continued psoterior LOB. Waited to see if pt could self-correct and was cued for use of rails. Hands-on guarding for trunk as pt finally was able to transition to EOB.   Transfers Overall transfer level: Needs assistance Equipment used: None Transfers: Sit to/from Stand Sit to Stand: Min assist         General transfer comment: Hands-on assist provided for balance support. Unsteady without UE support. Declines use of RW or cane.   Ambulation/Gait Ambulation/Gait assistance: Min assist Ambulation Distance (Feet): 200  Feet Assistive device: None Gait Pattern/deviations: Step-through pattern;Staggering left;Staggering right;Narrow base of support;Decreased stance time - right Gait velocity: Decreased Gait velocity interpretation: Below normal speed for age/gender General Gait Details: Slow, staggering like gait with LOB to right/left x4. Instability of right knee noted. Decreased awareness of deficits. Attempted to have pt demonstrate vertical and horizontal head turns during gait training but he was not able to complete. Feel this was a combination of difficulty processing dual task as well as following commands.    Stairs            Wheelchair Mobility    Modified Rankin (Stroke Patients Only) Modified Rankin (Stroke Patients Only) Pre-Morbid Rankin Score: No symptoms Modified Rankin: Moderately severe disability     Balance Overall balance assessment: Needs assistance Sitting-balance support: Feet supported;No upper extremity supported Sitting balance-Leahy Scale: Fair Sitting balance - Comments: Some sway and truncal instability noted sitting EOB. Total A to donn socks.    Standing balance support: During functional activity;Single extremity supported Standing balance-Leahy Scale: Poor Standing balance comment: Requires UE support in standing. Unsteady.                            Cognition Arousal/Alertness: Awake/alert Behavior During Therapy: Flat affect;Impulsive Overall Cognitive Status: Impaired/Different from baseline Area of Impairment: Following commands;Awareness;Safety/judgement;Problem solving;Attention;Memory                   Current Attention Level: Selective Memory: Decreased short-term memory Following Commands: Follows one step commands with increased time (Repetition of multistep commands needed.) Safety/Judgement: Decreased awareness of safety;Decreased awareness of deficits Awareness: Emergent Problem Solving: Slow processing;Requires verbal  cues General  Comments: Poor insight to deficits. Pt reports he is "back to normal" and does not feel he needs to improve on anything.      Exercises      General Comments        Pertinent Vitals/Pain Pain Assessment: No/denies pain    Home Living                      Prior Function            PT Goals (current goals can now be found in the care plan section) Acute Rehab PT Goals Patient Stated Goal: none stated PT Goal Formulation: With patient Time For Goal Achievement: 08/08/16 Potential to Achieve Goals: Fair Progress towards PT goals: Progressing toward goals    Frequency    Min 4X/week      PT Plan Current plan remains appropriate    Co-evaluation              AM-PAC PT "6 Clicks" Daily Activity  Outcome Measure  Difficulty turning over in bed (including adjusting bedclothes, sheets and blankets)?: None Difficulty moving from lying on back to sitting on the side of the bed? : Total Difficulty sitting down on and standing up from a chair with arms (e.g., wheelchair, bedside commode, etc,.)?: Total Help needed moving to and from a bed to chair (including a wheelchair)?: A Little Help needed walking in hospital room?: A Lot Help needed climbing 3-5 steps with a railing? : A Lot 6 Click Score: 13    End of Session Equipment Utilized During Treatment: Gait belt Activity Tolerance: Patient tolerated treatment well Patient left: in chair;with call bell/phone within reach;with chair alarm set Nurse Communication: Mobility status PT Visit Diagnosis: Unsteadiness on feet (R26.81);Other abnormalities of gait and mobility (R26.89) Hemiplegia - caused by: Cerebral infarction     Time: 5208-0223 PT Time Calculation (min) (ACUTE ONLY): 27 min  Charges:  $Gait Training: 23-37 mins                    G Codes:       Rolinda Roan, PT, DPT Acute Rehabilitation Services Pager: [phone]    Thelma Comp 07/26/2016, 2:44 PM

## 2016-07-26 NOTE — Consult Note (Signed)
   Patient name: Richard Bennett MRN: [id number] DOB: 1950/09/06 Sex: male  Asked to consult regarding incidental finding of high-grade left carotid stenosis. I did review his chart.  On seeing him this evening, he is very difficult to arouse. Does answer questions. Is lying in a position with the diarrhea stool all over himself.  I discussed with his nurse. She is not really sure of his baseline but was reported that he was quite somnolent before. She will notify his admitting physician if this is a change in his neurologic status. I will see him for consultation tomorrow. May require left carotid endarterectomy in the future as he stabilizes

## 2016-07-26 NOTE — Progress Notes (Signed)
Subjective: He was asleep when I entered, he states that he feel sleepy when I ask him how he is doing    Exam: Vitals:   07/26/16 1337 07/26/16 1730  BP: (!) 158/75 (!) 157/69  Pulse: 92 88  Resp: 18 18  Temp: 98.4 F (36.9 C) 98.4 F (36.9 C)   Gen: In bed, NAD Resp: Nonlabored breathing Abd: soft, nt  Neuro: MS: Somnolent but easily arousable, no Aphasia XM:IWOEH, VFF Motor: 5/5 throughout,  Sensory: intact to LT  Impression: [name] with focal seizures in the setting of elevated BG, hypertension. Though a BG > 600 could be responsible for focal seizures, I typically see this with higher BG levels.  I do not think that he needs long-term antiepileptic therapy at this time given that it could've been a provoked seizure. I'm not confident enough that it is a provoked seizure, however, to avoid driving restrictions.  I supect that his subacute infarcts are incidental.  Given that he has carotid stenosis on the left, I suspect that he was hypotensive at some point prior to his presentation (timing based on the appearance on MRI). The fact that his left side was stenotic is the reason that the watershed area infarcts are unilateral.  Recommendations: 1) vascular surgery consult.  2) I have discussed with him that he is not allowed to drive for 6 months from the time of the most recent seizure 3) no further recommendations at this time, neurology will sign off, please call with further questions or concerns  Roland Rack, MD [facility] [phone]  If 7pm- 7am, please page neurology on call as listed in [place].

## 2016-07-26 NOTE — Progress Notes (Addendum)
Inpatient Rehabilitation  At this time, I have not received authorization from [facility] and am unsure of when I will.  If the patient remains in house over the weekend, plan for my co-worker to follow up on Monday.  Please call with questions.   Carmelia Roller., CCC/SLP Admission Coordinator  [place]  Cell [phone]

## 2016-07-26 NOTE — Care Management Important Message (Signed)
Important Message  Patient Details  Name: Richard Bennett MRN: [id number] Date of Birth: [birth year]   Medicare Important Message Given:  Yes    Shacarra Choe 07/26/2016, 1:26 PM

## 2016-07-26 NOTE — Progress Notes (Signed)
SLP Cancellation Note  Patient Details Name: Richard Bennett MRN: [id number] DOB: [birth year]   Cancelled treatment:       Reason Eval/Treat Not Completed: Patient at procedure or test/unavailable   Germain Osgood 07/26/2016, 4:05 PM  Germain Osgood, M.A. CCC-SLP [phone]

## 2016-07-26 NOTE — NC FL2 (Signed)
[place] LEVEL OF CARE SCREENING TOOL     IDENTIFICATION  Patient Name: Darril Patriarca Birthdate: 04/17/1950 Sex: male Admission Date (Current Location): 07/21/2016  [facility] and [place] Number:  Herbalist and Address:  [facility] [place] [address], [place], [place] [address]      Provider Number: [id number]  Attending Physician Name and Address:  Murlean Iba, MD  Relative Name and Phone Number:       Current Level of Care: Hospital Recommended Level of Care: [place] Prior Approval Number:    Date Approved/Denied:   PASRR Number: [id number]  Discharge Plan: SNF    Current Diagnoses: Patient Active Problem List   Diagnosis Date Noted  . Gait disturbance, post-stroke   . Encephalopathy acute 07/22/2016  . Seizure ([place])   . Type 1 diabetes mellitus with ketoacidotic coma ([place])   . Respiratory arrest ([place])   . Abdominal pain 01/05/2016  . Chest pain 10/31/2015  . Hypertensive emergency 10/30/2015  . Hyponatremia 10/30/2015  . AKI (acute kidney injury) ([place]) 10/30/2015  . Pain in the chest   . Essential hypertension   . Hyperlipidemia   . Diabetes mellitus without complication ([place])     Orientation RESPIRATION BLADDER Height & Weight     Self, Time, Situation, Place  Normal Continent Weight: 160 lb (72.6 kg) Height:  5\' 9"  (175.3 cm)  BEHAVIORAL SYMPTOMS/MOOD NEUROLOGICAL BOWEL NUTRITION STATUS    Convulsions/Seizures Continent    AMBULATORY STATUS COMMUNICATION OF NEEDS Skin   Limited Assist Verbally Normal                       Personal Care Assistance Level of Assistance  Bathing, Dressing Bathing Assistance: Limited assistance   Dressing Assistance: Limited assistance     Functional Limitations Info             SPECIAL CARE FACTORS FREQUENCY  PT (By licensed PT), OT (By licensed OT)     PT Frequency: 5x/wk OT Frequency: 5x/wk            Contractures       Additional Factors Info  Code Status, Allergies, Insulin Sliding Scale Code Status Info: Full Allergies Info: NKA   Insulin Sliding Scale Info: Every 4 hours       Current Medications (07/26/2016):  This is the current hospital active medication list Current Facility-Administered Medications  Medication Dose Route Frequency Provider Last Rate Last Dose  .  stroke: mapping our early stages of recovery book   Does not apply Once Greta Doom, MD      . acetaminophen (TYLENOL) tablet 650 mg  650 mg Oral Q6H PRN Raylene Miyamoto, MD      . amLODipine (NORVASC) tablet 5 mg  5 mg Per Tube Daily Vickii Chafe Corine Shelter, MD   5 mg at 07/26/16 1237  . aspirin tablet 325 mg  325 mg Per Tube Daily Lo Corine Shelter, MD   325 mg at 07/26/16 0857  . atorvastatin (LIPITOR) tablet 80 mg  80 mg Per Tube q1800 [place] Nickel, MD   80 mg at 07/25/16 1800  . docusate sodium (COLACE) capsule 100 mg  100 mg Oral BID Raylene Miyamoto, MD   100 mg at 07/25/16 2138  . enoxaparin (LOVENOX) injection 40 mg  40 mg Subcutaneous Q24H Lo Corine Shelter, MD   40 mg at 07/25/16 1538  . famotidine (PEPCID) tablet 20 mg  20 mg Oral Daily Raylene Miyamoto, MD   20 mg at 07/26/16 0857  . feeding supplement (ENSURE ENLIVE) (ENSURE ENLIVE) liquid 237 mL  237 mL Oral BID BM Raylene Miyamoto, MD   237 mL at 07/25/16 1600  . fentaNYL (SUBLIMAZE) injection 50 mcg  50 mcg Intravenous Q2H PRN Vickii Chafe Corine Shelter, MD   50 mcg at 07/25/16 0014  . hydrALAZINE (APRESOLINE) injection 10-20 mg  10-20 mg Intravenous Q4H PRN Corey Harold, NP   10 mg at 07/26/16 0511  . insulin aspart (novoLOG) injection 0-15 Units  0-15 Units Subcutaneous Q4H Raylene Miyamoto, MD   5 Units at 07/26/16 1236  . insulin aspart (novoLOG) injection 5 Units  5 Units Subcutaneous TID WC Johnson, Clanford L, MD      . insulin glargine (LANTUS) injection 15 Units  15 Units Subcutaneous Daily Corey Harold, NP   15 Units at 07/26/16 [phone]  .  losartan (COZAAR) tablet 50 mg  50 mg Oral Daily Corey Harold, NP   50 mg at 07/26/16 0858  . metoprolol tartrate (LOPRESSOR) tablet 12.5 mg  12.5 mg Per Tube BID Vickii Chafe Corine Shelter, MD   12.5 mg at 07/26/16 0858  . midazolam (VERSED) injection 1 mg  1 mg Intravenous Q2H PRN Raylene Miyamoto, MD      . ondansetron [facility]) injection 4 mg  4 mg Intravenous Q6H PRN Corey Harold, NP      . RESOURCE THICKENUP CLEAR   Oral PRN Raylene Miyamoto, MD         Discharge Medications: Please see discharge summary for a list of discharge medications.  Relevant Imaging Results:  Relevant Lab Results:   Additional Information SS#: [id number]  Geralynn Ochs, LCSW

## 2016-07-26 NOTE — Progress Notes (Deleted)
Patient transferring for imaging/radiology.

## 2016-07-26 NOTE — Progress Notes (Signed)
*  PRELIMINARY RESULTS* Vascular Ultrasound Carotid Duplex (Doppler) has been completed.   Findings suggest 1-39% right internal carotid artery stenosis.   The left distal common carotid artery exhibits elevated velocities suggestive of >50% stenosis.   The left proximal internal carotid artery exhibits elevated peak systolic velocities suggestive of low range 80-99% stenosis, and elevated end diastolic velocities suggestive of 40-59% stenosis. This may be due to distal common carotid artery stenosis, however internal carotid artery stenosis cannot be excluded.  The left vertebral artery exhibits an atypical waveform. This finding, along with the elevated left subclavian artery velocity is suggestive of proximal stenosis.   Preliminary results discussed with Dr. Wynetta Emery.  07/26/2016 4:08 PM Maudry Mayhew, BS, RVT, RDCS, RDMS

## 2016-07-26 NOTE — Progress Notes (Addendum)
Inpatient Rehabilitation  Met with patient to discuss team's recommendation for IP Rehab.  Shared booklets and answered questions.  Patient is agreeable to IP Rehab to regain as much independence as possible and reports that his brother can help him out some too if needed.   Insurance authorization has been initiated and I await word today by 5pm.  However, given that today is Friday before a long weekend if insurance is unable to reach a decision today a back up plan may be warranted.  I have spoken with MD and updated RNCM, Kelli.  Plan to update the team as soon as I hear from insurance.  Please call with questions.   Carmelia Roller., CCC/SLP Admission Coordinator  [place]  Cell [phone]

## 2016-07-26 NOTE — Progress Notes (Signed)
PROGRESS NOTE    Richard Bennett  TKZ:[id number]  DOB: 1950/11/01  DOA: 07/21/2016 PCP: [name], MD Outpatient Specialists:   Hospital course: 66 yo male [facility] (does not accept blood products) former long time smoker with h/o DM, HTN, BIBA found to be having focal seizure in the parking lot of his apartment complex.   Had long postictal phase and was unresponsive in the ICU.    Assessment & Plan:   New onset focal seizure - had been on keppra but off now, neuro following.   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  Carotids: (prelim) The left distal common carotid artery exhibits elevated velocities suggestive of >50% stenosis. The left proximal internal carotid artery exhibits elevated peak systolic velocities suggestive of low range 80-99% stenosis, and elevated end diastolic velocities suggestive of 40-59% stenosis. This may be due to distal common carotid artery stenosis, however internal carotid artery stenosis cannot be excluded. The left vertebral artery exhibits an atypical waveform. This finding, along with the elevated left subclavian artery velocity is suggestive of proximal stenosis. Echo: Normal LV systolic function; mild diastolic dysfunction; mild  Left carotid stenosis - discussed with neuro, consult placed for vascular surgery  Uncontrolled diabetes mellitus, type 2, insulin requiring with vascular complications - added prandial coverage 5/25, continue basal and supplemental coverage and titrate as needed for better glycemic control.  CBG (last 3)   Recent Labs  07/26/16 0732 07/26/16 1134 07/26/16 1615  GLUCAP 178* 229* 247*   Hypertensive emergency - resolved now, blood pressure much better controlled  Essential hypertension - continue amlodipine, losartan, metoprolol  Microcytic  anemia - follow daily CBC, supplement iron.    DVT prophylaxis: enoxaparin Code Status: FULL  Family Communication: bedside  Disposition Plan: SNF when medically stable   Consultants:  Neurology   Vascular surgery   Subjective: Pt without complaints. No seizure activity  Objective: Vitals:   07/26/16 0449 07/26/16 0620 07/26/16 1014 07/26/16 1337  BP: (!) 175/83 (!) 160/88 (!) 162/79 (!) 158/75  Pulse: 95 (!) 113 98 92  Resp: 17  18 18   Temp: 98.1 F (36.7 C)  98.7 F (37.1 C) 98.4 F (36.9 C)  TempSrc: Oral  Oral Oral  SpO2: 100%  99% 100%  Weight:      Height:        Intake/Output Summary (Last 24 hours) at 07/26/16 1652 Last data filed at 07/25/16 2354  Gross per 24 hour  Intake                0 ml  Output              540 ml  Net             -540 ml   Filed Weights   07/21/16 2252 07/22/16 0004  Weight: 72.6 kg (160 lb) 72.6 kg (160 lb)   Review of systems: as per history otherwise all reviewed negative   Exam:  General exam: awake, alert, NAD. Flat affect.  Respiratory system: Clear. No increased work of breathing. Cardiovascular system: S1 & S2 heard, RRR. No JVD, murmurs, gallops, clicks or pedal edema. Gastrointestinal system: Abdomen is nondistended, soft and nontender. Normal bowel sounds heard. Central nervous system: Alert and oriented. No focal neurological deficits. Extremities: no CCE.  Data Reviewed: Basic Metabolic Panel:  Recent Labs Lab 07/22/16 [phone]  07/23/16 1020 07/24/16 0651 07/24/16 2032 07/25/16 0438 07/26/16 1035  NA 132*  < > 137 138 141 143 141  K 4.1  < > 4.4 5.4* 3.6 3.7 3.9  CL 99*  < > 106 108 110 111 114*  CO2 20*  < > 21* 19* 21* 18* 20*  GLUCOSE 578*  < > 215* 166* 120* 126* 255*  BUN 23*  < > 31* 34* 25* 28* 33*  CREATININE 1.76*  < > 2.30* 2.09* 2.00* 2.18* 2.04*  CALCIUM 8.8*  < > 8.4* 8.4* 8.6* 8.4* 8.5*  MG 1.5*  --  2.2  --   --   --   --   PHOS 2.9  --  3.5  --   --   --   --   < > = values in this  interval not displayed. Liver Function Tests:  Recent Labs Lab 07/21/16 2304 07/26/16 1035  AST 33 34  ALT 25 23  ALKPHOS 80 74  BILITOT 0.6 0.2*  PROT 6.8 6.0*  ALBUMIN 3.3* 2.5*   No results for input(s): LIPASE, AMYLASE in the last 168 hours.  Recent Labs Lab 07/21/16 2304  AMMONIA 40*   CBC:  Recent Labs Lab 07/21/16 2304 07/21/16 2316 07/22/16 0346 07/23/16 1129 07/26/16 1035  WBC 5.5  --  12.0* 7.9 9.2  NEUTROABS 3.2  --   --   --  7.1  HGB 11.8* 12.6* 12.1* 9.0* 10.0*  HCT 35.3* 37.0* 35.6* 27.5* 31.2*  MCV 79.7  --  78.4 79.7 81.5  PLT 227  --  180 164 251   Cardiac Enzymes:  Recent Labs Lab 07/22/16 0346 07/22/16 0756 07/22/16 1551  TROPONINI 0.06* 0.13* 0.14*   CBG (last 3)   Recent Labs  07/26/16 0732 07/26/16 1134 07/26/16 1615  GLUCAP 178* 229* 247*   Recent Results (from the past 240 hour(s))  Culture, blood (routine x 2)     Status: None (Preliminary result)   Collection Time: 07/22/16  2:40 AM  Result Value Ref Range Status   Specimen Description BLOOD CENTRAL LINE  Final   Special Requests   Final    BOTTLES DRAWN AEROBIC AND ANAEROBIC Blood Culture adequate volume   Culture NO GROWTH 4 DAYS  Final   Report Status PENDING  Incomplete  Culture, blood (routine x 2)     Status: None (Preliminary result)   Collection Time: 07/22/16  4:00 AM  Result Value Ref Range Status   Specimen Description BLOOD RIGHT HAND  Final   Special Requests IN PEDIATRIC BOTTLE Blood Culture adequate volume  Final   Culture NO GROWTH 4 DAYS  Final   Report Status PENDING  Incomplete  MRSA PCR Screening     Status: None   Collection Time: 07/22/16  5:09 AM  Result Value Ref Range Status   MRSA by PCR NEGATIVE NEGATIVE Final    Comment:        The GeneXpert MRSA Assay (FDA approved for NASAL specimens only), is one component of a comprehensive MRSA colonization surveillance program. It is not intended to diagnose MRSA infection nor to guide  or monitor treatment for MRSA infections.   Culture, respiratory (NON-Expectorated)     Status: None   Collection Time: 07/23/16  8:41 AM  Result Value Ref Range Status   Specimen Description TRACHEAL ASPIRATE  Final   Special Requests Normal  Final   Gram Stain   Final    ABUNDANT WBC PRESENT, PREDOMINANTLY PMN ABUNDANT GRAM  POSITIVE COCCI IN PAIRS    Culture ABUNDANT STREPTOCOCCUS PNEUMONIAE  Final   Report Status 07/25/2016 FINAL  Final   Organism ID, Bacteria STREPTOCOCCUS PNEUMONIAE  Final      Susceptibility   Streptococcus pneumoniae - MIC*    ERYTHROMYCIN <=0.12 SENSITIVE Sensitive     LEVOFLOXACIN 1 SENSITIVE Sensitive     PENICILLIN (meningitis) <=0.06 SENSITIVE Sensitive     PENICILLIN (non-meningitis) <=0.06 SENSITIVE Sensitive     CEFTRIAXONE (non-meningitis) <=0.12 SENSITIVE Sensitive     CEFTRIAXONE (meningitis) <=0.12 SENSITIVE Sensitive     * ABUNDANT STREPTOCOCCUS PNEUMONIAE  CSF culture     Status: None   Collection Time: 07/23/16  2:52 PM  Result Value Ref Range Status   Specimen Description CSF  Final   Special Requests TUBE 2  Final   Gram Stain   Final    WBC PRESENT,BOTH PMN AND MONONUCLEAR NO ORGANISMS SEEN CYTOSPIN SMEAR    Culture NO GROWTH 3 DAYS  Final   Report Status 07/26/2016 FINAL  Final    Studies: No results found.  Scheduled Meds: .  stroke: mapping our early stages of recovery book   Does not apply Once  . amLODipine  5 mg Per Tube Daily  . aspirin  325 mg Per Tube Daily  . atorvastatin  80 mg Per Tube q1800  . docusate sodium  100 mg Oral BID  . enoxaparin (LOVENOX) injection  40 mg Subcutaneous Q24H  . famotidine  20 mg Oral Daily  . feeding supplement (ENSURE ENLIVE)  237 mL Oral BID BM  . insulin aspart  0-15 Units Subcutaneous Q4H  . insulin aspart  6 Units Subcutaneous TID WC  . insulin glargine  15 Units Subcutaneous Daily  . losartan  50 mg Oral Daily  . metoprolol tartrate  12.5 mg Per Tube BID   Continuous  Infusions:  Active Problems:   Encephalopathy acute   Seizure (HCC)   Type 1 diabetes mellitus with ketoacidotic coma (HCC)   Respiratory arrest (HCC)   Gait disturbance, post-stroke   Time spent:   Irwin Brakeman, MD, FAAFP [facility] Pager [phone]  If 7PM-7AM, please contact night-coverage [email or web address] Password [email or web address] 07/26/2016, 4:52 PM    LOS: 4 days

## 2016-07-27 DIAGNOSIS — E1165 Type 2 diabetes mellitus with hyperglycemia: Secondary | ICD-10-CM

## 2016-07-27 DIAGNOSIS — IMO0002 Reserved for concepts with insufficient information to code with codable children: Secondary | ICD-10-CM | POA: Diagnosis present

## 2016-07-27 DIAGNOSIS — I6522 Occlusion and stenosis of left carotid artery: Secondary | ICD-10-CM

## 2016-07-27 DIAGNOSIS — E1151 Type 2 diabetes mellitus with diabetic peripheral angiopathy without gangrene: Secondary | ICD-10-CM

## 2016-07-27 LAB — CBC WITH DIFFERENTIAL/PLATELET
BASOS ABS: 0 10*3/uL (ref 0.0–0.1)
Basophils Relative: 0 %
EOS ABS: 0.1 10*3/uL (ref 0.0–0.7)
EOS PCT: 2 %
HCT: 30.9 % — ABNORMAL LOW (ref 39.0–52.0)
Hemoglobin: 9.9 g/dL — ABNORMAL LOW (ref 13.0–17.0)
Lymphocytes Relative: 22 %
Lymphs Abs: 1.6 10*3/uL (ref 0.7–4.0)
MCH: 26.1 pg (ref 26.0–34.0)
MCHC: 32 g/dL (ref 30.0–36.0)
MCV: 81.5 fL (ref 78.0–100.0)
MONO ABS: 1 10*3/uL (ref 0.1–1.0)
Monocytes Relative: 14 %
Neutro Abs: 4.5 10*3/uL (ref 1.7–7.7)
Neutrophils Relative %: 62 %
PLATELETS: 241 10*3/uL (ref 150–400)
RBC: 3.79 MIL/uL — AB (ref 4.22–5.81)
RDW: 13.2 % (ref 11.5–15.5)
WBC: 7.3 10*3/uL (ref 4.0–10.5)

## 2016-07-27 LAB — CULTURE, BLOOD (ROUTINE X 2)
Culture: NO GROWTH
Culture: NO GROWTH
SPECIAL REQUESTS: ADEQUATE
Special Requests: ADEQUATE

## 2016-07-27 LAB — COMPREHENSIVE METABOLIC PANEL
ALT: 24 U/L (ref 17–63)
AST: 33 U/L (ref 15–41)
Albumin: 2.6 g/dL — ABNORMAL LOW (ref 3.5–5.0)
Alkaline Phosphatase: 69 U/L (ref 38–126)
Anion gap: 11 (ref 5–15)
BUN: 31 mg/dL — AB (ref 6–20)
CHLORIDE: 114 mmol/L — AB (ref 101–111)
CO2: 18 mmol/L — AB (ref 22–32)
CREATININE: 1.87 mg/dL — AB (ref 0.61–1.24)
Calcium: 8.7 mg/dL — ABNORMAL LOW (ref 8.9–10.3)
GFR calc non Af Amer: 36 mL/min — ABNORMAL LOW (ref >60)
GFR, EST AFRICAN AMERICAN: 42 mL/min — AB (ref >60)
Glucose, Bld: 139 mg/dL — ABNORMAL HIGH (ref 65–99)
POTASSIUM: 3.9 mmol/L (ref 3.5–5.1)
SODIUM: 143 mmol/L (ref 135–145)
Total Bilirubin: 0.4 mg/dL (ref 0.3–1.2)
Total Protein: 6 g/dL — ABNORMAL LOW (ref 6.5–8.1)

## 2016-07-27 LAB — GLUCOSE, CAPILLARY
GLUCOSE-CAPILLARY: 108 mg/dL — AB (ref 65–99)
Glucose-Capillary: 161 mg/dL — ABNORMAL HIGH (ref 65–99)
Glucose-Capillary: 165 mg/dL — ABNORMAL HIGH (ref 65–99)
Glucose-Capillary: 220 mg/dL — ABNORMAL HIGH (ref 65–99)

## 2016-07-27 MED ORDER — INSULIN ASPART 100 UNIT/ML ~~LOC~~ SOLN: 0.0000 [IU] | Freq: Three times a day (TID) | SUBCUTANEOUS | Status: DC

## 2016-07-27 MED ORDER — ENSURE ENLIVE PO LIQD: 237.0000 mL | Freq: Two times a day (BID) | ORAL | 12 refills | Status: DC

## 2016-07-27 MED ORDER — ATORVASTATIN CALCIUM 80 MG PO TABS: 80.0000 mg | ORAL_TABLET | Freq: Every day | ORAL | Status: DC

## 2016-07-27 MED ORDER — SODIUM BICARBONATE 650 MG PO TABS: 650.0000 mg | ORAL_TABLET | Freq: Two times a day (BID) | ORAL | Status: DC

## 2016-07-27 MED ORDER — AMLODIPINE BESYLATE 5 MG PO TABS: 5.0000 mg | ORAL_TABLET | Freq: Every day | ORAL | Status: DC

## 2016-07-27 MED ORDER — METOPROLOL TARTRATE 25 MG PO TABS: 25.0000 mg | ORAL_TABLET | Freq: Two times a day (BID) | ORAL | Status: DC

## 2016-07-27 MED ORDER — SODIUM BICARBONATE 650 MG PO TABS
650.0000 mg | ORAL_TABLET | Freq: Three times a day (TID) | ORAL | Status: DC
  Administered 2016-07-27: 650 mg via ORAL
  Filled 2016-07-27: qty 1

## 2016-07-27 MED ORDER — ASPIRIN 325 MG PO TABS: 325.0000 mg | ORAL_TABLET | Freq: Every day | ORAL | Status: DC

## 2016-07-27 MED ORDER — METOPROLOL TARTRATE 25 MG PO TABS: 25.0000 mg | ORAL_TABLET | Freq: Two times a day (BID) | ORAL | 0 refills | Status: DC

## 2016-07-27 MED ORDER — FERROUS SULFATE 325 (65 FE) MG PO TABS: 325.0000 mg | ORAL_TABLET | Freq: Every day | ORAL | 3 refills | Status: DC

## 2016-07-27 MED ORDER — METOPROLOL TARTRATE 12.5 MG HALF TABLET: 12.5000 mg | ORAL_TABLET | Freq: Two times a day (BID) | ORAL | Status: DC

## 2016-07-27 MED ORDER — ASPIRIN 325 MG PO TABS
325.0000 mg | ORAL_TABLET | Freq: Every day | ORAL | Status: DC
Start: 2016-07-28 — End: 2017-05-13

## 2016-07-27 MED ORDER — SODIUM CHLORIDE 0.9 % IV SOLN
INTRAVENOUS | Status: DC
  Administered 2016-07-27: 14:00:00 via INTRAVENOUS

## 2016-07-27 NOTE — Consult Note (Signed)
Vascular and Vein Specialist of [facility]  Patient name: Richard Bennett MRN: [id number] DOB: 1950-12-15 Sex: male  REASON FOR CONSULT: Left internal carotid artery stenosis  HPI: Richard Bennett is a 66 y.o. male, who is seen today for value of left internal carotid artery stenosis. Patient was admitted after being found having seizures in a parking lot. Had a very difficult the hospitalization with the initial intubation and now on a regular floor. Workup included carotid duplex from 07/26/2016 showing high-grade left internal carotid artery stenosis. MRI showed watershed type distribution left brain strokes. There is no history of focal deficits. His baseline is unknown. He still is quite blunted and his affect. Is able to answer questions appropriate. Answers that he is right-handed when I questioned this.  Past Medical History:  Diagnosis Date  . Chest pain   . Colon polyps    adenomatous  . Hyperlipidemia   . Hypertension   . Refusal of blood transfusions as patient is Jehovah's Witness   . Type II diabetes mellitus (HCC)     Family History  Problem Relation Age of Onset  . Diabetes Mother   . Cancer Father   . Colon cancer Neg Hx   . Esophageal cancer Neg Hx   . Stomach cancer Neg Hx   . Rectal cancer Neg Hx     SOCIAL HISTORY: Social History   Social History  . Marital status: Divorced    Spouse name: N/A  . Number of children: 4  . Years of education: 12   Occupational History  . Truck - [employer]    Social History Main Topics  . Smoking status: Former Smoker    Packs/day: 0.50    Years: 20.00    Types: Cigarettes    Quit date: 03/04/1978  . Smokeless tobacco: Never Used  . Alcohol use Yes     Comment: 10/30/2015 "might have 2-3 drinks/year"  . Drug use: Yes    Types: Heroin     Comment: 10/30/2015 "nothing in the 2000s"  . Sexual activity: No   Other Topics Concern  . Not on file   Social History Narrative   Fun: Cycling      No Known Allergies  Current Facility-Administered Medications  Medication Dose Route Frequency Provider Last Rate Last Dose  . 0.9 %  sodium chloride infusion   Intravenous Continuous Johnson, Clanford L, MD 65 mL/hr at 07/27/16 1416    . acetaminophen (TYLENOL) tablet 650 mg  650 mg Oral Q6H PRN Raylene Miyamoto, MD      . Derrill Memo ON 07/28/2016] amLODipine (NORVASC) tablet 5 mg  5 mg Oral Daily Johnson, Clanford L, MD      . Derrill Memo ON 07/28/2016] aspirin tablet 325 mg  325 mg Oral Daily Johnson, Clanford L, MD      . atorvastatin (LIPITOR) tablet 80 mg  80 mg Oral q1800 Johnson, Clanford L, MD      . enoxaparin (LOVENOX) injection 40 mg  40 mg Subcutaneous Q24H Lo Corine Shelter, MD   40 mg at 07/27/16 1416  . famotidine (PEPCID) tablet 20 mg  20 mg Oral Daily Raylene Miyamoto, MD   20 mg at 07/27/16 1036  . feeding supplement (ENSURE ENLIVE) (ENSURE ENLIVE) liquid 237 mL  237 mL Oral BID BM Raylene Miyamoto, MD   237 mL at 07/27/16 1417  . ferrous sulfate tablet 325 mg  325 mg Oral Q breakfast Johnson, Clanford L, MD   325 mg at 07/27/16 1036  .  hydrALAZINE (APRESOLINE) injection 10-20 mg  10-20 mg Intravenous Q4H PRN Corey Harold, NP   20 mg at 07/27/16 7829  . insulin aspart (novoLOG) injection 0-15 Units  0-15 Units Subcutaneous TID WC Johnson, Clanford L, MD      . insulin aspart (novoLOG) injection 6 Units  6 Units Subcutaneous TID WC Johnson, Clanford L, MD   6 Units at 07/27/16 1417  . insulin glargine (LANTUS) injection 18 Units  18 Units Subcutaneous Daily Wynetta Emery, Clanford L, MD   18 Units at 07/27/16 1035  . losartan (COZAAR) tablet 50 mg  50 mg Oral Daily Corey Harold, NP   50 mg at 07/27/16 1036  . metoprolol tartrate (LOPRESSOR) tablet 25 mg  25 mg Oral BID Johnson, Clanford L, MD      . ondansetron (ZOFRAN) injection 4 mg  4 mg Intravenous Q6H PRN Corey Harold, NP      . RESOURCE THICKENUP CLEAR   Oral PRN Raylene Miyamoto, MD      . sodium bicarbonate  tablet 650 mg  650 mg Oral TID Wynetta Emery, Clanford L, MD        REVIEW OF SYSTEMS:  Reviewed in his history and physical with nothing to add PHYSICAL EXAM: Vitals:   07/26/16 1730 07/26/16 2042 07/27/16 0051 07/27/16 0514  BP: (!) 157/69 (!) 178/85 (!) 162/86 (!) 182/96  Pulse: 88 89 79 99  Resp: 18 20 20 20   Temp: 98.4 F (36.9 C) 98.6 F (37 C) 98.4 F (36.9 C) 98.6 F (37 C)  TempSrc: Oral Oral Oral Oral  SpO2: 100% 98% 98% 99%  Weight:      Height:        GENERAL: The patient is a well-nourished male, in no acute distress. The vital signs are documented above. CARDIOVASCULAR: 2+ radial and 2+ dorsalis pedis pulses bilaterally PULMONARY: There is good air exchange  ABDOMEN: Soft and non-tender  MUSCULOSKELETAL: There are no major deformities or cyanosis. NEUROLOGIC: No focal weakness or paresthesias are detected. Good grip strength right and left. SKIN: There are no ulcers or rashes noted. PSYCHIATRIC: Able to answer questions very blunted affect  DATA:  High-grade carotid stenosis left carotid duplex  MEDICAL ISSUES: Patient certainly at risk for left brain event due to his high-grade carotid stenosis. Would not entertain endarterectomy until he is recovered from this major neurologic event related to his seizure. We will not follow actively. We will arrange office follow-up with me in several weeks to discuss potential elective endarterectomy. Please call if we can provide assistance as patient   Rosetta Posner, MD [facility] V[facility] and Vein Specialists of [facility] Tel [phone] Pager [phone]

## 2016-07-27 NOTE — Clinical Social Work Note (Signed)
Clinical Social Worker facilitated patient discharge including contacting patient family and facility [facility]) to confirm patient discharge plans.  Clinical information faxed to facility and family agreeable with plan.  CSW arranged ambulance transport via [facility] to [facility]. RN to call report prior to discharge.  Clinical Social Worker will sign off for now as social work intervention is no longer needed. Please consult [place] again if new need arises.  Corie Allis B. Joline Maxcy Clinical Social Work Dept Weekend Social Worker [phone] 3:29 PM
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
## 2016-07-27 NOTE — Discharge Instructions (Signed)
No driving or operating machinery for at least 6 months until you have been cleared by a neurologist.  Check blood sugars at least 3 times per day.   Follow up with vascular surgery in 3 weeks NO SMOKING OR USING RECREATIONAL DRUGS OR ALCOHOL

## 2016-07-27 NOTE — Progress Notes (Signed)
Occupational Therapy Treatment Patient Details Name: Richard Bennett MRN: [id number] DOB: [birth year] Today's Date: 07/27/2016    History of present illness Patient is a 66 y/o male with new onset seizure and acute encephalopathy of unclear etiology, hypertensive emergency vs metabolic encephalopathy. Intubated 5/21-5/23. 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   OT comments  This 66 yo male admitted with above presents to acute OT today making progress with toilet transfers, LBD, and grooming. He will continue to benefit from acute OT with follow up on CIR to work back to PLOF.   Follow Up Recommendations  CIR;Supervision/Assistance - 24 hour    Equipment Recommendations  3 in 1 bedside commode    Recommendations for Other Services Rehab consult    Precautions / Restrictions Precautions Precautions: Fall Restrictions Weight Bearing Restrictions: No       Mobility Bed Mobility Overal bed mobility: Needs Assistance Bed Mobility: Supine to Sit   Sidelying to sit: Supervision          Transfers Overall transfer level: Needs assistance Equipment used: None Transfers: Sit to/from Stand Sit to Stand: Min guard              Balance Overall balance assessment: Needs assistance Sitting-balance support: No upper extremity supported;Feet supported Sitting balance-Leahy Scale: Good     Standing balance support: No upper extremity supported;During functional activity Standing balance-Leahy Scale: Fair Standing balance comment: standing at sink to wash face and hands                           ADL either performed or assessed with clinical judgement   ADL Overall ADL's : Needs assistance/impaired     Grooming: Wash/dry hands;Wash/dry face;Standing               Lower Body Dressing: Min guard;Sit to/from stand   Toilet  Transfer: Minimal assistance;Ambulation;Regular Toilet             General ADL Comments: doff and donned socks without LOB while seated at front of recliner     Vision Patient Visual Report: No change from baseline            Cognition Arousal/Alertness: Awake/alert Behavior During Therapy: Flat affect;Impulsive Overall Cognitive Status: Impaired/Different from baseline Area of Impairment: Safety/judgement;Following commands;Problem solving                       Following Commands: Follows one step commands consistently Safety/Judgement: Decreased awareness of safety;Decreased awareness of deficits   Problem Solving: Slow processing;Requires verbal cues General Comments: Still with poor insight with deficits--feels he is back to baseline                   Pertinent Vitals/ Pain       Pain Assessment: No/denies pain         Frequency  Min 2X/week        Progress Toward Goals  OT Goals(current goals can now be found in the care plan section)  Progress towards OT goals: Progressing toward goals     Plan Discharge plan remains appropriate       AM-PAC PT "6 Clicks" Daily Activity     Outcome Measure   Help from another person eating meals?: A Little Help from another person taking care of personal grooming?: A Little Help from another person toileting, which includes  using toliet, bedpan, or urinal?: A Little Help from another person bathing (including washing, rinsing, drying)?: A Little Help from another person to put on and taking off regular upper body clothing?: A Little Help from another person to put on and taking off regular lower body clothing?: A Little 6 Click Score: 18    End of Session Equipment Utilized During Treatment: Gait belt  OT Visit Diagnosis: Unsteadiness on feet (R26.81);Cognitive communication deficit (R41.841) Symptoms and signs involving cognitive functions: Cerebral infarction   Activity Tolerance Patient  tolerated treatment well   Patient Left in chair;with call bell/phone within reach;with chair alarm set (SLP in room with him)   Nurse Communication          Time: 1440-1455 OT Time Calculation (min): 15 min  Charges: OT General Charges $OT Visit: 1 Procedure OT Treatments $Self Care/Home Management : 8-22 mins  [place], OTR/L [phone] 07/27/2016

## 2016-07-27 NOTE — Clinical Social Work Placement (Signed)
   CLINICAL SOCIAL WORK PLACEMENT  NOTE  Date:  07/27/2016  Patient Details  Name: Coltrane Tugwell MRN: [id number] Date of Birth: January 14, 1951  Clinical Social Work is seeking post-discharge placement for this patient at the [place] level of care (*CSW will initial, date and re-position this form in  chart as items are completed):  Yes   Patient/family provided with [facility]'s list of facilities offering this level of care within the geographic area requested by the patient (or if unable, by the patient's family).  Yes   Patient/family informed of their freedom to choose among providers that offer the needed level of care, that participate in Medicare, Medicaid or managed care program needed by the patient, have an available bed and are willing to accept the patient.  Yes   Patient/family informed of 's ownership interest in [facility] and [facility], as well as of the fact that they are under no obligation to receive care at these facilities.  PASRR submitted to EDS on       PASRR number received on       Existing PASRR number confirmed on 07/26/16     FL2 transmitted to all facilities in geographic area requested by pt/family on       FL2 transmitted to all facilities within larger geographic area on       Patient informed that his/her managed care company has contracts with or will negotiate with certain facilities, including the following:        Yes   Patient/family informed of bed offers received.  Patient chooses bed at  (From [facility])     Physician recommends and patient chooses bed at      Patient to be transferred to  (From [facility]) on  .  Patient to be transferred to facility by  Corey Harold)     Patient family notified on 07/27/16 of transfer.  Name of family member notified:  Brother     PHYSICIAN       Additional Comment:    _______________________________________________ Serafina Mitchell, [place] 07/27/2016, 3:31 PM

## 2016-07-27 NOTE — Progress Notes (Signed)
  Speech Language Pathology Treatment: Dysphagia;Cognitive-Linquistic  Patient Details Name: Richard Bennett MRN: [id number] DOB: [birth year] Today's Date: 07/27/2016 Time: 1430-1450 SLP Time Calculation (min) (ACUTE ONLY): 20 min  Assessment / Plan / Recommendation Clinical Impression  Pt continues with attention deficit/impulsivity during swallowing treatment with intake of nectar-thickened liquids which elicited no s/s of aspiration; skilled observation of thin liquids yielded a delayed cough with intake of larger sips of thin liquids; despite verbal cueing, pt consumed puree consistency with large bites without s/s of aspiration, although, he is at risk for aspiration d/t impulsivity/lack of awareness of deficits.  Pt oriented x3, but continues to exhibit some STM/attention and safety concerns as he asked to get up from chair when SLP was leaving room to brush his teeth.  Pt was educated re: safety awareness and risk of injury d/t current deficits.  Problem solving/reasoning decreased as well with simple functional tasks.  Pt needs continued services; recommend CIR and/or SNF placement until cognitive deficits improve for safety reasons.  ST will f/u while in house for cognition/dysphagia tx.  HPI HPI: 66 yo AAF with new onset seizure and acute encephalopathy of unclear etiology, suspect HHS, hypertensive emergency vs metabolic encephalopathy.      SLP Plan  Continue with current plan of care       Recommendations  Diet recommendations: Dysphagia 2 (fine chop);Nectar-thick liquid Liquids provided via: Cup;No straw Medication Administration: Whole meds with puree Supervision: Patient able to self feed;Full supervision/cueing for compensatory strategies Compensations: Minimize environmental distractions;Slow rate;Small sips/bites                General recommendations: Other(comment) (TBD) Oral Care Recommendations: Oral care BID Follow up Recommendations: 24 hour  supervision/assistance;Other (comment) (TBD) SLP Visit Diagnosis: Dysphagia, unspecified (R13.10);Cognitive communication deficit (T11.735) Plan: Continue with current plan of care                      [name], M.S., CCC-SLP 07/27/2016, 5:07 PM

## 2016-07-27 NOTE — Progress Notes (Signed)
Pt being discharged from hospital per orders from MD. Pt being discharged to [facility]. Pt educated and aware of transfer. Pt's IV was removed prior to transfer. RN called and gave report to Linton Flemings, RN. Pt exited hospital via stretcher.

## 2016-07-27 NOTE — Progress Notes (Signed)
The patella the patient became quite somnolent yesterday. Today, he is appropriate, oriented 3.  Assessment this surgery is seeing the patient, recommended outpatient follow-up. Please call with any further questions or concerns.  Roland Rack, MD [facility] [phone]  If 7pm- 7am, please page neurology on call as listed in [place].

## 2016-07-27 NOTE — Progress Notes (Signed)
PROGRESS NOTE    Richard Bennett  FGH:[id number]  DOB: February 07, 1951  DOA: 07/21/2016 PCP: [name], MD Outpatient Specialists:   Hospital course: 66 yo male [place] (does not accept blood products) former long time smoker with h/o DM, HTN, BIBA found to be having focal seizure in the parking lot of his apartment complex.   Had long postictal phase and was unresponsive in the ICU.    Assessment & Plan:   New onset focal seizure - had been on keppra but off now, neuro following.   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  Carotids: (prelim) The left distal common carotid artery exhibits elevated velocities suggestive of >50% stenosis. The left proximal internal carotid artery exhibits elevated peak systolic velocities suggestive of low range 80-99% stenosis, and elevated end diastolic velocities suggestive of 40-59% stenosis. This may be due to distal common carotid artery stenosis, however internal carotid artery stenosis cannot be excluded. The left vertebral artery exhibits an atypical waveform. This finding, along with the elevated left subclavian artery velocity is suggestive of proximal stenosis. Echo: Normal LV systolic function; mild diastolic dysfunction; mild  Left carotid stenosis - discussed with neuro, consult placed for vascular surgery.  May need left carotid endarterectomy.   Uncontrolled diabetes mellitus, type 2, insulin requiring with vascular complications - added prandial coverage 5/25, continue basal and supplemental coverage and titrate as needed for better glycemic control.  CBG (last 3)   Recent Labs  07/27/16 0507 07/27/16 0805 07/27/16 1154  GLUCAP 161* 165* 220*   Hypertensive emergency - resolved now, blood pressure much better controlled   Essential hypertension - continue  amlodipine, losartan, metoprolol, increased metoprolol to 25 mg BID 5/26  Microcytic anemia - follow daily CBC, supplement iron.    DVT prophylaxis: enoxaparin Code Status: FULL  Family Communication: bedside  Disposition Plan: SNF when medically stable   Consultants:  Neurology   Vascular surgery  Subjective: Pt had diarrhea yesterday.   Objective: Vitals:   07/26/16 1730 07/26/16 2042 07/27/16 0051 07/27/16 0514  BP: (!) 157/69 (!) 178/85 (!) 162/86 (!) 182/96  Pulse: 88 89 79 99  Resp: 18 20 20 20   Temp: 98.4 F (36.9 C) 98.6 F (37 C) 98.4 F (36.9 C) 98.6 F (37 C)  TempSrc: Oral Oral Oral Oral  SpO2: 100% 98% 98% 99%  Weight:      Height:        Intake/Output Summary (Last 24 hours) at 07/27/16 1205 Last data filed at 07/27/16 0807  Gross per 24 hour  Intake           481.67 ml  Output                0 ml  Net           481.67 ml   Filed Weights   07/21/16 2252 07/22/16 0004  Weight: 72.6 kg (160 lb) 72.6 kg (160 lb)   Review of systems: as per history otherwise all reviewed negative   Exam:  General exam: awake, alert, NAD. Flat affect.  Respiratory system: Clear. No increased work of breathing. Cardiovascular system: S1 & S2 heard, RRR. No JVD, murmurs, gallops, clicks or pedal edema. Gastrointestinal system: Abdomen is nondistended, soft and nontender. Normal bowel sounds heard. Central nervous system: Alert and oriented. No focal neurological deficits. Extremities: no CCE.  Data Reviewed:  Basic Metabolic Panel:  Recent Labs Lab 07/22/16 0346  07/23/16 1020 07/24/16 0651 07/24/16 2032 07/25/16 0438 07/26/16 1035 07/27/16 0318  NA 132*  < > 137 138 141 143 141 143  K 4.1  < > 4.4 5.4* 3.6 3.7 3.9 3.9  CL 99*  < > 106 108 110 111 114* 114*  CO2 20*  < > 21* 19* 21* 18* 20* 18*  GLUCOSE 578*  < > 215* 166* 120* 126* 255* 139*  BUN 23*  < > 31* 34* 25* 28* 33* 31*  CREATININE 1.76*  < > 2.30* 2.09* 2.00* 2.18* 2.04* 1.87*  CALCIUM  8.8*  < > 8.4* 8.4* 8.6* 8.4* 8.5* 8.7*  MG 1.5*  --  2.2  --   --   --   --   --   PHOS 2.9  --  3.5  --   --   --   --   --   < > = values in this interval not displayed. Liver Function Tests:  Recent Labs Lab 07/21/16 2304 07/26/16 1035 07/27/16 0318  AST 33 34 33  ALT 25 23 24   ALKPHOS 80 74 69  BILITOT 0.6 0.2* 0.4  PROT 6.8 6.0* 6.0*  ALBUMIN 3.3* 2.5* 2.6*   No results for input(s): LIPASE, AMYLASE in the last 168 hours.  Recent Labs Lab 07/21/16 2304  AMMONIA 40*   CBC:  Recent Labs Lab 07/21/16 2304 07/21/16 2316 07/22/16 0346 07/23/16 1129 07/26/16 1035 07/27/16 0318  WBC 5.5  --  12.0* 7.9 9.2 7.3  NEUTROABS 3.2  --   --   --  7.1 4.5  HGB 11.8* 12.6* 12.1* 9.0* 10.0* 9.9*  HCT 35.3* 37.0* 35.6* 27.5* 31.2* 30.9*  MCV 79.7  --  78.4 79.7 81.5 81.5  PLT 227  --  180 164 251 241   Cardiac Enzymes:  Recent Labs Lab 07/22/16 0346 07/22/16 0756 07/22/16 1551  TROPONINI 0.06* 0.13* 0.14*   CBG (last 3)   Recent Labs  07/27/16 0507 07/27/16 0805 07/27/16 1154  GLUCAP 161* 165* 220*   Recent Results (from the past 240 hour(s))  Culture, blood (routine x 2)     Status: None   Collection Time: 07/22/16  2:40 AM  Result Value Ref Range Status   Specimen Description BLOOD CENTRAL LINE  Final   Special Requests   Final    BOTTLES DRAWN AEROBIC AND ANAEROBIC Blood Culture adequate volume   Culture NO GROWTH 5 DAYS  Final   Report Status 07/27/2016 FINAL  Final  Culture, blood (routine x 2)     Status: None   Collection Time: 07/22/16  4:00 AM  Result Value Ref Range Status   Specimen Description BLOOD RIGHT HAND  Final   Special Requests IN PEDIATRIC BOTTLE Blood Culture adequate volume  Final   Culture NO GROWTH 5 DAYS  Final   Report Status 07/27/2016 FINAL  Final  MRSA PCR Screening     Status: None   Collection Time: 07/22/16  5:09 AM  Result Value Ref Range Status   MRSA by PCR NEGATIVE NEGATIVE Final    Comment:        The GeneXpert  MRSA Assay (FDA approved for NASAL specimens only), is one component of a comprehensive MRSA colonization surveillance program. It is not intended to diagnose MRSA infection nor to guide or monitor treatment for MRSA infections.   Culture, respiratory (NON-Expectorated)     Status: None   Collection Time: 07/23/16  8:41 AM  Result Value Ref Range Status   Specimen Description TRACHEAL ASPIRATE  Final   Special Requests Normal  Final   Gram Stain   Final    ABUNDANT WBC PRESENT, PREDOMINANTLY PMN ABUNDANT GRAM POSITIVE COCCI IN PAIRS    Culture ABUNDANT STREPTOCOCCUS PNEUMONIAE  Final   Report Status 07/25/2016 FINAL  Final   Organism ID, Bacteria STREPTOCOCCUS PNEUMONIAE  Final      Susceptibility   Streptococcus pneumoniae - MIC*    ERYTHROMYCIN <=0.12 SENSITIVE Sensitive     LEVOFLOXACIN 1 SENSITIVE Sensitive     PENICILLIN (meningitis) <=0.06 SENSITIVE Sensitive     PENICILLIN (non-meningitis) <=0.06 SENSITIVE Sensitive     CEFTRIAXONE (non-meningitis) <=0.12 SENSITIVE Sensitive     CEFTRIAXONE (meningitis) <=0.12 SENSITIVE Sensitive     * ABUNDANT STREPTOCOCCUS PNEUMONIAE  CSF culture     Status: None   Collection Time: 07/23/16  2:52 PM  Result Value Ref Range Status   Specimen Description CSF  Final   Special Requests TUBE 2  Final   Gram Stain   Final    WBC PRESENT,BOTH PMN AND MONONUCLEAR NO ORGANISMS SEEN CYTOSPIN SMEAR    Culture NO GROWTH 3 DAYS  Final   Report Status 07/26/2016 FINAL  Final    Studies: No results found.  Scheduled Meds: . [START ON 07/28/2016] amLODipine  5 mg Oral Daily  . [START ON 07/28/2016] aspirin  325 mg Oral Daily  . atorvastatin  80 mg Oral q1800  . enoxaparin (LOVENOX) injection  40 mg Subcutaneous Q24H  . famotidine  20 mg Oral Daily  . feeding supplement (ENSURE ENLIVE)  237 mL Oral BID BM  . ferrous sulfate  325 mg Oral Q breakfast  . insulin aspart  0-15 Units Subcutaneous TID WC  . insulin aspart  6 Units Subcutaneous  TID WC  . insulin glargine  18 Units Subcutaneous Daily  . losartan  50 mg Oral Daily  . metoprolol tartrate  12.5 mg Oral BID  . sodium bicarbonate  650 mg Oral TID   Continuous Infusions: . sodium chloride      Active Problems:   Encephalopathy acute   Seizure (HCC)   Type 1 diabetes mellitus with ketoacidotic coma (HCC)   Respiratory arrest (HCC)   Gait disturbance, post-stroke   Time spent:   Irwin Brakeman, MD, FAAFP [facility] Pager [phone]  If 7PM-7AM, please contact night-coverage [email or web address] Password [email or web address] 07/27/2016, 12:05 PM    LOS: 5 days

## 2016-07-27 NOTE — Care Management Note (Signed)
Case Management Note  Patient Details  Name: Richard Bennett MRN: [id number] Date of Birth: May 21, 1950  Subjective/Objective:                    Action/Plan:  DC to SNF as facilitated by CSW.  Expected Discharge Date:  07/27/16               Expected Discharge Plan:  Skilled Nursing Facility  In-House Referral:  Clinical Social Work  Discharge planning Services  CM Consult  Post Acute Care Choice:    Choice offered to:     DME Arranged:    DME Agency:     HH Arranged:    [place] Agency:     Status of Service:  Completed, signed off  If discussed at [employer], dates discussed:    Additional Comments:  Carles Collet, RN 07/27/2016, 3:57 PM

## 2016-07-28 LAB — VAS US CAROTID
LEFT ECA DIAS: -21 cm/s
Left CCA dist dias: -73 cm/s
Left CCA dist sys: -347 cm/s
Left CCA prox dias: 30 cm/s
Left CCA prox sys: 108 cm/s
Left ICA dist dias: -35 cm/s
Left ICA dist sys: -120 cm/s
Left ICA prox dias: -52 cm/s
Left ICA prox sys: -285 cm/s
RCCADSYS: -74 cm/s
RCCAPDIAS: 22 cm/s
RIGHT ECA DIAS: -13 cm/s
RIGHT VERTEBRAL DIAS: 23 cm/s
Right CCA prox sys: 101 cm/s

## 2016-07-30 ENCOUNTER — Encounter: Payer: Self-pay | Admitting: Internal Medicine

## 2016-07-30 ENCOUNTER — Non-Acute Institutional Stay (SKILLED_NURSING_FACILITY): Payer: Medicare HMO | Admitting: Internal Medicine

## 2016-07-30 DIAGNOSIS — I69398 Other sequelae of cerebral infarction: Secondary | ICD-10-CM | POA: Diagnosis not present

## 2016-07-30 DIAGNOSIS — IMO0002 Reserved for concepts with insufficient information to code with codable children: Secondary | ICD-10-CM

## 2016-07-30 DIAGNOSIS — E1165 Type 2 diabetes mellitus with hyperglycemia: Secondary | ICD-10-CM

## 2016-07-30 DIAGNOSIS — K580 Irritable bowel syndrome with diarrhea: Secondary | ICD-10-CM

## 2016-07-30 DIAGNOSIS — E1151 Type 2 diabetes mellitus with diabetic peripheral angiopathy without gangrene: Secondary | ICD-10-CM

## 2016-07-30 DIAGNOSIS — N183 Chronic kidney disease, stage 3 unspecified: Secondary | ICD-10-CM

## 2016-07-30 DIAGNOSIS — D638 Anemia in other chronic diseases classified elsewhere: Secondary | ICD-10-CM

## 2016-07-30 DIAGNOSIS — R569 Unspecified convulsions: Secondary | ICD-10-CM

## 2016-07-30 DIAGNOSIS — Z8673 Personal history of transient ischemic attack (TIA), and cerebral infarction without residual deficits: Secondary | ICD-10-CM

## 2016-07-30 DIAGNOSIS — I6522 Occlusion and stenosis of left carotid artery: Secondary | ICD-10-CM | POA: Diagnosis not present

## 2016-07-30 DIAGNOSIS — E782 Mixed hyperlipidemia: Secondary | ICD-10-CM | POA: Diagnosis not present

## 2016-07-30 DIAGNOSIS — R269 Unspecified abnormalities of gait and mobility: Secondary | ICD-10-CM

## 2016-07-30 DIAGNOSIS — M5030 Other cervical disc degeneration, unspecified cervical region: Secondary | ICD-10-CM | POA: Diagnosis not present

## 2016-07-30 DIAGNOSIS — I10 Essential (primary) hypertension: Secondary | ICD-10-CM | POA: Diagnosis not present
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
## 2016-08-01 ENCOUNTER — Encounter: Payer: Self-pay | Admitting: Adult Health

## 2016-08-01 ENCOUNTER — Non-Acute Institutional Stay (SKILLED_NURSING_FACILITY): Payer: Medicare HMO | Admitting: Adult Health

## 2016-08-01 DIAGNOSIS — I10 Essential (primary) hypertension: Secondary | ICD-10-CM | POA: Diagnosis not present

## 2016-08-01 DIAGNOSIS — E1165 Type 2 diabetes mellitus with hyperglycemia: Secondary | ICD-10-CM | POA: Diagnosis not present

## 2016-08-01 DIAGNOSIS — E1151 Type 2 diabetes mellitus with diabetic peripheral angiopathy without gangrene: Secondary | ICD-10-CM

## 2016-08-01 DIAGNOSIS — IMO0002 Reserved for concepts with insufficient information to code with codable children: Secondary | ICD-10-CM

## 2016-08-01 NOTE — Progress Notes (Signed)
Location:   [facility] Room Number: 105 Place of Service:  SNF (31)   CODE STATUS: full code  No Known Allergies  Chief Complaint  Patient presents with  . Acute Visit    blood pressure and diabetes     HPI:  His blood reading this AM is 180/100 with a pulse of 70. His cbg is 406 his only medication is lantus 15 units. He tells me that he feels good and has no complaints.    Past Medical History:  Diagnosis Date  . Chest pain   . Colon polyps    adenomatous  . Hyperlipidemia   . Hypertension   . Refusal of blood transfusions as patient is Jehovah's Witness   . Type II diabetes mellitus ([place])     Past Surgical History:  Procedure Laterality Date  . APPENDECTOMY    . BACK SURGERY    . LUMBAR DISC SURGERY     "herniated"    Social History   Social History  . Marital status: Divorced    Spouse name: N/A  . Number of children: 4  . Years of education: 12   Occupational History  . Truck - Local    Social History Main Topics  . Smoking status: Former Smoker    Packs/day: 0.50    Years: 20.00    Types: Cigarettes    Quit date: 03/04/1978  . Smokeless tobacco: Never Used  . Alcohol use Yes     Comment: 10/30/2015 "might have 2-3 drinks/year"  . Drug use: Yes    Types: Heroin     Comment: 10/30/2015 "nothing in the 2000s"  . Sexual activity: No   Other Topics Concern  . Not on file   Social History Narrative   Fun: Cycling    Family History  Problem Relation Age of Onset  . Diabetes Mother   . Cancer Father   . Colon cancer Neg Hx   . Esophageal cancer Neg Hx   . Stomach cancer Neg Hx   . Rectal cancer Neg Hx       VITAL SIGNS BP (!) 180/100   Pulse 70   Resp 18   Ht 5\' 9"  (1.753 m)   Wt 160 lb (72.6 kg)   BMI 23.63 kg/m    Patient's Medications  New Prescriptions   No medications on file  Previous Medications   AMLODIPINE (NORVASC) 10 MG TABLET    Take 10 mg by mouth daily.   ASPIRIN 325 MG TABLET    Take 1 tablet  (325 mg total) by mouth daily.   ATORVASTATIN (LIPITOR) 80 MG TABLET    Take 1 tablet (80 mg total) by mouth daily at 6 PM.   ELUXADOLINE (VIBERZI) 100 MG TABS    Take 100 mg by mouth 2 (two) times daily.   FEEDING SUPPLEMENT, ENSURE ENLIVE, (ENSURE ENLIVE) LIQD    Take 237 mLs by mouth 2 (two) times daily between meals.   FERROUS SULFATE 325 (65 FE) MG TABLET    Take 1 tablet (325 mg total) by mouth daily with breakfast.   HYDROCHLOROTHIAZIDE (MICROZIDE) 12.5 MG CAPSULE    Take 12.5 mg by mouth daily.   LANTUS SOLOSTAR 100 UNIT/ML SOLOSTAR PEN    Inject 15 Units into the skin every morning.   LOSARTAN (COZAAR) 50 MG TABLET    Take 1 tablet (50 mg total) by mouth daily.   METOPROLOL TARTRATE (LOPRESSOR) 25 MG TABLET    Take 1 tablet (  25 mg total) by mouth 2 (two) times daily.   SODIUM BICARBONATE 650 MG TABLET    Take 1 tablet (650 mg total) by mouth 2 (two) times daily.  Modified Medications   No medications on file  Discontinued Medications   AMLODIPINE (NORVASC) 5 MG TABLET    Take 5 mg by mouth daily.     SIGNIFICANT DIAGNOSTIC EXAMS  07-21-16: ct of head and cervical spine: 1. Chronic microvascular ischemia without acute intracranial abnormality. 2. No acute fracture or static subluxation of the cervical spine. 3. Lower cervical degenerative disc disease with moderate to severe bilateral foraminal stenosis at C6-C7.  07-22-16: mri brain: Unusual imaging pattern consistent with late acute to subacute ischemia, affecting the posterior frontal cortex and subcortical white matter; watershed pattern of infarction is favored. See discussion above. No structural abnormality is seen which might contribute to epileptiform activity. No features suggestive of PRES. No abnormal postcontrast enhancement. Extensive atrophy and small vessel disease.  07-22-16: EEG: Impression: This EEG is markedly abnormal due to diffuse background suppression and lack of EEG reactivity with noxious stimulation.   Clinical Correlation of the above findings indicates severe diffuse cerebral dysfunction that is likely due to propofol sedation, but can also be seen in the setting of anoxic/ischemic injury or toxic/metabolic encephalopathies.  Clinical correlation is advised.  No electrographic seizures are seen  07-25-16: 2-d echo: - Normal LV systolic function; mild diastolic dysfunction; mild LVH; trace MR and TR. EF 60-65%   LABS REVIEWED:   07-21-16: wbc 5.5; hgb 11.8; hct 35.3; mcv 79.7; plt 227; glucose 643; bun 22; creat 1.97; k+ 4.1; na++ 130; ca 9.0 ;liver normal albumin 3.3 ammonia 40 07-22-16; wbc 12.0; hgb 12.1; hct 35.6; mcv 78.4; plt 180; glucose 578; bun 23; creat 1.76; k+ 4.1 ;na++ 132; ca 8.8; mag 1.5 phos 2.9 07-25-16: glucose 126; bun 28; creat 2.18; k+ 3.7; na++ 143; ca 8.4; chol 173; ldl 98; trig 208; hdl 33; hgb a1c 11.9    Review of Systems  Constitutional: Negative for malaise/fatigue.  Respiratory: Negative for cough and shortness of breath.   Cardiovascular: Negative for chest pain, palpitations and leg swelling.  Gastrointestinal: Negative for abdominal pain, constipation and heartburn.  Musculoskeletal: Negative for back pain, joint pain and myalgias.  Skin: Negative.   Neurological: Negative for dizziness.  Psychiatric/Behavioral: The patient is not nervous/anxious.     Physical Exam  Constitutional: He is oriented to person, place, and time. No distress.  Eyes: Conjunctivae are normal.  Neck: Neck supple. No JVD present. No thyromegaly present.  + left carotid bruit   Cardiovascular: Normal rate, regular rhythm and intact distal pulses.   Murmur heard. Respiratory: Effort normal and breath sounds normal. No respiratory distress. He has no wheezes.  GI: Soft. Bowel sounds are normal. He exhibits no distension. There is no tenderness.  Musculoskeletal: He exhibits no edema.  Able to move all extremities   Lymphadenopathy:    He has no cervical adenopathy.  Neurological:  He is alert and oriented to person, place, and time.  Skin: Skin is warm and dry. He is not diaphoretic.  Psychiatric: He has a normal mood and affect.      ASSESSMENT/ PLAN:  1. Hypertension: b/p 180/100; will continue norvasc 10 mg daily hctz 12.5 mg daily cozaar 50 mg daily will begin clonidine 0.1 mg twice daily will continue to monitor his status.   2. Diabetes: hgb a1c 11.9; will increase his lantus to 20 units nightly and will begin  novolog 10 units after meals.   Will have staff monitor b/p every 8 hours and cbg three times daily    MD is aware of resident's narcotic use and is in agreement with current plan of care. We will attempt to wean resident as apropriate   Ok Edwards NP [facility]  Contact [phone] Monday through Friday 8am- 5pm  After hours call [phone]

## 2016-08-05 ENCOUNTER — Encounter: Payer: Self-pay | Admitting: Adult Health

## 2016-08-05 ENCOUNTER — Non-Acute Institutional Stay (SKILLED_NURSING_FACILITY): Payer: Medicare HMO | Admitting: Adult Health

## 2016-08-05 ENCOUNTER — Other Ambulatory Visit: Payer: Self-pay

## 2016-08-05 DIAGNOSIS — E1165 Type 2 diabetes mellitus with hyperglycemia: Secondary | ICD-10-CM

## 2016-08-05 DIAGNOSIS — R569 Unspecified convulsions: Secondary | ICD-10-CM | POA: Diagnosis not present

## 2016-08-05 DIAGNOSIS — R269 Unspecified abnormalities of gait and mobility: Secondary | ICD-10-CM

## 2016-08-05 DIAGNOSIS — I161 Hypertensive emergency: Secondary | ICD-10-CM

## 2016-08-05 DIAGNOSIS — E1151 Type 2 diabetes mellitus with diabetic peripheral angiopathy without gangrene: Secondary | ICD-10-CM | POA: Diagnosis not present

## 2016-08-05 DIAGNOSIS — I69398 Other sequelae of cerebral infarction: Secondary | ICD-10-CM

## 2016-08-05 DIAGNOSIS — I6523 Occlusion and stenosis of bilateral carotid arteries: Secondary | ICD-10-CM

## 2016-08-05 DIAGNOSIS — IMO0002 Reserved for concepts with insufficient information to code with codable children: Secondary | ICD-10-CM

## 2016-08-05 NOTE — Progress Notes (Signed)
Location:   [facility] Room Number: 105 Place of Service:  SNF (31)    CODE STATUS: Full Code  No Known Allergies  Chief Complaint  Patient presents with  . Discharge Note    Discharging to home    HPI:  He is being discharged to home with home health for pt/ot/st/rn. He will not need dme. He will need his prescriptions to be written and will need to follow up with his medical provider.  He had been hospitalized after a seizure and had a prolonged postictal recovery.    Past Medical History:  Diagnosis Date  . Chest pain   . Colon polyps    adenomatous  . Hyperlipidemia   . Hypertension   . Refusal of blood transfusions as patient is Jehovah's Witness   . Type II diabetes mellitus ([place])     Past Surgical History:  Procedure Laterality Date  . APPENDECTOMY    . BACK SURGERY    . LUMBAR DISC SURGERY     "herniated"    Social History   Social History  . Marital status: Divorced    Spouse name: N/A  . Number of children: 4  . Years of education: 12   Occupational History  . Truck - Local    Social History Main Topics  . Smoking status: Former Smoker    Packs/day: 0.50    Years: 20.00    Types: Cigarettes    Quit date: 03/04/1978  . Smokeless tobacco: Never Used  . Alcohol use Yes     Comment: 10/30/2015 "might have 2-3 drinks/year"  . Drug use: Yes    Types: Heroin     Comment: 10/30/2015 "nothing in the 2000s"  . Sexual activity: No   Other Topics Concern  . Not on file   Social History Narrative   Fun: Cycling    Family History  Problem Relation Age of Onset  . Diabetes Mother   . Cancer Father   . Colon cancer Neg Hx   . Esophageal cancer Neg Hx   . Stomach cancer Neg Hx   . Rectal cancer Neg Hx     VITAL SIGNS BP (!) 138/99   Pulse 62   Temp 97.8 F (36.6 C)   Resp 18   Ht 5\' 9"  (1.753 m)   Wt 160 lb (72.6 kg)   SpO2 97%   BMI 23.63 kg/m   Patient's Medications  New Prescriptions   No medications on file    Previous Medications   AMLODIPINE (NORVASC) 10 MG TABLET    Take 10 mg by mouth daily.   ASPIRIN 325 MG TABLET    Take 1 tablet (325 mg total) by mouth daily.   ATORVASTATIN (LIPITOR) 80 MG TABLET    Take 1 tablet (80 mg total) by mouth daily at 6 PM.   CLONIDINE (CATAPRES) 0.1 MG TABLET    Take 0.1 mg by mouth 2 (two) times daily.   ELUXADOLINE (VIBERZI) 100 MG TABS    Take 100 mg by mouth 2 (two) times daily.   FERROUS SULFATE 325 (65 FE) MG TABLET    Take 1 tablet (325 mg total) by mouth daily with breakfast.   HYDROCHLOROTHIAZIDE (MICROZIDE) 12.5 MG CAPSULE    Take 12.5 mg by mouth daily.   LANTUS SOLOSTAR 100 UNIT/ML SOLOSTAR PEN    Inject 15 Units into the skin every morning.   LOSARTAN (COZAAR) 50 MG TABLET    Take 1 tablet (50 mg total) by  mouth daily.   METOPROLOL TARTRATE (LOPRESSOR) 25 MG TABLET    Take 1 tablet (25 mg total) by mouth 2 (two) times daily.   SODIUM BICARBONATE 650 MG TABLET    Take 1 tablet (650 mg total) by mouth 2 (two) times daily.  Modified Medications   No medications on file  Discontinued Medications   FEEDING SUPPLEMENT, ENSURE ENLIVE, (ENSURE ENLIVE) LIQD    Take 237 mLs by mouth 2 (two) times daily between meals.     SIGNIFICANT DIAGNOSTIC EXAMS  07-21-16: ct of head and cervical spine: 1. Chronic microvascular ischemia without acute intracranial abnormality. 2. No acute fracture or static subluxation of the cervical spine. 3. Lower cervical degenerative disc disease with moderate to severe bilateral foraminal stenosis at C6-C7.  07-22-16: mri brain: Unusual imaging pattern consistent with late acute to subacute ischemia, affecting the posterior frontal cortex and subcortical white matter; watershed pattern of infarction is favored. See discussion above. No structural abnormality is seen which might contribute to epileptiform activity. No features suggestive of PRES. No abnormal postcontrast enhancement. Extensive atrophy and small vessel  disease.  07-22-16: EEG: Impression: This EEG is markedly abnormal due to diffuse background suppression and lack of EEG reactivity with noxious stimulation.  Clinical Correlation of the above findings indicates severe diffuse cerebral dysfunction that is likely due to propofol sedation, but can also be seen in the setting of anoxic/ischemic injury or toxic/metabolic encephalopathies.  Clinical correlation is advised.  No electrographic seizures are seen  07-25-16: 2-d echo: - Normal LV systolic function; mild diastolic dysfunction; mild LVH; trace MR and TR. EF 60-65%   LABS REVIEWED:   07-21-16: wbc 5.5; hgb 11.8; hct 35.3; mcv 79.7; plt 227; glucose 643; bun 22; creat 1.97; k+ 4.1; na++ 130; ca 9.0 ;liver normal albumin 3.3 ammonia 40 07-22-16; wbc 12.0; hgb 12.1; hct 35.6; mcv 78.4; plt 180; glucose 578; bun 23; creat 1.76; k+ 4.1 ;na++ 132; ca 8.8; mag 1.5 phos 2.9 07-25-16: glucose 126; bun 28; creat 2.18; k+ 3.7; na++ 143; ca 8.4; chol 173; ldl 98; trig 208; hdl 33; hgb a1c 11.9    Review of Systems  Constitutional: Negative for malaise/fatigue.  Respiratory: Negative for cough and shortness of breath.   Cardiovascular: Negative for chest pain, palpitations and leg swelling.  Gastrointestinal: Negative for abdominal pain, constipation and heartburn.  Musculoskeletal: Negative for back pain, joint pain and myalgias.  Skin: Negative.   Neurological: Negative for dizziness.  Psychiatric/Behavioral: The patient is not nervous/anxious.     Physical Exam  Constitutional: He is oriented to person, place, and time. No distress.  Eyes: Conjunctivae are normal.  Neck: Neck supple. No JVD present. No thyromegaly present.  + left carotid bruit   Cardiovascular: Normal rate, regular rhythm and intact distal pulses.   Murmur heard. Respiratory: Effort normal and breath sounds normal. No respiratory distress. He has no wheezes.  GI: Soft. Bowel sounds are normal. He exhibits no distension.  There is no tenderness.  Musculoskeletal: He exhibits no edema.  Able to move all extremities   Lymphadenopathy:    He has no cervical adenopathy.  Neurological: He is alert and oriented to person, place, and time.  Skin: Skin is warm and dry. He is not diaphoretic.  Psychiatric: He has a normal mood and affect.      ASSESSMENT/ PLAN:  Patient is being discharged with the following home health services:  Pt/ot/st/rn to evaluate and treat as indicated for gait balance strength adl training cognition therapy  and medication management   Patient is being discharged with the following durable medical equipment:  None required.   Patient has been advised to f/u with their PCP in 1-2 weeks to bring them up to date on their rehab stay.  Social services at facility was responsible for arranging this appointment.  Pt was provided with a 30 day supply of prescriptions for medications and refills must be obtained from their PCP.  For controlled substances, a more limited supply may be provided adequate until PCP appointment only.   Time spent with patient  40   minutes >50% time spent counseling; reviewing medical record; tests; labs; and developing future plan of care   Ok Edwards NP [facility]  Contact [phone] Monday through Friday 8am- 5pm  After hours call [phone]

## 2016-08-13 DIAGNOSIS — Z8673 Personal history of transient ischemic attack (TIA), and cerebral infarction without residual deficits: Secondary | ICD-10-CM | POA: Insufficient documentation

## 2016-08-13 DIAGNOSIS — N183 Chronic kidney disease, stage 3 unspecified: Secondary | ICD-10-CM | POA: Insufficient documentation

## 2016-08-13 DIAGNOSIS — K580 Irritable bowel syndrome with diarrhea: Secondary | ICD-10-CM | POA: Insufficient documentation

## 2016-08-13 DIAGNOSIS — D638 Anemia in other chronic diseases classified elsewhere: Secondary | ICD-10-CM | POA: Insufficient documentation

## 2016-08-14 ENCOUNTER — Encounter: Payer: Self-pay | Admitting: Vascular Surgery

## 2016-08-27 ENCOUNTER — Ambulatory Visit (INDEPENDENT_AMBULATORY_CARE_PROVIDER_SITE_OTHER): Payer: Medicare HMO | Admitting: Vascular Surgery

## 2016-08-27 ENCOUNTER — Encounter: Payer: Self-pay | Admitting: Vascular Surgery

## 2016-08-27 ENCOUNTER — Ambulatory Visit (HOSPITAL_COMMUNITY)
Admission: RE | Admit: 2016-08-27 | Discharge: 2016-08-27 | Disposition: A | Discharge status: Home / Self Care | Payer: Medicare HMO | Source: Ambulatory Visit | Attending: Vascular Surgery | Admitting: Vascular Surgery

## 2016-08-27 VITALS — BP 223/127 | HR 96 | Temp 97.2°F | Resp 16 | Ht 69.0 in | Wt 155.0 lb

## 2016-08-27 DIAGNOSIS — I6523 Occlusion and stenosis of bilateral carotid arteries: Secondary | ICD-10-CM

## 2016-08-27 DIAGNOSIS — I6522 Occlusion and stenosis of left carotid artery: Secondary | ICD-10-CM | POA: Diagnosis not present

## 2016-08-27 LAB — VAS US CAROTID
LCCADSYS: -238 cm/s
LEFT ECA DIAS: -21 cm/s
LICADDIAS: -36 cm/s
LICADSYS: -117 cm/s
LICAPSYS: 234 cm/s
Left CCA dist dias: -33 cm/s
Left CCA prox dias: 17 cm/s
Left CCA prox sys: 91 cm/s
Left ICA prox dias: 33 cm/s

## 2016-08-27 NOTE — Progress Notes (Signed)
HISTORY AND PHYSICAL     CC:  Follow up Requesting Provider:  [name], MD  HPI: This is a 66 y.o. male with hx of DM, hypertension who was found down in a parking lot having a seizure.  He was brought in by EMS.   He had a long post ictal phase and was unresponsive.  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  He did have a carotid duplex, which revealed the left proximal internal carotid artery exhibits elevated peak systolic velocities suggestive of low range 80-99% stenosis, and elevated end diastolic velocities suggestive of 40-59% stenosis. This may be due to distal common carotid artery stenosis, however internal carotid artery stenosis cannot be excluded.  The left distal CCA exhibits elevated velocities of >50% stenosis.  The right ICA had a 1-39% stenosis.  The left vertebral artery exhibits an atypical waveform.  This finding, along with the elevated left subclavian artery velocity is suggestive of proximal stenosis.  At that time, Dr. Donnetta Hutching was consulted.  Reccommended to f/u as outpatient.  The pt presents today for further discussion of endarterectomy.    He states that he does not feel bad today.  Denies headache.    He is a former smoker.  He is on a CCB, beta blocker, diuretic, clonidine, and an ARB for hypertension, which is uncontrolled.  The pt is on a statin for cholesterol management.  He takes a daily aspirin.  He is on insulin for diabetes.     Past Medical History:  Diagnosis Date  . Chest pain   . Colon polyps    adenomatous  . Hyperlipidemia   . Hypertension   . Refusal of blood transfusions as patient is Jehovah's Witness   . Type II diabetes mellitus ([place])     Past Surgical History:  Procedure Laterality Date  . APPENDECTOMY    . BACK SURGERY    . LUMBAR DISC SURGERY     "herniated"    No Known Allergies  Current Outpatient Prescriptions  Medication  Sig Dispense Refill  . amLODipine (NORVASC) 10 MG tablet Take 10 mg by mouth daily.    Marland Kitchen aspirin 325 MG tablet Take 1 tablet (325 mg total) by mouth daily.    Marland Kitchen atorvastatin (LIPITOR) 80 MG tablet Take 1 tablet (80 mg total) by mouth daily at 6 PM.    . cloNIDine (CATAPRES) 0.1 MG tablet Take 0.1 mg by mouth 2 (two) times daily.    . Eluxadoline (VIBERZI) 100 MG TABS Take 100 mg by mouth 2 (two) times daily.    . ferrous sulfate 325 (65 FE) MG tablet Take 1 tablet (325 mg total) by mouth daily with breakfast.  3  . hydrochlorothiazide (MICROZIDE) 12.5 MG capsule Take 12.5 mg by mouth daily.    Marland Kitchen LANTUS SOLOSTAR 100 UNIT/ML Solostar Pen Inject 15 Units into the skin every morning. 15 mL 3  . losartan (COZAAR) 50 MG tablet Take 1 tablet (50 mg total) by mouth daily. 30 tablet 0  . metoprolol tartrate (LOPRESSOR) 25 MG tablet Take 1 tablet (25 mg total) by mouth 2 (two) times daily. 60 tablet 0  . sodium bicarbonate 650 MG tablet Take 1 tablet (650 mg total) by mouth 2 (two) times daily.     No current facility-administered medications for this visit.     Family History  Problem Relation Age of Onset  . Diabetes Mother   .  Cancer Father   . Colon cancer Neg Hx   . Esophageal cancer Neg Hx   . Stomach cancer Neg Hx   . Rectal cancer Neg Hx     Social History   Social History  . Marital status: Divorced    Spouse name: N/A  . Number of children: 4  . Years of education: 12   Occupational History  . Truck - Local    Social History Main Topics  . Smoking status: Former Smoker    Packs/day: 0.50    Years: 20.00    Types: Cigarettes    Quit date: 03/04/1978  . Smokeless tobacco: Never Used  . Alcohol use Yes     Comment: 10/30/2015 "might have 2-3 drinks/year"  . Drug use: Yes    Types: Heroin     Comment: 10/30/2015 "nothing in the 2000s"  . Sexual activity: No   Other Topics Concern  . Not on file   Social History Narrative   Fun: Cycling      REVIEW OF SYSTEMS:    [X]  denotes positive finding, [ ]  denotes negative finding Cardiac  Comments:  Chest pain or chest pressure:    Shortness of breath upon exertion:    Short of breath when lying flat:    Irregular heart rhythm:        Vascular    Pain in calf, thigh, or hip brought on by ambulation:    Pain in feet at night that wakes you up from your sleep:     Blood clot in your veins:    Leg swelling:  x       Pulmonary    Oxygen at home:    Productive cough:     Wheezing:         Neurologic    Sudden weakness in arms or legs:     Sudden numbness in arms or legs:     Sudden onset of difficulty speaking or slurred speech:    Temporary loss of vision in one eye:     Problems with dizziness:  x       Gastrointestinal    Blood in stool:     Vomited blood:         Genitourinary    Burning when urinating:     Blood in urine:        Psychiatric    Major depression:         Hematologic    Bleeding problems:    Problems with blood clotting too easily:        Skin    Rashes or ulcers:        Constitutional    Fever or chills:      PHYSICAL EXAMINATION:  Vitals:   08/27/16 1530 08/27/16 1534  BP: (!) 241/129 (!) 223/127  Pulse: 96   Resp: 16   Temp: 97.2 F (36.2 C)    Body mass index is 22.89 kg/m.  General:  WDWN in NAD; vital signs documented above Gait: Normal HENT: WNL, normocephalic Pulmonary: normal non-labored breathing , without Rales, rhonchi,  wheezing Cardiac: regular HR, without  Murmurs, rubs or gallops; without carotid bruits Abdomen: soft, NT, no masses Skin: without rashes Vascular Exam/Pulses:  Right Left  Radial 2+ (normal) 2+ (normal)   Extremities: without ischemic changes, without Gangrene , without cellulitis; without open wounds;  Musculoskeletal: no muscle wasting or atrophy  Neurologic: A&O X 3;  No focal weakness or paresthesias are detected Psychiatric:  The pt  has Normal affect.   Non-Invasive Vascular Imaging:   Left Carotid Duplex  on 08/27/16: -Thrombus in the mid and distal CCA -Altered hemodynamics in the ICA due to proximal thrombus.  Unable to determine degree of ICA stenosis -left vertebral velocity at the origin is 176cm/sec and the waveform is atypical. -left subclavian velocity is 441cm/sec  Carotid duplex 07/26/16: Findings suggest 1-39% right internal carotid artery stenosis.   The left distal common carotid artery exhibits elevated velocities suggestive of >50% stenosis.   The left proximal internal carotid artery exhibits elevated peak systolic velocities suggestive of low range 80-99% stenosis, and elevated end diastolic velocities suggestive of 40-59% stenosis. This may be due to distal common carotid artery stenosis, however internal carotid artery stenosis cannot be excluded.  The left vertebral artery exhibits an atypical waveform. This finding, along with the elevated left subclavian artery velocity is suggestive of proximal stenosis.  Pt meds includes: Statin:  Yes.   Beta Blocker:  Yes.   Aspirin:  Yes.   ACEI:  No. ARB:  Yes.   CCB use:  Yes Other Antiplatelet/Anticoagulant:  No   ASSESSMENT/PLAN:: 66 y.o. male with high grade left carotid artery stenosis pt has recovered from event last month and presents today for discussion of carotid endarterectomy-  -pt will benefit from left carotid endarterectomy but will need a CTA of the neck to further determine anatomy since his bifurcation is high on duplex.  At that time, Dr. Donnetta Hutching will call pt and discuss results. -pt has uncontrolled hypertension today and is asymptomatic.  He has an appointment with his PCP tomorrow.  His BP will need to be under better control before undergoing surgery.     Leontine Locket, PA-C [facility] [phone]  Clinic MD:  Pt seen and examined with Dr. Donnetta Hutching  I have examined the patient, reviewed and agree with above. No focal neurologic deficits. Duplex today today shows thrombus  throughout his common carotid artery on the left with a high bifurcation. Will need CTA to determine if endarterectomy would be safe. Discussed at length with the patient. Also extreme hypertension. In looking back at his notes from the nursing facility typically ran in the 180/100 range. He is to see his medical doctor tomorrow to discuss this. Explained he will have to have better blood pressure control prior to proceeding with carotid endarterectomy  Curt Jews, MD 08/27/2016 5:01 PM

## 2016-08-28 NOTE — Addendum Note (Signed)
Addended by: Lianne Cure A on: 08/28/2016 01:34 PM   Modules accepted: Orders

## 2016-08-29 ENCOUNTER — Telehealth: Payer: Self-pay | Admitting: Vascular Surgery

## 2016-08-29 NOTE — Telephone Encounter (Signed)
Sorry error-duplicate telephone encounter/awt

## 2016-08-29 NOTE — Telephone Encounter (Signed)
Per Dr.Early's instructions on 08/27/16 I scheduled an appointment for the patient to have a CTA of Neck on Tuesday 09/03/16 at 2:30pm. He is to arrive at 2:10pm. No solid foods 4 hours prior but medications and liquids are fine. [place] Dr.Early is to call the patient with results of CTA. I left a detailed VM for patient with the above information. awt

## 2016-09-03 ENCOUNTER — Inpatient Hospital Stay: Admission: RE | Admit: 2016-09-03 | Payer: Medicare HMO | Source: Ambulatory Visit

## 2016-09-06 ENCOUNTER — Other Ambulatory Visit: Payer: Self-pay | Admitting: Adult Health

## 2016-09-10 ENCOUNTER — Ambulatory Visit
Admission: RE | Admit: 2016-09-10 | Discharge: 2016-09-10 | Disposition: A | Discharge status: Home / Self Care | Payer: Medicare HMO | Source: Ambulatory Visit | Attending: Vascular Surgery | Admitting: Vascular Surgery

## 2016-09-10 ENCOUNTER — Inpatient Hospital Stay: Admission: RE | Admit: 2016-09-10 | Payer: Medicare HMO | Source: Ambulatory Visit

## 2016-09-10 DIAGNOSIS — I6522 Occlusion and stenosis of left carotid artery: Secondary | ICD-10-CM

## 2016-09-10 MED ORDER — IOPAMIDOL (ISOVUE-370) INJECTION 76%
60.0000 mL | Freq: Once | INTRAVENOUS | Status: AC | PRN
  Administered 2016-09-10: 60 mL via INTRAVENOUS

## 2016-09-12 ENCOUNTER — Telehealth: Payer: Self-pay | Admitting: Vascular Surgery

## 2016-09-12 NOTE — Telephone Encounter (Signed)
Richard Bennett the patient's recent CT angiogram of his neck by telephone the CT angiogram was on 09/10/2016. This showed moderate diffuse narrowing throughout the common carotid artery. This was very smooth. There was no evidence of any narrowing at the bifurcation or internal carotid artery. Explained that this is somewhat unusual pattern but do not feel that there is any indication for surgical treatment of this. He does have follow-up with neurology next week,.  He will see [place] in the office in 6 months with repeat carotid duplex.

## 2016-09-16 NOTE — Addendum Note (Signed)
Addended by: Lianne Cure A on: 09/16/2016 10:41 AM   Modules accepted: Orders

## 2016-09-18 ENCOUNTER — Encounter: Payer: Self-pay | Admitting: Diagnostic Neuroimaging

## 2016-09-18 ENCOUNTER — Ambulatory Visit (INDEPENDENT_AMBULATORY_CARE_PROVIDER_SITE_OTHER): Payer: Medicare HMO | Admitting: Diagnostic Neuroimaging

## 2016-09-18 VITALS — BP 185/93 | HR 80 | Ht 68.0 in | Wt 169.2 lb

## 2016-09-18 DIAGNOSIS — R569 Unspecified convulsions: Secondary | ICD-10-CM

## 2016-09-18 DIAGNOSIS — I638 Other cerebral infarction: Secondary | ICD-10-CM | POA: Diagnosis not present

## 2016-09-18 DIAGNOSIS — E1165 Type 2 diabetes mellitus with hyperglycemia: Secondary | ICD-10-CM | POA: Diagnosis not present

## 2016-09-18 DIAGNOSIS — IMO0002 Reserved for concepts with insufficient information to code with codable children: Secondary | ICD-10-CM

## 2016-09-18 DIAGNOSIS — E1151 Type 2 diabetes mellitus with diabetic peripheral angiopathy without gangrene: Secondary | ICD-10-CM

## 2016-09-18 DIAGNOSIS — I6389 Other cerebral infarction: Secondary | ICD-10-CM

## 2016-09-18 NOTE — Progress Notes (Addendum)
[facility]  PATIENT: Richard Bennett DOB: 01-19-1951  REFERRING CLINICIAN: Wynetta Emery, [place] HISTORY FROM: patient and brother  REASON FOR VISIT: new consult    HISTORICAL  CHIEF COMPLAINT:  Chief Complaint  Patient presents with  . NP  Wynetta Emery  . Seizures    Seen in ED for sz (new onset).  No sz since.  No anti sz meds.   Hx stroke.     HISTORY OF PRESENT ILLNESS:   66 year old male with hypertension, diabetes, hypercholesterolemia, here for evaluation of stroke and seizure.  07/21/16 patient was at home, outside, when he collapsed and had a seizure. This was witnessed by neighbors who called 911. Patient was taken to the hospital via EMS. Patient's blood glucose was 619. Patient had another seizure en route to the ER. Patient was intubated in the emergency room and admitted to ICU. Patient also had significant hypertension initially. MRI confirmed left greater than right-sided watershed ischemic infarctions, but unusual pattern. Patient was initially treated with Keppra but this was then discontinued. Patient was found to have left common carotid artery stenosis, which was then followed up on outpatient basis. Carotid ultrasound in outpatient basis raise possibility of thrombus, but follow-up CT angiogram of neck showed long segment smooth narrowing. Vascular surgery recommended medical management.  Otherwise since discharge patient has been stable. No further seizure or stroke symptoms.  Patient tolerating medications.   REVIEW OF SYSTEMS: Full 14 system review of systems performed and negative with exception of: only as per HPI.  ALLERGIES: No Known Allergies  HOME MEDICATIONS: Outpatient Medications Prior to Visit  Medication Sig Dispense Refill  . amLODipine (NORVASC) 10 MG tablet Take 10 mg by mouth daily.    Marland Kitchen aspirin 325 MG tablet Take 1 tablet (325 mg total) by mouth daily.    Marland Kitchen atorvastatin (LIPITOR) 80 MG tablet Take 1 tablet (80 mg total) by  mouth daily at 6 PM.    . cloNIDine (CATAPRES) 0.1 MG tablet Take 0.1 mg by mouth 2 (two) times daily.    . Eluxadoline (VIBERZI) 100 MG TABS Take 100 mg by mouth 2 (two) times daily.    . ferrous sulfate 325 (65 FE) MG tablet Take 1 tablet (325 mg total) by mouth daily with breakfast.  3  . hydrochlorothiazide (MICROZIDE) 12.5 MG capsule Take 12.5 mg by mouth daily.    Marland Kitchen LANTUS SOLOSTAR 100 UNIT/ML Solostar Pen Inject 15 Units into the skin every morning. 15 mL 3  . losartan (COZAAR) 50 MG tablet Take 1 tablet (50 mg total) by mouth daily. 30 tablet 0  . metoprolol tartrate (LOPRESSOR) 25 MG tablet Take 1 tablet (25 mg total) by mouth 2 (two) times daily. 60 tablet 0  . sodium bicarbonate 650 MG tablet Take 1 tablet (650 mg total) by mouth 2 (two) times daily.     No facility-administered medications prior to visit.     PAST MEDICAL HISTORY: Past Medical History:  Diagnosis Date  . Chest pain   . Colon polyps    adenomatous  . Hyperlipidemia   . Hypertension   . Refusal of blood transfusions as patient is Jehovah's Witness   . Stroke ([place])   . Type II diabetes mellitus ([place])     PAST SURGICAL HISTORY: Past Surgical History:  Procedure Laterality Date  . APPENDECTOMY    . BACK SURGERY    . LUMBAR DISC SURGERY     "herniated"    FAMILY HISTORY: Family History  Problem Relation Age of  Onset  . Diabetes Mother   . Stroke Mother   . Cancer Father   . Diabetes Brother   . Seizures Brother   . Colon cancer Neg Hx   . Esophageal cancer Neg Hx   . Stomach cancer Neg Hx   . Rectal cancer Neg Hx     SOCIAL HISTORY:  Social History   Social History  . Marital status: Divorced    Spouse name: N/A  . Number of children: 4  . Years of education: 12   Occupational History  . Truck - Local    Social History Main Topics  . Smoking status: Former Smoker    Packs/day: 0.50    Years: 20.00    Types: Cigarettes    Quit date: 03/04/1978  . Smokeless tobacco: Never Used  .  Alcohol use Yes     Comment: 10/30/2015 "might have 2-3 drinks/year"  . Drug use: Yes    Types: Heroin     Comment: 10/30/2015 "nothing in the 2000s"  . Sexual activity: No   Other Topics Concern  . Not on file   Social History Narrative   Fun: Cycling    Lives alone.  Is retired. Education 12 th grade.  Single.  Children - 4.  He is Jehovah witness.      PHYSICAL EXAM  GENERAL EXAM/CONSTITUTIONAL: Vitals:  Vitals:   09/18/16 1053  BP: (!) 185/93  Pulse: 80  Weight: 169 lb 3.2 oz (76.7 kg)  Height: 5\' 8"  (1.727 m)     Body mass index is 25.73 kg/m.  Visual Acuity Screening   Right eye Left eye Both eyes  Without correction: 20/40 20/70   With correction:        Patient is in no distress; well developed, nourished and groomed; neck is supple  CARDIOVASCULAR:  Examination of carotid arteries is normal; no carotid bruits  Regular rate and rhythm, no murmurs  Examination of peripheral vascular system by observation and palpation is normal  EYES:  Ophthalmoscopic exam of optic discs and posterior segments is normal; no papilledema or hemorrhages  MUSCULOSKELETAL:  Gait, strength, tone, movements noted in Neurologic exam below  NEUROLOGIC: MENTAL STATUS:  No flowsheet data found.  awake, alert, oriented to person, place and time  recent and remote memory intact  normal attention and concentration  language fluent, comprehension intact, naming intact,   fund of knowledge appropriate  CRANIAL NERVE:   2nd - no papilledema on fundoscopic exam  2nd, 3rd, 4th, 6th - pupils equal and reactive to light, visual fields full to confrontation, extraocular muscles intact, no nystagmus  5th - facial sensation symmetric  7th - facial strength symmetric  8th - hearing intact  9th - palate elevates symmetrically, uvula midline  11th - shoulder shrug symmetric  12th - tongue protrusion midline  MOTOR:   normal bulk and tone, full strength in the BUE,  BLE  SENSORY:   normal and symmetric to light touch, temperature, vibration  COORDINATION:   finger-nose-finger, fine finger movements normal  REFLEXES:   deep tendon reflexes TRACE and symmetric  GAIT/STATION:   narrow based gait    DIAGNOSTIC DATA (LABS, IMAGING, TESTING) - I reviewed patient records, labs, notes, testing and imaging myself where available.  Lab Results  Component Value Date   WBC 7.3 07/27/2016   HGB 9.9 (L) 07/27/2016   HCT 30.9 (L) 07/27/2016   MCV 81.5 07/27/2016   PLT 241 07/27/2016      Component Value Date/Time  NA 143 07/27/2016 0318   K 3.9 07/27/2016 0318   CL 114 (H) 07/27/2016 0318   CO2 18 (L) 07/27/2016 0318   GLUCOSE 139 (H) 07/27/2016 0318   BUN 31 (H) 07/27/2016 0318   CREATININE 1.87 (H) 07/27/2016 0318   CALCIUM 8.7 (L) 07/27/2016 0318   PROT 6.0 (L) 07/27/2016 0318   ALBUMIN 2.6 (L) 07/27/2016 0318   AST 33 07/27/2016 0318   ALT 24 07/27/2016 0318   ALKPHOS 69 07/27/2016 0318   BILITOT 0.4 07/27/2016 0318   GFRNONAA 36 (L) 07/27/2016 0318   GFRAA 42 (L) 07/27/2016 0318   Lab Results  Component Value Date   CHOL 173 07/25/2016   HDL 33 (L) 07/25/2016   LDLCALC 98 07/25/2016   TRIG 208 (H) 07/25/2016   CHOLHDL 5.2 07/25/2016   Lab Results  Component Value Date   HGBA1C 11.9 (H) 07/25/2016   No results found for: VITAMINB12 No results found for: TSH  07/22/16 MRI brain [I reviewed images myself and agree with interpretation. DWI changes also could be related to peri-ictal MRI changes. -VRP]  - Unusual imaging pattern consistent with late acute to subacute ischemia, affecting the posterior frontal cortex and subcortical white matter; watershed pattern of infarction is favored. See discussion above. - No structural abnormality is seen which might contribute to epileptiform activity. - No features suggestive of PRES. - No abnormal postcontrast enhancement. - Extensive atrophy and small vessel  disease.  07/22/16 EEG  - This EEG is markedly abnormal due to diffuse background suppression and lack of EEG reactivity with noxious stimulation. - Clinical Correlation of the above findings indicates severe diffuse cerebral dysfunction that is likely due to propofol sedation, but can also be seen in the setting of anoxic/ischemic injury or toxic/metabolic encephalopathies.  Clinical correlation is advised.  No electrographic seizures are seen.  07/25/16 TTE - Normal LV systolic function; mild diastolic dysfunction; mild   LVH; trace MR and TR.  07/26/16 carotid u/s - Findings suggest 1-39% right internal carotid artery stenosis. - The left distal common carotid artery exhibits elevated velocities suggestive of >50% stenosis. - The left proximal internal carotid artery exhibits elevated peak systolic velocities suggestive of low range 80-99% stenosis, and elevated end diastolic velocities suggestive of 40-59% stenosis. This may be due to distal common carotid artery stenosis, however internal carotid artery stenosis cannot be excluded. - The left vertebral artery exhibits an atypical waveform. This finding, along with the elevated left subclavian artery velocity is suggestive of proximal stenosis.  08/27/16 carotid u/s - thrombus in mid-distal common carotid artery - altered hemodynamics in the ICA due to proximal thrombus - left vertebral velocity at origin is 176 cm/sec and waveform atypical - left subclavian velocity is 441 cm/sec  09/10/16 CTA neck [I reviewed images myself and agree with interpretation. -VRP]  1. Left common carotid artery long segment of fibrofatty plaque with stenosis measuring up to 70%, severe. 2. Right common carotid artery diffuse fibrofatty plaque with multiple areas of mild less than 50% stenosis. 3. Bilateral carotid bifurcations and internal carotid artery is are widely patent. 4. Left mid subclavian artery fibrofatty plaque with 60-70% stenosis,  moderate to severe. 5. Left vertebral artery origin fibrofatty plaque with severe Stenosis.    ASSESSMENT AND PLAN  66 y.o. year old male here with new onset seizure on 07/21/16, in the setting of significant hyperglycemia 619.   Also patient was found to have acute to subacute ischemic infarctions in left greater than right posterior  frontal and subcortical white matter. Carotid ultrasound raise possibility of proximal left common carotid artery stenosis, and this was followed up by vascular surgery in the hospital as well as outpatient clinic. Follow-up carotid ultrasound showed thrombus in the mid-distal common carotid artery however this was not confirmed on CTA on 09/10/16. Instead patient was found to have a long segment fibrofatty plaque with approximately 70% stenosis into the mid to distal left common carotid artery. Patient was evaluated vascular surgeon Dr. Donnetta Hutching who did not feel that surgery was indicated for this lesion.   In retrospect patient may have had episode of hypotension in the past, leading to watershed ischemic infarctions, not definitely symptomatic. Patient then may have had seizure on 07/21/16 in the setting of hyperglycemia, not definitely related to the strokes. Alternatively patient may have had hyperglycemia and seizures, with associated perirectal MRI changes.   Going forward patient recommended to optimize medical management of stroke risk factors. I do not think he needs antiseizure medicine at this time.    Dx:  1. Cerebrovascular accident (CVA) due to other mechanism ([place])   2. Seizure ([place])   3. DM (diabetes mellitus), type 2, uncontrolled, periph vascular complic ([facility])      PLAN:  SECONDARY STROKE PREVENTION - aspirin 325, statin, BP control, DM control  SEIZURE (new onset, in setting of hyperglycemia) - monitor for now - continue DM control - no driving until seizure free x 6 months  LEFT COMMON CAROTID ARTERY STENOSIS (long segment, smooth;  likely asymptomatic) - med mgmt; follow up with PCP for HTN and DM control - follow up with vascular surgery with follow up carotid u/s   Return in about 4 months (around 01/19/2017).  I reviewed images, labs, notes, records myself. I summarized findings and reviewed with patient, for this high risk condition (seizure and stroke) requiring high complexity decision making.    [name], MD 6/65/9935, 70:17 AM Certified in Neurology, Neurophysiology and Neuroimaging  [facility] [address] [place], [place] [address] [phone]

## 2016-09-26 NOTE — Progress Notes (Signed)
I agree patient had unusual clinical presentation with seizures but MRI findings do suggest subacute watershed left brain infarcts likely symptomatic from moderate proximal left common carotid artery stenosis. The right brain lesions are difficult to explain but may be from small vessel disease. I think the patient does need elective left carotid revascularization and verbal discussion with Dr. Donnetta Hutching will help in decision-making

## 2016-10-01 ENCOUNTER — Telehealth: Payer: Self-pay | Admitting: *Deleted

## 2016-10-01 NOTE — Telephone Encounter (Signed)
Spoke with patient to reschedule his 4 month FU due to dr out of office. Rescheduled for 02/05/17; advised he arrive 30 min early. Patient verbalized understanding, appreciation.

## 2016-12-04 ENCOUNTER — Telehealth: Payer: Self-pay | Admitting: *Deleted

## 2016-12-04 NOTE — Telephone Encounter (Signed)
LVM requesting patient call back to reschedule FU in Dec. Advised him there are openings on the same day,b ut his appt needs to be moved to later that day. Marland Kitchen

## 2016-12-18 ENCOUNTER — Encounter: Payer: Self-pay | Admitting: *Deleted

## 2016-12-18 NOTE — Telephone Encounter (Signed)
Letter mailed today requesting patient call and reschedule Dec 2018 FU. Advised Dr Leta Baptist will not be available at 8am that day, but there are other openings that day and week as well as in the month of Dec 2018. Included number for call back.

## 2017-01-20 ENCOUNTER — Ambulatory Visit: Payer: Medicare HMO | Admitting: Diagnostic Neuroimaging

## 2017-02-05 ENCOUNTER — Ambulatory Visit: Payer: Self-pay | Admitting: Diagnostic Neuroimaging

## 2017-03-18 ENCOUNTER — Encounter (HOSPITAL_COMMUNITY): Payer: Medicare HMO

## 2017-03-18 ENCOUNTER — Ambulatory Visit: Payer: Medicare HMO | Admitting: Vascular Surgery

## 2017-04-11 ENCOUNTER — Inpatient Hospital Stay (HOSPITAL_COMMUNITY): Payer: Medicare HMO

## 2017-04-11 ENCOUNTER — Emergency Department (HOSPITAL_COMMUNITY): Payer: Medicare HMO

## 2017-04-11 ENCOUNTER — Encounter (HOSPITAL_COMMUNITY): Payer: Self-pay | Admitting: Emergency Medicine

## 2017-04-11 ENCOUNTER — Inpatient Hospital Stay (HOSPITAL_COMMUNITY)
Admission: EM | Admit: 2017-04-11 | Discharge: 2017-05-13 | DRG: 064 | Disposition: A | Discharge status: Skilled Nursing Facility | Payer: Medicare HMO | Attending: Internal Medicine | Admitting: Internal Medicine

## 2017-04-11 DIAGNOSIS — E860 Dehydration: Secondary | ICD-10-CM

## 2017-04-11 DIAGNOSIS — D6489 Other specified anemias: Secondary | ICD-10-CM | POA: Diagnosis present

## 2017-04-11 DIAGNOSIS — Z794 Long term (current) use of insulin: Secondary | ICD-10-CM

## 2017-04-11 DIAGNOSIS — K9423 Gastrostomy malfunction: Secondary | ICD-10-CM | POA: Diagnosis not present

## 2017-04-11 DIAGNOSIS — Z87891 Personal history of nicotine dependence: Secondary | ICD-10-CM

## 2017-04-11 DIAGNOSIS — E118 Type 2 diabetes mellitus with unspecified complications: Secondary | ICD-10-CM | POA: Diagnosis not present

## 2017-04-11 DIAGNOSIS — N183 Chronic kidney disease, stage 3 unspecified: Secondary | ICD-10-CM | POA: Diagnosis present

## 2017-04-11 DIAGNOSIS — E872 Acidosis: Secondary | ICD-10-CM | POA: Diagnosis present

## 2017-04-11 DIAGNOSIS — I6523 Occlusion and stenosis of bilateral carotid arteries: Secondary | ICD-10-CM | POA: Diagnosis present

## 2017-04-11 DIAGNOSIS — E876 Hypokalemia: Secondary | ICD-10-CM | POA: Diagnosis present

## 2017-04-11 DIAGNOSIS — F1410 Cocaine abuse, uncomplicated: Secondary | ICD-10-CM | POA: Diagnosis present

## 2017-04-11 DIAGNOSIS — R1310 Dysphagia, unspecified: Secondary | ICD-10-CM | POA: Diagnosis present

## 2017-04-11 DIAGNOSIS — D631 Anemia in chronic kidney disease: Secondary | ICD-10-CM | POA: Diagnosis present

## 2017-04-11 DIAGNOSIS — I639 Cerebral infarction, unspecified: Secondary | ICD-10-CM | POA: Diagnosis present

## 2017-04-11 DIAGNOSIS — Z9911 Dependence on respirator [ventilator] status: Secondary | ICD-10-CM | POA: Diagnosis not present

## 2017-04-11 DIAGNOSIS — E1165 Type 2 diabetes mellitus with hyperglycemia: Secondary | ICD-10-CM | POA: Diagnosis not present

## 2017-04-11 DIAGNOSIS — I161 Hypertensive emergency: Secondary | ICD-10-CM | POA: Diagnosis present

## 2017-04-11 DIAGNOSIS — R509 Fever, unspecified: Secondary | ICD-10-CM

## 2017-04-11 DIAGNOSIS — G8191 Hemiplegia, unspecified affecting right dominant side: Secondary | ICD-10-CM | POA: Diagnosis present

## 2017-04-11 DIAGNOSIS — I129 Hypertensive chronic kidney disease with stage 1 through stage 4 chronic kidney disease, or unspecified chronic kidney disease: Secondary | ICD-10-CM | POA: Diagnosis present

## 2017-04-11 DIAGNOSIS — N189 Chronic kidney disease, unspecified: Secondary | ICD-10-CM | POA: Diagnosis not present

## 2017-04-11 DIAGNOSIS — E875 Hyperkalemia: Secondary | ICD-10-CM | POA: Diagnosis not present

## 2017-04-11 DIAGNOSIS — N281 Cyst of kidney, acquired: Secondary | ICD-10-CM | POA: Diagnosis present

## 2017-04-11 DIAGNOSIS — R040 Epistaxis: Secondary | ICD-10-CM | POA: Diagnosis not present

## 2017-04-11 DIAGNOSIS — E1122 Type 2 diabetes mellitus with diabetic chronic kidney disease: Secondary | ICD-10-CM | POA: Diagnosis present

## 2017-04-11 DIAGNOSIS — Z978 Presence of other specified devices: Secondary | ICD-10-CM

## 2017-04-11 DIAGNOSIS — E785 Hyperlipidemia, unspecified: Secondary | ICD-10-CM | POA: Diagnosis present

## 2017-04-11 DIAGNOSIS — N35819 Other urethral stricture, male, unspecified site: Secondary | ICD-10-CM | POA: Diagnosis present

## 2017-04-11 DIAGNOSIS — L8990 Pressure ulcer of unspecified site, unspecified stage: Secondary | ICD-10-CM

## 2017-04-11 DIAGNOSIS — S0101XA Laceration without foreign body of scalp, initial encounter: Secondary | ICD-10-CM | POA: Diagnosis present

## 2017-04-11 DIAGNOSIS — D649 Anemia, unspecified: Secondary | ICD-10-CM | POA: Diagnosis not present

## 2017-04-11 DIAGNOSIS — E46 Unspecified protein-calorie malnutrition: Secondary | ICD-10-CM | POA: Diagnosis present

## 2017-04-11 DIAGNOSIS — W19XXXA Unspecified fall, initial encounter: Secondary | ICD-10-CM | POA: Diagnosis present

## 2017-04-11 DIAGNOSIS — Z7982 Long term (current) use of aspirin: Secondary | ICD-10-CM

## 2017-04-11 DIAGNOSIS — E8770 Fluid overload, unspecified: Secondary | ICD-10-CM | POA: Diagnosis present

## 2017-04-11 DIAGNOSIS — Z6824 Body mass index (BMI) 24.0-24.9, adult: Secondary | ICD-10-CM | POA: Diagnosis not present

## 2017-04-11 DIAGNOSIS — Z4659 Encounter for fitting and adjustment of other gastrointestinal appliance and device: Secondary | ICD-10-CM

## 2017-04-11 DIAGNOSIS — D638 Anemia in other chronic diseases classified elsewhere: Secondary | ICD-10-CM | POA: Diagnosis not present

## 2017-04-11 DIAGNOSIS — D72829 Elevated white blood cell count, unspecified: Secondary | ICD-10-CM | POA: Diagnosis not present

## 2017-04-11 DIAGNOSIS — G960 Cerebrospinal fluid leak: Secondary | ICD-10-CM | POA: Diagnosis not present

## 2017-04-11 DIAGNOSIS — R739 Hyperglycemia, unspecified: Secondary | ICD-10-CM

## 2017-04-11 DIAGNOSIS — J690 Pneumonitis due to inhalation of food and vomit: Secondary | ICD-10-CM | POA: Diagnosis present

## 2017-04-11 DIAGNOSIS — R339 Retention of urine, unspecified: Secondary | ICD-10-CM | POA: Diagnosis present

## 2017-04-11 DIAGNOSIS — Z23 Encounter for immunization: Secondary | ICD-10-CM | POA: Diagnosis present

## 2017-04-11 DIAGNOSIS — I10 Essential (primary) hypertension: Secondary | ICD-10-CM | POA: Diagnosis not present

## 2017-04-11 DIAGNOSIS — G9340 Encephalopathy, unspecified: Secondary | ICD-10-CM

## 2017-04-11 DIAGNOSIS — Z7189 Other specified counseling: Secondary | ICD-10-CM

## 2017-04-11 DIAGNOSIS — I340 Nonrheumatic mitral (valve) insufficiency: Secondary | ICD-10-CM | POA: Diagnosis not present

## 2017-04-11 DIAGNOSIS — J9601 Acute respiratory failure with hypoxia: Secondary | ICD-10-CM

## 2017-04-11 DIAGNOSIS — G40901 Epilepsy, unspecified, not intractable, with status epilepticus: Secondary | ICD-10-CM

## 2017-04-11 DIAGNOSIS — E1101 Type 2 diabetes mellitus with hyperosmolarity with coma: Secondary | ICD-10-CM | POA: Diagnosis not present

## 2017-04-11 DIAGNOSIS — Z515 Encounter for palliative care: Secondary | ICD-10-CM

## 2017-04-11 DIAGNOSIS — R4789 Other speech disturbances: Secondary | ICD-10-CM | POA: Diagnosis not present

## 2017-04-11 DIAGNOSIS — N179 Acute kidney failure, unspecified: Secondary | ICD-10-CM | POA: Diagnosis not present

## 2017-04-11 DIAGNOSIS — R569 Unspecified convulsions: Secondary | ICD-10-CM

## 2017-04-11 DIAGNOSIS — E870 Hyperosmolality and hypernatremia: Secondary | ICD-10-CM | POA: Diagnosis not present

## 2017-04-11 DIAGNOSIS — I82621 Acute embolism and thrombosis of deep veins of right upper extremity: Secondary | ICD-10-CM | POA: Diagnosis not present

## 2017-04-11 DIAGNOSIS — I6389 Other cerebral infarction: Secondary | ICD-10-CM | POA: Diagnosis not present

## 2017-04-11 DIAGNOSIS — F1910 Other psychoactive substance abuse, uncomplicated: Secondary | ICD-10-CM | POA: Diagnosis not present

## 2017-04-11 DIAGNOSIS — E878 Other disorders of electrolyte and fluid balance, not elsewhere classified: Secondary | ICD-10-CM | POA: Diagnosis present

## 2017-04-11 DIAGNOSIS — G40401 Other generalized epilepsy and epileptic syndromes, not intractable, with status epilepticus: Secondary | ICD-10-CM | POA: Diagnosis present

## 2017-04-11 DIAGNOSIS — I63513 Cerebral infarction due to unspecified occlusion or stenosis of bilateral middle cerebral arteries: Secondary | ICD-10-CM | POA: Diagnosis not present

## 2017-04-11 DIAGNOSIS — IMO0002 Reserved for concepts with insufficient information to code with codable children: Secondary | ICD-10-CM | POA: Diagnosis present

## 2017-04-11 DIAGNOSIS — I6330 Cerebral infarction due to thrombosis of unspecified cerebral artery: Secondary | ICD-10-CM

## 2017-04-11 DIAGNOSIS — A419 Sepsis, unspecified organism: Secondary | ICD-10-CM | POA: Diagnosis not present

## 2017-04-11 DIAGNOSIS — I82A11 Acute embolism and thrombosis of right axillary vein: Secondary | ICD-10-CM | POA: Diagnosis not present

## 2017-04-11 DIAGNOSIS — E1151 Type 2 diabetes mellitus with diabetic peripheral angiopathy without gangrene: Secondary | ICD-10-CM | POA: Diagnosis present

## 2017-04-11 DIAGNOSIS — E1100 Type 2 diabetes mellitus with hyperosmolarity without nonketotic hyperglycemic-hyperosmolar coma (NKHHC): Secondary | ICD-10-CM | POA: Diagnosis present

## 2017-04-11 DIAGNOSIS — G92 Toxic encephalopathy: Secondary | ICD-10-CM | POA: Diagnosis present

## 2017-04-11 DIAGNOSIS — M7989 Other specified soft tissue disorders: Secondary | ICD-10-CM | POA: Diagnosis not present

## 2017-04-11 DIAGNOSIS — J9600 Acute respiratory failure, unspecified whether with hypoxia or hypercapnia: Secondary | ICD-10-CM

## 2017-04-11 DIAGNOSIS — K567 Ileus, unspecified: Secondary | ICD-10-CM

## 2017-04-11 DIAGNOSIS — R1032 Left lower quadrant pain: Secondary | ICD-10-CM | POA: Diagnosis not present

## 2017-04-11 DIAGNOSIS — H5500 Unspecified nystagmus: Secondary | ICD-10-CM | POA: Diagnosis present

## 2017-04-11 DIAGNOSIS — Z452 Encounter for adjustment and management of vascular access device: Secondary | ICD-10-CM

## 2017-04-11 DIAGNOSIS — Z8673 Personal history of transient ischemic attack (TIA), and cerebral infarction without residual deficits: Secondary | ICD-10-CM

## 2017-04-11 DIAGNOSIS — R4702 Dysphasia: Secondary | ICD-10-CM | POA: Diagnosis not present

## 2017-04-11 DIAGNOSIS — I69391 Dysphagia following cerebral infarction: Secondary | ICD-10-CM

## 2017-04-11 DIAGNOSIS — R4701 Aphasia: Secondary | ICD-10-CM | POA: Diagnosis present

## 2017-04-11 DIAGNOSIS — N184 Chronic kidney disease, stage 4 (severe): Secondary | ICD-10-CM | POA: Diagnosis not present

## 2017-04-11 DIAGNOSIS — J9690 Respiratory failure, unspecified, unspecified whether with hypoxia or hypercapnia: Secondary | ICD-10-CM

## 2017-04-11 DIAGNOSIS — Z79899 Other long term (current) drug therapy: Secondary | ICD-10-CM

## 2017-04-11 LAB — BASIC METABOLIC PANEL
ANION GAP: 17 — AB (ref 5–15)
ANION GAP: 14 (ref 5–15)
ANION GAP: 13 (ref 5–15)
Anion gap: 12 (ref 5–15)
BUN: 21 mg/dL — ABNORMAL HIGH (ref 6–20)
BUN: 18 mg/dL (ref 6–20)
BUN: 20 mg/dL (ref 6–20)
BUN: 22 mg/dL — ABNORMAL HIGH (ref 6–20)
CHLORIDE: 102 mmol/L (ref 101–111)
CHLORIDE: 111 mmol/L (ref 101–111)
CHLORIDE: 109 mmol/L (ref 101–111)
CHLORIDE: 110 mmol/L (ref 101–111)
CO2: 17 mmol/L — AB (ref 22–32)
CO2: 22 mmol/L (ref 22–32)
CO2: 23 mmol/L (ref 22–32)
CO2: 22 mmol/L (ref 22–32)
Calcium: 8.5 mg/dL — ABNORMAL LOW (ref 8.9–10.3)
Calcium: 8.1 mg/dL — ABNORMAL LOW (ref 8.9–10.3)
Calcium: 8.6 mg/dL — ABNORMAL LOW (ref 8.9–10.3)
Calcium: 8.1 mg/dL — ABNORMAL LOW (ref 8.9–10.3)
Creatinine, Ser: 2.46 mg/dL — ABNORMAL HIGH (ref 0.61–1.24)
Creatinine, Ser: 2.54 mg/dL — ABNORMAL HIGH (ref 0.61–1.24)
Creatinine, Ser: 2.75 mg/dL — ABNORMAL HIGH (ref 0.61–1.24)
Creatinine, Ser: 3.1 mg/dL — ABNORMAL HIGH (ref 0.61–1.24)
GFR calc non Af Amer: 26 mL/min — ABNORMAL LOW (ref >60)
GFR calc non Af Amer: 25 mL/min — ABNORMAL LOW (ref >60)
GFR calc non Af Amer: 22 mL/min — ABNORMAL LOW (ref >60)
GFR, EST AFRICAN AMERICAN: 30 mL/min — AB (ref >60)
GFR, EST AFRICAN AMERICAN: 29 mL/min — AB (ref >60)
GFR, EST AFRICAN AMERICAN: 26 mL/min — AB (ref >60)
GFR, EST AFRICAN AMERICAN: 23 mL/min — AB (ref >60)
GFR, EST NON AFRICAN AMERICAN: 19 mL/min — AB (ref >60)
GLUCOSE: 697 mg/dL — AB (ref 65–99)
Glucose, Bld: 191 mg/dL — ABNORMAL HIGH (ref 65–99)
Glucose, Bld: 105 mg/dL — ABNORMAL HIGH (ref 65–99)
Glucose, Bld: 121 mg/dL — ABNORMAL HIGH (ref 65–99)
POTASSIUM: 4.6 mmol/L (ref 3.5–5.1)
POTASSIUM: 3 mmol/L — AB (ref 3.5–5.1)
POTASSIUM: 3.2 mmol/L — AB (ref 3.5–5.1)
Potassium: 2.9 mmol/L — ABNORMAL LOW (ref 3.5–5.1)
SODIUM: 145 mmol/L (ref 135–145)
SODIUM: 146 mmol/L — AB (ref 135–145)
SODIUM: 145 mmol/L (ref 135–145)
Sodium: 136 mmol/L (ref 135–145)

## 2017-04-11 LAB — I-STAT ARTERIAL BLOOD GAS, ED
Acid-Base Excess: 1 mmol/L (ref 0.0–2.0)
Acid-base deficit: 1 mmol/L (ref 0.0–2.0)
BICARBONATE: 23.3 mmol/L (ref 20.0–28.0)
Bicarbonate: 25.4 mmol/L (ref 20.0–28.0)
O2 SAT: 97 %
O2 Saturation: 100 %
PCO2 ART: 34.8 mmHg (ref 32.0–48.0)
PCO2 ART: 39.4 mmHg (ref 32.0–48.0)
PH ART: 7.431 (ref 7.350–7.450)
PH ART: 7.414 (ref 7.350–7.450)
Patient temperature: 97.6
TCO2: 24 mmol/L (ref 22–32)
TCO2: 27 mmol/L (ref 22–32)
pO2, Arterial: 82 mmHg — ABNORMAL LOW (ref 83.0–108.0)
pO2, Arterial: 403 mmHg — ABNORMAL HIGH (ref 83.0–108.0)

## 2017-04-11 LAB — URINALYSIS, ROUTINE W REFLEX MICROSCOPIC
Bacteria, UA: NONE SEEN
Bilirubin Urine: NEGATIVE
Ketones, ur: NEGATIVE mg/dL
LEUKOCYTES UA: NEGATIVE
NITRITE: NEGATIVE
PH: 7 (ref 5.0–8.0)
Protein, ur: 100 mg/dL — AB
SPECIFIC GRAVITY, URINE: 1.013 (ref 1.005–1.030)
SQUAMOUS EPITHELIAL / LPF: NONE SEEN

## 2017-04-11 LAB — COMPREHENSIVE METABOLIC PANEL
ALBUMIN: 2.8 g/dL — AB (ref 3.5–5.0)
ALT: 23 U/L (ref 17–63)
ANION GAP: 16 — AB (ref 5–15)
AST: 52 U/L — ABNORMAL HIGH (ref 15–41)
Alkaline Phosphatase: 119 U/L (ref 38–126)
BILIRUBIN TOTAL: 0.5 mg/dL (ref 0.3–1.2)
BUN: 21 mg/dL — ABNORMAL HIGH (ref 6–20)
CO2: 21 mmol/L — ABNORMAL LOW (ref 22–32)
Calcium: 8.4 mg/dL — ABNORMAL LOW (ref 8.9–10.3)
Chloride: 98 mmol/L — ABNORMAL LOW (ref 101–111)
Creatinine, Ser: 2.71 mg/dL — ABNORMAL HIGH (ref 0.61–1.24)
GFR calc non Af Amer: 23 mL/min — ABNORMAL LOW (ref >60)
GFR, EST AFRICAN AMERICAN: 27 mL/min — AB (ref >60)
GLUCOSE: 715 mg/dL — AB (ref 65–99)
POTASSIUM: 3.5 mmol/L (ref 3.5–5.1)
Sodium: 135 mmol/L (ref 135–145)
TOTAL PROTEIN: 5.6 g/dL — AB (ref 6.5–8.1)

## 2017-04-11 LAB — PROCALCITONIN: PROCALCITONIN: 0.37 ng/mL

## 2017-04-11 LAB — PHOSPHORUS
Phosphorus: 2.2 mg/dL — ABNORMAL LOW (ref 2.5–4.6)
Phosphorus: 2 mg/dL — ABNORMAL LOW (ref 2.5–4.6)

## 2017-04-11 LAB — GLUCOSE, CAPILLARY
GLUCOSE-CAPILLARY: 153 mg/dL — AB (ref 65–99)
GLUCOSE-CAPILLARY: 84 mg/dL (ref 65–99)
GLUCOSE-CAPILLARY: 121 mg/dL — AB (ref 65–99)
GLUCOSE-CAPILLARY: 59 mg/dL — AB (ref 65–99)
Glucose-Capillary: 280 mg/dL — ABNORMAL HIGH (ref 65–99)
Glucose-Capillary: 189 mg/dL — ABNORMAL HIGH (ref 65–99)
Glucose-Capillary: 113 mg/dL — ABNORMAL HIGH (ref 65–99)
Glucose-Capillary: 157 mg/dL — ABNORMAL HIGH (ref 65–99)
Glucose-Capillary: 185 mg/dL — ABNORMAL HIGH (ref 65–99)
Glucose-Capillary: 216 mg/dL — ABNORMAL HIGH (ref 65–99)

## 2017-04-11 LAB — RAPID URINE DRUG SCREEN, HOSP PERFORMED
AMPHETAMINES: NOT DETECTED
BARBITURATES: NOT DETECTED
Benzodiazepines: POSITIVE — AB
Cocaine: POSITIVE — AB
Opiates: NOT DETECTED
TETRAHYDROCANNABINOL: NOT DETECTED

## 2017-04-11 LAB — CK: CK TOTAL: 97 U/L (ref 49–397)

## 2017-04-11 LAB — LACTIC ACID, PLASMA
LACTIC ACID, VENOUS: 4.2 mmol/L — AB (ref 0.5–1.9)
Lactic Acid, Venous: 2.9 mmol/L (ref 0.5–1.9)

## 2017-04-11 LAB — ETHANOL: Alcohol, Ethyl (B): <10 mg/dL (ref <10)

## 2017-04-11 LAB — CBC
HEMATOCRIT: 33.5 % — AB (ref 39.0–52.0)
HEMOGLOBIN: 11 g/dL — AB (ref 13.0–17.0)
MCH: 26.8 pg (ref 26.0–34.0)
MCHC: 32.8 g/dL (ref 30.0–36.0)
MCV: 81.7 fL (ref 78.0–100.0)
Platelets: 214 10*3/uL (ref 150–400)
RBC: 4.1 MIL/uL — AB (ref 4.22–5.81)
RDW: 12.7 % (ref 11.5–15.5)
WBC: 5.5 10*3/uL (ref 4.0–10.5)

## 2017-04-11 LAB — BETA-HYDROXYBUTYRIC ACID: Beta-Hydroxybutyric Acid: 0.05 mmol/L (ref 0.05–0.27)

## 2017-04-11 LAB — SALICYLATE LEVEL: Salicylate Lvl: <7 mg/dL (ref 2.8–30.0)

## 2017-04-11 LAB — PROTIME-INR
INR: 0.91
PROTHROMBIN TIME: 12.2 s (ref 11.4–15.2)

## 2017-04-11 LAB — TROPONIN I
TROPONIN I: 0.06 ng/mL — AB (ref <0.03)
TROPONIN I: 0.11 ng/mL — AB (ref <0.03)

## 2017-04-11 LAB — MRSA PCR SCREENING: MRSA by PCR: NEGATIVE

## 2017-04-11 LAB — CBG MONITORING, ED
GLUCOSE-CAPILLARY: 596 mg/dL — AB (ref 65–99)
GLUCOSE-CAPILLARY: 594 mg/dL — AB (ref 65–99)
Glucose-Capillary: >600 mg/dL (ref 65–99)
Glucose-Capillary: >600 mg/dL (ref 65–99)
Glucose-Capillary: >600 mg/dL (ref 65–99)

## 2017-04-11 LAB — ACETAMINOPHEN LEVEL

## 2017-04-11 LAB — MAGNESIUM
Magnesium: 1.8 mg/dL (ref 1.7–2.4)
Magnesium: 1.8 mg/dL (ref 1.7–2.4)

## 2017-04-11 LAB — AMMONIA: AMMONIA: 52 umol/L — AB (ref 9–35)

## 2017-04-11 LAB — TRIGLYCERIDES: Triglycerides: 87 mg/dL (ref <150)

## 2017-04-11 MED ORDER — SODIUM CHLORIDE 0.9 % IV SOLN
1000.0000 mg | Freq: Once | INTRAVENOUS | Status: AC
  Administered 2017-04-11: 1000 mg via INTRAVENOUS
  Filled 2017-04-11: qty 10

## 2017-04-11 MED ORDER — MIDAZOLAM HCL 2 MG/2ML IJ SOLN
INTRAMUSCULAR | Status: AC
  Filled 2017-04-11: qty 6

## 2017-04-11 MED ORDER — BISACODYL 10 MG RE SUPP
10.0000 mg | Freq: Every day | RECTAL | Status: DC | PRN
  Administered 2017-04-15 – 2017-05-07 (×3): 10 mg via RECTAL
  Filled 2017-04-11 (×3): qty 1

## 2017-04-11 MED ORDER — SODIUM CHLORIDE 0.9 % IV BOLUS (SEPSIS)
1000.0000 mL | Freq: Once | INTRAVENOUS | Status: AC
  Administered 2017-04-11: 1000 mL via INTRAVENOUS

## 2017-04-11 MED ORDER — DOCUSATE SODIUM 50 MG/5ML PO LIQD
100.0000 mg | Freq: Two times a day (BID) | ORAL | Status: DC | PRN
  Administered 2017-04-14 – 2017-05-01 (×5): 100 mg
  Filled 2017-04-11 (×5): qty 10

## 2017-04-11 MED ORDER — SODIUM CHLORIDE 0.9 % IV BOLUS (SEPSIS)
1000.0000 mL | Freq: Once | INTRAVENOUS | Status: AC
Start: 2017-04-11 — End: 2017-04-11
  Administered 2017-04-11: 1000 mL via INTRAVENOUS

## 2017-04-11 MED ORDER — POTASSIUM CHLORIDE 20 MEQ/15ML (10%) PO SOLN
40.0000 meq | Freq: Once | ORAL | Status: DC
  Filled 2017-04-11: qty 30

## 2017-04-11 MED ORDER — INSULIN GLARGINE 100 UNIT/ML ~~LOC~~ SOLN
15.0000 [IU] | Freq: Every day | SUBCUTANEOUS | Status: DC
  Administered 2017-04-11 – 2017-04-12 (×2): 15 [IU] via SUBCUTANEOUS
  Filled 2017-04-11 (×3): qty 0.15

## 2017-04-11 MED ORDER — CHLORHEXIDINE GLUCONATE 0.12% ORAL RINSE (MEDLINE KIT)
15.0000 mL | Freq: Two times a day (BID) | OROMUCOSAL | Status: DC
  Administered 2017-04-11 – 2017-05-03 (×46): 15 mL via OROMUCOSAL

## 2017-04-11 MED ORDER — SODIUM CHLORIDE 0.9 % IV SOLN
0.0000 mg/h | INTRAVENOUS | Status: DC
  Filled 2017-04-11: qty 10

## 2017-04-11 MED ORDER — PANTOPRAZOLE SODIUM 40 MG IV SOLR
40.0000 mg | Freq: Every day | INTRAVENOUS | Status: DC
  Administered 2017-04-11 – 2017-04-15 (×5): 40 mg via INTRAVENOUS
  Filled 2017-04-11 (×5): qty 40

## 2017-04-11 MED ORDER — SODIUM CHLORIDE 0.9 % IV SOLN
3.0000 g | Freq: Two times a day (BID) | INTRAVENOUS | Status: AC
  Administered 2017-04-11 – 2017-04-17 (×13): 3 g via INTRAVENOUS
  Filled 2017-04-11 (×13): qty 3

## 2017-04-11 MED ORDER — FENTANYL BOLUS VIA INFUSION
25.0000 ug | INTRAVENOUS | Status: DC | PRN
  Administered 2017-04-11 – 2017-04-16 (×12): 25 ug via INTRAVENOUS
  Administered 2017-04-16: 125 ug via INTRAVENOUS
  Filled 2017-04-11: qty 25

## 2017-04-11 MED ORDER — NICARDIPINE HCL IN NACL 20-0.86 MG/200ML-% IV SOLN
0.0000 mg/h | INTRAVENOUS | Status: DC
  Administered 2017-04-14: 10 mg/h via INTRAVENOUS
  Administered 2017-04-14: 5 mg/h via INTRAVENOUS
  Administered 2017-04-14: 12.5 mg/h via INTRAVENOUS
  Administered 2017-04-14: 15 mg/h via INTRAVENOUS
  Filled 2017-04-11 (×4): qty 200

## 2017-04-11 MED ORDER — MIDAZOLAM HCL 2 MG/2ML IJ SOLN
INTRAMUSCULAR | Status: AC
  Filled 2017-04-11: qty 2

## 2017-04-11 MED ORDER — SODIUM CHLORIDE 0.9 % IV SOLN
1000.0000 mg | Freq: Two times a day (BID) | INTRAVENOUS | Status: DC
  Administered 2017-04-11 – 2017-04-12 (×2): 1000 mg via INTRAVENOUS
  Filled 2017-04-11 (×3): qty 10

## 2017-04-11 MED ORDER — ORAL CARE MOUTH RINSE
15.0000 mL | Freq: Four times a day (QID) | OROMUCOSAL | Status: DC
  Administered 2017-04-11 – 2017-04-13 (×10): 15 mL via OROMUCOSAL

## 2017-04-11 MED ORDER — LIDOCAINE-EPINEPHRINE (PF) 2 %-1:200000 IJ SOLN
20.0000 mL | Freq: Once | INTRAMUSCULAR | Status: AC
  Administered 2017-04-11: 20 mL

## 2017-04-11 MED ORDER — PROPOFOL 1000 MG/100ML IV EMUL
INTRAVENOUS | Status: AC
  Filled 2017-04-11: qty 100

## 2017-04-11 MED ORDER — SODIUM CHLORIDE 0.9 % IV SOLN
1500.0000 mg | INTRAVENOUS | Status: AC
  Administered 2017-04-11: 1500 mg via INTRAVENOUS
  Filled 2017-04-11: qty 30

## 2017-04-11 MED ORDER — LORAZEPAM 2 MG/ML IJ SOLN
INTRAMUSCULAR | Status: AC
  Filled 2017-04-11: qty 1

## 2017-04-11 MED ORDER — LORAZEPAM 2 MG/ML IJ SOLN
1.0000 mg | Freq: Once | INTRAMUSCULAR | Status: AC
  Administered 2017-04-11: 1 mg via INTRAVENOUS

## 2017-04-11 MED ORDER — MIDAZOLAM HCL 5 MG/5ML IJ SOLN
INTRAMUSCULAR | Status: AC | PRN
  Administered 2017-04-11: 5 mg via INTRAVENOUS

## 2017-04-11 MED ORDER — HEPARIN SODIUM (PORCINE) 5000 UNIT/ML IJ SOLN
5000.0000 [IU] | Freq: Three times a day (TID) | INTRAMUSCULAR | Status: DC
  Administered 2017-04-11 – 2017-04-27 (×47): 5000 [IU] via SUBCUTANEOUS
  Filled 2017-04-11 (×51): qty 1

## 2017-04-11 MED ORDER — LABETALOL HCL 5 MG/ML IV SOLN: 10.0000 mg | INTRAVENOUS | Status: DC | PRN

## 2017-04-11 MED ORDER — LORAZEPAM 2 MG/ML IJ SOLN
2.0000 mg | Freq: Once | INTRAMUSCULAR | Status: AC
  Administered 2017-04-11: 2 mg via INTRAVENOUS

## 2017-04-11 MED ORDER — MIDAZOLAM HCL 2 MG/2ML IJ SOLN
2.0000 mg | Freq: Once | INTRAMUSCULAR | Status: AC
  Administered 2017-04-11: 2 mg via INTRAVENOUS

## 2017-04-11 MED ORDER — CLONIDINE HCL 0.1 MG/24HR TD PTWK
0.1000 mg | MEDICATED_PATCH | TRANSDERMAL | Status: DC
  Filled 2017-04-11: qty 1

## 2017-04-11 MED ORDER — PROPOFOL 1000 MG/100ML IV EMUL
0.0000 ug/kg/min | INTRAVENOUS | Status: DC
  Administered 2017-04-11: 5 ug/kg/min via INTRAVENOUS
  Administered 2017-04-11: 30 ug/kg/min via INTRAVENOUS

## 2017-04-11 MED ORDER — HYDRALAZINE HCL 20 MG/ML IJ SOLN
10.0000 mg | Freq: Once | INTRAMUSCULAR | Status: AC
  Administered 2017-04-11: 10 mg via INTRAVENOUS
  Filled 2017-04-11: qty 1

## 2017-04-11 MED ORDER — FENTANYL 2500MCG IN NS 250ML (10MCG/ML) PREMIX INFUSION
25.0000 ug/h | INTRAVENOUS | Status: DC
  Administered 2017-04-11: 150 ug/h via INTRAVENOUS
  Administered 2017-04-11: 50 ug/h via INTRAVENOUS
  Administered 2017-04-12 – 2017-04-13 (×3): 200 ug/h via INTRAVENOUS
  Administered 2017-04-14: 100 ug/h via INTRAVENOUS
  Administered 2017-04-14: 200 ug/h via INTRAVENOUS
  Administered 2017-04-16: 100 ug/h via INTRAVENOUS
  Administered 2017-04-16: 50 ug/h via INTRAVENOUS
  Filled 2017-04-11 (×8): qty 250

## 2017-04-11 MED ORDER — FENTANYL CITRATE (PF) 100 MCG/2ML IJ SOLN
50.0000 ug | INTRAMUSCULAR | Status: DC | PRN
  Administered 2017-04-11: 50 ug via INTRAVENOUS
  Filled 2017-04-11: qty 2

## 2017-04-11 MED ORDER — POTASSIUM CHLORIDE 20 MEQ/15ML (10%) PO SOLN
40.0000 meq | Freq: Two times a day (BID) | ORAL | Status: DC
  Administered 2017-04-11 – 2017-04-14 (×7): 40 meq
  Filled 2017-04-11 (×7): qty 30

## 2017-04-11 MED ORDER — VITAL AF 1.2 CAL PO LIQD
1000.0000 mL | ORAL | Status: DC
  Administered 2017-04-11 – 2017-04-23 (×18): 1000 mL
  Filled 2017-04-11 (×2): qty 1000

## 2017-04-11 MED ORDER — POTASSIUM CHLORIDE 20 MEQ/15ML (10%) PO SOLN: 40.0000 meq | Freq: Two times a day (BID) | ORAL | Status: DC

## 2017-04-11 MED ORDER — MIDAZOLAM HCL 2 MG/2ML IJ SOLN
2.0000 mg | Freq: Once | INTRAMUSCULAR | Status: AC
Start: 2017-04-11 — End: 2017-04-11
  Administered 2017-04-11: 2 mg via INTRAVENOUS
  Filled 2017-04-11: qty 2

## 2017-04-11 MED ORDER — LACTATED RINGERS IV SOLN
INTRAVENOUS | Status: DC
  Administered 2017-04-11: 21:00:00 via INTRAVENOUS
  Administered 2017-04-12: 1000 mL via INTRAVENOUS
  Administered 2017-04-13 – 2017-04-16 (×6): via INTRAVENOUS

## 2017-04-11 MED ORDER — SODIUM CHLORIDE 0.9 % IV SOLN
INTRAVENOUS | Status: DC
  Administered 2017-04-11: 05:00:00 via INTRAVENOUS

## 2017-04-11 MED ORDER — SODIUM CHLORIDE 0.9 % IV SOLN
3.0000 g | Freq: Two times a day (BID) | INTRAVENOUS | Status: DC
  Filled 2017-04-11 (×2): qty 3

## 2017-04-11 MED ORDER — PRO-STAT SUGAR FREE PO LIQD: 30.0000 mL | Freq: Two times a day (BID) | ORAL | Status: DC

## 2017-04-11 MED ORDER — SUCCINYLCHOLINE CHLORIDE 20 MG/ML IJ SOLN
INTRAMUSCULAR | Status: AC | PRN
  Administered 2017-04-11: 150 mg via INTRAVENOUS

## 2017-04-11 MED ORDER — DEXTROSE-NACL 5-0.45 % IV SOLN
INTRAVENOUS | Status: DC
  Administered 2017-04-11: 14:00:00 via INTRAVENOUS

## 2017-04-11 MED ORDER — INSULIN ASPART 100 UNIT/ML ~~LOC~~ SOLN
0.0000 [IU] | SUBCUTANEOUS | Status: DC
  Administered 2017-04-11: 5 [IU] via SUBCUTANEOUS
  Administered 2017-04-11: 3 [IU] via SUBCUTANEOUS
  Administered 2017-04-12 (×2): 2 [IU] via SUBCUTANEOUS
  Administered 2017-04-12: 8 [IU] via SUBCUTANEOUS
  Administered 2017-04-12: 5 [IU] via SUBCUTANEOUS
  Administered 2017-04-13: 2 [IU] via SUBCUTANEOUS
  Administered 2017-04-13: 3 [IU] via SUBCUTANEOUS
  Administered 2017-04-13: 11 [IU] via SUBCUTANEOUS
  Administered 2017-04-13: 5 [IU] via SUBCUTANEOUS
  Administered 2017-04-13: 11 [IU] via SUBCUTANEOUS
  Administered 2017-04-13: 5 [IU] via SUBCUTANEOUS
  Administered 2017-04-13: 3 [IU] via SUBCUTANEOUS
  Administered 2017-04-14: 5 [IU] via SUBCUTANEOUS
  Administered 2017-04-14 (×2): 2 [IU] via SUBCUTANEOUS

## 2017-04-11 MED ORDER — FENTANYL CITRATE (PF) 100 MCG/2ML IJ SOLN: 50.0000 ug | INTRAMUSCULAR | Status: DC | PRN

## 2017-04-11 MED ORDER — THIAMINE HCL 100 MG/ML IJ SOLN
100.0000 mg | Freq: Every day | INTRAMUSCULAR | Status: DC
  Administered 2017-04-11 – 2017-04-17 (×7): 100 mg via INTRAVENOUS
  Filled 2017-04-11 (×2): qty 1
  Filled 2017-04-11: qty 2
  Filled 2017-04-11 (×4): qty 1

## 2017-04-11 MED ORDER — NICARDIPINE HCL IN NACL 20-0.86 MG/200ML-% IV SOLN
INTRAVENOUS | Status: AC
  Filled 2017-04-11: qty 200

## 2017-04-11 MED ORDER — SODIUM CHLORIDE 0.9 % IV SOLN
INTRAVENOUS | Status: DC
  Administered 2017-04-11: 5.4 [IU]/h via INTRAVENOUS
  Filled 2017-04-11: qty 1

## 2017-04-11 MED ORDER — SODIUM CHLORIDE 0.9 % IV SOLN
Freq: Once | INTRAVENOUS | Status: AC
  Administered 2017-04-11: 06:00:00 via INTRAVENOUS

## 2017-04-11 MED ORDER — TETANUS-DIPHTH-ACELL PERTUSSIS 5-2.5-18.5 LF-MCG/0.5 IM SUSP
0.5000 mL | Freq: Once | INTRAMUSCULAR | Status: AC
  Administered 2017-04-11: 0.5 mL via INTRAMUSCULAR
  Filled 2017-04-11: qty 0.5

## 2017-04-11 NOTE — ED Notes (Signed)
Attempted urinary catheter x2 without success

## 2017-04-11 NOTE — ED Notes (Signed)
Paged Dr.Kakrakandy to inform MD of pt's altered mental status and request for medication for agitation. Pt nonverbal, not following commands still. Verbal order obtained for IV ativan. Also informed MD of CBG >600 and hypertension (232/118)

## 2017-04-11 NOTE — ED Provider Notes (Signed)
[place] EMERGENCY DEPARTMENT Provider Note   CSN: [id number] Arrival date & time: 04/11/17  0128     History   Chief Complaint Chief Complaint  Patient presents with  . Seizures  . Altered Mental Status  Level 5 caveat due to altered mental status.  HPI Richard Bennett is a 67 y.o. male.  The history is provided by the EMS personnel. The history is limited by the condition of the patient.  Seizures   This is a new problem. Characteristics include loss of consciousness. The episode was witnessed. There has been no fever. Meds prior to arrival: Versed.  Altered Mental Status   This is a new problem. Episode onset: Unknown. The problem has been rapidly worsening. Associated symptoms include seizures. His past medical history is significant for seizures and CVA.   History of stroke, history of previous seizures, history of diabetes, the patient is a Sales promotion account executive Witness, presents with seizures and altered mental status.  Patient was brought in via EMS for altered mental status and seizures.  He was found wandering down the hall of his apartment and police were called.  They arrived to find him in the bathtub unresponsive.  He then began to have a seizure.  Patient did receive Versed for his seizures.  His glucose is over 600.  No other details are known at this time. Past Medical History:  Diagnosis Date  . Chest pain   . Colon polyps    adenomatous  . Hyperlipidemia   . Hypertension   . Refusal of blood transfusions as patient is Jehovah's Witness   . Stroke ([place])   . Type II diabetes mellitus [facility])     Patient Active Problem List   Diagnosis Date Noted  . Irritable bowel syndrome with diarrhea 08/13/2016  . Anemia, chronic disease 08/13/2016  . CKD (chronic kidney disease) stage 3, GFR 30-59 ml/min (HCC) 08/13/2016  . Left carotid stenosis 07/27/2016  . DM (diabetes mellitus), type 2, uncontrolled, periph vascular complic ([place]) 12/18/5100  . Gait  disturbance, post-stroke   . Seizure ([place])   . Hypertensive emergency 10/30/2015  . AKI (acute kidney injury) ([name]) 10/30/2015  . Pain in the chest   . Essential hypertension   . Hyperlipidemia     Past Surgical History:  Procedure Laterality Date  . APPENDECTOMY    . BACK SURGERY    . LUMBAR DISC SURGERY     "herniated"       Home Medications    Prior to Admission medications   Medication Sig Start Date End Date Taking? Authorizing Provider  amLODipine (NORVASC) 10 MG tablet Take 10 mg by mouth daily.    [provider]  aspirin 325 MG tablet Take 1 tablet (325 mg total) by mouth daily. 07/28/16   Johnson, Clanford L, MD  atorvastatin (LIPITOR) 80 MG tablet Take 1 tablet (80 mg total) by mouth daily at 6 PM. 07/27/16   Johnson, Clanford L, MD  cloNIDine (CATAPRES) 0.1 MG tablet Take 0.1 mg by mouth 2 (two) times daily.    [provider]  Eluxadoline (VIBERZI) 100 MG TABS Take 100 mg by mouth 2 (two) times daily.    [provider]  ferrous sulfate 325 (65 FE) MG tablet Take 1 tablet (325 mg total) by mouth daily with breakfast. 07/28/16   Johnson, Clanford L, MD  hydrochlorothiazide (MICROZIDE) 12.5 MG capsule Take 12.5 mg by mouth daily.    [provider]  LANTUS SOLOSTAR 100 UNIT/ML Solostar Pen  Inject 15 Units into the skin every morning. 7/51/02   Delora Fuel, MD  losartan (COZAAR) 50 MG tablet Take 1 tablet (50 mg total) by mouth daily. 5/85/27   Delora Fuel, MD  metoprolol tartrate (LOPRESSOR) 25 MG tablet Take 1 tablet (25 mg total) by mouth 2 (two) times daily. 07/27/16   Johnson, Clanford L, MD  sodium bicarbonate 650 MG tablet Take 1 tablet (650 mg total) by mouth 2 (two) times daily. 07/27/16   Murlean Iba, MD    Family History Family History  Problem Relation Age of Onset  . Diabetes Mother   . Stroke Mother   . Cancer Father   . Diabetes Brother   . Seizures Brother   . Colon cancer Neg Hx   . Esophageal cancer Neg  Hx   . Stomach cancer Neg Hx   . Rectal cancer Neg Hx     Social History Social History   Tobacco Use  . Smoking status: Former Smoker    Packs/day: 0.50    Years: 20.00    Pack years: 10.00    Types: Cigarettes    Last attempt to quit: 03/04/1978    Years since quitting: 39.1  . Smokeless tobacco: Never Used  Substance Use Topics  . Alcohol use: Yes    Comment: 10/30/2015 "might have 2-3 drinks/year"  . Drug use: Yes    Types: Heroin    Comment: 10/30/2015 "nothing in the 2000s"     Allergies   Patient has no known allergies.   Review of Systems Review of Systems  Unable to perform ROS: Mental status change  Neurological: Positive for seizures and loss of consciousness.     Physical Exam Updated Vital Signs BP (!) 183/105 (BP Location: Right Arm)   Pulse 93   Temp 99.4 F (37.4 C) (Rectal)   Resp 18   SpO2 100%   Physical Exam CONSTITUTIONAL: Disheveled, ill-appearing HEAD: Laceration to posterior scalp, no active bleeding.  Dried blood throughout scalp.  No other signs of trauma. EYES: EOMI/PERRL, no nystagmus ENMT: Mucous membranes moist, no visible facial trauma. SPINE/BACK: No bruising/crepitance/stepoffs noted to spine CV: S1/S2 noted, no murmurs/rubs/gallops noted LUNGS: Lungs are clear to auscultation bilaterally, no apparent distress ABDOMEN: soft, nondistended GU:no cva tenderness NEURO: Pt is awake, but does not respond to voice or commands. EXTREMITIES: pulses normal/equal, full ROM, no deformities SKIN: warm, color normal PSYCH: Unable to assess  ED Treatments / Results  Labs (all labs ordered are listed, but only abnormal results are displayed) Labs Reviewed  COMPREHENSIVE METABOLIC PANEL - Abnormal; Notable for the following components:      Result Value   Chloride 98 (*)    CO2 21 (*)    Glucose, Bld 715 (*)    BUN 21 (*)    Creatinine, Ser 2.71 (*)    Calcium 8.4 (*)    Total Protein 5.6 (*)    Albumin 2.8 (*)    AST 52 (*)     GFR calc non Af Amer 23 (*)    GFR calc Af Amer 27 (*)    Anion gap 16 (*)    All other components within normal limits  CBC - Abnormal; Notable for the following components:   RBC 4.10 (*)    Hemoglobin 11.0 (*)    HCT 33.5 (*)    All other components within normal limits  AMMONIA - Abnormal; Notable for the following components:   Ammonia 52 (*)    All other components  within normal limits  CBG MONITORING, ED - Abnormal; Notable for the following components:   Glucose-Capillary >600 (*)    All other components within normal limits  PROTIME-INR  CK  ACETAMINOPHEN LEVEL  SALICYLATE LEVEL  ETHANOL  RAPID URINE DRUG SCREEN, HOSP PERFORMED  CBG MONITORING, ED    EKG  EKG Interpretation  Date/Time:  Friday April 11 2017 02:05:09 EST Ventricular Rate:  93 PR Interval:    QRS Duration: 117 QT Interval:  399 QTC Calculation: 497 R Axis:   40 Text Interpretation:  Sinus rhythm Consider right atrial enlargement LVH with secondary repolarization abnormality Anterior ST elevation, probably due to LVH Borderline prolonged QT interval Confirmed by Ripley Fraise [phone]) on 04/11/2017 2:09:31 AM       Radiology Ct Head Wo Contrast  Result Date: 04/11/2017 CLINICAL DATA:  Altered mental status with seizure EXAM: CT HEAD WITHOUT CONTRAST CT CERVICAL SPINE WITHOUT CONTRAST TECHNIQUE: Multidetector CT imaging of the head and cervical spine was performed following the standard protocol without intravenous contrast. Multiplanar CT image reconstructions of the cervical spine were also generated. COMPARISON:  CT head and C-spine 07/21/2016 FINDINGS: CT HEAD FINDINGS Brain: No acute territorial infarction, hemorrhage, or intracranial mass is seen. Hypodensity within the periventricular white matter consistent with small-vessel ischemic changes. Stable ventricle size. Slight prominence of extra-axial CSF density at the convexities, could be due to atrophy or tiny chronic subdural effusions.  Vascular: No hyperdense vessels. Scattered calcifications at the carotid siphons. Skull: Normal. Negative for fracture or focal lesion. Sinuses/Orbits: Old fracture medial wall left orbit. Mild sinus mucosal disease in the ethmoid sinuses. Small mucous retention cyst right maxillary sinus. No acute orbital abnormality. Other: None CT CERVICAL SPINE FINDINGS Alignment: Straightening of the cervical spine. No subluxation. Facet alignment within normal limits. Skull base and vertebrae: No acute fracture. No primary bone lesion or focal pathologic process. Soft tissues and spinal canal: No prevertebral fluid or swelling. No visible canal hematoma. Disc levels:  Moderate degenerative changes at C5-C6 and C6-C7. Upper chest: Negative. Other: None IMPRESSION: 1. No CT evidence for acute intracranial abnormality. Atrophy and small vessel ischemic changes of the white matter. 2. Straightening of the cervical spine. No definite acute osseous abnormality. Electronically Signed   By: Donavan Foil M.D.   On: 04/11/2017 02:29   Ct Cervical Spine Wo Contrast  Result Date: 04/11/2017 CLINICAL DATA:  Altered mental status with seizure EXAM: CT HEAD WITHOUT CONTRAST CT CERVICAL SPINE WITHOUT CONTRAST TECHNIQUE: Multidetector CT imaging of the head and cervical spine was performed following the standard protocol without intravenous contrast. Multiplanar CT image reconstructions of the cervical spine were also generated. COMPARISON:  CT head and C-spine 07/21/2016 FINDINGS: CT HEAD FINDINGS Brain: No acute territorial infarction, hemorrhage, or intracranial mass is seen. Hypodensity within the periventricular white matter consistent with small-vessel ischemic changes. Stable ventricle size. Slight prominence of extra-axial CSF density at the convexities, could be due to atrophy or tiny chronic subdural effusions. Vascular: No hyperdense vessels. Scattered calcifications at the carotid siphons. Skull: Normal. Negative for fracture  or focal lesion. Sinuses/Orbits: Old fracture medial wall left orbit. Mild sinus mucosal disease in the ethmoid sinuses. Small mucous retention cyst right maxillary sinus. No acute orbital abnormality. Other: None CT CERVICAL SPINE FINDINGS Alignment: Straightening of the cervical spine. No subluxation. Facet alignment within normal limits. Skull base and vertebrae: No acute fracture. No primary bone lesion or focal pathologic process. Soft tissues and spinal canal: No prevertebral fluid or swelling. No visible  canal hematoma. Disc levels:  Moderate degenerative changes at C5-C6 and C6-C7. Upper chest: Negative. Other: None IMPRESSION: 1. No CT evidence for acute intracranial abnormality. Atrophy and small vessel ischemic changes of the white matter. 2. Straightening of the cervical spine. No definite acute osseous abnormality. Electronically Signed   By: Donavan Foil M.D.   On: 04/11/2017 02:29   Dg Chest Portable 1 View  Result Date: 04/11/2017 CLINICAL DATA:  Status post seizure. EXAM: PORTABLE CHEST 1 VIEW COMPARISON:  Chest radiograph performed 07/24/2016 FINDINGS: The lungs are well-aerated and clear. There is no evidence of focal opacification, pleural effusion or pneumothorax. The cardiomediastinal silhouette is borderline normal in size. No acute osseous abnormalities are seen. IMPRESSION: No acute cardiopulmonary process seen. Electronically Signed   By: Garald Balding M.D.   On: 04/11/2017 02:07    Procedures Procedures  CRITICAL CARE Performed by: Sharyon Cable Total critical care time: 40 minutes Critical care time was exclusive of separately billable procedures and treating other patients. Critical care was necessary to treat or prevent imminent or life-threatening deterioration. Critical care was time spent personally by me on the following activities: development of treatment plan with patient and/or surrogate as well as nursing, discussions with consultants, evaluation of patient's  response to treatment, examination of patient, obtaining history from patient or surrogate, ordering and performing treatments and interventions, ordering and review of laboratory studies, ordering and review of radiographic studies, pulse oximetry and re-evaluation of patient's condition. Patient was status epilepticus, requiring IV Ativan and IV Keppra.  Patient needs to be admitted due to glucose over 600.  Patient with acute renal failure.  Medications Ordered in ED Medications  levETIRAcetam (KEPPRA) 1,000 mg in sodium chloride 0.9 % 100 mL IVPB (1,000 mg Intravenous New Bag/Given 04/11/17 0251)  sodium chloride 0.9 % bolus 1,000 mL (1,000 mLs Intravenous New Bag/Given 04/11/17 0212)  Tdap (BOOSTRIX) injection 0.5 mL (0.5 mLs Intramuscular Given 04/11/17 0211)  lidocaine-EPINEPHrine (XYLOCAINE W/EPI) 2 %-1:200000 (PF) injection 20 mL (20 mLs Infiltration Given 04/11/17 0212)  LORazepam (ATIVAN) injection 1 mg (1 mg Intravenous Given 04/11/17 0235)  sodium chloride 0.9 % bolus 1,000 mL (1,000 mLs Intravenous New Bag/Given 04/11/17 0251)     Initial Impression / Assessment and Plan / ED Course  I have reviewed the triage vital signs and the nursing notes.  Pertinent labs & imaging results that were available during my care of the patient were reviewed by me and considered in my medical decision making (see chart for details).     1:49 AM Patient in the ER for seizures.  He is currently in a postictal state, and he also received Versed.  His glucose is over 600.  Per chart patient has had strokes previously as well as seizures.  Will follow closely 2:36 AM Patient did become awake enough to start talking.  But soon after had another generalized tonic-clonic seizure that terminated spontaneously. Patient was given Ativan IV Keppra was ordered 3:19 AM Patient stable at this time.  Glucose over 600, given IV fluids.  No DKA noted.  He has worsening renal failure.  Discussed with Dr. Hal Hope for  admission.  He will need to have his metabolic abnormalities corrected, IV Keppra has been given Final Clinical Impressions(s) / ED Diagnoses   Final diagnoses:  Status epilepticus ([place])  Dehydration  AKI (acute kidney injury) [facility])  Hyperglycemia    ED Discharge Orders    None       Ripley Fraise, MD 04/11/17 0320

## 2017-04-11 NOTE — ED Notes (Signed)
Pt altered, pulling on cardiac wires, and trying to climb out of bed. Verbal order obtained for versed by Dr.Wickline

## 2017-04-11 NOTE — Progress Notes (Signed)
Seen this AM by Dr. Rory Percy. Moving right arm better now, EEG without ongoing seizure. I suspect provoked seizures secondary to cocaine.   1) Continue keppra 1g BID.   Roland Rack, MD [facility] [phone]  If 7pm- 7am, please page neurology on call as listed in [place].

## 2017-04-11 NOTE — Progress Notes (Signed)
Pt transported to MRI on vent and placed on MRI vent by RT. Pt transported to 2M16 on vent by RT. No complications, will cont to monitor

## 2017-04-11 NOTE — ED Provider Notes (Signed)
LACERATION REPAIR Performed by: Precious Haws, PA-S Authorized by: Montine Circle Consent: Verbal consent obtained. Risks and benefits: risks, benefits and alternatives were discussed Consent given by: patient Patient identity confirmed: provided demographic data Prepped and Draped in normal sterile fashion Wound explored  Laceration Location: Posterior scalp  Laceration Length: 3 cm  No Foreign Bodies seen or palpated  Anesthesia: local infiltration  Local anesthetic: lidocaine 1% with epinephrine  Anesthetic total: 3 ml  Irrigation method: syringe Amount of cleaning: standard  Skin closure: 4-0 vicryl rapide  Number of sutures: 4  Technique: interrupted  Patient tolerance: Patient tolerated the procedure well with no immediate complications.    Montine Circle, PA-C 04/11/17 [id number]    Ripley Fraise, MD 04/11/17 [phone]

## 2017-04-11 NOTE — H&P (Signed)
PULMONARY / CRITICAL CARE MEDICINE   Name: Richard Bennett MRN: [id number] DOB: May 25, 1950    ADMISSION DATE:  04/11/2017 CONSULTATION DATE: 04/11/17  REFERRING MD: ER MD  CHIEF COMPLAINT: Seizures, HTN emergency, Nonketotic hyperglycemia  HISTORY OF PRESENT ILLNESS:   35yoM with hx CVA, DM, HTN, presents to the ER today with acute encephalopathy and seizures. Initially patient was wandering down hallway of apartment and police were called. EMS arrived to find patient in a bathtub initially unresponsive then combative. He had a seizure during this process and was given versed 5mg . Patient transported to [facility] ER where blood glucose 715 initially and patient with acute encephalopathy. Patient had a head lac that was sutured in the ER. He ha a 2nd seizure in the ER. He continued to be altered combative following this seizure, repeatedly trying to climb out of bed. He received a total of 9mg  Versed and 4mg  Ativan while in the ER. He continued to be agitated and was intubated by ER staff.   PAST MEDICAL HISTORY :  He  has a past medical history of Chest pain, Colon polyps, Hyperlipidemia, Hypertension, Refusal of blood transfusions as patient is Jehovah's Witness, Stroke ([place]), and Type II diabetes mellitus ([place]).  PAST SURGICAL HISTORY: He  has a past surgical history that includes Appendectomy; Back surgery; and Lumbar disc surgery.  No Known Allergies  No current facility-administered medications on file prior to encounter.    Current Outpatient Medications on File Prior to Encounter  Medication Sig  . amLODipine (NORVASC) 10 MG tablet Take 10 mg by mouth daily.  Marland Kitchen aspirin 325 MG tablet Take 1 tablet (325 mg total) by mouth daily.  Marland Kitchen atorvastatin (LIPITOR) 80 MG tablet Take 1 tablet (80 mg total) by mouth daily at 6 PM.  . cloNIDine (CATAPRES) 0.1 MG tablet Take 0.1 mg by mouth 2 (two) times daily.  . Eluxadoline (VIBERZI) 100 MG TABS Take 100 mg by mouth 2 (two) times daily.  . ferrous  sulfate 325 (65 FE) MG tablet Take 1 tablet (325 mg total) by mouth daily with breakfast.  . hydrochlorothiazide (MICROZIDE) 12.5 MG capsule Take 12.5 mg by mouth daily.  Marland Kitchen LANTUS SOLOSTAR 100 UNIT/ML Solostar Pen Inject 15 Units into the skin every morning.  Marland Kitchen losartan (COZAAR) 50 MG tablet Take 1 tablet (50 mg total) by mouth daily.  . metoprolol tartrate (LOPRESSOR) 25 MG tablet Take 1 tablet (25 mg total) by mouth 2 (two) times daily.  . sodium bicarbonate 650 MG tablet Take 1 tablet (650 mg total) by mouth 2 (two) times daily.   FAMILY HISTORY:  His indicated that his mother is deceased. He indicated that his father is deceased. He indicated that the status of his brother is unknown. He indicated that his maternal grandmother is deceased. He indicated that his maternal grandfather is deceased. He indicated that his paternal grandmother is deceased. He indicated that his paternal grandfather is deceased. He indicated that the status of his neg hx is unknown.  SOCIAL HISTORY: He  reports that he quit smoking about 39 years ago. His smoking use included cigarettes. He has a 10.00 pack-year smoking history. he has never used smokeless tobacco. He reports that he drinks alcohol. He reports that he uses drugs. Drug: Heroin.  REVIEW OF SYSTEMS:   Review of Systems  Unable to perform ROS: Mental status change   SUBJECTIVE:  AMS, lying on ER stretcher  VITAL SIGNS: BP (!) 198/90 (BP Location: Left Arm)   Pulse (!) 111  Temp 99.4 F (37.4 C) (Rectal)   Resp (!) 26   Ht 5\' 8"  (1.727 m)   Wt 74.4 kg (164 lb)   SpO2 98%   BMI 24.94 kg/m   VENTILATOR SETTINGS:  PRVC, R 20, Vt 550, PEEP 5, FIO2 100%  INTAKE / OUTPUT: No intake/output data recorded.  PHYSICAL EXAMINATION: General: Elderly male, appears older than stated age, critically ill Neuro: Pupils 67mm equal and reactive b/l, no response to sternal rub. Jaw clenched shut. Not spontaneously moving arms or legs on my initial exam. On  repeat exam following intubation, patient is having decorticate posturing of his BUE HEENT: poor dentition, MM moist Cardiovascular: RRR no m/r/g, extremely hypertensive Lungs: CTA b/l Abdomen: Soft NTND, BS hypoactive Musculoskeletal: no LE edema Skin: no rashes  LABS:  BMET Recent Labs  Lab 04/11/17 0140  NA 135  K 3.5  CL 98*  CO2 21*  BUN 21*  CREATININE 2.71*  GLUCOSE 715*   Electrolytes Recent Labs  Lab 04/11/17 0140  CALCIUM 8.4*   CBC Recent Labs  Lab 04/11/17 0140  WBC 5.5  HGB 11.0*  HCT 33.5*  PLT 214   Coag's Recent Labs  Lab 04/11/17 0140  INR 0.91   Sepsis Markers No results for input(s): LATICACIDVEN, PROCALCITON, O2SATVEN in the last 168 hours.  ABG Recent Labs  Lab 04/11/17 0521  PHART 7.431  PCO2ART 34.8  PO2ART 82.0*   Liver Enzymes Recent Labs  Lab 04/11/17 0140  AST 52*  ALT 23  ALKPHOS 119  BILITOT 0.5  ALBUMIN 2.8*   Cardiac Enzymes No results for input(s): TROPONINI, PROBNP in the last 168 hours.  Glucose Recent Labs  Lab 04/11/17 0132 04/11/17 0334 04/11/17 0500  GLUCAP >600* >600* >600*   Imaging Ct Head Wo Contrast  Result Date: 04/11/2017 CLINICAL DATA:  Altered mental status with seizure EXAM: CT HEAD WITHOUT CONTRAST CT CERVICAL SPINE WITHOUT CONTRAST TECHNIQUE: Multidetector CT imaging of the head and cervical spine was performed following the standard protocol without intravenous contrast. Multiplanar CT image reconstructions of the cervical spine were also generated. COMPARISON:  CT head and C-spine 07/21/2016 FINDINGS: CT HEAD FINDINGS Brain: No acute territorial infarction, hemorrhage, or intracranial mass is seen. Hypodensity within the periventricular white matter consistent with small-vessel ischemic changes. Stable ventricle size. Slight prominence of extra-axial CSF density at the convexities, could be due to atrophy or tiny chronic subdural effusions. Vascular: No hyperdense vessels. Scattered  calcifications at the carotid siphons. Skull: Normal. Negative for fracture or focal lesion. Sinuses/Orbits: Old fracture medial wall left orbit. Mild sinus mucosal disease in the ethmoid sinuses. Small mucous retention cyst right maxillary sinus. No acute orbital abnormality. Other: None CT CERVICAL SPINE FINDINGS Alignment: Straightening of the cervical spine. No subluxation. Facet alignment within normal limits. Skull base and vertebrae: No acute fracture. No primary bone lesion or focal pathologic process. Soft tissues and spinal canal: No prevertebral fluid or swelling. No visible canal hematoma. Disc levels:  Moderate degenerative changes at C5-C6 and C6-C7. Upper chest: Negative. Other: None IMPRESSION: 1. No CT evidence for acute intracranial abnormality. Atrophy and small vessel ischemic changes of the white matter. 2. Straightening of the cervical spine. No definite acute osseous abnormality. Electronically Signed   By: Donavan Foil M.D.   On: 04/11/2017 02:29   Ct Cervical Spine Wo Contrast  Result Date: 04/11/2017 CLINICAL DATA:  Altered mental status with seizure EXAM: CT HEAD WITHOUT CONTRAST CT CERVICAL SPINE WITHOUT CONTRAST TECHNIQUE: Multidetector CT imaging of  the head and cervical spine was performed following the standard protocol without intravenous contrast. Multiplanar CT image reconstructions of the cervical spine were also generated. COMPARISON:  CT head and C-spine 07/21/2016 FINDINGS: CT HEAD FINDINGS Brain: No acute territorial infarction, hemorrhage, or intracranial mass is seen. Hypodensity within the periventricular white matter consistent with small-vessel ischemic changes. Stable ventricle size. Slight prominence of extra-axial CSF density at the convexities, could be due to atrophy or tiny chronic subdural effusions. Vascular: No hyperdense vessels. Scattered calcifications at the carotid siphons. Skull: Normal. Negative for fracture or focal lesion. Sinuses/Orbits: Old  fracture medial wall left orbit. Mild sinus mucosal disease in the ethmoid sinuses. Small mucous retention cyst right maxillary sinus. No acute orbital abnormality. Other: None CT CERVICAL SPINE FINDINGS Alignment: Straightening of the cervical spine. No subluxation. Facet alignment within normal limits. Skull base and vertebrae: No acute fracture. No primary bone lesion or focal pathologic process. Soft tissues and spinal canal: No prevertebral fluid or swelling. No visible canal hematoma. Disc levels:  Moderate degenerative changes at C5-C6 and C6-C7. Upper chest: Negative. Other: None IMPRESSION: 1. No CT evidence for acute intracranial abnormality. Atrophy and small vessel ischemic changes of the white matter. 2. Straightening of the cervical spine. No definite acute osseous abnormality. Electronically Signed   By: Donavan Foil M.D.   On: 04/11/2017 02:29   Dg Chest Portable 1 View  Result Date: 04/11/2017 CLINICAL DATA:  Status post seizure. EXAM: PORTABLE CHEST 1 VIEW COMPARISON:  Chest radiograph performed 07/24/2016 FINDINGS: The lungs are well-aerated and clear. There is no evidence of focal opacification, pleural effusion or pneumothorax. The cardiomediastinal silhouette is borderline normal in size. No acute osseous abnormalities are seen. IMPRESSION: No acute cardiopulmonary process seen. Electronically Signed   By: Garald Balding M.D.   On: 04/11/2017 02:07   SIGNIFICANT EVENTS: 2/8: presented to ER following acute encephalopathy, witnessed sz with EMS and 2nd sz in ER, hyperglycemia, and HTN emergency  LINES/TUBES: 2-PIV ETT 2/8 >> OG tube 2/8 >> Condom catheter; Foley ordered  DISCUSSION: 65yoM with hx CVA, DM, HTN, presents to the ER today with acute encephalopathy and seizures. Initially patient was wandering down hallway of apartment and police were called. EMS arrived to find patient in a bathtub initially unresponsive then combative. He had a seizure during this process and was  given versed 5mg . Patient transported to [facility] ER where blood glucose 715 initially and patient with acute encephalopathy. Patient had a head lac that was sutured in the ER. He ha a 2nd seizure in the ER. He continued to be altered combative following this seizure, repeatedly trying to climb out of bed. He received a total of 9mg  Versed and 4mg  Ativan while in the ER. He continued to be agitated and was intubated by ER staff.   ASSESSMENT / PLAN:  PULMONARY 1. Acute hypoxic respiratory failure; Aspiration pneumonia: - intubated in ER for airway protection - ABG post intubation shows no hypercapnea; will decrease FIO2 on vent as tolerated - CXR pre-intubation on my review showed no infiltrates, effusions, or edema.  - Post-intubation CXR now shows new RLL infiltrate, likely from aspiration pneumonia. - Obtain blood and sputum cultures; start Unasyn. Check procalcitonin  CARDIOVASCULAR 1. HTN emergency; hx HTN: - BP 232/118 on presentation to ER - start nicardipine gtt PRN - Goal SBP <180 per Neuro - hold home antihypertensives at this time  RENAL 1. 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 Anemia: - Hgb 11.0, which appears to be above his baseline of Hgb 9.9-10, likely from hemoconcentration.   INFECTIOUS No active issues  ENDOCRINE 1. Nonketotic Hyperglycemia vs DKA - blood glucose 715 on presentation to ER; with normal pH and AG only 16 - asked ER to please start insulin gtt as one not started yet (BG >600 for 5 hours now) - s/p 3L IVF; continue NS gtt - BMP q4hrs  NEUROLOGIC 1. Acute encephalpathy; Seizures; Cocaine abuse; hx CVA: - intubated for airway protection - Head and Cspine CT are unrevealing - concern regarding possible status  epilepticus; Neuro at bedside. EEG ordered.  - these seizures could've been precipitated from patient's hyperglycemia or from his HTN emergency causing PRES or from his known cocaine abuse - plan on Brain MRI following spot EEG. Following MRI will plan on continuous video EEG - continue fosphenytoin and propofol per Neuro - UDS positive for cocaine and benzos  FAMILY  - Updates: no family present in ER at time of my exam - Inter-disciplinary family meet or Palliative Care meeting due by: 04/18/17  60 minutes critical care time  Vernie Murders, MD  Pulmonary and [place] Pager: [phone]  04/11/2017, 5:52 AM

## 2017-04-11 NOTE — Progress Notes (Signed)
CRITICAL VALUE ALERT  Critical Value:  K-3.2   Phos-2.0   Lactic Acid 2.9  Date & Time Notied:04/11/17 @2305  Provider Notified: Dr. Tamala Julian- elink Orders Received/Actions taken: no new orders given

## 2017-04-11 NOTE — H&P (Signed)
History and Physical    Richard Bennett KXF:[id number] DOB: 20-May-1950 DOA: 04/11/2017  PCP: Richard Ebbs, Bennett  Patient coming from: Home.  Chief Complaint: Seizure.  HPI: Richard Bennett is a 67 y.o. male with history of stroke last May 2018, diabetes mellitus, hypertension, polysubstance abuse was brought to the ER after patient's neighbor witnessed patient having seizures.  Per the report patient was wandering around and is nonverbal and later found to have a generalized tonic-clonic seizure.  EMS was called and patient was given Versed.  No further history is available as family is not able to be reached.  Patient is encephalopathic.  ED Course: In the ER patient blood sugar was found to be in 700s anion gap is 16.  CT of the head and C-spine was unremarkable.  Initially in the ER patient was conversant.  Later in the ER patient started having another tonic-clonic seizure for which patient was given Keppra loading dose and also was given Versed and Ativan.  Patient became more encephalopathic and agitated.  I have ordered 2 more milligrams of Ativan.  Patient was very encephalopathic with eyes showing some nystagmus concerning for status epilepticus I consulted neurology.  Fosphenytoin was ordered.  I also requested pulmonary critical care consult.  Patient is found to be markedly hypertensive for which I ordered hydralazine and also labetalol.  Review of Systems: As per HPI, rest all negative.   Past Medical History:  Diagnosis Date  . Chest pain   . Colon polyps    adenomatous  . Hyperlipidemia   . Hypertension   . Refusal of blood transfusions as patient is Jehovah's Witness   . Stroke ([place])   . Type II diabetes mellitus ([place])     Past Surgical History:  Procedure Laterality Date  . APPENDECTOMY    . BACK SURGERY    . LUMBAR DISC SURGERY     "herniated"     reports that he quit smoking about 39 years ago. His smoking use included cigarettes. He has a 10.00 pack-year smoking  history. he has never used smokeless tobacco. He reports that he drinks alcohol. He reports that he uses drugs. Drug: Heroin.  No Known Allergies  Family History  Problem Relation Age of Onset  . Diabetes Mother   . Stroke Mother   . Cancer Father   . Diabetes Brother   . Seizures Brother   . Colon cancer Neg Hx   . Esophageal cancer Neg Hx   . Stomach cancer Neg Hx   . Rectal cancer Neg Hx     Prior to Admission medications   Medication Sig Start Date End Date Taking? Authorizing Provider  amLODipine (NORVASC) 10 MG tablet Take 10 mg by mouth daily.    Richard Bennett  aspirin 325 MG tablet Take 1 tablet (325 mg total) by mouth daily. 07/28/16   Johnson, Clanford Bennett, Bennett  atorvastatin (LIPITOR) 80 MG tablet Take 1 tablet (80 mg total) by mouth daily at 6 PM. 07/27/16   Johnson, Clanford Bennett, Bennett  cloNIDine (CATAPRES) 0.1 MG tablet Take 0.1 mg by mouth 2 (two) times daily.    Richard Bennett  Eluxadoline (VIBERZI) 100 MG TABS Take 100 mg by mouth 2 (two) times daily.    Richard Bennett  ferrous sulfate 325 (65 FE) MG tablet Take 1 tablet (325 mg total) by mouth daily with breakfast. 07/28/16   Johnson, Clanford Bennett, Bennett  hydrochlorothiazide (MICROZIDE) 12.5 MG capsule Take 12.5 mg by mouth daily.  Richard Bennett  LANTUS SOLOSTAR 100 UNIT/ML Solostar Pen Inject 15 Units into the skin every morning. 8/54/62   Richard Fuel, Bennett  losartan (COZAAR) 50 MG tablet Take 1 tablet (50 mg total) by mouth daily. 09/03/48   Richard Fuel, Bennett  metoprolol tartrate (LOPRESSOR) 25 MG tablet Take 1 tablet (25 mg total) by mouth 2 (two) times daily. 07/27/16   Johnson, Clanford Bennett, Bennett  sodium bicarbonate 650 MG tablet Take 1 tablet (650 mg total) by mouth 2 (two) times daily. 07/27/16   Richard Iba, Bennett    Physical Exam: Vitals:   04/11/17 0345 04/11/17 0415 04/11/17 0500 04/11/17 0505  BP: (!) 220/113 (!) 232/118  (!) 248/118  Pulse: 91 92    Resp: (!) 26 (!) 23      Temp:      TempSrc:      SpO2: 95% 99%    Weight:   74.4 kg (164 lb)   Height:   5\' 8"  (1.727 m)       Constitutional: Moderately built and nourished. Vitals:   04/11/17 0345 04/11/17 0415 04/11/17 0500 04/11/17 0505  BP: (!) 220/113 (!) 232/118  (!) 248/118  Pulse: 91 92    Resp: (!) 26 (!) 23    Temp:      TempSrc:      SpO2: 95% 99%    Weight:   74.4 kg (164 lb)   Height:   5\' 8"  (1.727 m)    Eyes: Anicteric no pallor.  ENMT: No discharge from the ears eyes nose or mouth.  Laceration on the scalp.  Which is sutured. Neck: No mass palpated no neck rigidity.  No JVD appreciated. Respiratory: No rhonchi or crepitations. Cardiovascular: S1-S2 heard no murmurs. Abdomen: Soft nontender bowel sounds present. Musculoskeletal: No edema. Skin: Laceration on the scalp. Neurologic: The patient is encephalopathic does not follow commands.  Has nystagmus with pupils reacting to light. Psychiatric: The patient is encephalopathic.   Labs on Admission: I have personally reviewed following labs and imaging studies  CBC: Recent Labs  Lab 04/11/17 0140  WBC 5.5  HGB 11.0*  HCT 33.5*  MCV 81.7  PLT 093   Basic Metabolic Panel: Recent Labs  Lab 04/11/17 0140  NA 135  K 3.5  CL 98*  CO2 21*  GLUCOSE 715*  BUN 21*  CREATININE 2.71*  CALCIUM 8.4*   GFR: Estimated Creatinine Clearance: 25.9 mL/min (A) (by C-G formula based on SCr of 2.71 mg/dL (H)). Liver Function Tests: Recent Labs  Lab 04/11/17 0140  AST 52*  ALT 23  ALKPHOS 119  BILITOT 0.5  PROT 5.6*  ALBUMIN 2.8*   No results for input(s): LIPASE, AMYLASE in the last 168 hours. Recent Labs  Lab 04/11/17 0141  AMMONIA 52*   Coagulation Profile: Recent Labs  Lab 04/11/17 0140  INR 0.91   Cardiac Enzymes: Recent Labs  Lab 04/11/17 0140  CKTOTAL 97   BNP (last 3 results) No results for input(s): PROBNP in the last 8760 hours. HbA1C: No results for input(s): HGBA1C in the last 72  hours. CBG: Recent Labs  Lab 04/11/17 0132 04/11/17 0334 04/11/17 0500  GLUCAP >600* >600* >600*   Lipid Profile: No results for input(s): CHOL, HDL, LDLCALC, TRIG, CHOLHDL, LDLDIRECT in the last 72 hours. Thyroid Function Tests: No results for input(s): TSH, T4TOTAL, FREET4, T3FREE, THYROIDAB in the last 72 hours. Anemia Panel: No results for input(s): VITAMINB12, FOLATE, FERRITIN, TIBC, IRON, RETICCTPCT in the last 72 hours. Urine  analysis:    Component Value Date/Time   COLORURINE STRAW (A) 07/21/2016 0055   APPEARANCEUR CLEAR 07/21/2016 0055   LABSPEC 1.014 07/21/2016 0055   PHURINE 7.0 07/21/2016 0055   GLUCOSEU >=500 (A) 07/21/2016 0055   HGBUR SMALL (A) 07/21/2016 0055   BILIRUBINUR NEGATIVE 07/21/2016 0055   KETONESUR NEGATIVE 07/21/2016 0055   PROTEINUR >=300 (A) 07/21/2016 0055   NITRITE NEGATIVE 07/21/2016 0055   LEUKOCYTESUR NEGATIVE 07/21/2016 0055   Sepsis Labs: @LABRCNTIP (procalcitonin:4,lacticidven:4) )No results found for this or any previous visit (from the past 240 hour(s)).   Radiological Exams on Admission: Ct Head Wo Contrast  Result Date: 04/11/2017 CLINICAL DATA:  Altered mental status with seizure EXAM: CT HEAD WITHOUT CONTRAST CT CERVICAL SPINE WITHOUT CONTRAST TECHNIQUE: Multidetector CT imaging of the head and cervical spine was performed following the standard protocol without intravenous contrast. Multiplanar CT image reconstructions of the cervical spine were also generated. COMPARISON:  CT head and C-spine 07/21/2016 FINDINGS: CT HEAD FINDINGS Brain: No acute territorial infarction, hemorrhage, or intracranial mass is seen. Hypodensity within the periventricular white matter consistent with small-vessel ischemic changes. Stable ventricle size. Slight prominence of extra-axial CSF density at the convexities, could be due to atrophy or tiny chronic subdural effusions. Vascular: No hyperdense vessels. Scattered calcifications at the carotid siphons.  Skull: Normal. Negative for fracture or focal lesion. Sinuses/Orbits: Old fracture medial wall left orbit. Mild sinus mucosal disease in the ethmoid sinuses. Small mucous retention cyst right maxillary sinus. No acute orbital abnormality. Other: None CT CERVICAL SPINE FINDINGS Alignment: Straightening of the cervical spine. No subluxation. Facet alignment within normal limits. Skull base and vertebrae: No acute fracture. No primary bone lesion or focal pathologic process. Soft tissues and spinal canal: No prevertebral fluid or swelling. No visible canal hematoma. Disc levels:  Moderate degenerative changes at C5-C6 and C6-C7. Upper chest: Negative. Other: None IMPRESSION: 1. No CT evidence for acute intracranial abnormality. Atrophy and small vessel ischemic changes of the white matter. 2. Straightening of the cervical spine. No definite acute osseous abnormality. Electronically Signed   By: Donavan Foil M.D.   On: 04/11/2017 02:29   Ct Cervical Spine Wo Contrast  Result Date: 04/11/2017 CLINICAL DATA:  Altered mental status with seizure EXAM: CT HEAD WITHOUT CONTRAST CT CERVICAL SPINE WITHOUT CONTRAST TECHNIQUE: Multidetector CT imaging of the head and cervical spine was performed following the standard protocol without intravenous contrast. Multiplanar CT image reconstructions of the cervical spine were also generated. COMPARISON:  CT head and C-spine 07/21/2016 FINDINGS: CT HEAD FINDINGS Brain: No acute territorial infarction, hemorrhage, or intracranial mass is seen. Hypodensity within the periventricular white matter consistent with small-vessel ischemic changes. Stable ventricle size. Slight prominence of extra-axial CSF density at the convexities, could be due to atrophy or tiny chronic subdural effusions. Vascular: No hyperdense vessels. Scattered calcifications at the carotid siphons. Skull: Normal. Negative for fracture or focal lesion. Sinuses/Orbits: Old fracture medial wall left orbit. Mild sinus  mucosal disease in the ethmoid sinuses. Small mucous retention cyst right maxillary sinus. No acute orbital abnormality. Other: None CT CERVICAL SPINE FINDINGS Alignment: Straightening of the cervical spine. No subluxation. Facet alignment within normal limits. Skull base and vertebrae: No acute fracture. No primary bone lesion or focal pathologic process. Soft tissues and spinal canal: No prevertebral fluid or swelling. No visible canal hematoma. Disc levels:  Moderate degenerative changes at C5-C6 and C6-C7. Upper chest: Negative. Other: None IMPRESSION: 1. No CT evidence for acute intracranial abnormality. Atrophy and small vessel  ischemic changes of the white matter. 2. Straightening of the cervical spine. No definite acute osseous abnormality. Electronically Signed   By: Donavan Foil M.D.   On: 04/11/2017 02:29   Dg Chest Portable 1 View  Result Date: 04/11/2017 CLINICAL DATA:  Status post seizure. EXAM: PORTABLE CHEST 1 VIEW COMPARISON:  Chest radiograph performed 07/24/2016 FINDINGS: The lungs are well-aerated and clear. There is no evidence of focal opacification, pleural effusion or pneumothorax. The cardiomediastinal silhouette is borderline normal in size. No acute osseous abnormalities are seen. IMPRESSION: No acute cardiopulmonary process seen. Electronically Signed   By: Garald Balding M.D.   On: 04/11/2017 02:07    EKG: Independently reviewed.  Normal sinus rhythm with LVH and ST-T changes.  ST-T changes likely from LVH.  Assessment/Plan Principal Problem:   Status epilepticus ([place]) Active Problems:   Hypertensive emergency   CKD (chronic kidney disease) stage 3, GFR 30-59 ml/min (H[facility])   Hyperosmolar non-ketotic state in patient with type 2 diabetes mellitus ([place])    1. Seizure with possible status epilepticus -I have consulted neurology and patient was placed in addition to Keppra and fosphenytoin loading dose.  Since patient's condition is deteriorating I have consulted pulmonary  critical care for possible need for intubation.  EEG has been ordered.  Will need MRI brain.  Check urine drug screen.  Further recommendations per neurology. 2. Hyperglycemia with hyperosmolar status with possible DKA -patient has been started on insulin drip.  Follow metabolic panel closely along with CBGs. 3. Hypertensive emergency -for now I have placed patient on hydralazine and as needed labetalol if does not improve will need to be on antihypertensive infusions.  Patient is also placed on clonidine patch. 4. Chronic kidney disease stage III creatinine appears to be at baseline. 5. History of drug abuse. 6. History of previous stroke.   DVT prophylaxis: SCDs. Code Status: Full code. Family Communication: Unable to reach family. Disposition Plan: To be determined. Consults called: Neurology and critical care. Admission status: Inpatient.   Rise Patience Bennett [facility] Pager [phone].  If 7PM-7AM, please contact night-coverage [email or web address] Password [email or web address]  04/11/2017, 5:14 AM

## 2017-04-11 NOTE — ED Notes (Signed)
IV versed given per Dr.Wickline for agitation and altered mental status. Pt continuing to try to climb out of bed, has been incontinent, not following commands appropriately

## 2017-04-11 NOTE — ED Provider Notes (Signed)
I was asked by Dr. Christy Gentles to assist with laceration repair and intubation.  Please see his note for full H&P.   Physical Exam  BP (!) 198/90 (BP Location: Left Arm)   Pulse (!) 111   Temp 99.4 F (37.4 C) (Rectal)   Resp (!) 26   Ht 5\' 8"  (1.727 m)   Wt 74.4 kg (164 lb)   SpO2 98%   BMI 24.94 kg/m    ED Course/Procedures     Procedure Name: Intubation Date/Time: 04/11/2017 5:55 AM Performed by: Montine Circle, PA-C Pre-anesthesia Checklist: Patient identified, Patient being monitored, Emergency Drugs available, Timeout performed and Suction available Oxygen Delivery Method: Non-rebreather mask Preoxygenation: Pre-oxygenation with 100% oxygen Induction Type: IV induction Ventilation: Mask ventilation without difficulty Laryngoscope Size: Glidescope and 3 Grade View: Grade I Tube size: 7.5 mm Number of attempts: 1 Airway Equipment and Method: Video-laryngoscopy Placement Confirmation: ETT inserted through vocal cords under direct vision,  Positive ETCO2,  CO2 detector and Breath sounds checked- equal and bilateral Secured at: 23 cm Tube secured with: ETT holder Dental Injury: Teeth and Oropharynx as per pre-operative assessment      LACERATION REPAIR Performed by: Precious Haws, PA-S Authorized by: Montine Circle Consent: Verbal consent obtained. Risks and benefits: risks, benefits and alternatives were discussed Consent given by: patient Patient identity confirmed: provided demographic data Prepped and Draped in normal sterile fashion Wound explored  Laceration Location: Posterior scalp  Laceration Length: 3 cm  No Foreign Bodies seen or palpated  Anesthesia: local infiltration  Local anesthetic: lidocaine 1% with epinephrine  Anesthetic total: 3 ml  Irrigation method: syringe Amount of cleaning: standard  Skin closure: 4-0 vicryl rapide  Number of sutures: 4  Technique: interrupted  Patient tolerance: Patient tolerated the procedure  well with no immediate complications.             Montine Circle, PA-C 04/11/17 [id number]    Ripley Fraise, MD 04/11/17 [phone]

## 2017-04-11 NOTE — ED Notes (Signed)
PA at bedside for suturing lac to posterior R side of head

## 2017-04-11 NOTE — Consult Note (Signed)
Neurology Consultation  Reason for Consult: Altered mental status, seizures Referring Physician: Dr. Christy Gentles  CC: Altered mental status, seizures  History is obtained from: Chart, EDP  HPI: Richard Bennett is a 67 y.o. 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 No family member at bedside.  According to the chart review, he was found wandering down the hall of his apartment and police were called.  They arrived and found him in his apartment, unresponsive in a bathtub.  There was a witnessed seizure.  EMS was called.  He received 4 mg of Versed IV before being transported to the emergency room.  He was postictal upon arrival in the emergency room.  He had sustained a small laceration on his scalp.  As soon as that was sutured, he had another generalized tonic-clonic seizure lasting about 45 seconds to a minute that resolved spontaneously.  He did receive additional Versed and Ativan IV for that.  He was conversant prior to the seizure in the ER.  There was no focality on his exam noted by the ED providers at that time.  His blood sugar was greater than 600 on the point-of-care machine.  BMP revealed a glucose of 715.  Anion gap 16. The hospitalist team was called for admission for his altered mental status, hyperglycemia.  Upon assessment by the hospitalist, patient continued to be altered and unresponsive.  There was noted nystagmus at that time.  Neurology consultation was obtained for concern for ongoing seizure/status epilepticus. I did not witness any seizure activity during my encounter but the patient remained very drowsy and difficult to awake.  He was unable to provide any history He seems to be neglecting  his right side, had a leftward gaze preference and had off and on what seemed like generalized shivering but no frank generalized tonic clonic activity or focal seizures. His mental status was not improving.  He did receive 1 g of Keppra IV followed by 20 mg/kg of fosphenytoin along with additional doses of benzodiazepine during his ER stay.  His GCS was going down and his breathing was becoming erratic. Decision was made to intubate and admit to the ICU for further treatment.  Upon arrival into the ER, his blood sugar was greater than 778, systolic blood pressure greater than 200s  ROS:  Unable to obtain due to altered mental status.   Past Medical History:  Diagnosis Date  . Chest pain   . Colon polyps    adenomatous  . Hyperlipidemia   . Hypertension   . Refusal of blood transfusions as patient is Jehovah's Witness   . Stroke ([place])   . Type II diabetes mellitus ([facility])     Family History  Problem Relation Age of Onset  . Diabetes Mother   . Stroke Mother   . Cancer Father   . Diabetes Brother   . Seizures Brother   . Colon cancer Neg Hx   . Esophageal cancer Neg Hx   . Stomach cancer Neg Hx   . Rectal cancer Neg Hx    Social History:   reports that he quit smoking about 39 years ago. His smoking use included cigarettes. He has a 10.00 pack-year smoking history. he has never used smokeless tobacco. He reports that  he drinks alcohol. He reports that he uses drugs. Drug: Heroin.  Medications  Current Facility-Administered Medications:  .  LORazepam (ATIVAN) 2 MG/ML injection, , , ,  .  0.9 %  sodium chloride infusion, , Intravenous, Continuous, Rise Patience, MD, Last Rate: 125 mL/hr at 04/11/17 0526 .  cloNIDine (CATAPRES - Dosed in mg/24 hr) patch 0.1 mg, 0.1 mg, Transdermal, Weekly, Rise Patience, MD .  dextrose 5 %-0.45 % sodium chloride infusion, , Intravenous, Continuous, Rise Patience, MD .  insulin regular (NOVOLIN R,HUMULIN R) 100 Units in sodium  chloride 0.9 % 100 mL (1 Units/mL) infusion, , Intravenous, Continuous, Rise Patience, MD .  labetalol (NORMODYNE,TRANDATE) injection 10 mg, 10 mg, Intravenous, Q2H PRN, Rise Patience, MD .  levETIRAcetam (KEPPRA) 1,000 mg in sodium chloride 0.9 % 100 mL IVPB, 1,000 mg, Intravenous, Q12H, Jennelle Human B, NP  Current Outpatient Medications:  .  amLODipine (NORVASC) 10 MG tablet, Take 10 mg by mouth daily., Disp: , Rfl:  .  aspirin 325 MG tablet, Take 1 tablet (325 mg total) by mouth daily., Disp: , Rfl:  .  atorvastatin (LIPITOR) 80 MG tablet, Take 1 tablet (80 mg total) by mouth daily at 6 PM., Disp: , Rfl:  .  cloNIDine (CATAPRES) 0.1 MG tablet, Take 0.1 mg by mouth 2 (two) times daily., Disp: , Rfl:  .  Eluxadoline (VIBERZI) 100 MG TABS, Take 100 mg by mouth 2 (two) times daily., Disp: , Rfl:  .  ferrous sulfate 325 (65 FE) MG tablet, Take 1 tablet (325 mg total) by mouth daily with breakfast., Disp: , Rfl: 3 .  hydrochlorothiazide (MICROZIDE) 12.5 MG capsule, Take 12.5 mg by mouth daily., Disp: , Rfl:  .  LANTUS SOLOSTAR 100 UNIT/ML Solostar Pen, Inject 15 Units into the skin every morning., Disp: 15 mL, Rfl: 3 .  losartan (COZAAR) 50 MG tablet, Take 1 tablet (50 mg total) by mouth daily., Disp: 30 tablet, Rfl: 0 .  metoprolol tartrate (LOPRESSOR) 25 MG tablet, Take 1 tablet (25 mg total) by mouth 2 (two) times daily., Disp: 60 tablet, Rfl: 0 .  sodium bicarbonate 650 MG tablet, Take 1 tablet (650 mg total) by mouth 2 (two) times daily., Disp: , Rfl:   Exam: Current vital signs: BP (!) 198/90 (BP Location: Left Arm)   Pulse (!) 105   Temp 99.4 F (37.4 C) (Rectal)   Resp 19   Ht 5\' 8"  (1.727 m)   Wt 74.4 kg (164 lb)   SpO2 99%   BMI 24.94 kg/m  Vital signs in last 24 hours: Temp:  [97.1 F (36.2 C)-99.4 F (37.4 C)] 99.4 F (37.4 C) (02/08 0144) Pulse Rate:  [88-105] 105 (02/08 0515) Resp:  [13-26] 19 (02/08 0515) BP: (183-248)/(89-120) 198/90 (02/08  0525) SpO2:  [94 %-100 %] 99 % (02/08 0515) Weight:  [74.4 kg (164 lb)] 74.4 kg (164 lb) (02/08 0500) GENERAL: Well-developed well-nourished male, extremely drowsy and difficult to awake.  Seem to be in some respiratory distress. HEENT: Small laceration that is sutured on the right scalp. LUNGS - Clear to auscultation bilaterally with no wheezes CV - S1S2 RRR, no m/r/g, equal pulses bilaterally. ABDOMEN - Soft, nontender, nondistended with normoactive BS Ext: warm, well perfused, intact peripheral pulses, no edema  NEURO:  Mental Status: Extremely drowsy and difficult to awake, followed commands intermittently mostly on the left side in terms of keeping his left arm raised to command. Remained nonverbal throughout the encounter. Cranial Nerves:  Pupils are equal round reactive to light, seem to have leftward gaze preference was able to look to the right but not all the way, no gross facial asymmetry noted. Motor exam: Able to raise left upper extremity against gravity with mild vertical drift.  Unable to lift the right upper extremity on command.  Moved left lower extremity briskly to noxious stimulus.  Moves right lower extremity but less briskly to noxious stimulus. Sensory exam as above Coordination cannot be tested Gait testing was deferred due to his altered mental status  Labs I have reviewed labs in epic and the results pertinent to this consultation are: Urine toxicology positive for benzodiazepines and cocaine   CBC    Component Value Date/Time   WBC 5.5 04/11/2017 0140   RBC 4.10 (L) 04/11/2017 0140   HGB 11.0 (L) 04/11/2017 0140   HCT 33.5 (L) 04/11/2017 0140   PLT 214 04/11/2017 0140   MCV 81.7 04/11/2017 0140   MCH 26.8 04/11/2017 0140   MCHC 32.8 04/11/2017 0140   RDW 12.7 04/11/2017 0140   LYMPHSABS 1.6 07/27/2016 0318   MONOABS 1.0 07/27/2016 0318   EOSABS 0.1 07/27/2016 0318   BASOSABS 0.0 07/27/2016 0318    CMP     Component Value Date/Time   NA 135  04/11/2017 0140   K 3.5 04/11/2017 0140   CL 98 (L) 04/11/2017 0140   CO2 21 (L) 04/11/2017 0140   GLUCOSE 715 (HH) 04/11/2017 0140   BUN 21 (H) 04/11/2017 0140   CREATININE 2.71 (H) 04/11/2017 0140   CALCIUM 8.4 (L) 04/11/2017 0140   PROT 5.6 (L) 04/11/2017 0140   ALBUMIN 2.8 (L) 04/11/2017 0140   AST 52 (H) 04/11/2017 0140   ALT 23 04/11/2017 0140   ALKPHOS 119 04/11/2017 0140   BILITOT 0.5 04/11/2017 0140   GFRNONAA 23 (L) 04/11/2017 0140   GFRAA 27 (L) 04/11/2017 0140    Lipid Panel     Component Value Date/Time   CHOL 173 07/25/2016 0438   TRIG 208 (H) 07/25/2016 0438   HDL 33 (L) 07/25/2016 0438   CHOLHDL 5.2 07/25/2016 0438   VLDL 42 (H) 07/25/2016 0438   LDLCALC 98 07/25/2016 0438   Imaging I have reviewed the images obtained:  CT-scan of the brain -no acute changes.   Assessment:  67 year old man with past history of uncontrolled diabetes, left MCA stroke with unknown residual deficit, seizures in the past that were provoked in the setting of uncontrolled hyperglycemia currently not on any antiepileptics, possible history of drug abuse with a positive drug screen for cocaine, brought into the emergency room for evaluation of altered mental status and seizures. He had one witnessed seizure by the EMS/police who had initially seen him. He had another seizure in the emergency room that lasted about 45 seconds to 1 minute. In between those seizures, according to the ER, he was conversant but after the second seizures he continues to remain altered and has been altered at least for about 2-3 hours. His respiratory status was declining and so was his level of consciousness and he had to be emergently intubated. There is concern for ongoing seizure activity versus status epilepticus. I think this is seizures and status epilepticus in the setting of uncontrolled hyperglycemia with blood sugars in the 700s as well as cocaine abuse. He was also extremely hypertensive, and  this episode could reflect hypertensive emergency.  Impression: -Seizures and status epilepticus -Cocaine abuse -Multifactorial encephalopathy likely secondary to drug abuse, uncontrolled hyperglycemia, uncontrolled hypertension. -  Evaluate for stroke -Evaluate for posterior reversible encephalopathy syndrome -Hyperglycemia -Hypertensive emergency  Recommendations: Patient received Keppra 1 g IV x1 and fosphenytoin 20 mg/kg x1. He is currently being intubated.  He will be started on propofol after intubation. Continue Keppra 1 g twice daily. Continue with propofol for now. Stat EEG followed by long-term EEG.  I will inform the EEG technologists to come in for the stat EEG. Maintain seizure precautions MRI of the brain when stable and able. Management of hypertension per primary team. Plan communicated with the critical care team provider as well as ED provider at patient's bedside.  -- Amie Portland, MD [facility] Pager: [phone] If 7pm to 7am, please call on call as listed on [facility].  CRITICAL CARE ATTESTATION This patient is critically ill and at significant risk of neurological worsening, death and care requires constant monitoring of vital signs, hemodynamics,respiratory and cardiac monitoring. I spent 40  minutes of neurocritical care time performing neurological assessment, discussion with family, other specialists and medical decision making of high complexityin the care of  this patient.

## 2017-04-11 NOTE — Progress Notes (Signed)
UOP dropping off over afternoon and evening, now 37cc/hr. Will start LR @ 75cc/hr. Had hypokalemia earlier today and was repleted. Recheck electrolytes now.

## 2017-04-11 NOTE — Progress Notes (Signed)
[facility]  Blood sugar improved Stop insulin drip Start lantus, start q4h regular insulin  Roselie Awkward, MD [facility] Pager: [phone] Cell: [phone] After 3pm or if no response, call [phone]

## 2017-04-11 NOTE — Progress Notes (Signed)
IV Versed bag given to [name], unit pharmacist.

## 2017-04-11 NOTE — Progress Notes (Addendum)
Initial Nutrition Assessment  DOCUMENTATION CODES:   Not applicable  INTERVENTION:    Vital AF 1.2 at 70 ml/h (1680 ml per day)  Provides 2016 kcal, 126 gm protein, 1362 ml free water daily  NUTRITION DIAGNOSIS:   Inadequate oral intake related to inability to eat as evidenced by NPO status.  GOAL:   Patient will meet greater than or equal to 90% of their needs  MONITOR:   Vent status, Labs, I & O's  REASON FOR ASSESSMENT:   Ventilator, Consult(verbal) Enteral/tube feeding initiation and management  ASSESSMENT:   67 yo male with PMH of HTN, HLD, Jehovah's witness, DM-2, stroke who was admitted on 2/8 with seizures, HTN emergency, nonketotic hyperglycemia. Required intubation on admission.  Patient is currently intubated on ventilator support MV: 10.7 L/min Temp (24hrs), Avg:99.9 F (37.7 C), Min:97.1 F (36.2 C), Max:102.9 F (39.4 C)  Propofol: off Labs reviewed. Sodium 146 (H), potassium 2.9 (L) CBG's: [phone] Medications reviewed and include KCl, thiamine. Per discussion with MD, okay to begin TF, RD to order.  NUTRITION - FOCUSED PHYSICAL EXAM:    Most Recent Value  Orbital Region  No depletion  Upper Arm Region  No depletion  Thoracic and Lumbar Region  Unable to assess  Buccal Region  Unable to assess  Temple Region  No depletion  Clavicle Bone Region  No depletion  Clavicle and Acromion Bone Region  No depletion  Scapular Bone Region  Unable to assess  Dorsal Hand  No depletion  Patellar Region  No depletion  Anterior Thigh Region  No depletion  Posterior Calf Region  No depletion  Edema (RD Assessment)  None  Hair  Reviewed  Eyes  Unable to assess  Mouth  Unable to assess  Skin  Reviewed  Nails  Reviewed       Diet Order:  Diet NPO time specified Seizure precautions  EDUCATION NEEDS:   No education needs have been identified at this time  Skin:  Skin Assessment: Reviewed RN Assessment  Last BM:  PTA  Height:   Ht Readings  from Last 1 Encounters:  04/11/17 5\' 8"  (1.727 m)    Weight:   Wt Readings from Last 1 Encounters:  04/11/17 152 lb 12.5 oz (69.3 kg)    Ideal Body Weight:  70 kg  BMI:  Body mass index is 23.23 kg/m.  Estimated Nutritional Needs:   Kcal:  2000  Protein:  100-120 gm  Fluid:  2 L    Molli Barrows, RD, LDN, CNSC Pager [phone] After Hours Pager [phone]

## 2017-04-11 NOTE — ED Notes (Addendum)
Pt having full body convulsion lasting ~45 seconds; oxygen sats decreased to 93%, no urinary incontinence or oral trauma; pt postical at present

## 2017-04-11 NOTE — Progress Notes (Signed)
STAT spot EEG canceled per Dr Leonel Ramsay. LTM EEG started. Due to being moved from ED and MRI pt was not able to start EEG hookup until apx 0915

## 2017-04-11 NOTE — ED Triage Notes (Signed)
  Patient brought in via EMS for AMS and seizures.  Patient was wandering down the hallway of his apartment and [employer] was called.  EMS arrived to find patient in bathtub initially unresponsive then combative.  Patient had seizure during this process and was given 5mg  IM versed and was transported.  CBG read >600 via EMS.  Patient still has AMS and requires NRB for respiratory support.

## 2017-04-11 NOTE — Plan of Care (Signed)
  Progressing Clinical Measurements: Ability to maintain clinical measurements within normal limits will improve 04/11/2017 2138 - Progressing by Randal Buba, RN Will remain free from infection 04/11/2017 2138 - Progressing by Randal Buba, RN Diagnostic test results will improve 04/11/2017 2138 - Progressing by Randal Buba, RN Respiratory complications will improve 04/11/2017 2138 - Progressing by Randal Buba, RN Cardiovascular complication will be avoided 04/11/2017 2138 - Progressing by Randal Buba, RN Activity: Risk for activity intolerance will decrease 04/11/2017 2138 - Progressing by Randal Buba, RN Nutrition: Adequate nutrition will be maintained 04/11/2017 2138 - Progressing by Randal Buba, RN Coping: Level of anxiety will decrease 04/11/2017 2138 - Progressing by Randal Buba, RN Elimination: Will not experience complications related to bowel motility 04/11/2017 2138 - Progressing by Randal Buba, RN Will not experience complications related to urinary retention 04/11/2017 2138 - Progressing by Randal Buba, RN Pain Managment: General experience of comfort will improve 04/11/2017 2138 - Progressing by Randal Buba, RN Safety: Ability to remain free from injury will improve 04/11/2017 2138 - Progressing by Randal Buba, RN Skin Integrity: Risk for impaired skin integrity will decrease 04/11/2017 2138 - Progressing by Randal Buba, RN

## 2017-04-12 ENCOUNTER — Other Ambulatory Visit: Payer: Self-pay

## 2017-04-12 ENCOUNTER — Inpatient Hospital Stay (HOSPITAL_COMMUNITY): Payer: Medicare HMO

## 2017-04-12 DIAGNOSIS — G9340 Encephalopathy, unspecified: Secondary | ICD-10-CM

## 2017-04-12 DIAGNOSIS — N179 Acute kidney failure, unspecified: Secondary | ICD-10-CM

## 2017-04-12 DIAGNOSIS — J9601 Acute respiratory failure with hypoxia: Secondary | ICD-10-CM

## 2017-04-12 LAB — BASIC METABOLIC PANEL
ANION GAP: 8 (ref 5–15)
BUN: 27 mg/dL — ABNORMAL HIGH (ref 6–20)
CHLORIDE: 114 mmol/L — AB (ref 101–111)
CO2: 23 mmol/L (ref 22–32)
Calcium: 7.8 mg/dL — ABNORMAL LOW (ref 8.9–10.3)
Creatinine, Ser: 3.3 mg/dL — ABNORMAL HIGH (ref 0.61–1.24)
GFR calc Af Amer: 21 mL/min — ABNORMAL LOW (ref >60)
GFR calc non Af Amer: 18 mL/min — ABNORMAL LOW (ref >60)
Glucose, Bld: 235 mg/dL — ABNORMAL HIGH (ref 65–99)
POTASSIUM: 3.7 mmol/L (ref 3.5–5.1)
Sodium: 145 mmol/L (ref 135–145)

## 2017-04-12 LAB — GLUCOSE, CAPILLARY
GLUCOSE-CAPILLARY: 121 mg/dL — AB (ref 65–99)
Glucose-Capillary: 91 mg/dL (ref 65–99)
Glucose-Capillary: 236 mg/dL — ABNORMAL HIGH (ref 65–99)
Glucose-Capillary: 266 mg/dL — ABNORMAL HIGH (ref 65–99)
Glucose-Capillary: 121 mg/dL — ABNORMAL HIGH (ref 65–99)

## 2017-04-12 LAB — PHENYTOIN LEVEL, TOTAL: Phenytoin Lvl: 18.7 ug/mL (ref 10.0–20.0)

## 2017-04-12 MED ORDER — POTASSIUM PHOSPHATES 15 MMOLE/5ML IV SOLN
15.0000 mmol | Freq: Once | INTRAVENOUS | Status: AC
  Administered 2017-04-12: 15 mmol via INTRAVENOUS
  Filled 2017-04-12: qty 5

## 2017-04-12 MED ORDER — SODIUM CHLORIDE 0.9 % IV SOLN
500.0000 mg | Freq: Two times a day (BID) | INTRAVENOUS | Status: DC
  Administered 2017-04-12 – 2017-04-18 (×12): 500 mg via INTRAVENOUS
  Filled 2017-04-12 (×15): qty 5

## 2017-04-12 MED ORDER — SODIUM CHLORIDE 0.9 % IV SOLN
5.0000 mg/kg | Freq: Once | INTRAVENOUS | Status: AC
  Administered 2017-04-12: 359.5 mg via INTRAVENOUS
  Filled 2017-04-12: qty 7.19

## 2017-04-12 MED ORDER — LORAZEPAM 2 MG/ML IJ SOLN
INTRAMUSCULAR | Status: AC
  Administered 2017-04-12: 2 mg
  Filled 2017-04-12: qty 1

## 2017-04-12 NOTE — Progress Notes (Signed)
Subjective: No Sz on EEG  Exam: Vitals:   04/12/17 0900 04/12/17 1000  BP: (!) 150/86 131/73  Pulse: 75 73  Resp:    Temp:    SpO2: 100% 100%   Gen: In bed, NAD Resp: non-labored breathing, no acute distress Abd: soft, nt  Neuro: MS: Awake, alert, doe snot follow commands.  NK:[id number], blinks to threat from the right, unclear from the left.  Motor: he moves all extremities with good strength, but while I was in the room, he began having left arm extension with subtle clonic activity suspicious for seizure activity. This resolved with ativan.  Sensory:as above.   Pertinent Labs: Cr 3.1  Impression: 67 yo M with seizures in the setting of plolysubstance abuse. I am concerned for the left arm movemetns for simple partial seizure. I will re-introduce dilantin.   Recommendations: 1) Restart dilantin 100mg  tID follwing small load.  2) Continue keppra, reduce dose to 500mg  BID given renal function.  3) continue EEG.   Roland Rack, MD [facility] [phone]  If 7pm- 7am, please page neurology on call as listed in [place].

## 2017-04-12 NOTE — Progress Notes (Signed)
Dr. Nelda Marseille made aware of patients bladder scan and difficulty with catheterizing patient.  Will continue to monitor patient and readdress in the AM.  To notify if condition worsens.

## 2017-04-12 NOTE — Procedures (Signed)
LTM-EEG Report  HISTORY: Continuous video-EEG monitoring performed for 67 year old with L MCA CVA, seizures, presenting in status epilepticus.  ACQUISITION: International 10-20 system for electrode placement; 18 channels with additional eyes linked to ipsilateral ears and EKG. Additional T1-T2 electrodes were used. Continuous video recording obtained.   EEG NUMBER: MEDICATIONS:  Day 1: LEV, MDZ, PRO   DAY #1: from 1015 04/11/17 to 0730 04/12/17  BACKGROUND: An overall low voltage continuous recording with poor spontaneous variability and reactivity. The background consisted of low voltage delta-theta activity bilaterally with sparse superimposed faster frequencies. There was no evidence of a posterior basic rhythm. Sleep architecture was not seen. Reactivity was present.  EPILEPTIFORM/PERIODIC ACTIVITY: none SEIZURES: none EVENTS: none  EKG: no significant arrhythmia  SUMMARY: This was an abnormal continuous video EEG due to a slow, sedated background. There were no epileptiform discharges or seizures seen.

## 2017-04-12 NOTE — Progress Notes (Signed)
PULMONARY / CRITICAL CARE MEDICINE   Name: Richard Bennett MRN: [id number] DOB: [birth year]    ADMISSION DATE:  04/11/2017 CONSULTATION DATE: 04/11/17  REFERRING MD: ER MD  CHIEF COMPLAINT: Seizures, HTN emergency, Nonketotic hyperglycemia  HISTORY OF PRESENT ILLNESS:   67yoM with hx CVA, DM, HTN, presents to the ER today with acute encephalopathy and seizures. Initially patient was wandering down hallway of apartment and police were called. EMS arrived to find patient in a bathtub initially unresponsive then combative. He had a seizure during this process and was given versed 5mg . Patient transported to [facility] ER where blood glucose 715 initially and patient with acute encephalopathy. Patient had a head lac that was sutured in the ER. He ha a 2nd seizure in the ER. He continued to be altered combative following this seizure, repeatedly trying to climb out of bed. He received a total of 9mg  Versed and 4mg  Ativan while in the ER. He continued to be agitated and was intubated by ER staff.   SUBJECTIVE:  Either sedated or agitated  VITAL SIGNS: BP 111/75   Pulse 77   Temp 97.6 F (36.4 C) (Axillary)   Resp 20   Ht 5\' 8"  (1.727 m)   Wt 71.9 kg (158 lb 8.2 oz)   SpO2 100%   BMI 24.10 kg/m   VENTILATOR SETTINGS: Vent Mode: PRVC FiO2 (%):  [40 %-50 %] 40 % Set Rate:  [20 bmp] 20 bmp Vt Set:  [550 mL] 550 mL PEEP:  [5 cmH20] 5 cmH20 Plateau Pressure:  [16 cmH20-18 cmH20] 17 cmH20PRVC, R 20, Vt 550, PEEP 5, FIO2 100%  INTAKE / OUTPUT: I/O last 3 completed shifts: In: 3743.1 [I.V.:1353.6; NG/GT:969.5; IV Piggyback:1420] Out: 1350 [EYCXK:4818]  PHYSICAL EXAMINATION: General: Male sedated on ventilator either agitated or sedated HEENT: Tracheal tube to ventilator PSY: Agitated Neuro: Does not follow commands but seems peripheral to this movement, questionable seizure activity left arm CV: Heart sounds are regular PULM: Coarse rhonchi bilaterally HU:DJSH, non-tender, bsx4 active   Extremities: warm/dry, 1+ edema  Skin: no rashes or lesions \  LABS:  BMET Recent Labs  Lab 04/11/17 1001 04/11/17 1312 04/11/17 2153  NA 145 146* 145  K 3.0* 2.9* 3.2*  CL 111 109 110  CO2 22 23 22   BUN 18 20 22*  CREATININE 2.54* 2.75* 3.10*  GLUCOSE 191* 105* 121*   Electrolytes Recent Labs  Lab 04/11/17 1001 04/11/17 1312 04/11/17 2153  CALCIUM 8.1* 8.6* 8.1*  MG 1.8  --  1.8  PHOS 2.2*  --  2.0*   CBC Recent Labs  Lab 04/11/17 0140  WBC 5.5  HGB 11.0*  HCT 33.5*  PLT 214   Coag's Recent Labs  Lab 04/11/17 0140  INR 0.91   Sepsis Markers Recent Labs  Lab 04/11/17 1001 04/11/17 2153  LATICACIDVEN 4.2* 2.9*  PROCALCITON 0.37  --     ABG Recent Labs  Lab 04/11/17 0521 04/11/17 0625  PHART 7.431 7.414  PCO2ART 34.8 39.4  PO2ART 82.0* 403.0*   Liver Enzymes Recent Labs  Lab 04/11/17 0140  AST 52*  ALT 23  ALKPHOS 119  BILITOT 0.5  ALBUMIN 2.8*   Cardiac Enzymes Recent Labs  Lab 04/11/17 0523 04/11/17 1001  TROPONINI 0.06* 0.11*    Glucose Recent Labs  Lab 04/11/17 1534 04/11/17 1754 04/11/17 1948 04/11/17 2322 04/12/17 0310 04/12/17 0808  GLUCAP 157* 185* 216* 59* 91 236*   Imaging Portable Chest Xray  Result Date: 04/12/2017 CLINICAL DATA:  Endotracheal tube present  EXAM: PORTABLE CHEST 1 VIEW COMPARISON:  Yesterday FINDINGS: Endotracheal tube tip just below the clavicular heads. An orogastric tube reaches the stomach with side port at the GE junction. Improved aeration. No visible effusion or pneumothorax. Borderline heart size. Artifact from EKG leads. IMPRESSION: 1. Unremarkable positioning of endotracheal and orogastric tubes. 2. Improved aeration. Electronically Signed   By: Monte Fantasia M.D.   On: 04/12/2017 07:06   SIGNIFICANT EVENTS: 2/8: presented to ER following acute encephalopathy, witnessed sz with EMS and 2nd sz in ER, hyperglycemia, and HTN emergency 04/12/2017 started on Dilantin per neurology     LINES/TUBES: 2-PIV ETT 2/8 >> OG tube 2/8 >> Condom catheter; Foley ordered  DISCUSSION: 67yoM with hx CVA, DM, HTN, presents to the ER today with acute encephalopathy and seizures. Initially patient was wandering down hallway of apartment and police were called. EMS arrived to find patient in a bathtub initially unresponsive then combative. He had a seizure during this process and was given versed 5mg . Patient transported to [facility] ER where blood glucose 715 initially and patient with acute encephalopathy. Patient had a head lac that was sutured in the ER. He ha a 2nd seizure in the ER. He continued to be altered combative following this seizure, repeatedly trying to climb out of bed. He received a total of 9mg  Versed and 4mg  Ativan while in the ER. He continued to be agitated and was intubated by ER staff.  04/12/2017 he is either agitated or sedated.  ASSESSMENT / PLAN:  PULMONARY 1. Acute hypoxic respiratory failure; Aspiration pneumonia: - intubated in ER for airway protection - CXR pre-intubation on my review showed no infiltrates, effusions, or edema.  - Post-intubation CXR now shows new RLL infiltrate, likely from aspiration pneumonia. - Obtain blood and sputum cultures; start Unasyn.  procalcitonin 0.37 -Wean from ventilator when neurological status improved  CARDIOVASCULAR 1. HTN emergency; hx HTN: -Resolved 04/12/2017 most likely due to sedation and lack of continuing use of cocaine -Continue to monitor blood pressure  RENAL Lab Results  Component Value Date   CREATININE 3.10 (H) 04/11/2017   CREATININE 2.75 (H) 04/11/2017   CREATININE 2.54 (H) 04/11/2017   . Recent Labs  Lab 04/11/17 1001 04/11/17 1312 04/11/17 2153  K 3.0* 2.9* 3.2*    1. 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 Anemia: - Hgb 11.0, which appears to be above his baseline of Hgb 9.9-10, likely from hemoconcentration.   INFECTIOUS Currently on Unasyn for suspected aspiration  ENDOCRINE CBG (last 3)  Recent Labs    04/11/17 2322 04/12/17 0310 04/12/17 0808  GLUCAP 59* 91 236*    1. Nonketotic Hyperglycemia vs DKA - blood glucose 715 on presentation to ER; with normal pH and AG only 16 - ssi  NEUROLOGIC 1. Acute encephalpathy; Seizures; Cocaine abuse; hx CVA: 04/12/2017 suspected seizures - intubated for airway protection - Head and Cspine CT are unrevealing - concern regarding possible status epilepticus; Neuro at bedside. EEG ordered.  - these seizures could've been precipitated from patient's hyperglycemia or from his HTN emergency causing PRES or from his known cocaine abuse - plan on Brain MRI following spot EEG. Following MRI will plan on continuous video EEG - continue fosphenytoin and propofol per Neuro - UDS positive  for cocaine and benzos -04/12/2017 placed on Dilantin per neurology for suspected seizure  FAMILY  - Updates: no family present in ER at time of my exam - Inter-disciplinary family meet or Palliative Care meeting due by: 04/18/17  30 minutes critical care time  [facility] Minor ACNP Maryanna Shape PCCM Pager [phone] till 1 pm If no answer page [phone] 04/12/2017, 11:50 AM  Attending Note:  67 year old male with seizure disorder who presents to [facility] for burst suppression and vent management.  On exam, patient is either very agitated or completely sedate.  I reviewed CXR myself, ETT is in good position.  Will continue to minimize sedation.  Continue full vent support for now.  Discussed with neuro will continue keppra and restart dilantin.  PCCM will continue to follow.  The patient is critically ill with multiple organ systems failure and requires high complexity decision making for assessment  and support, frequent evaluation and titration of therapies, application of advanced monitoring technologies and extensive interpretation of multiple databases.   Critical Care Time devoted to patient care services described in this note is  35  Minutes. This time reflects time of care of this signee Dr Jennet Maduro. This critical care time does not reflect procedure time, or teaching time or supervisory time of PA/NP/Med student/Med Resident etc but could involve care discussion time.  Rush Farmer, M.D. [facility] Pulmonary/Critical Care Medicine. Pager: [phone]. After hours pager: [phone].

## 2017-04-12 NOTE — Progress Notes (Signed)
Bladder scanned pt and scan showed 475 ml of urine in bladder. RN Shea Stakes advised to in-out cath the pt. When doing the in-out cath RN and I noted that the catheter would not enter the opening of the urethra. RN to notify renal MD of situation. Reapplied condom catheter.

## 2017-04-12 NOTE — Progress Notes (Signed)
Bladder scanned. 233cc.

## 2017-04-13 ENCOUNTER — Inpatient Hospital Stay (HOSPITAL_COMMUNITY): Payer: Medicare HMO

## 2017-04-13 LAB — GLUCOSE, CAPILLARY
GLUCOSE-CAPILLARY: 189 mg/dL — AB (ref 65–99)
GLUCOSE-CAPILLARY: 193 mg/dL — AB (ref 65–99)
Glucose-Capillary: 142 mg/dL — ABNORMAL HIGH (ref 65–99)
Glucose-Capillary: 223 mg/dL — ABNORMAL HIGH (ref 65–99)
Glucose-Capillary: 231 mg/dL — ABNORMAL HIGH (ref 65–99)
Glucose-Capillary: 301 mg/dL — ABNORMAL HIGH (ref 65–99)
Glucose-Capillary: 347 mg/dL — ABNORMAL HIGH (ref 65–99)

## 2017-04-13 LAB — BASIC METABOLIC PANEL
Anion gap: 12 (ref 5–15)
BUN: 29 mg/dL — AB (ref 6–20)
CHLORIDE: 112 mmol/L — AB (ref 101–111)
CO2: 20 mmol/L — AB (ref 22–32)
CREATININE: 3.04 mg/dL — AB (ref 0.61–1.24)
Calcium: 7.8 mg/dL — ABNORMAL LOW (ref 8.9–10.3)
GFR calc Af Amer: 23 mL/min — ABNORMAL LOW (ref >60)
GFR calc non Af Amer: 20 mL/min — ABNORMAL LOW (ref >60)
GLUCOSE: 135 mg/dL — AB (ref 65–99)
POTASSIUM: 4.2 mmol/L (ref 3.5–5.1)
Sodium: 144 mmol/L (ref 135–145)

## 2017-04-13 LAB — PHENYTOIN LEVEL, TOTAL: PHENYTOIN LVL: 12.4 ug/mL (ref 10.0–20.0)

## 2017-04-13 LAB — PHOSPHORUS: Phosphorus: 3 mg/dL (ref 2.5–4.6)

## 2017-04-13 LAB — MAGNESIUM: Magnesium: 1.7 mg/dL (ref 1.7–2.4)

## 2017-04-13 MED ORDER — ORAL CARE MOUTH RINSE
15.0000 mL | OROMUCOSAL | Status: DC
  Administered 2017-04-13 – 2017-04-17 (×40): 15 mL via OROMUCOSAL

## 2017-04-13 MED ORDER — LIDOCAINE HCL 2 % EX GEL
1.0000 "application " | Freq: Once | CUTANEOUS | Status: AC
  Administered 2017-04-13: 1 via URETHRAL
  Filled 2017-04-13: qty 5

## 2017-04-13 MED ORDER — INSULIN GLARGINE 100 UNIT/ML ~~LOC~~ SOLN
20.0000 [IU] | Freq: Every day | SUBCUTANEOUS | Status: DC
  Administered 2017-04-13 – 2017-04-14 (×2): 20 [IU] via SUBCUTANEOUS
  Filled 2017-04-13 (×2): qty 0.2

## 2017-04-13 MED ORDER — LABETALOL HCL 5 MG/ML IV SOLN
INTRAVENOUS | Status: AC
  Filled 2017-04-13: qty 4

## 2017-04-13 MED ORDER — SODIUM CHLORIDE 0.9 % IV SOLN
100.0000 mg | Freq: Two times a day (BID) | INTRAVENOUS | Status: DC
  Administered 2017-04-13 – 2017-04-18 (×10): 100 mg via INTRAVENOUS
  Filled 2017-04-13 (×14): qty 10

## 2017-04-13 MED ORDER — LABETALOL HCL 5 MG/ML IV SOLN
10.0000 mg | INTRAVENOUS | Status: AC
  Administered 2017-04-13: 10 mg via INTRAVENOUS

## 2017-04-13 MED ORDER — LORAZEPAM 2 MG/ML IJ SOLN
INTRAMUSCULAR | Status: AC
  Administered 2017-04-13: 2 mg
  Filled 2017-04-13: qty 1

## 2017-04-13 NOTE — Progress Notes (Signed)
Transported PT to CT on vent.   

## 2017-04-13 NOTE — Progress Notes (Signed)
Transported PT to MRI  Returned to unit after PT spiked to 219/108

## 2017-04-13 NOTE — Progress Notes (Signed)
Called by Lexine Baton, RN to attempt to place an 9 french coude, per urology request. Immediate resistance felt upon insertion of cath. RN made aware. Patrici Ranks A

## 2017-04-13 NOTE — Progress Notes (Signed)
PULMONARY / CRITICAL CARE MEDICINE   Name: Richard Bennett MRN: [id number] DOB: 11-12-1950    ADMISSION DATE:  04/11/2017 CONSULTATION DATE: 04/11/17  REFERRING MD: ER MD  CHIEF COMPLAINT: Seizures, HTN emergency, Nonketotic hyperglycemia  HISTORY OF PRESENT ILLNESS:   67yoM with hx CVA, DM, HTN, presents to the ER today with acute encephalopathy and seizures. Initially patient was wandering down hallway of apartment and police were called. EMS arrived to find patient in a bathtub initially unresponsive then combative. He had a seizure during this process and was given versed 5mg . Patient transported to [facility] ER where blood glucose 715 initially and patient with acute encephalopathy. Patient had a head lac that was sutured in the ER. He ha a 2nd seizure in the ER. He continued to be altered combative following this seizure, repeatedly trying to climb out of bed. He received a total of 9mg  Versed and 4mg  Ativan while in the ER. He continued to be agitated and was intubated by ER staff.   SUBJECTIVE:  Much more sedated.  Right hemiparesis.  Scheduled for MRI.  VITAL SIGNS: BP (!) 166/84 (BP Location: Right Arm)   Pulse 79   Temp 98.9 F (37.2 C) (Axillary)   Resp 20   Ht 5\' 8"  (1.727 m)   Wt 76.2 kg (167 lb 15.9 oz)   SpO2 100%   BMI 25.54 kg/m   VENTILATOR SETTINGS: Vent Mode: PRVC FiO2 (%):  [40 %] 40 % Set Rate:  [20 bmp] 20 bmp Vt Set:  [550 mL] 550 mL PEEP:  [5 cmH20] 5 cmH20 Plateau Pressure:  [17 cmH20-21 cmH20] 19 cmH20PRVC, R 20, Vt 550, PEEP 5, FIO2 100%  INTAKE / OUTPUT: I/O last 3 completed shifts: In: 6634.6 [I.V.:3207.4; NG/GT:2520; IV Piggyback:907.2] Out: 875 [Urine:875]  PHYSICAL EXAMINATION: General: More sedated, support on ventilator HEENT: Endotracheal tube in place, NG tube in place, PSY: None available Neuro: Difficult to arouse, currently on fentanyl drip, appears to have right hemiparesis CV: Heart sounds are regular regular rate rhythm blood pressure  133/74 PULM: even/non-labored, lungs bilaterally decreased in the bases OZ:HYQM, non-tender, bsx4 active  Extremities: warm/dry, negative edema  Skin: no rashes or lesions   LABS:  BMET Recent Labs  Lab 04/11/17 2153 04/12/17 1146 04/13/17 0556  NA 145 145 144  K 3.2* 3.7 4.2  CL 110 114* 112*  CO2 22 23 20*  BUN 22* 27* 29*  CREATININE 3.10* 3.30* 3.04*  GLUCOSE 121* 235* 135*   Electrolytes Recent Labs  Lab 04/11/17 1001  04/11/17 2153 04/12/17 1146 04/13/17 0556  CALCIUM 8.1*   < > 8.1* 7.8* 7.8*  MG 1.8  --  1.8  --  1.7  PHOS 2.2*  --  2.0*  --  3.0   < > = values in this interval not displayed.   CBC Recent Labs  Lab 04/11/17 0140  WBC 5.5  HGB 11.0*  HCT 33.5*  PLT 214   Coag's Recent Labs  Lab 04/11/17 0140  INR 0.91   Sepsis Markers Recent Labs  Lab 04/11/17 1001 04/11/17 2153  LATICACIDVEN 4.2* 2.9*  PROCALCITON 0.37  --     ABG Recent Labs  Lab 04/11/17 0521 04/11/17 0625  PHART 7.431 7.414  PCO2ART 34.8 39.4  PO2ART 82.0* 403.0*   Liver Enzymes Recent Labs  Lab 04/11/17 0140  AST 52*  ALT 23  ALKPHOS 119  BILITOT 0.5  ALBUMIN 2.8*   Cardiac Enzymes Recent Labs  Lab 04/11/17 0523 04/11/17 1001  TROPONINI  0.06* 0.11*    Glucose Recent Labs  Lab 04/12/17 0808 04/12/17 1228 04/12/17 1700 04/12/17 1932 04/13/17 0035 04/13/17 0338  GLUCAP 236* 266* 121* 121* 231* 193*   Imaging Dg Chest Port 1 View  Result Date: 04/13/2017 CLINICAL DATA:  Respiratory failure EXAM: PORTABLE CHEST 1 VIEW COMPARISON:  04/12/2017 and prior exams FINDINGS: An endotracheal tube with tip 5.3 cm above the carina, and NG tube entering the stomach again noted. Cardiomegaly identified. There is no evidence of focal airspace disease, pulmonary edema, effusion, or pneumothorax. No acute bony abnormalities are identified. IMPRESSION: Cardiomegaly without evidence of acute cardiopulmonary disease. Electronically Signed   By: Margarette Canada M.D.    On: 04/13/2017 07:53   SIGNIFICANT EVENTS: 2/8: presented to ER following acute encephalopathy, witnessed sz with EMS and 2nd sz in ER, hyperglycemia, and HTN emergency 04/12/2017 started on Dilantin per neurology  04/13/2017 or sedated and now with right hemiparesis for an MRI   LINES/TUBES: 2-PIV ETT 2/8 >> OG tube 2/8 >> Condom catheter; Foley ordered 04/13/2017 Foley placed by urology secondary to urethral stricture  DISCUSSION: 67yoM with hx CVA, DM, HTN, presents to the ER today with acute encephalopathy and seizures. Initially patient was wandering down hallway of apartment and police were called. EMS arrived to find patient in a bathtub initially unresponsive then combative. He had a seizure during this process and was given versed 5mg . Patient transported to [facility] ER where blood glucose 715 initially and patient with acute encephalopathy. Patient had a head lac that was sutured in the ER. He ha a 2nd seizure in the ER. He continued to be altered combative following this seizure, repeatedly trying to climb out of bed. He received a total of 9mg  Versed and 4mg  Ativan while in the ER. He continued to be agitated and was intubated by ER staff.  04/12/2017 he is either agitated or sedated. 04/13/2017 more sedated suspected secondary to Dilantin loading.  He also has some right hemiparesis.  He is scheduled for an MRI per neurology.  ASSESSMENT / PLAN:  PULMONARY 1. Acute hypoxic respiratory failure; Aspiration pneumonia: - intubated in ER for airway protection - CXR pre-intubation on my review showed no infiltrates, effusions, or edema.  - Post-intubation CXR now shows new RLL infiltrate, likely from aspiration pneumonia. - Obtain blood and sputum cultures; start Unasyn.  procalcitonin 0.37 -Wean from ventilator when neurological status improved  CARDIOVASCULAR 1. HTN emergency; hx HTN: -Resolved 04/12/2017 most likely due to sedation and lack of continuing use of cocaine -Continue to  monitor blood pressure -PRN nicardipine  RENAL Lab Results  Component Value Date   CREATININE 3.04 (H) 04/13/2017   CREATININE 3.30 (H) 04/12/2017   CREATININE 3.10 (H) 04/11/2017   . Recent Labs  Lab 04/11/17 2153 04/12/17 1146 04/13/17 0556  K 3.2* 3.7 4.2    1. 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   GASTROINTESTINAL No active issues; NPO; GI prophylaxis 04/13/2017 on tube feedings.  HEMATOLOGIC Recent Labs    04/11/17 0140  HGB 11.0*    1. Anemia: - Hgb 11.0, which appears to be above his baseline of Hgb 9.9-10, likely from hemoconcentration.   INFECTIOUS Currently on Unasyn for suspected aspiration  ENDOCRINE CBG (last 3)  Recent Labs    04/12/17 1932 04/13/17 0035  04/13/17 0338  GLUCAP 121* 231* 193*    1. Nonketotic Hyperglycemia vs DKA, negative for ketones - blood glucose 715 on presentation to ER; with normal pH and AG only 16 - ssi -Lantus increased to 20 units daily on 04/13/2017  NEUROLOGIC 1. Acute encephalpathy; Seizures; Cocaine abuse; hx CVA: 04/12/2017 suspected seizures - intubated for airway protection - Head and Cspine CT are unrevealing - concern regarding possible status epilepticus; Neuro at bedside. EEG ordered.  - these seizures could've been precipitated from patient's hyperglycemia or from his HTN emergency causing PRES or from his known cocaine abuse - plan on Brain MRI following spot EEG. Following MRI will plan on continuous video EEG - continue fosphenytoin and propofol per Neuro - UDS positive for cocaine and benzos -04/12/2017 placed on Dilantin per neurology for suspected seizure -04/13/2017 less responsive questionable secondary to Dilantin loading.  He does not have  some right hemiparesis.  He is to have an MRI performed at the request of neurology.  FAMILY  - Updates: 04/13/2017 no family at bedside. - Inter-disciplinary family meet or Palliative Care meeting due by: 04/18/17  30 minutes critical care time  [facility] Minor ACNP Maryanna Shape PCCM Pager [phone] till 1 pm If no answer page [phone] 04/13/2017, 9:07 AM  Attending Note:  67 year old with Metaline and HTN emergency presenting with seizure and AMS.  Patient was admitted to the ICU intubated post ictal.  On exam this AM, patient is completely unresponsive with no respiratory drive.  I reviewed CXR myself, ETT is in good position.  Will continue full vent support.  Will not wean.  MRI today.  Start TF.  Minimize sedation.  Discussed with PCCM-NP.  The patient is critically ill with multiple organ systems failure and requires high complexity decision making for assessment and support, frequent evaluation and titration of therapies, application of advanced monitoring technologies and extensive interpretation of multiple databases.   Critical Care Time devoted to patient care services described in this note is  35  Minutes. This time reflects time of care of this signee Dr Jennet Maduro. This critical care time does not reflect procedure time, or teaching time or supervisory time of PA/NP/Med student/Med Resident etc but could involve care discussion time.  Rush Farmer, M.D. [facility] Pulmonary/Critical Care Medicine. Pager: [phone]. After hours pager: [phone].

## 2017-04-13 NOTE — Progress Notes (Signed)
Patient escorted to MRI, en route patient started showing signs of seizure activity and SBP > 200.  Patient returned to room and Dr. Leonel Ramsay informed.  Verbal order of 2 ativan and 10 labatolol.  After medicating patient, traveled down to CT for stat head CT.  Patient transferred back to room, post CT, no s/s of distress.  BP 166/88.  EEG to be reapplied for over night.

## 2017-04-13 NOTE — Progress Notes (Signed)
Subjective: No Sz on EEG, including during episode of concern yesterday.   Less awake today.   Exam: Vitals:   04/13/17 0800 04/13/17 0841  BP: (!) 166/84   Pulse: 79   Resp: 20   Temp:  98.9 F (37.2 C)  SpO2: 100%    Gen: In bed, intubated Resp: ventilated.  Abd: soft, nt  Neuro: MS: lethargic, but arousable.  Does not follow commands.  XB:LTJQZ, blinks to threat from the right, unclear from the left.  Motor: he does not move his right arm as well as his left today.  Sensory:as above.    Impression: 67 yo M with seizures in the setting of plolysubstance abuse. I was concerned for the left arm movements for  partial seizure. I am concerned he may be sedated by dilantin this morning, as level yesterday was slightly high. With new right paresis, will get repeat MRI.   Recommendations: 1) d/c dilantin.  2) Continue keppra 500mg  BID 3) can stop EEG with 48 hours no seizures 4) MRI brain 5) will follow.   Roland Rack, MD [facility] [phone]  If 7pm- 7am, please page neurology on call as listed in [place].

## 2017-04-13 NOTE — Progress Notes (Signed)
[place] Progress Note Patient Name: Richard Bennett DOB: 12/09/1950 MRN: [id number]   Date of Service  04/13/2017  HPI/Events of Note  Urinary retention  eICU Interventions  Advised calling team to place caude catheter        Keyante Durio 04/13/2017, 12:12 AM

## 2017-04-13 NOTE — Plan of Care (Signed)
Consulted urology for catheter placement.

## 2017-04-13 NOTE — Procedures (Signed)
LTM-EEG Report  HISTORY: Continuous video-EEG monitoring performed for 67 year old with L MCA CVA, seizures, presenting in status epilepticus.  ACQUISITION: International 10-20 system for electrode placement; 18 channels with additional eyes linked to ipsilateral ears and EKG. Additional T1-T2 electrodes were used. Continuous video recording obtained.   EEG NUMBER: MEDICATIONS:  Day 1: LEV, MDZ, PRO Day 2: LEV, PHT, MDZ, PRO   DAY #1: from 9675 04/11/17 to 0730 04/12/17  BACKGROUND: An overall low voltage continuous recording with poor spontaneous variability and reactivity. The background consisted of low voltage delta-theta activity bilaterally with sparse superimposed faster frequencies. There was no evidence of a posterior basic rhythm. Sleep architecture was not seen. Reactivity was present.  EPILEPTIFORM/PERIODIC ACTIVITY: none SEIZURES: none EVENTS: none  DAY #2: from 0730 04/12/17 to 0720 04/13/17  BACKGROUND: An overall low voltage continuous recording with poor spontaneous variability and reactivity. The background consisted of low voltage delta-theta activity bilaterally with sparse superimposed faster frequencies. There was no evidence of a posterior basic rhythm. Sleep architecture was not seen. Reactivity was present.  EPILEPTIFORM/PERIODIC ACTIVITY: none SEIZURES: none EVENTS: There was one event of bilateral arm movement, restlessness, and agitation. This was associated with an arousal pattern on EEG with no epileptiform activity seen.  EKG: no significant arrhythmia  SUMMARY: This was an abnormal continuous video EEG due to a slow, sedated background; this background was unchanged compared to the previous day's recording. There were no epileptiform discharges or seizures seen. One event of agitation was captured without EEG correlate.

## 2017-04-13 NOTE — Consult Note (Signed)
Urology Consult  CC: Referring physician: Sharia Reeve, MD Reason for referral: Urinary retention and inability to pass Foley catheter.  Procedure: His genitalia was sterilely prepped and draped.  I initially attempted to pass the 92 French flexible cystoscope into the urethra and was able to see a significant urethral stricture just proximal to the fossa navicularis.  I removed the cystoscope and passed a filiform into the bladder.  I then used followers starting at 75 French and progressing up to 89 [place] to dilate the stricture.  It dilated quite easily with no bleeding.  Repeat cystoscopy revealed the stricture had been completely dilated.  The remainder of the urethra was noted to be normal as was the prostatic urethra and bladder.  The cystoscope was removed and an 33 [place] coud catheter was then passed into the bladder with clear urine returning.  This was connected to closed system drainage.  Impression/Assessment: Distal urethral stricture -there was difficulty passing a catheter initially due to the fact that he had a significant distal urethral stricture.  This was dilated with filiforms and followers.  A Foley catheter was placed.  This should be left indwelling until no longer needed urine output monitoring and then can be removed.   Plan: 1.  Maintain Foley catheter until no longer needed for monitoring of urine output. 2.  With his stricture now dilated he will follow-up with me as needed.    History of Present Illness: Richard Bennett is a 67 year old patient who is seen in hospital consultation today for further evaluation and treatment of urinary retention and difficult catheter placement.  He was admitted with status epilepticus and is intubated in the intensive care unit.  He was unable to get any history.  There were no family members available.  There appears to be no past urologic history.  No further history is obtainable.  A bladder scan was performed and he was found  to have 700 cc in his bladder after it was noted he was not having any urine output.  Attempts at passing a Foley catheter were unsuccessful and I was contacted for assistance.  I was told that the catheter would not pass beyond about a centimeter in the urethra.  Past Medical History:  Diagnosis Date  . Chest pain   . Colon polyps    adenomatous  . Hyperlipidemia   . Hypertension   . Refusal of blood transfusions as patient is Jehovah's Witness   . Stroke ([place])   . Type II diabetes mellitus ([place])    Past Surgical History:  Procedure Laterality Date  . APPENDECTOMY    . BACK SURGERY    . LUMBAR DISC SURGERY     "herniated"    Medications:  Scheduled: . chlorhexidine gluconate (MEDLINE KIT)  15 mL Mouth Rinse BID  . heparin  5,000 Units Subcutaneous Q8H  . insulin aspart  0-15 Units Subcutaneous Q4H  . insulin glargine  15 Units Subcutaneous Daily  . mouth rinse  15 mL Mouth Rinse QID  . pantoprazole (PROTONIX) IV  40 mg Intravenous Daily  . potassium chloride  40 mEq Per Tube Once  . potassium chloride  40 mEq Per Tube BID  . thiamine  100 mg Intravenous Daily   Continuous: . ampicillin-sulbactam (UNASYN) IV Stopped (04/13/17 0208)  . feeding supplement (VITAL AF 1.2 CAL) 1,000 mL (04/12/17 2234)  . fentaNYL infusion INTRAVENOUS 250 mcg/hr (04/13/17 0504)  . lactated ringers 75 mL/hr at 04/13/17 0223  . levETIRAcetam Stopped (  04/13/17 0238)  . midazolam (VERSED) infusion Stopped (04/11/17 0932)  . niCARDipine Stopped (04/11/17 0630)  . propofol (DIPRIVAN) infusion Stopped (04/11/17 1115)    Allergies: No Known Allergies  Family History  Problem Relation Age of Onset  . Diabetes Mother   . Stroke Mother   . Cancer Father   . Diabetes Brother   . Seizures Brother   . Colon cancer Neg Hx   . Esophageal cancer Neg Hx   . Stomach cancer Neg Hx   . Rectal cancer Neg Hx     Social History:  reports that he quit smoking about 39 years ago. His smoking use included  cigarettes. He has a 10.00 pack-year smoking history. he has never used smokeless tobacco. He reports that he drinks alcohol. He reports that he uses drugs. Drug: Heroin.  Review of Systems (10 point): Pertinent items are noted in HPI. A comprehensive review of systems was negative except as noted above.  Physical Exam:  Vital signs in last 24 hours: Temp:  [97.2 F (36.2 C)-99.6 F (37.6 C)] 99.6 F (37.6 C) (02/10 0339) Pulse Rate:  [70-89] 74 (02/10 0700) Resp:  [13-22] 20 (02/10 0700) BP: (109-197)/(70-96) 133/74 (02/10 0700) SpO2:  [100 %] 100 % (02/10 0700) FiO2 (%):  [40 %] 40 % (02/10 0600) Weight:  [76.2 kg (167 lb 15.9 oz)] 76.2 kg (167 lb 15.9 oz) (02/10 0500) General appearance: alert and appears stated age Head: Normocephalic, without obvious abnormality, atraumatic Eyes: conjunctivae/corneas clear. EOM's intact.  Oropharynx: moist mucous membranes Neck: supple, symmetrical, trachea midline Resp: normal respiratory effort Cardio: regular rate and rhythm Back: symmetric, no curvature. ROM normal. No CVA tenderness. GI: soft, non-tender; bowel sounds normal; no masses,  no organomegaly  Male genitalia: penis: normal male phallus with no lesions or discharge.Testes: bilaterally descended with no masses or tenderness. no hernias  Extremities: extremities normal, atraumatic, no cyanosis or edema Skin: Skin color normal. No visible rashes or lesions Neurologic: Grossly normal  Laboratory Data:  Recent Labs    04/11/17 0140  WBC 5.5  HGB 11.0*  HCT 33.5*   BMET Recent Labs    04/11/17 2153 04/12/17 1146  NA 145 145  K 3.2* 3.7  CL 110 114*  CO2 22 23  GLUCOSE 121* 235*  BUN 22* 27*  CREATININE 3.10* 3.30*  CALCIUM 8.1* 7.8*   Recent Labs    04/11/17 0140  INR 0.91   No results for input(s): LABURIN in the last 72 hours. Results for orders placed or performed during the hospital encounter of 04/11/17  MRSA PCR Screening     Status: None    Collection Time: 04/11/17  9:18 AM  Result Value Ref Range Status   MRSA by PCR NEGATIVE NEGATIVE Final    Comment:        The GeneXpert MRSA Assay (FDA approved for NASAL specimens only), is one component of a comprehensive MRSA colonization surveillance program. It is not intended to diagnose MRSA infection nor to guide or monitor treatment for MRSA infections. Performed at [facility], [place] [address], [place], [place] [address]   Culture, blood (routine x 2)     Status: None (Preliminary result)   Collection Time: 04/11/17  9:55 AM  Result Value Ref Range Status   Specimen Description BLOOD RIGHT ANTECUBITAL  Final   Special Requests IN PEDIATRIC BOTTLE Blood Culture adequate volume  Final   Culture   Final    NO GROWTH 1 DAY Performed at [facility]  [facility], [place] [address] [place], [place] [address]    Report Status PENDING  Incomplete  Culture, blood (routine x 2)     Status: None (Preliminary result)   Collection Time: 04/11/17 10:06 AM  Result Value Ref Range Status   Specimen Description BLOOD LEFT ANTECUBITAL  Final   Special Requests IN PEDIATRIC BOTTLE Blood Culture adequate volume  Final   Culture   Final    NO GROWTH 1 DAY Performed at [facility], [place] [address] [place], [place] [address]    Report Status PENDING  Incomplete   Creatinine: Recent Labs    04/11/17 0140 04/11/17 0523 04/11/17 1001 04/11/17 1312 04/11/17 2153 04/12/17 1146  CREATININE 2.71* 2.46* 2.54* 2.75* 3.10* 3.30*    Imaging: Ct Head Wo Contrast  Result Date: 04/11/2017 CLINICAL DATA:  Right-sided weakness and posturing EXAM: CT HEAD WITHOUT CONTRAST TECHNIQUE: Contiguous axial images were obtained from the base of the skull through the vertex without intravenous contrast. COMPARISON:  04/11/2017 FINDINGS: Brain: No evidence of acute infarction, hemorrhage, hydrocephalus, extra-axial collection or mass lesion/mass effect. Diffuse atrophic changes are again noted.  Vascular: No hyperdense vessel or unexpected calcification. Skull: Normal. Negative for fracture or focal lesion. Sinuses/Orbits: Sinuses are well aerated. Small mucosal retention cyst is noted within the both the right maxillary antrum and left ethmoid sinuses. No air-fluid level is noted. Other: None IMPRESSION: Chronic atrophic changes without acute abnormality. No significant interval change from the previous exam is noted. Electronically Signed   By: [place] Catalina M.D.   On: 04/11/2017 07:41   Mr Brain Wo Contrast  Result Date: 04/11/2017 CLINICAL DATA:  68 year old male with unexplained altered mental status and seizures. Hyperglycemia. EXAM: MRI HEAD WITHOUT CONTRAST TECHNIQUE: Multiplanar, multiecho pulse sequences of the brain and surrounding structures were obtained without intravenous contrast. COMPARISON:  Head CT without contrast 0707 hr today and earlier. Brain MRI 07/22/2016 FINDINGS: Brain: Bilateral CSF isointense subdural fluid collections are new since May 2018, measuring about 5 millimeters in thickness bilaterally (series 13, image 17, series 16, image 16). No midline shift. Minimal associated hemisphere mass effect. No acute intracranial hemorrhage identified. No restricted diffusion to suggest acute infarction. No midline shift, mass effect, evidence of mass lesion, or ventriculomegaly. Cervicomedullary junction and pituitary are within normal limits. Patchy and confluent bilateral cerebral white matter T2 and FLAIR hyperintensity is stable since 2018. No cortical encephalomalacia or chronic cerebral blood products identified. Deep gray matter nuclei are stable and within normal limits. Brainstem and cerebellum appear negative. No new signal abnormality identified. Vascular: Major intracranial vascular flow voids are stable. Skull and upper cervical spine: Negative visible cervical spine. Normal bone marrow signal. Sinuses/Orbits: Stable and negative. Other: Visible internal auditory  structures appear normal. Mastoids are clear. There is a small volume of fluid layering in the nasopharynx. Scalp and face soft tissues appear negative. IMPRESSION: 1. Small, symmetric bilateral subdural CSF hygromas are new since the 2018 MRI, measuring 5 mm each. No midline shift and minimal associated intracranial mass effect. 2. No acute intracranial hemorrhage. No other acute intracranial abnormality. 3. Chronic nonspecific cerebral white matter signal changes. Electronically Signed   By: Genevie Ann M.D.   On: 04/11/2017 09:57   Portable Chest Xray  Result Date: 04/12/2017 CLINICAL DATA:  Endotracheal tube present EXAM: PORTABLE CHEST 1 VIEW COMPARISON:  Yesterday FINDINGS: Endotracheal tube tip just below the clavicular heads. An orogastric tube reaches the stomach with side port at the GE junction. Improved aeration.  No visible effusion or pneumothorax. Borderline heart size. Artifact from EKG leads. IMPRESSION: 1. Unremarkable positioning of endotracheal and orogastric tubes. 2. Improved aeration. Electronically Signed   By: Monte Fantasia M.D.   On: 04/12/2017 07:06       Lindy Garczynski C 04/13/2017, 7:08 AM

## 2017-04-13 NOTE — Progress Notes (Signed)
Attempted coude cath with 16 fr. catheter, met with immediate resistance, 2-3cm inside of urethra. Made patient's nurse aware of unsuccessful attempt. Richard Bennett A

## 2017-04-14 ENCOUNTER — Inpatient Hospital Stay (HOSPITAL_COMMUNITY): Payer: Medicare HMO

## 2017-04-14 DIAGNOSIS — G40901 Epilepsy, unspecified, not intractable, with status epilepticus: Secondary | ICD-10-CM

## 2017-04-14 LAB — PHOSPHORUS: Phosphorus: 3 mg/dL (ref 2.5–4.6)

## 2017-04-14 LAB — GLUCOSE, CAPILLARY
GLUCOSE-CAPILLARY: 214 mg/dL — AB (ref 65–99)
Glucose-Capillary: 121 mg/dL — ABNORMAL HIGH (ref 65–99)
Glucose-Capillary: 137 mg/dL — ABNORMAL HIGH (ref 65–99)
Glucose-Capillary: 316 mg/dL — ABNORMAL HIGH (ref 65–99)
Glucose-Capillary: 321 mg/dL — ABNORMAL HIGH (ref 65–99)

## 2017-04-14 LAB — CBC WITH DIFFERENTIAL/PLATELET
Basophils Absolute: 0 10*3/uL (ref 0.0–0.1)
Basophils Relative: 0 %
EOS ABS: 0.2 10*3/uL (ref 0.0–0.7)
Eosinophils Relative: 3 %
HEMATOCRIT: 29.6 % — AB (ref 39.0–52.0)
HEMOGLOBIN: 9.3 g/dL — AB (ref 13.0–17.0)
Lymphocytes Relative: 18 %
Lymphs Abs: 1.5 10*3/uL (ref 0.7–4.0)
MCH: 26.6 pg (ref 26.0–34.0)
MCHC: 31.4 g/dL (ref 30.0–36.0)
MCV: 84.6 fL (ref 78.0–100.0)
MONOS PCT: 8 %
Monocytes Absolute: 0.7 10*3/uL (ref 0.1–1.0)
NEUTROS PCT: 71 %
Neutro Abs: 5.9 10*3/uL (ref 1.7–7.7)
Platelets: 154 10*3/uL (ref 150–400)
RBC: 3.5 MIL/uL — ABNORMAL LOW (ref 4.22–5.81)
RDW: 13.9 % (ref 11.5–15.5)
WBC: 8.3 10*3/uL (ref 4.0–10.5)

## 2017-04-14 LAB — MAGNESIUM: Magnesium: 1.9 mg/dL (ref 1.7–2.4)

## 2017-04-14 LAB — BASIC METABOLIC PANEL
Anion gap: 11 (ref 5–15)
BUN: 31 mg/dL — AB (ref 6–20)
CALCIUM: 8.2 mg/dL — AB (ref 8.9–10.3)
CHLORIDE: 115 mmol/L — AB (ref 101–111)
CO2: 22 mmol/L (ref 22–32)
CREATININE: 2.95 mg/dL — AB (ref 0.61–1.24)
GFR calc Af Amer: 24 mL/min — ABNORMAL LOW (ref >60)
GFR calc non Af Amer: 21 mL/min — ABNORMAL LOW (ref >60)
Glucose, Bld: 152 mg/dL — ABNORMAL HIGH (ref 65–99)
Potassium: 4.5 mmol/L (ref 3.5–5.1)
SODIUM: 148 mmol/L — AB (ref 135–145)

## 2017-04-14 LAB — TRIGLYCERIDES: Triglycerides: 86 mg/dL (ref <150)

## 2017-04-14 MED ORDER — INSULIN ASPART 100 UNIT/ML ~~LOC~~ SOLN
0.0000 [IU] | SUBCUTANEOUS | Status: DC
  Administered 2017-04-14 (×2): 15 [IU] via SUBCUTANEOUS
  Administered 2017-04-15 (×3): 7 [IU] via SUBCUTANEOUS
  Administered 2017-04-15: 11 [IU] via SUBCUTANEOUS
  Administered 2017-04-15: 7 [IU] via SUBCUTANEOUS
  Administered 2017-04-15: 4 [IU] via SUBCUTANEOUS
  Administered 2017-04-16: 3 [IU] via SUBCUTANEOUS
  Administered 2017-04-16: 5 [IU] via SUBCUTANEOUS
  Administered 2017-04-16 (×3): 7 [IU] via SUBCUTANEOUS
  Administered 2017-04-17 (×2): 3 [IU] via SUBCUTANEOUS
  Administered 2017-04-18: 4 [IU] via SUBCUTANEOUS
  Administered 2017-04-18: 3 [IU] via SUBCUTANEOUS
  Administered 2017-04-19 (×2): 4 [IU] via SUBCUTANEOUS
  Administered 2017-04-19 (×3): 3 [IU] via SUBCUTANEOUS
  Administered 2017-04-19 – 2017-04-21 (×3): 4 [IU] via SUBCUTANEOUS
  Administered 2017-04-21 (×2): 7 [IU] via SUBCUTANEOUS
  Administered 2017-04-21 (×2): 4 [IU] via SUBCUTANEOUS
  Administered 2017-04-21: 3 [IU] via SUBCUTANEOUS
  Administered 2017-04-21: 4 [IU] via SUBCUTANEOUS
  Administered 2017-04-22: 3 [IU] via SUBCUTANEOUS
  Administered 2017-04-22: 4 [IU] via SUBCUTANEOUS
  Administered 2017-04-22 (×2): 3 [IU] via SUBCUTANEOUS
  Administered 2017-04-22: 7 [IU] via SUBCUTANEOUS
  Administered 2017-04-23: 4 [IU] via SUBCUTANEOUS
  Administered 2017-04-23: 3 [IU] via SUBCUTANEOUS
  Administered 2017-04-23 (×2): 4 [IU] via SUBCUTANEOUS
  Administered 2017-04-23 (×2): 3 [IU] via SUBCUTANEOUS
  Administered 2017-04-24: 4 [IU] via SUBCUTANEOUS
  Administered 2017-04-24 (×2): 3 [IU] via SUBCUTANEOUS
  Administered 2017-04-24 – 2017-04-25 (×7): 4 [IU] via SUBCUTANEOUS
  Administered 2017-04-26: 7 [IU] via SUBCUTANEOUS
  Administered 2017-04-26 – 2017-04-27 (×6): 4 [IU] via SUBCUTANEOUS
  Administered 2017-04-27 (×2): 3 [IU] via SUBCUTANEOUS
  Administered 2017-04-27 – 2017-04-28 (×3): 4 [IU] via SUBCUTANEOUS
  Administered 2017-04-28: 3 [IU] via SUBCUTANEOUS
  Administered 2017-04-28: 7 [IU] via SUBCUTANEOUS
  Administered 2017-04-29 (×3): 3 [IU] via SUBCUTANEOUS
  Administered 2017-04-30: 4 [IU] via SUBCUTANEOUS
  Administered 2017-04-30: 7 [IU] via SUBCUTANEOUS
  Administered 2017-04-30: 4 [IU] via SUBCUTANEOUS
  Administered 2017-04-30 – 2017-05-01 (×2): 7 [IU] via SUBCUTANEOUS
  Administered 2017-05-01 (×2): 4 [IU] via SUBCUTANEOUS
  Administered 2017-05-01: 7 [IU] via SUBCUTANEOUS
  Administered 2017-05-01: 11 [IU] via SUBCUTANEOUS
  Administered 2017-05-01: 4 [IU] via SUBCUTANEOUS
  Administered 2017-05-02: 11 [IU] via SUBCUTANEOUS
  Administered 2017-05-02 (×2): 7 [IU] via SUBCUTANEOUS
  Administered 2017-05-02 (×2): 4 [IU] via SUBCUTANEOUS
  Administered 2017-05-02: 3 [IU] via SUBCUTANEOUS
  Administered 2017-05-03 (×2): 4 [IU] via SUBCUTANEOUS
  Administered 2017-05-03: 15 [IU] via SUBCUTANEOUS
  Administered 2017-05-03: 4 [IU] via SUBCUTANEOUS
  Administered 2017-05-03: 11 [IU] via SUBCUTANEOUS
  Administered 2017-05-03: 7 [IU] via SUBCUTANEOUS
  Administered 2017-05-04: 4 [IU] via SUBCUTANEOUS
  Administered 2017-05-04: 7 [IU] via SUBCUTANEOUS
  Administered 2017-05-04: 4 [IU] via SUBCUTANEOUS
  Administered 2017-05-04: 7 [IU] via SUBCUTANEOUS
  Administered 2017-05-04 (×2): 4 [IU] via SUBCUTANEOUS
  Administered 2017-05-04: 1 [IU] via SUBCUTANEOUS
  Administered 2017-05-05: 4 [IU] via SUBCUTANEOUS

## 2017-04-14 MED ORDER — NICARDIPINE HCL IN NACL 40-0.83 MG/200ML-% IV SOLN
0.0000 mg/h | INTRAVENOUS | Status: DC
  Administered 2017-04-14 – 2017-04-15 (×13): 15 mg/h via INTRAVENOUS
  Administered 2017-04-16: 10 mg/h via INTRAVENOUS
  Administered 2017-04-16 (×7): 15 mg/h via INTRAVENOUS
  Administered 2017-04-17: 7.5 mg/h via INTRAVENOUS
  Administered 2017-04-17: 10 mg/h via INTRAVENOUS
  Administered 2017-04-17: 7.5 mg/h via INTRAVENOUS
  Filled 2017-04-14 (×25): qty 200

## 2017-04-14 MED ORDER — HYDRALAZINE HCL 20 MG/ML IJ SOLN
10.0000 mg | Freq: Four times a day (QID) | INTRAMUSCULAR | Status: DC | PRN
  Administered 2017-04-14 (×2): 10 mg via INTRAVENOUS
  Administered 2017-04-15 – 2017-04-18 (×3): 20 mg via INTRAVENOUS
  Filled 2017-04-14 (×5): qty 1

## 2017-04-14 MED ORDER — LABETALOL HCL 5 MG/ML IV SOLN
5.0000 mg | INTRAVENOUS | Status: DC | PRN
  Administered 2017-04-14 – 2017-04-17 (×5): 5 mg via INTRAVENOUS
  Filled 2017-04-14 (×2): qty 4

## 2017-04-14 MED ORDER — AMLODIPINE BESYLATE 10 MG PO TABS
10.0000 mg | ORAL_TABLET | Freq: Every day | ORAL | Status: DC
  Administered 2017-04-15 – 2017-05-09 (×25): 10 mg
  Filled 2017-04-14 (×26): qty 1

## 2017-04-14 MED ORDER — INSULIN GLARGINE 100 UNIT/ML ~~LOC~~ SOLN
25.0000 [IU] | Freq: Every day | SUBCUTANEOUS | Status: DC
  Administered 2017-04-15 – 2017-04-24 (×10): 25 [IU] via SUBCUTANEOUS
  Filled 2017-04-14 (×10): qty 0.25

## 2017-04-14 NOTE — Progress Notes (Signed)
LTM running.

## 2017-04-14 NOTE — Procedures (Signed)
LTM-EEG Report  HISTORY: Continuous video-EEG monitoring performed for77year old with L MCA CVA, seizures, presenting in status epilepticus. ACQUISITION: International 10-20 system for electrode placement; 18 channels with additional eyes linked to ipsilateral ears and EKG. Additional T1-T2 electrodes were used. Continuous video recording obtained.   EEG NUMBER: MEDICATIONS: Day 1: LEV, MDZ, PRO Day 2: LEV, PHT, MDZ, PRO Day 3: LEV, LCM, MDZ  DAY #1:from 1015 2/8/19to 0730 04/12/17 BACKGROUND: An overalllowvoltage continuous recording with poorspontaneous variability and reactivity. Thebackground consisted oflowvoltagedelta-theta activity bilaterally with sparse superimposed faster frequencies. There was no evidence of a posterior basic rhythm. Sleep architecture was not seen. Reactivity was present. EPILEPTIFORM/PERIODIC ACTIVITY:none SEIZURES:none EVENTS:none  DAY #2:from 0730 2/9/19to 0720 04/13/17 BACKGROUND: An overalllowvoltage continuous recording with poorspontaneous variability and reactivity. Thebackground consisted oflowvoltagedelta-theta activity bilaterally with sparse superimposed faster frequencies. There was no evidence of a posterior basic rhythm. Sleep architecture was not seen. Reactivity was present. EPILEPTIFORM/PERIODIC ACTIVITY:none SEIZURES:none EVENTS:There was one event of bilateral arm movement, restlessness, and agitation. This was associated with an arousal pattern on EEG with no epileptiform activity seen.  DAY #3:from 1030-1314 2/10/19then 1725 04/13/17 to 0730 04/14/17 BACKGROUND: An overalllowvoltage continuous recording with poorspontaneous variability and reactivity. Thebackground consisted oflowvoltagedelta-theta activity bilaterally with sparse superimposed faster frequencies. There was no evidence of a posterior basic rhythm. Sleep architecture was not seen. Reactivity was present. EPILEPTIFORM/PERIODIC  ACTIVITY:none SEIZURES:none EVENTS:none  EKG: no significant arrhythmia  SUMMARY: This wasan abnormal continuous video EEG due to a slow, sedated background; this background was unchanged compared to the previous day's recording. There were no epileptiform discharges or seizures seen.One event of agitation was captured without EEG correlate.

## 2017-04-14 NOTE — Progress Notes (Signed)
LTM EEG report from this morning documents an abnormal continuous video EEG due to a slow, sedated background. The background was unchanged compared to the previous day's recording. There were no epileptiform discharges or seizures seen.One event of agitation was captured without EEG correlate.  Will discontinue LTM EEG. MRI has been ordered for later today.   Electronically signed: Dr. Kerney Elbe

## 2017-04-14 NOTE — Progress Notes (Signed)
*  PRELIMINARY RESULTS* Vascular Ultrasound Carotid Duplex (Doppler) has been completed.  Right: Elevated right common carotid artery velocities, along with plaque morphology suggest hemodynamically significant stenosis versus proximal obstruction. Elevated right internal carotid artery velocities suggest low range 40-59% stenosis, however this is likely due to proximal obstruction given lack of significant plaque morphology.  Left: Elevated common carotid artery velocities, along with significant plaque morphology is suggestive of >50% stenosis. This is consistent with previous findings by CTA and ultrasound. Elevated left internal carotid artery velocities suggest low range 40-59% stenosis, however this is likely due to proximal obstruction given lack of significant plaque morphology.   Right vertebral artery was patent with antegrade flow. Left vertebral artery exhibits atypical waveform, suggestive of possible proximal obstruction.  Left subclavian artery was stenotic. Normal flow hemodynamics were seen in the right subclavian artery.    04/14/2017 6:57 PM Maudry Mayhew, BS, RVT, RDCS, RDMS

## 2017-04-14 NOTE — Progress Notes (Signed)
PULMONARY / CRITICAL CARE MEDICINE   Name: Richard Bennett MRN: [id number] DOB: Aug 08, 1950    ADMISSION DATE:  04/11/2017 CONSULTATION DATE: 04/11/17  REFERRING MD: ER MD  CHIEF COMPLAINT: Seizures, HTN emergency, Nonketotic hyperglycemia  HISTORY OF PRESENT ILLNESS:   39yoM with hx CVA, DM, HTN, presents to the ER today with acute encephalopathy and seizures. Initially patient was wandering down hallway of apartment and police were called. EMS arrived to find patient in a bathtub initially unresponsive then combative. He had a seizure during this process and was given versed 5mg . Patient transported to [facility] ER where blood glucose 715 initially and patient with acute encephalopathy. Patient had a head lac that was sutured in the ER. He ha a 2nd seizure in the ER. He continued to be altered combative following this seizure, repeatedly trying to climb out of bed. He received a total of 9mg  Versed and 4mg  Ativan while in the ER. He continued to be agitated and was intubated by ER staff.   SUBJECTIVE:  Sedation has been off since this morning.  He remains lethargic will open eyes to voice He had another seizure last night when he went down for his MRI, it was deferred. CT scan of his head was done that showed concern for posterior left frontal lobe infarct  VITAL SIGNS: BP (!) 151/77   Pulse 88   Temp 99.8 F (37.7 C) (Oral)   Resp 20   Ht 5\' 8"  (1.727 m)   Wt 78.8 kg (173 lb 11.6 oz)   SpO2 100%   BMI 26.41 kg/m    VENTILATOR SETTINGS: Vent Mode: PRVC FiO2 (%):  [40 %] 40 % Set Rate:  [20 bmp] 20 bmp Vt Set:  [550 mL] 550 mL PEEP:  [5 cmH20] 5 cmH20 Pressure Support:  [12 cmH20] 12 cmH20 Plateau Pressure:  [17 cmH20-21 cmH20] 18 cmH20PRVC, R 20, Vt 550, PEEP 5, FIO2 100%  INTAKE / OUTPUT: I/O last 3 completed shifts: In: 6664.7 [I.V.:3322.1; NG/GT:2437.7; IV VEHMCNOBS:962] Out: 1695 [Urine:1695]  PHYSICAL EXAMINATION: General: Ill-appearing man, mechanically ventilated HEENT:  Endotracheal tube in place, oropharynx clear, no scleral icterus Neuro: Opens eyes to voice, will not track, will not follow commands, no purposeful movement CV: Regular, no murmur, no edema PULM: Clear bilaterally, decreased at both bases GI: Soft, nontender, positive bowel sounds Extremities: No deformity Skin: No rash   LABS:  BMET Recent Labs  Lab 04/12/17 1146 04/13/17 0556 04/14/17 0608  NA 145 144 148*  K 3.7 4.2 4.5  CL 114* 112* 115*  CO2 23 20* 22  BUN 27* 29* 31*  CREATININE 3.30* 3.04* 2.95*  GLUCOSE 235* 135* 152*   Electrolytes Recent Labs  Lab 04/11/17 2153 04/12/17 1146 04/13/17 0556 04/14/17 0608  CALCIUM 8.1* 7.8* 7.8* 8.2*  MG 1.8  --  1.7 1.9  PHOS 2.0*  --  3.0 3.0   CBC Recent Labs  Lab 04/11/17 0140 04/14/17 0608  WBC 5.5 8.3  HGB 11.0* 9.3*  HCT 33.5* 29.6*  PLT 214 154   Coag's Recent Labs  Lab 04/11/17 0140  INR 0.91   Sepsis Markers Recent Labs  Lab 04/11/17 1001 04/11/17 2153  LATICACIDVEN 4.2* 2.9*  PROCALCITON 0.37  --     ABG Recent Labs  Lab 04/11/17 0521 04/11/17 0625  PHART 7.431 7.414  PCO2ART 34.8 39.4  PO2ART 82.0* 403.0*   Liver Enzymes Recent Labs  Lab 04/11/17 0140  AST 52*  ALT 23  ALKPHOS 119  BILITOT 0.5  ALBUMIN 2.8*   Cardiac Enzymes Recent Labs  Lab 04/11/17 0523 04/11/17 1001  TROPONINI 0.06* 0.11*    Glucose Recent Labs  Lab 04/13/17 1726 04/13/17 1923 04/13/17 2340 04/14/17 0427 04/14/17 0717 04/14/17 1130  GLUCAP 347* 301* 189* 121* 137* 214*   Imaging Ct Head Wo Contrast  Result Date: 04/13/2017 CLINICAL DATA:  Focal neuro deficit of less than 6 hours. Stroke suspected. Abnormal left arm movements. Seizures in the setting of polysubstance abuse. EXAM: CT HEAD WITHOUT CONTRAST TECHNIQUE: Contiguous axial images were obtained from the base of the skull through the vertex without intravenous contrast. COMPARISON:  MRI brain and CT head 04/11/2017. FINDINGS: Brain:  Bilateral subdural hygromas are again noted. The extra-axial collection on the right is slightly more prominent than on the previous studies. It now measures 5.8 mm on coronal images compared with 4.9 mm on the CT scan and less than 3 mm on the CT scan from 2 days ago. There is no midline shift. Moderate white matter disease is again noted bilaterally. There is progressive white matter hypoattenuation in the posterior left frontal lobe. No definite cortical abnormality is present. Vascular: Vascular calcifications are present within the cavernous internal carotid arteries. There is no hyperdense vessel. Skull: The calvarium is intact. No focal lytic or blastic lesions are present. Sinuses/Orbits: A remote left orbital blowout fracture is present. A single pacified left ethmoid air cell is present. The paranasal sinuses and mastoid air cells are otherwise clear. The globes and orbits are within normal limits. IMPRESSION: 1. Progressive white matter hypoattenuation in the posterior left frontal lobe is concerning for infarct. Infection is considered less likely. There may be some cortical involvement is well. The changes are mostly subcortical. 2. Stable white matter disease on the right. 3. Bilateral subdural hygromas with increased since the previous CT scan. The above was relayed via text pager to Dr. Roland Rack on 04/13/2017 at 16:55 . Electronically Signed   By: San Morelle M.D.   On: 04/13/2017 16:55   Dg Chest Port 1 View  Result Date: 04/14/2017 CLINICAL DATA:  Respiratory failure EXAM: PORTABLE CHEST 1 VIEW COMPARISON:  Yesterday FINDINGS: Endotracheal tube tip between the clavicular heads and carina. An orogastric tube reaches the stomach which still contains gas. There is no edema, consolidation, effusion, or pneumothorax. Mild streaky opacity behind the heart. Mild cardiomegaly. Carotid metallic foreign body over the right chest. IMPRESSION: 1. Mild streaky retrocardiac opacity  favoring atelectasis. 2. Endotracheal and orogastric tubes in good position. 3. Continued gaseous distention of the stomach. Electronically Signed   By: Monte Fantasia M.D.   On: 04/14/2017 06:49   SIGNIFICANT EVENTS: 2/8: presented to ER following acute encephalopathy, witnessed sz with EMS and 2nd sz in ER, hyperglycemia, and HTN emergency 04/12/2017 started on Dilantin per neurology  04/13/2017 or sedated and now with right hemiparesis for an MRI   LINES/TUBES: 2-PIV ETT 2/8 >> OG tube 2/8 >> Condom catheter; Foley ordered 04/13/2017 Foley placed by urology secondary to urethral stricture  DISCUSSION: 74yoM with hx CVA, DM, HTN, presents to the ER today with acute encephalopathy and seizures. Initially patient was wandering down hallway of apartment and police were called. EMS arrived to find patient in a bathtub initially unresponsive then combative. He had a seizure during this process and was given versed 5mg . Patient transported to [facility] ER where blood glucose 715 initially and patient with acute encephalopathy. Patient had a head lac that was sutured in the ER. He ha a 2nd  seizure in the ER. He continued to be altered combative following this seizure, repeatedly trying to climb out of bed. He received a total of 9mg  Versed and 4mg  Ativan while in the ER. He continued to be agitated and was intubated by ER staff.  04/12/2017 he is either agitated or sedated. 04/13/2017 more sedated suspected secondary to Dilantin loading.  He also has some right hemiparesis.  He is scheduled for an MRI per neurology.  ASSESSMENT / PLAN:  PULMONARY acute hypoxic respiratory failure;  Question aspiration pneumonia: Continue current ventilator support  Not currently in a position to extubate due to altered mental status Follow chest x-ray Empiric antibiotics as below  CARDIOVASCULAR 1. 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   INFECTIOUS Possible aspiration PNA, pneumonitis.   ENDOCRINE Nonketotic Hyperglycemia vs DKA Improved  NEUROLOGIC Acute encephalpathy; Seizures;  Cocaine abuse;  Acute on chronic CVA Appreciate Neurology Assistance MRI brain still pending EEG  AED's as ordered  FAMILY  - Updates: 04/14/2017 no family at bedside. - Inter-disciplinary family meet or Palliative Care meeting due by: 04/18/17  Independent CC time 58 minutes  Baltazar Apo, MD, PhD 04/14/2017, 3:04 PM [facility] [phone] or if no answer [phone]

## 2017-04-14 NOTE — Progress Notes (Signed)
Called EEG technician department without answer, left voicemail regarding patient's leads that have become disconnected. Will continue to try to reach EEG. Modena Morrow E, RN 04/14/2017 9:06 AM

## 2017-04-15 ENCOUNTER — Inpatient Hospital Stay (HOSPITAL_COMMUNITY): Payer: Medicare HMO

## 2017-04-15 DIAGNOSIS — J9600 Acute respiratory failure, unspecified whether with hypoxia or hypercapnia: Secondary | ICD-10-CM

## 2017-04-15 LAB — GLUCOSE, CAPILLARY
Glucose-Capillary: 204 mg/dL — ABNORMAL HIGH (ref 65–99)
Glucose-Capillary: 214 mg/dL — ABNORMAL HIGH (ref 65–99)
Glucose-Capillary: 242 mg/dL — ABNORMAL HIGH (ref 65–99)
Glucose-Capillary: 265 mg/dL — ABNORMAL HIGH (ref 65–99)

## 2017-04-15 LAB — CBC
HCT: 30 % — ABNORMAL LOW (ref 39.0–52.0)
HEMOGLOBIN: 9.3 g/dL — AB (ref 13.0–17.0)
MCH: 26.6 pg (ref 26.0–34.0)
MCHC: 31 g/dL (ref 30.0–36.0)
MCV: 85.7 fL (ref 78.0–100.0)
Platelets: 194 10*3/uL (ref 150–400)
RBC: 3.5 MIL/uL — AB (ref 4.22–5.81)
RDW: 14.2 % (ref 11.5–15.5)
WBC: 7.7 10*3/uL (ref 4.0–10.5)

## 2017-04-15 LAB — BASIC METABOLIC PANEL
ANION GAP: 11 (ref 5–15)
BUN: 41 mg/dL — ABNORMAL HIGH (ref 6–20)
CO2: 20 mmol/L — AB (ref 22–32)
Calcium: 8.1 mg/dL — ABNORMAL LOW (ref 8.9–10.3)
Chloride: 115 mmol/L — ABNORMAL HIGH (ref 101–111)
Creatinine, Ser: 3.19 mg/dL — ABNORMAL HIGH (ref 0.61–1.24)
GFR calc Af Amer: 22 mL/min — ABNORMAL LOW (ref >60)
GFR calc non Af Amer: 19 mL/min — ABNORMAL LOW (ref >60)
GLUCOSE: 281 mg/dL — AB (ref 65–99)
POTASSIUM: 5.7 mmol/L — AB (ref 3.5–5.1)
Sodium: 146 mmol/L — ABNORMAL HIGH (ref 135–145)

## 2017-04-15 LAB — MAGNESIUM: Magnesium: 1.8 mg/dL (ref 1.7–2.4)

## 2017-04-15 MED ORDER — CLONIDINE HCL 0.2 MG PO TABS
0.2000 mg | ORAL_TABLET | Freq: Two times a day (BID) | ORAL | Status: DC
  Administered 2017-04-15 – 2017-05-02 (×35): 0.2 mg
  Filled 2017-04-15 (×36): qty 1

## 2017-04-15 MED ORDER — ATORVASTATIN CALCIUM 40 MG PO TABS
40.0000 mg | ORAL_TABLET | Freq: Every day | ORAL | Status: DC
  Administered 2017-04-15 – 2017-05-12 (×27): 40 mg
  Filled 2017-04-15 (×29): qty 1

## 2017-04-15 MED ORDER — METOPROLOL TARTRATE 25 MG PO TABS
25.0000 mg | ORAL_TABLET | Freq: Two times a day (BID) | ORAL | Status: DC
  Administered 2017-04-15 – 2017-04-16 (×3): 25 mg
  Filled 2017-04-15 (×3): qty 1

## 2017-04-15 MED ORDER — ASPIRIN 325 MG PO TABS
325.0000 mg | ORAL_TABLET | Freq: Every day | ORAL | Status: DC
  Administered 2017-04-15 – 2017-05-13 (×29): 325 mg
  Filled 2017-04-15 (×29): qty 1

## 2017-04-15 MED ORDER — PANTOPRAZOLE SODIUM 40 MG PO PACK
40.0000 mg | PACK | Freq: Every day | ORAL | Status: DC
  Administered 2017-04-16 – 2017-04-29 (×14): 40 mg
  Filled 2017-04-15 (×14): qty 20

## 2017-04-15 NOTE — Progress Notes (Signed)
S:  Patient seen and examined this morning.  His nurse reports that he is relatively unchanged from yesterday.  He remains intubated and sedated on 84mg of Fentanyl.  O: BP (!) 156/77   Pulse 84   Temp 99 F (37.2 C) (Axillary)   Resp 18   Ht _0  (1.727 m)   Wt 181 lb 7 oz (82.3 kg)   SpO2 100%   BMI 27.59 kg/m   Neurological Exam: MS: He is sedated on Fentanyl at 520m, he will open his eyes to voice and withdraw from minor pain stimulation CN: Pupils are equal although sluggish, appears to follow with his eyes, face symmetric. Unable to assess hearing, shoulder shrug, tongue extension, or uvula rise Motor: Does not voluntary move his lower extremities.  Will lift his left arm off the bed on command.  Normal tone and bulk Sensory: Unable to assess  General: resting in bed, intubated and sedated HEENT: Normocephalic, atraumatic, no scleral icterus Cardiac: RRR Pulm: ventilation assisted, normal chest rise and fall Abd: soft, nondistended Ext: warm and well perfused, SCD in place  MRI completed, with the following findings: 1. Small, symmetric bilateral subdural CSF hygromas are new since the 2018 MRI, measuring 5 mm each. No midline shift and minimal associated intracranial mass effect. 2. No acute intracranial hemorrhage. No other acute intracranial abnormality. 3. Chronic nonspecific cerebral white matter signal changes.  LTM EEG completed yesterday, revealing slow, sedated background with no epileptiform discharges or seizures seen.  Carotid ultrasound preliminary report documents atherosclerotic disease without occlusion: Elevated common carotid artery velocities, along with significant plaque morphology is suggestive of >50% stenosis. This is consistent with previous findings by CTA and ultrasound. Elevated left internal carotid artery velocities suggest low range 40-59% stenosis, however this is likely due to proximal obstruction given lack of significant plaque  morphology. Right vertebral artery was patent with antegrade flow. Left vertebral artery exhibits atypical waveform, suggestive of possible proximal obstruction. Left subclavian artery was stenotic. Normal flow hemodynamics were seen in the right subclavian artery.   Current Facility-Administered Medications:  .  amLODipine (NORVASC) tablet 10 mg, 10 mg, Per Tube, Daily, Diallo, Abdoulaye, MD .  Ampicillin-Sulbactam (UNASYN) 3 g in sodium chloride 0.9 % 100 mL IVPB, 3 g, Intravenous, Q12H, MiPriscella MannRPH, Stopped at 04/15/17 0124 .  bisacodyl (DULCOLAX) suppository 10 mg, 10 mg, Rectal, Daily PRN, SiJennelle Human, NP .  chlorhexidine gluconate (MEDLINE KIT) (PERIDEX) 0.12 % solution 15 mL, 15 mL, Mouth Rinse, BID, Simpson, Paula B, NP, 15 mL at 04/15/17 0730 .  docusate (COLACE) 50 MG/5ML liquid 100 mg, 100 mg, Per Tube, BID PRN, SiJennelle Human, NP, 100 mg at 04/14/17 2317 .  feeding supplement (VITAL AF 1.2 CAL) liquid 1,000 mL, 1,000 mL, Per Tube, Continuous, McQuaid, Douglas B, MD, Last Rate: 70 mL/hr at 04/15/17 0056, 1,000 mL at 04/15/17 0056 .  fentaNYL (SUBLIMAZE) bolus via infusion 25 mcg, 25 mcg, Intravenous, Q1H PRN, SiJennelle Human, NP, 25 mcg at 04/14/17 1618 .  fentaNYL 250076min NS 250m24m0mc39m) infusion-PREMIX, 25-400 mcg/hr, Intravenous, Continuous, Simpson, Paula B, NP, Last Rate: 5 mL/hr at 04/15/17 0757, 50 mcg/hr at 04/15/17 0757 .  heparin injection 5,000 Units, 5,000 Units, Subcutaneous, Q8H, SmithMauri Brooklyn 5,000 Units at 04/15/17 0532 .  hydrALAZINE (APRESOLINE) injection 10-20 mg, 10-20 mg, Intravenous, Q6H PRN, ByrumCollene Gobble 10 mg at 04/14/17 1556 .  insulin aspart (novoLOG) injection 0-20 Units, 0-20 Units, Subcutaneous, Q4H, Diallo, Abdoulaye,  MD, 4 Units at 04/15/17 0801 .  insulin glargine (LANTUS) injection 25 Units, 25 Units, Subcutaneous, Daily, Diallo, Abdoulaye, MD .  labetalol (NORMODYNE,TRANDATE) injection 5 mg, 5 mg, Intravenous, Q10 min  PRN, Diallo, Abdoulaye, MD, 5 mg at 04/14/17 2316 .  lacosamide (VIMPAT) 100 mg in sodium chloride 0.9 % 25 mL IVPB, 100 mg, Intravenous, Q12H, Greta Doom, MD, Stopped at 04/14/17 2224 .  lactated ringers infusion, , Intravenous, Continuous, Hammonds, Sharyn Blitz, MD, Last Rate: 75 mL/hr at 04/15/17 0339 .  levETIRAcetam (KEPPRA) 500 mg in sodium chloride 0.9 % 100 mL IVPB, 500 mg, Intravenous, Q12H, Greta Doom, MD, Stopped at 04/15/17 0308 .  MEDLINE mouth rinse, 15 mL, Mouth Rinse, 10 times per day, Simonne Maffucci B, MD, 15 mL at 04/15/17 0536 .  midazolam (VERSED) 50 mg in sodium chloride 0.9 % 50 mL (1 mg/mL) infusion, 0-10 mg/hr, Intravenous, Continuous, Jennelle Human B, NP, Stopped at 04/11/17 0932 .  nicardipine (CARDENE) 35m in 0.83% saline 2071mIV DOUBLE STRENGTH infusion (0.2 mg/ml), 0-15 mg/hr, Intravenous, Continuous, McQuaid, Douglas B, MD, Last Rate: 75 mL/hr at 04/15/17 0730, 15 mg/hr at 04/15/17 0730 .  pantoprazole (PROTONIX) injection 40 mg, 40 mg, Intravenous, Daily, SiJennelle Human, NP, 40 mg at 04/14/17 0936 .  potassium chloride 20 MEQ/15ML (10%) solution 40 mEq, 40 mEq, Per Tube, Once, Hammonds, KaSharyn BlitzMD .  potassium chloride 20 MEQ/15ML (10%) solution 40 mEq, 40 mEq, Per Tube, BID, McSimonne Maffucci, MD, 40 mEq at 04/14/17 2156 .  propofol (DIPRIVAN) 1000 MG/100ML infusion, 0-50 mcg/kg/min, Intravenous, Continuous, SiJennelle Human, NP, Stopped at 04/11/17 1115 .  thiamine (B-1) injection 100 mg, 100 mg, Intravenous, Daily, SiJennelle Human, NP, 100 mg at 04/14/17 097125 Impression/Recommendations: [age]o M with seizures, hypertensive emergency and nonketotic hyperglycemia in the setting of polysubstance abuse.  1. Left arm movements seen previously were concerning for  partial seizure. Continue Keppra 500 mg BID and Vimpat 100 mg BID.  2. Repeat MRI reveals no lesion to explain the new right paresis seen on Dr. KiCecil Cobbsxam from  Sunday. DDx includes Todd's paresis secondary to focal seizure, versus TIA. Recommend initiation of ASA and Lipitor if there is no contraindication. Would also consider obtaining an echocardiogram and an MRA head.  3. Neurology will follow loosely. Call usKoreao discuss test results or significant changes.   AnJule SerPGY3 04/15/2017, 8:58 AM   Patient seen and examined. Impression/Recommendations discussed with Neurology Resident.  Electronically signed: Dr. ErKerney Elbe

## 2017-04-15 NOTE — Progress Notes (Addendum)
PULMONARY / CRITICAL CARE MEDICINE   Name: Richard Bennett MRN: [id number] DOB: [birth year]    ADMISSION DATE:  04/11/2017 CONSULTATION DATE: 04/11/17  REFERRING MD: ER MD  CHIEF COMPLAINT: Seizures, HTN emergency, Nonketotic hyperglycemia  HISTORY OF PRESENT ILLNESS:   16yoM with hx CVA, DM, HTN, presents to the ER today with acute encephalopathy and seizures. Initially patient was wandering down hallway of apartment and police were called. EMS arrived to find patient in a bathtub initially unresponsive then combative. He had a seizure during this process and was given versed 5mg . Patient transported to [facility] ER where blood glucose 715 initially and patient with acute encephalopathy. Patient had a head lac that was sutured in the ER. He ha a 2nd seizure in the ER. He continued to be altered combative following this seizure, repeatedly trying to climb out of bed. He received a total of 9mg  Versed and 4mg  Ativan while in the ER. He continued to be agitated and was intubated by ER staff.   SUBJECTIVE:  Moved R LE off fentanyl but has still never followed any commands.  Back on fentanyl currently   VITAL SIGNS: BP (!) 147/76   Pulse 100   Temp 99 F (37.2 C) (Oral)   Resp (!) 21   Ht 5\' 8"  (1.727 m)   Wt 82.3 kg (181 lb 7 oz)   SpO2 100%   BMI 27.59 kg/m   VENTILATOR SETTINGS: Vent Mode: CPAP;PSV FiO2 (%):  [40 %] 40 % Set Rate:  [20 bmp] 20 bmp Vt Set:  [550 mL] 550 mL PEEP:  [5 cmH20] 5 cmH20 Pressure Support:  [5 cmH20] 5 cmH20 Plateau Pressure:  [11 cmH20-19 cmH20] 11 cmH20PRVC, R 20, Vt 550, PEEP 5, FIO2 100%  INTAKE / OUTPUT: I/O last 3 completed shifts: In: 8894.5 [I.V.:5549.5; Other:10; NG/GT:2715; IV Piggyback:620] Out: 1235 [Urine:1235]  PHYSICAL EXAMINATION: General: Ill-appearing man, mechanically ventilated HEENT: Endotracheal tube in place, oropharynx clear, no scleral icterus Neuro: Opens eyes to voice, will not track, will not follow commands, no purposeful  movement CV: Regular, no murmur, no edema PULM: Clear bilaterally, decreased at both bases GI: Soft, nontender, positive bowel sounds Extremities: No deformity Skin: No rash   LABS:  BMET Recent Labs  Lab 04/13/17 0556 04/14/17 0608 04/15/17 0304  NA 144 148* 146*  K 4.2 4.5 5.7*  CL 112* 115* 115*  CO2 20* 22 20*  BUN 29* 31* 41*  CREATININE 3.04* 2.95* 3.19*  GLUCOSE 135* 152* 281*   Electrolytes Recent Labs  Lab 04/11/17 2153  04/13/17 0556 04/14/17 0608 04/15/17 0304  CALCIUM 8.1*   < > 7.8* 8.2* 8.1*  MG 1.8  --  1.7 1.9 1.8  PHOS 2.0*  --  3.0 3.0  --    < > = values in this interval not displayed.   CBC Recent Labs  Lab 04/11/17 0140 04/14/17 0608 04/15/17 0304  WBC 5.5 8.3 7.7  HGB 11.0* 9.3* 9.3*  HCT 33.5* 29.6* 30.0*  PLT 214 154 194   Coag's Recent Labs  Lab 04/11/17 0140  INR 0.91   Sepsis Markers Recent Labs  Lab 04/11/17 1001 04/11/17 2153  LATICACIDVEN 4.2* 2.9*  PROCALCITON 0.37  --     ABG Recent Labs  Lab 04/11/17 0521 04/11/17 0625  PHART 7.431 7.414  PCO2ART 34.8 39.4  PO2ART 82.0* 403.0*   Liver Enzymes Recent Labs  Lab 04/11/17 0140  AST 52*  ALT 23  ALKPHOS 119  BILITOT 0.5  ALBUMIN 2.8*  Cardiac Enzymes Recent Labs  Lab 04/11/17 0523 04/11/17 1001  TROPONINI 0.06* 0.11*    Glucose Recent Labs  Lab 04/14/17 1130 04/14/17 1532 04/14/17 2041 04/15/17 0033 04/15/17 0413 04/15/17 1106  GLUCAP 214* 316* 321* 265* 242* 214*   Imaging  SIGNIFICANT EVENTS: 2/8: presented to ER following acute encephalopathy, witnessed sz with EMS and 2nd sz in ER, hyperglycemia, and HTN emergency 04/12/2017 started on Dilantin per neurology  04/13/2017 or sedated and now with right hemiparesis for an MRI   LINES/TUBES: 2-PIV ETT 2/8 >> OG tube 2/8 >> Condom catheter; Foley ordered 04/13/2017 Foley placed by urology secondary to urethral stricture  DISCUSSION: 85yoM with hx CVA, DM, HTN, presents to the ER  today with acute encephalopathy and seizures. Initially patient was wandering down hallway of apartment and police were called. EMS arrived to find patient in a bathtub initially unresponsive then combative. He had a seizure during this process and was given versed 5mg . Patient transported to [facility] ER where blood glucose 715 initially and patient with acute encephalopathy. Patient had a head lac that was sutured in the ER. He ha a 2nd seizure in the ER. He continued to be altered combative following this seizure, repeatedly trying to climb out of bed. He received a total of 9mg  Versed and 4mg  Ativan while in the ER. He continued to be agitated and was intubated by ER staff.   ASSESSMENT / PLAN:  PULMONARY acute hypoxic respiratory failure;  Question aspiration pneumonia: Continue current ventilator support.  He is tolerating pressure support ventilation but does not have the mental status for extubation Follow chest x-ray Empiric antibiotics as below  CARDIOVASCULAR 1. 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  Hopefully serum creatinine will plateau Follow potassium Avoid nephrotoxins Do not remove Foley catheter as it was placed by urology due to urethral stricture   GASTROINTESTINAL No active issues  HEMATOLOGIC Anemia: Follow CBC  INFECTIOUS Possible aspiration pneumonia Continue Unasyn as ordered Follow culture data  ENDOCRINE Nonketotic Hyperglycemia vs DKA Improved  NEUROLOGIC Acute encephalpathy; Seizures;  Cocaine abuse;  Acute on chronic CVA Appreciate Neurology Assistance, no definitive stroke seen on initial MRI, repeat is pending EEG shows resolution of seizures Continue Keppra, Vimpat Start aspirin, Lipitor  FAMILY  - Updates: No family at bedside.  We will need to reach out to them given his debilitation, prognosis. -  Inter-disciplinary family meet or Palliative Care meeting due by: 04/18/17  Independent CC time 3 minutes  Baltazar Apo, MD, PhD 04/15/2017, 1:46 PM [facility] [phone] or if no answer [phone]

## 2017-04-15 NOTE — Progress Notes (Signed)
Inpatient Diabetes Program Recommendations  AACE/ADA: New Consensus Statement on Inpatient Glycemic Control (2015)  Target Ranges:  Prepandial:   less than 140 mg/dL      Peak postprandial:   less than 180 mg/dL (1-2 hours)      Critically ill patients:  140 - 180 mg/dL   Lab Results  Component Value Date   GLUCAP 242 (H) 04/15/2017   HGBA1C 11.9 (H) 07/25/2016    Review of Glycemic Control Results for CHEO, SELVEY (MRN [id number]) as of 04/15/2017 10:55  Ref. Range 04/14/2017 07:17 04/14/2017 11:30 04/14/2017 15:32 04/14/2017 20:41 04/15/2017 00:33 04/15/2017 04:13  Glucose-Capillary Latest Ref Range: 65 - 99 mg/dL 137 (H) 214 (H) 316 (H) 321 (H) 265 (H) 242 (H)    Inpatient Diabetes Program Recommendations:   -Novolog 4 units q 4 hrs. Tube feed coverage (hold if tube feedings held or stopped)  Thank you, Bethena Roys E. Rayanne Padmanabhan, RN, MSN, CDE  Diabetes Coordinator Inpatient Glycemic Control Team Team Pager [phone] (8am-5pm) 04/15/2017 10:58 AM

## 2017-04-16 ENCOUNTER — Inpatient Hospital Stay (HOSPITAL_COMMUNITY): Payer: Medicare HMO

## 2017-04-16 LAB — BASIC METABOLIC PANEL
ANION GAP: 11 (ref 5–15)
BUN: 53 mg/dL — AB (ref 6–20)
CALCIUM: 8.2 mg/dL — AB (ref 8.9–10.3)
CO2: 19 mmol/L — ABNORMAL LOW (ref 22–32)
Chloride: 116 mmol/L — ABNORMAL HIGH (ref 101–111)
Creatinine, Ser: 3.26 mg/dL — ABNORMAL HIGH (ref 0.61–1.24)
GFR calc Af Amer: 21 mL/min — ABNORMAL LOW (ref >60)
GFR, EST NON AFRICAN AMERICAN: 18 mL/min — AB (ref >60)
Glucose, Bld: 239 mg/dL — ABNORMAL HIGH (ref 65–99)
Potassium: 6.2 mmol/L — ABNORMAL HIGH (ref 3.5–5.1)
SODIUM: 146 mmol/L — AB (ref 135–145)

## 2017-04-16 LAB — GLUCOSE, CAPILLARY
GLUCOSE-CAPILLARY: 184 mg/dL — AB (ref 65–99)
GLUCOSE-CAPILLARY: 204 mg/dL — AB (ref 65–99)
Glucose-Capillary: 107 mg/dL — ABNORMAL HIGH (ref 65–99)
Glucose-Capillary: 124 mg/dL — ABNORMAL HIGH (ref 65–99)
Glucose-Capillary: 156 mg/dL — ABNORMAL HIGH (ref 65–99)
Glucose-Capillary: 170 mg/dL — ABNORMAL HIGH (ref 65–99)
Glucose-Capillary: 207 mg/dL — ABNORMAL HIGH (ref 65–99)
Glucose-Capillary: 233 mg/dL — ABNORMAL HIGH (ref 65–99)

## 2017-04-16 LAB — CULTURE, BLOOD (ROUTINE X 2)
CULTURE: NO GROWTH
CULTURE: NO GROWTH
SPECIAL REQUESTS: ADEQUATE
Special Requests: ADEQUATE

## 2017-04-16 LAB — RENAL FUNCTION PANEL
ALBUMIN: 1.8 g/dL — AB (ref 3.5–5.0)
ANION GAP: 12 (ref 5–15)
BUN: 58 mg/dL — ABNORMAL HIGH (ref 6–20)
CALCIUM: 7.9 mg/dL — AB (ref 8.9–10.3)
CO2: 20 mmol/L — ABNORMAL LOW (ref 22–32)
Chloride: 116 mmol/L — ABNORMAL HIGH (ref 101–111)
Creatinine, Ser: 3.31 mg/dL — ABNORMAL HIGH (ref 0.61–1.24)
GFR, EST AFRICAN AMERICAN: 21 mL/min — AB (ref >60)
GFR, EST NON AFRICAN AMERICAN: 18 mL/min — AB (ref >60)
Glucose, Bld: 128 mg/dL — ABNORMAL HIGH (ref 65–99)
PHOSPHORUS: 3.1 mg/dL (ref 2.5–4.6)
POTASSIUM: 4.5 mmol/L (ref 3.5–5.1)
SODIUM: 148 mmol/L — AB (ref 135–145)

## 2017-04-16 LAB — CBC
HCT: 29.7 % — ABNORMAL LOW (ref 39.0–52.0)
Hemoglobin: 9.1 g/dL — ABNORMAL LOW (ref 13.0–17.0)
MCH: 26.1 pg (ref 26.0–34.0)
MCHC: 30.6 g/dL (ref 30.0–36.0)
MCV: 85.3 fL (ref 78.0–100.0)
Platelets: 189 10*3/uL (ref 150–400)
RBC: 3.48 MIL/uL — ABNORMAL LOW (ref 4.22–5.81)
RDW: 14.3 % (ref 11.5–15.5)
WBC: 9.1 10*3/uL (ref 4.0–10.5)

## 2017-04-16 MED ORDER — FENTANYL CITRATE (PF) 100 MCG/2ML IJ SOLN
50.0000 ug | INTRAMUSCULAR | Status: DC | PRN
  Administered 2017-04-17: 50 ug via INTRAVENOUS
  Filled 2017-04-16 (×2): qty 2

## 2017-04-16 MED ORDER — SODIUM POLYSTYRENE SULFONATE 15 GM/60ML PO SUSP
30.0000 g | Freq: Once | ORAL | Status: AC
  Administered 2017-04-16: 30 g
  Filled 2017-04-16: qty 120

## 2017-04-16 MED ORDER — LABETALOL HCL 200 MG PO TABS
200.0000 mg | ORAL_TABLET | Freq: Three times a day (TID) | ORAL | Status: DC
  Administered 2017-04-16 – 2017-05-13 (×79): 200 mg
  Filled 2017-04-16 (×82): qty 1

## 2017-04-16 MED ORDER — SODIUM BICARBONATE 8.4 % IV SOLN
25.0000 meq | Freq: Once | INTRAVENOUS | Status: AC
  Administered 2017-04-16: 25 meq via INTRAVENOUS
  Filled 2017-04-16: qty 25
  Filled 2017-04-16: qty 50

## 2017-04-16 MED ORDER — MIDAZOLAM HCL 2 MG/2ML IJ SOLN
2.0000 mg | INTRAMUSCULAR | Status: DC | PRN
  Administered 2017-04-16: 2 mg via INTRAVENOUS
  Administered 2017-04-16 (×2): 4 mg via INTRAVENOUS
  Filled 2017-04-16 (×4): qty 4

## 2017-04-16 MED ORDER — DEXTROSE 5 % IV SOLN
120.0000 mg | Freq: Four times a day (QID) | INTRAVENOUS | Status: DC
  Administered 2017-04-16 – 2017-04-22 (×23): 120 mg via INTRAVENOUS
  Filled 2017-04-16 (×2): qty 12
  Filled 2017-04-16: qty 4
  Filled 2017-04-16 (×2): qty 12
  Filled 2017-04-16 (×2): qty 10
  Filled 2017-04-16: qty 12
  Filled 2017-04-16: qty 10
  Filled 2017-04-16 (×2): qty 12
  Filled 2017-04-16: qty 10
  Filled 2017-04-16: qty 12
  Filled 2017-04-16 (×3): qty 10
  Filled 2017-04-16: qty 12
  Filled 2017-04-16 (×2): qty 10
  Filled 2017-04-16 (×2): qty 12
  Filled 2017-04-16: qty 10
  Filled 2017-04-16: qty 12
  Filled 2017-04-16: qty 2
  Filled 2017-04-16 (×3): qty 10

## 2017-04-16 MED ORDER — INSULIN ASPART 100 UNIT/ML ~~LOC~~ SOLN
4.0000 [IU] | SUBCUTANEOUS | Status: DC
  Administered 2017-04-16: 4 [IU] via SUBCUTANEOUS

## 2017-04-16 MED ORDER — SODIUM CHLORIDE 0.45 % IV SOLN
INTRAVENOUS | Status: DC
  Administered 2017-04-16: 15:00:00 via INTRAVENOUS

## 2017-04-16 MED ORDER — FENTANYL CITRATE (PF) 100 MCG/2ML IJ SOLN
50.0000 ug | INTRAMUSCULAR | Status: DC | PRN
  Administered 2017-04-16 – 2017-04-18 (×2): 50 ug via INTRAVENOUS
  Filled 2017-04-16: qty 2

## 2017-04-16 MED ORDER — HEPARIN SODIUM (PORCINE) 1000 UNIT/ML IJ SOLN
3000.0000 [IU] | Freq: Once | INTRAMUSCULAR | Status: AC
  Administered 2017-04-16: 2400 [IU] via INTRAVENOUS
  Filled 2017-04-16: qty 3

## 2017-04-16 MED ORDER — INSULIN ASPART 100 UNIT/ML ~~LOC~~ SOLN
4.0000 [IU] | SUBCUTANEOUS | Status: DC
  Administered 2017-04-16 – 2017-04-24 (×40): 4 [IU] via SUBCUTANEOUS

## 2017-04-16 NOTE — Progress Notes (Signed)
RT transported patient to MRI and returned to 1W86 without complications. Vitals stable throughout. Patient tolerated well. RT will continue to monitor.

## 2017-04-16 NOTE — Progress Notes (Addendum)
Called bedside RN, April, d/t patient very agitated, unable to calm with verbal commands on MRI table. Patient kicking left leg and left arm. Patient attempting to dislodge ETT with tongue and hyperventilating. Gave extra fentanyl dosage without any sedative response. Having to hold patient manually for his safety at this time. Though eyes are open, patient not regarding verbal cues. Unable to perform MRI at this time.

## 2017-04-16 NOTE — Progress Notes (Signed)
Wasted 175cc of fentanyl in the sink with irekia baynes. Bartholomew Crews, RN 04/16/2017 3:36 PM

## 2017-04-16 NOTE — Procedures (Signed)
Hemodialysis Catheter Insertion Procedure Note Richard Bennett [id number] 07-04-1950  Procedure: Insertion of Hemodialysis Catheter Indications: CRRT  Procedure Details Consent: Risks of procedure as well as the alternatives and risks of each were explained to the (patient/caregiver).  Consent for procedure obtained. Time Out: Verified patient identification, verified procedure, site/side was marked, verified correct patient position, special equipment/implants available, medications/allergies/relevent history reviewed, required imaging and test results available.  Performed  Maximum sterile technique was used including antiseptics, cap, gloves, gown, hand hygiene, mask and sheet. Skin prep: Chlorhexidine; local anesthetic administered A antimicrobial bonded/coated triple lumen catheter was placed in the right internal jugular vein using the Seldinger technique.  Evaluation Blood flow good Complications: No apparent complications Patient did tolerate procedure well. Chest X-ray ordered to verify placement.  CXR: pending.  Procedure performed under direct ultrasound guidance for real time vessel cannulation.      Montey Hora, [facility] Pgr: [phone]  or [phone] 04/16/2017, 3:36 PM

## 2017-04-16 NOTE — Consult Note (Signed)
[facility] Consult Note     Date: 04/16/2017                  Patient Name:  Richard Bennett  MRN: [id number]  DOB: 1950/09/20  Age / Sex: 67 y.o., male         PCP: [name], MD                 Service Requesting Consult: PCCM                 Reason for Consult: Hyperkalemia, worsening renal failure            Chief Complaint: encephalopathy HPI:  28 yoM PMH CVA, DM, HTN, presented with acute encephalopathy and seizures. Initial presentation was after he was found roaming hallways of his apartment building, and then EMS found him in the bath and he was combative. He had a seizure in the field and another seizure in the ED , subsequently requiring intubation.   Renal service was consulted for hyperkalemia, HTN urgency and AKI/CKD stage 3 (Scr has ranged from 1.3-2.3 over the last 2 years).  Patient has had gradually worsening hyperkalemia from 4.5 escalating to 6.2 today.  He additionally has had worsening renal function with creatinine elevated on admission to 2.95 from baseline of 1.9 in January 2018, now elevated at 3.29.  Urine output has been trending down and he has 475 mL out yesterday.    He remains intubated and mechanically ventilated in the ICU.  He is receiving Lantus and NovoLog for type 2 diabetes with elevated sugars at 239.  Resistant hypertension on Catapres, labetalol, Norvasc, metoprolol, and nicardipine with permissive hypertension to 140s for bilateral ischemic strokes noted on MRI today.  Past Medical History:  Diagnosis Date  . Chest pain   . Colon polyps    adenomatous  . Hyperlipidemia   . Hypertension   . Refusal of blood transfusions as patient is Jehovah's Witness   . Stroke ([place])   . Type II diabetes mellitus ([place])     Past Surgical History:  Procedure Laterality Date  . APPENDECTOMY    . BACK SURGERY    . LUMBAR DISC SURGERY     "herniated"    Family History  Problem Relation Age of Onset  . Diabetes Mother   . Stroke  Mother   . Cancer Father   . Diabetes Brother   . Seizures Brother   . Colon cancer Neg Hx   . Esophageal cancer Neg Hx   . Stomach cancer Neg Hx   . Rectal cancer Neg Hx    Social History:  reports that he quit smoking about 39 years ago. His smoking use included cigarettes. He has a 10.00 pack-year smoking history. he has never used smokeless tobacco. He reports that he drinks alcohol. He reports that he uses drugs. Drug: Heroin.  Allergies: No Known Allergies  Medications Prior to Admission  Medication Sig Dispense Refill  . amLODipine (NORVASC) 10 MG tablet Take 10 mg by mouth daily.    Marland Kitchen aspirin 325 MG tablet Take 1 tablet (325 mg total) by mouth daily. (Patient not taking: Reported on 04/11/2017)    . atorvastatin (LIPITOR) 80 MG tablet Take 1 tablet (80 mg total) by mouth daily at 6 PM. (Patient not taking: Reported on 04/11/2017)    . cloNIDine (CATAPRES) 0.1 MG tablet Take 0.1 mg by mouth 2 (two) times daily.    . Eluxadoline (VIBERZI) 100 MG TABS Take  100 mg by mouth 2 (two) times daily.    . ferrous sulfate 325 (65 FE) MG tablet Take 1 tablet (325 mg total) by mouth daily with breakfast. (Patient not taking: Reported on 04/11/2017)  3  . hydrochlorothiazide (MICROZIDE) 12.5 MG capsule Take 12.5 mg by mouth daily.    Marland Kitchen LANTUS SOLOSTAR 100 UNIT/ML Solostar Pen Inject 15 Units into the skin every morning. (Patient not taking: Reported on 04/11/2017) 15 mL 3  . losartan (COZAAR) 50 MG tablet Take 1 tablet (50 mg total) by mouth daily. (Patient not taking: Reported on 04/11/2017) 30 tablet 0  . metoprolol tartrate (LOPRESSOR) 25 MG tablet Take 1 tablet (25 mg total) by mouth 2 (two) times daily. (Patient not taking: Reported on 04/11/2017) 60 tablet 0  . sodium bicarbonate 650 MG tablet Take 1 tablet (650 mg total) by mouth 2 (two) times daily. (Patient not taking: Reported on 04/11/2017)      Results for orders placed or performed during the hospital encounter of 04/11/17 (from the past 48  hour(s))  Glucose, capillary     Status: Abnormal   Collection Time: 04/14/17  8:41 PM  Result Value Ref Range   Glucose-Capillary 321 (H) 65 - 99 mg/dL   Comment 1 Capillary Specimen    Comment 2 Notify RN   Glucose, capillary     Status: Abnormal   Collection Time: 04/15/17 12:33 AM  Result Value Ref Range   Glucose-Capillary 265 (H) 65 - 99 mg/dL  Basic metabolic panel     Status: Abnormal   Collection Time: 04/15/17  3:04 AM  Result Value Ref Range   Sodium 146 (H) 135 - 145 mmol/L   Potassium 5.7 (H) 3.5 - 5.1 mmol/L    Comment: DELTA CHECK NOTED   Chloride 115 (H) 101 - 111 mmol/L   CO2 20 (L) 22 - 32 mmol/L   Glucose, Bld 281 (H) 65 - 99 mg/dL   BUN 41 (H) 6 - 20 mg/dL   Creatinine, Ser 3.19 (H) 0.61 - 1.24 mg/dL   Calcium 8.1 (L) 8.9 - 10.3 mg/dL   GFR calc non Af Amer 19 (L) >60 mL/min   GFR calc Af Amer 22 (L) >60 mL/min    Comment: (NOTE) The eGFR has been calculated using the CKD EPI equation. This calculation has not been validated in all clinical situations. eGFR's persistently <60 mL/min signify possible Chronic Kidney Disease.    Anion gap 11 5 - 15    Comment: Performed at [facility] [address] [place], [place] [address]  Magnesium     Status: None   Collection Time: 04/15/17  3:04 AM  Result Value Ref Range   Magnesium 1.8 1.7 - 2.4 mg/dL    Comment: Performed at [facility] [address] [place], [place] [address]  CBC     Status: Abnormal   Collection Time: 04/15/17  3:04 AM  Result Value Ref Range   WBC 7.7 4.0 - 10.5 K/uL   RBC 3.50 (L) 4.22 - 5.81 MIL/uL   Hemoglobin 9.3 (L) 13.0 - 17.0 g/dL   HCT 30.0 (L) 39.0 - 52.0 %   MCV 85.7 78.0 - 100.0 fL   MCH 26.6 26.0 - 34.0 pg   MCHC 31.0 30.0 - 36.0 g/dL   RDW 14.2 11.5 - 15.5 %   Platelets 194 150 - 400 K/uL    Comment: Performed at [facility], [place] [address], [place], [place] [address]  Glucose, capillary     Status: Abnormal   Collection Time: 04/15/17  4:13 AM   Result Value Ref Range   Glucose-Capillary 242 (H) 65 - 99 mg/dL  Glucose, capillary     Status: Abnormal   Collection Time: 04/15/17 11:06 AM  Result Value Ref Range   Glucose-Capillary 214 (H) 65 - 99 mg/dL   Comment 1 Capillary Specimen    Comment 2 Notify RN   Glucose, capillary     Status: Abnormal   Collection Time: 04/15/17  4:26 PM  Result Value Ref Range   Glucose-Capillary 204 (H) 65 - 99 mg/dL   Comment 1 Capillary Specimen   Glucose, capillary     Status: Abnormal   Collection Time: 04/16/17 12:18 AM  Result Value Ref Range   Glucose-Capillary 233 (H) 65 - 99 mg/dL   Comment 1 Capillary Specimen    Comment 2 Notify RN   Basic metabolic panel     Status: Abnormal   Collection Time: 04/16/17  3:57 AM  Result Value Ref Range   Sodium 146 (H) 135 - 145 mmol/L   Potassium 6.2 (H) 3.5 - 5.1 mmol/L   Chloride 116 (H) 101 - 111 mmol/L   CO2 19 (L) 22 - 32 mmol/L   Glucose, Bld 239 (H) 65 - 99 mg/dL   BUN 53 (H) 6 - 20 mg/dL   Creatinine, Ser 3.26 (H) 0.61 - 1.24 mg/dL   Calcium 8.2 (L) 8.9 - 10.3 mg/dL   GFR calc non Af Amer 18 (L) >60 mL/min   GFR calc Af Amer 21 (L) >60 mL/min    Comment: (NOTE) The eGFR has been calculated using the CKD EPI equation. This calculation has not been validated in all clinical situations. eGFR's persistently <60 mL/min signify possible Chronic Kidney Disease.    Anion gap 11 5 - 15    Comment: Performed at [facility] [address] [place], [place] [address]  CBC     Status: Abnormal   Collection Time: 04/16/17  3:57 AM  Result Value Ref Range   WBC 9.1 4.0 - 10.5 K/uL   RBC 3.48 (L) 4.22 - 5.81 MIL/uL   Hemoglobin 9.1 (L) 13.0 - 17.0 g/dL   HCT 29.7 (L) 39.0 - 52.0 %   MCV 85.3 78.0 - 100.0 fL   MCH 26.1 26.0 - 34.0 pg   MCHC 30.6 30.0 - 36.0 g/dL   RDW 14.3 11.5 - 15.5 %   Platelets 189 150 - 400 K/uL    Comment: Performed at [facility], [place] [address] [place], [place] [address]  Glucose, capillary      Status: Abnormal   Collection Time: 04/16/17  4:10 AM  Result Value Ref Range   Glucose-Capillary 204 (H) 65 - 99 mg/dL   Comment 1 Capillary Specimen    Comment 2 Notify RN   Glucose, capillary     Status: Abnormal   Collection Time: 04/16/17  7:10 AM  Result Value Ref Range   Glucose-Capillary 207 (H) 65 - 99 mg/dL   Comment 1 Capillary Specimen    Comment 2 Notify RN   Glucose, capillary     Status: Abnormal   Collection Time: 04/16/17 12:34 PM  Result Value Ref Range   Glucose-Capillary 156 (H) 65 - 99 mg/dL   Comment 1 Capillary Specimen   Glucose, capillary     Status: Abnormal   Collection Time: 04/16/17  3:08 PM  Result Value Ref Range  Glucose-Capillary 124 (H) 65 - 99 mg/dL   Comment 1 Capillary Specimen    Mr Brain Wo Contrast  Result Date: 04/16/2017 CLINICAL DATA:  New right-sided weakness.  Polysubstance abuse. EXAM: MRI HEAD WITHOUT CONTRAST TECHNIQUE: Multiplanar, multiecho pulse sequences of the brain and surrounding structures were obtained without intravenous contrast. COMPARISON:  CT head 04/13/2017, MRI 04/11/2017 FINDINGS: Brain: Compared with the recent MRI, there are new areas of infarction bilaterally, left greater than right. These appear to be within the watershed territory bilaterally in the deep white matter structures. This corresponds a new area of low-density in the deep white matter on the left on recent CT. No associated hemorrhage or mass. Generalized atrophy. 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 These show interval improvement from 04/11/2017. 3 mm midline shift to the right. Negative for hydrocephalus. Negative for mass lesion. Vascular: Normal arterial flow voids Skull and upper cervical spine: Negative Sinuses/Orbits: The patient is intubated. Mucosal edema throughout the paranasal sinuses and mastoid sinus bilaterally. Normal orbit Other: None IMPRESSION: New areas of acute infarct bilaterally, left greater than right.  These appear to be watershed infarcts bilaterally. Negative for hemorrhage. Electronically Signed   By: Franchot Gallo M.D.   On: 04/16/2017 13:15   Dg Chest Port 1 View  Result Date: 04/15/2017 CLINICAL DATA:  67 year old male with history of acute respiratory failure. Shortness of breath. EXAM: PORTABLE CHEST 1 VIEW COMPARISON:  Chest x-ray 04/14/2017. FINDINGS: An endotracheal tube is in place with tip 5.1 cm above the carina. Lung volumes are normal. No consolidative airspace disease. No pleural effusions. No pneumothorax. No pulmonary nodule or mass noted. Pulmonary vasculature and the cardiomediastinal silhouette are within normal limits. Atherosclerosis in the thoracic aorta. Small metallic density again projects over the mid right hemithorax. IMPRESSION: 1. Tip of endotracheal tube is 5.1 cm above the carina. 2. No radiographic evidence of acute cardiopulmonary disease. 3. Aortic atherosclerosis. Electronically Signed   By: Vinnie Langton M.D.   On: 04/15/2017 07:47   Dg Abd Portable 1v  Result Date: 04/15/2017 CLINICAL DATA:  Status post OG tube placement. EXAM: PORTABLE ABDOMEN - 1 VIEW COMPARISON:  None. FINDINGS: OG tube is looped in the fundus of the stomach. Both the tip and side-port are in the stomach. Bowel gas pattern is non obstructive. Prominent stool ascending colon noted. IMPRESSION: OG tube in good position.  No acute finding. Electronically Signed   By: Inge Rise M.D.   On: 04/15/2017 13:48   Vas US Carotid  Result Date: 04/15/2017 Carotid Arterial Duplex Study Indications:   CVA and Follow up left common carotid artery stenosis. Risk Factors:  Hypertension, hyperlipidemia. Other Factors: Type II diabetes mellitus. Examination Guidelines: A complete evaluation includes B-mode imaging, spectral doppler, color doppler, and power doppler as needed of all accessible portions of each vessel. Bilateral testing is considered an integral part of a complete examination. Limited  examinations for reoccurring indications may be performed as noted.  Right Carotid Findings: +----------+--------+--------+--------+------------------+--------+           PSV cm/sEDV cm/sStenosisDescribe          Comments +----------+--------+--------+--------+------------------+--------+ CCA Prox  -217    -26             diffuse and smooth         +----------+--------+--------+--------+------------------+--------+ CCA Mid                           diffuse and smooth         +----------+--------+--------+--------+------------------+--------+  CCA Distal-218    -27                                        +----------+--------+--------+--------+------------------+--------+ ICA Prox  -177    -29             heterogenous               +----------+--------+--------+--------+------------------+--------+ ICA Distal-128    -36                                        +----------+--------+--------+--------+------------------+--------+ ECA       -255    -22             diffuse and smooth         +----------+--------+--------+--------+------------------+--------+ +----------+--------+-------+----------------+-------------------+           PSV cm/sEDV cmsDescribe        Arm Pressure (mmHG) +----------+--------+-------+----------------+-------------------+ Subclavian260            Multiphasic, WNL                    +----------+--------+-------+----------------+-------------------+ +---------+--------+--+--------+--+---------+ VertebralPSV cm/s89EDV cm/s17Antegrade +---------+--------+--+--------+--+---------+  Left Carotid Findings: +----------+--------+--------+--------+--------------------------+--------+           PSV cm/sEDV cm/sStenosisDescribe                  Comments +----------+--------+--------+--------+--------------------------+--------+ CCA Prox  374     64      >50%    diffuse and smooth                  +----------+--------+--------+--------+--------------------------+--------+ CCA Distal479     66      >50%    diffuse and smooth                 +----------+--------+--------+--------+--------------------------+--------+ ICA Prox  -213    -30             heterogenous and irregular         +----------+--------+--------+--------+--------------------------+--------+ ICA Distal-99     -18                                                +----------+--------+--------+--------+--------------------------+--------+ ECA       -393    -31             heterogenous                       +----------+--------+--------+--------+--------------------------+--------+ +----------+--------+--------+--------+-------------------+ SubclavianPSV cm/sEDV cm/sDescribeArm Pressure (mmHG) +----------+--------+--------+--------+-------------------+           451             Stenotic                    +----------+--------+--------+--------+-------------------+ +---------+--------+--+--------+--+-----------------+ VertebralPSV cm/s29EDV cm/s12Atypical waveform +---------+--------+--+--------+--+-----------------+  Final Interpretation: Right Carotid: Elevated common carotid artery velocities, along with plaque                morphology suggest hemodynamically significant stenosis versus                proximal obstruction. Elevated right internal carotid artery  velocities suggest low range 40-59% stenosis, however this is                likely due to proximal obstruction given lack of significant                plaque morphology. Left Carotid: Elevated common carotid artery velocities, along with significant               plaque morphology is suggestive of >50% stenosis. This is               consistent with previous findings by CTA and ultrasound. Elevated               left internal carotid artery velocities suggest low range 60-79%               stenosis, however this may be due to  proximal obstruction given               lack of significant plaque morphology. Vertebrals:  Right vertebral artery was patent with antegrade flow. Left              vertebral artery exhibits atypical waveform, suggestive of possible              proximal obstruction. Subclavians: Left subclavian artery was moderately stenotic. Normal flow              hemodynamics were seen in the right subclavian artery. Correlate              with CT angio if clinically necessary *See table(s) above for measurements and observations.  Electronically signed by Antony Contras on 04/15/2017 at 8:11:18 AM.     ROS  Blood pressure 120/64, pulse 65, temperature 98.9 F (37.2 C), temperature source Oral, resp. rate 19, height 5' 8"  (1.727 m), weight 191 lb 5.8 oz (86.8 kg), SpO2 98 %. Physical Exam  GEN: Intubated, mechanically ventilated, sedated chronically ill-appearing male who rests in bed, NAD RESPIRATORY: +mechanical breath sounds/coarse breath sounds anteriorly  CV: RRR, no m/r/g, +mild hand edema, no LE edema GI: Soft, non-tender, non-distended, normoactive bowel sounds, no hepatosplenomegaly SKIN: warm and dry, no rashes or lesions NEURO: post-ictal/sedated   Assessment/Plan 1.  Hyperkalemia -worsening 6.2 today.  No EKG changes noted.  Likely secondary to worsening renal function and inability to clear potassium.  Additionally likely worsened by hyperglycemia.  Patient has received Kayexalate 30 mg. -Lasix 160 mg 3 times daily ordered for potassium clearance -Insulin already ordered in the setting of elevated blood sugars -Kayexalate 60 mg ordered for this p.m. -PCCM to place HD central line given that patient will likely require HD if renal function does not turn around  2.  AKI/CKD stage 3 - BL creatinine 1.5-1.9 1 year ago.  Gradually worsening kidney function with creatinine elevated at 3.29 today, BUN 53.  Likely in the setting of resistant hypertension requiring 5 antihypertensive medications.   Patient has fluids at 10 mls per hour.  Will also order renal US to evaluate kidney size and number as well as r/o obstruction as a possible contributing factor.   3. Resistant hypertension -patient currently permissive hypertension to 863 systolic per neurology recommendations.  He has no new acute brain infarct.  Currently with 4 agents on board labetalol, Norvasc, metoprolol, nicardipine, Catapres.   permissive hypertension may be somewhat beneficial to kidney function at this point given that rapid reduction in blood pressure may cause further renal injury.  4. Acute CVA/seizures  -  neurology on board. R>L new infarcts. Als obil subdural hygromas, no mass effect. - keppra, vimpat hanging  5.  Type 2 diabetes -managed per primary team, insulin-dependent  6. Anemia - presumably from acute illness but may have some underlying anemia of CKD stage 3.  His hgb has fluctuated several times  Everrett Coombe, MD PGY2  I have seen and examined this patient and agree with plan and assessment in the above note with renal recommendations/intervention highlighted.  Pt with AKI/CKD in setting of hypertensive emergency and stroke.  Possibly some volume depletion due to AMS as well as hemodynamic effects of BP lowering in chronically elevated BP's.  Hyeprkalemia is concerning and will treat conservatively for now but may require urgent HD.  HD cath to be placed by PCCM and will recheck potassium levels following medical therapies.  If no improvement will plan for urgent HD tonight. Broadus John A Keidy Thurgood,MD 04/16/2017 5:14 PM

## 2017-04-16 NOTE — Progress Notes (Signed)
Pt comfortable and cooperating with the ventilator since sedation turned off this afternoon. At 0000 pt became agitated w/ aggressive non purposeful LUE movement. Pt able to make eye contact. Pt unable to follow commands. Pt also desynchronous with the ventilator. Fentanyl gtt started back. Nursing will continue to monitor.

## 2017-04-16 NOTE — Care Management Note (Signed)
Case Management Note  Patient Details  Name: Richard Bennett MRN: [id number] Date of Birth: 10-08-1950  Subjective/Objective:   Pt admitted with acute enceph and seizures                 Action/Plan:  Pt is from home.  Pt is on the ventilator and now requiring CRRT    Expected Discharge Date:                  Expected Discharge Plan:  Home/Self Care  In-House Referral:     Discharge planning Services  CM Consult  Post Acute Care Choice:    Choice offered to:     DME Arranged:    DME Agency:     HH Arranged:    HH Agency:     Status of Service:     If discussed at [employer], dates discussed:    Additional Comments:  Maryclare Labrador, RN 04/16/2017, 3:54 PM

## 2017-04-16 NOTE — Progress Notes (Signed)
PULMONARY / CRITICAL CARE MEDICINE   Name: Richard Bennett MRN: [id number] DOB: February 28, 1951    ADMISSION DATE:  04/11/2017 CONSULTATION DATE: 04/11/17  REFERRING MD: ER MD  CHIEF COMPLAINT: Seizures, HTN emergency, Nonketotic hyperglycemia  HISTORY OF PRESENT ILLNESS:   11yoM with hx CVA, DM, HTN, presented to the ER w/ acute encephalopathy and seizures. Initially patient was wandering down hallway of apartment and police were called. EMS arrived to find patient in a bathtub initially unresponsive then combative. He had a seizure during this process and was given versed 5mg . Patient transported to [facility] ER where blood glucose 715 initially and patient with acute encephalopathy. Patient had a head lac that was sutured in the ER. He ha a 2nd seizure in the ER. He continued to be altered combative following this seizure, repeatedly trying to climb out of bed. He received a total of 9mg  Versed and 4mg  Ativan while in the ER. He continued to be agitated and was intubated by ER staff.   SUBJECTIVE:  Just back from MRI  VITAL SIGNS: BP (!) 110/56   Pulse 71   Temp 98.9 F (37.2 C) (Oral)   Resp 20   Ht 5\' 8"  (1.727 m)   Wt 191 lb 5.8 oz (86.8 kg)   SpO2 100%   BMI 29.10 kg/m   VENTILATOR SETTINGS: Vent Mode: PRVC FiO2 (%):  [40 %] 40 % Set Rate:  [20 bmp] 20 bmp Vt Set:  [550 mL] 550 mL PEEP:  [5 cmH20] 5 cmH20 Pressure Support:  [5 cmH20] 5 cmH20 Plateau Pressure:  [13 cmH20-20 cmH20] 20 cmH20  INTAKE / OUTPUT:  Intake/Output Summary (Last 24 hours) at 04/16/2017 1318 Last data filed at 04/16/2017 1200 Gross per 24 hour  Intake 5565.85 ml  Output 570 ml  Net 4995.85 ml     PHYSICAL EXAMINATION: General: 67 year old African-American male currently sedated on ventilator HEENT: Normocephalic atraumatic no jugular venous distention orally intubated Pulmonary: Clear to auscultation no accessory use Cardiac: Regular rate and rhythm remains hypertensive on Cardene infusion Abdomen  actually Extremities/musculoskeletal: Still has right-sided hemiparesis, the right side is more swollen than dependent edema Neuro: Sedated, hemiparesis on the right.  Not following commands currently.  LABS:  BMET Recent Labs  Lab 04/14/17 0608 04/15/17 0304 04/16/17 0357  NA 148* 146* 146*  K 4.5 5.7* 6.2*  CL 115* 115* 116*  CO2 22 20* 19*  BUN 31* 41* 53*  CREATININE 2.95* 3.19* 3.26*  GLUCOSE 152* 281* 239*   Electrolytes Recent Labs  Lab 04/11/17 2153  04/13/17 0556 04/14/17 0608 04/15/17 0304 04/16/17 0357  CALCIUM 8.1*   < > 7.8* 8.2* 8.1* 8.2*  MG 1.8  --  1.7 1.9 1.8  --   PHOS 2.0*  --  3.0 3.0  --   --    < > = values in this interval not displayed.   CBC Recent Labs  Lab 04/14/17 0608 04/15/17 0304 04/16/17 0357  WBC 8.3 7.7 9.1  HGB 9.3* 9.3* 9.1*  HCT 29.6* 30.0* 29.7*  PLT 154 194 189   Coag's Recent Labs  Lab 04/11/17 0140  INR 0.91   Sepsis Markers Recent Labs  Lab 04/11/17 1001 04/11/17 2153  LATICACIDVEN 4.2* 2.9*  PROCALCITON 0.37  --     ABG Recent Labs  Lab 04/11/17 0521 04/11/17 0625  PHART 7.431 7.414  PCO2ART 34.8 39.4  PO2ART 82.0* 403.0*   Liver Enzymes Recent Labs  Lab 04/11/17 0140  AST 52*  ALT 23  ALKPHOS 119  BILITOT 0.5  ALBUMIN 2.8*   Cardiac Enzymes Recent Labs  Lab 04/11/17 0523 04/11/17 1001  TROPONINI 0.06* 0.11*    Glucose Recent Labs  Lab 04/15/17 1106 04/15/17 1626 04/16/17 0018 04/16/17 0410 04/16/17 0710 04/16/17 1234  GLUCAP 214* 204* 233* 204* 207* 156*   Imaging  SIGNIFICANT EVENTS: 2/8: presented to ER following acute encephalopathy, witnessed sz with EMS and 2nd sz in ER, hyperglycemia, and HTN emergency 04/12/2017 started on Dilantin per neurology  04/13/2017 or sedated and now with right hemiparesis for an MRI   LINES/TUBES: 2-PIV ETT 2/8 >> OG tube 2/8 >> Condom catheter; Foley ordered 04/13/2017 Foley placed by urology secondary to urethral  stricture  DISCUSSION: New strokes. Also w/ new AKI. Will need HD. Difficult to prognosticate his neurological recovery in the face of aki.    ASSESSMENT / PLAN:  Acute encephalpathy; Seizures;  Cocaine abuse;  Acute on chronic CVA felt to be watershed in nature  H/o carotid artery stenosis (c/w prior studies) F/u MRI:  New areas of acute infarct bilaterally, left greater than right. These appear to be watershed infarcts bilaterally. Negative for Hemorrhage. Eeg: no seizure  Hemodynamically significant  Plan Cont keppra and vimpat  Cont asa and Lipitor  Cont supportive care  Intubated for airway protection.  Ventilator dependent  Possible aspiration -PCXR personally reviewed ett ~ 5cm above carina. No infiltrate  -not ready for extubation given MS Plan  Full vent support  F/u PRN CXR PAD protocol (RASS goal 0) Day # 6/7 unasyn  HTN emergency; hx HTN Plan Cont cardene gtt (goal SBP <140) Cont clonidine  Change lopressor to scheduled labetolol Hold further changes as expect that BP issues may improve w/ HD if started  AKI-on-CKD w/ hyperkalemia and NAG metabolic acidosis Urethral stricture -scr rising -got Kayexalate this am  Plan Keep foley in place. Will f/u w/ urology Avoid nephrotoxins Repeat chem Ask renal to see; may need HD  Fluid and electrolyte imbalance:  Hyperkalemia, hypernatremia and hyperchloremia  Plan Repeat chemistry  Will allow for some mild hypernatremia at least for now given acute stroke  Anemia: HE IS JEHOVAH WiTNESS  Plan Trend cbc No transfusions   Nonketotic Hyperglycemia vs DKA ->still w/ hyperglycemia Plan Ssi, cont lantus Add basal ssi    FAMILY  - Updates: No family at bedside.  We will need to reach out to them given his debilitation, prognosis. - Inter-disciplinary family meet or Palliative Care meeting due by: 04/18/17  ctt 45 minutes  Erick Colace ACNP-BC [place] Pager # [phone] OR #  [phone] if no answer

## 2017-04-16 NOTE — Progress Notes (Signed)
Noted Novolog 4 units tube feed coverage added. -Consider decrease in Novolog correction to sensitive.  Thank you, Nani Gasser. Gertha Lichtenberg, RN, MSN, CDE  Diabetes Coordinator Inpatient Glycemic Control Team Team Pager [phone] (8am-5pm) 04/16/2017 1:54 PM

## 2017-04-16 NOTE — Progress Notes (Signed)
150 cc fentanyl gtt wasted in sink with Lysle Rubens RN

## 2017-04-16 NOTE — Progress Notes (Signed)
Received versed from April, RN. Gave versed 4 mg IV per MD order. Patient asleep. Will proceed with MRI as ordered. Will monitor closely.

## 2017-04-16 NOTE — Progress Notes (Signed)
   04/16/17 1000  Clinical Encounter Type  Visited With Patient  Visit Type Initial  Referral From Nurse  Consult/Referral To Chaplain  Spiritual Encounters  Spiritual Needs Emotional  Stress Factors  Patient Stress Factors Exhausted  Family Stress Factors None identified   Pt was laying in his bed intubated. No family present but charge nurse was and mentioned that none of the patient's family members had been seen. I provided emotional support and compassionate presence.  Richard Bennett a Medical sales representative, [employer]

## 2017-04-17 ENCOUNTER — Other Ambulatory Visit (HOSPITAL_COMMUNITY): Payer: Medicare HMO

## 2017-04-17 ENCOUNTER — Inpatient Hospital Stay (HOSPITAL_COMMUNITY): Payer: Medicare HMO

## 2017-04-17 DIAGNOSIS — I340 Nonrheumatic mitral (valve) insufficiency: Secondary | ICD-10-CM

## 2017-04-17 DIAGNOSIS — I6330 Cerebral infarction due to thrombosis of unspecified cerebral artery: Secondary | ICD-10-CM

## 2017-04-17 LAB — ECHOCARDIOGRAM COMPLETE
Height: 68 in
WEIGHTICAEL: 3139.35 [oz_av]

## 2017-04-17 LAB — LIPID PANEL
Cholesterol: 172 mg/dL (ref 0–200)
HDL: 41 mg/dL (ref >40)
LDL CALC: 111 mg/dL — AB (ref 0–99)
TRIGLYCERIDES: 98 mg/dL (ref <150)
Total CHOL/HDL Ratio: 4.2 RATIO
VLDL: 20 mg/dL (ref 0–40)

## 2017-04-17 LAB — GLUCOSE, CAPILLARY
GLUCOSE-CAPILLARY: 76 mg/dL (ref 65–99)
GLUCOSE-CAPILLARY: 128 mg/dL — AB (ref 65–99)
Glucose-Capillary: 102 mg/dL — ABNORMAL HIGH (ref 65–99)
Glucose-Capillary: 119 mg/dL — ABNORMAL HIGH (ref 65–99)
Glucose-Capillary: 139 mg/dL — ABNORMAL HIGH (ref 65–99)
Glucose-Capillary: 87 mg/dL (ref 65–99)
Glucose-Capillary: 97 mg/dL (ref 65–99)

## 2017-04-17 LAB — RENAL FUNCTION PANEL
ALBUMIN: 1.8 g/dL — AB (ref 3.5–5.0)
Albumin: 1.9 g/dL — ABNORMAL LOW (ref 3.5–5.0)
Anion gap: 12 (ref 5–15)
Anion gap: 12 (ref 5–15)
BUN: 60 mg/dL — AB (ref 6–20)
BUN: 64 mg/dL — AB (ref 6–20)
CALCIUM: 8.2 mg/dL — AB (ref 8.9–10.3)
CO2: 22 mmol/L (ref 22–32)
CO2: 23 mmol/L (ref 22–32)
CREATININE: 3.28 mg/dL — AB (ref 0.61–1.24)
CREATININE: 3.34 mg/dL — AB (ref 0.61–1.24)
Calcium: 8.1 mg/dL — ABNORMAL LOW (ref 8.9–10.3)
Chloride: 114 mmol/L — ABNORMAL HIGH (ref 101–111)
Chloride: 112 mmol/L — ABNORMAL HIGH (ref 101–111)
GFR calc Af Amer: 21 mL/min — ABNORMAL LOW (ref >60)
GFR calc Af Amer: 21 mL/min — ABNORMAL LOW (ref >60)
GFR calc non Af Amer: 18 mL/min — ABNORMAL LOW (ref >60)
GFR, EST NON AFRICAN AMERICAN: 18 mL/min — AB (ref >60)
GLUCOSE: 86 mg/dL (ref 65–99)
Glucose, Bld: 93 mg/dL (ref 65–99)
PHOSPHORUS: 3.8 mg/dL (ref 2.5–4.6)
Phosphorus: 4.4 mg/dL (ref 2.5–4.6)
Potassium: 4.4 mmol/L (ref 3.5–5.1)
Potassium: 4 mmol/L (ref 3.5–5.1)
SODIUM: 147 mmol/L — AB (ref 135–145)
Sodium: 148 mmol/L — ABNORMAL HIGH (ref 135–145)

## 2017-04-17 LAB — ALT: ALT: 21 U/L (ref 17–63)

## 2017-04-17 LAB — CBC
HCT: 25.7 % — ABNORMAL LOW (ref 39.0–52.0)
Hemoglobin: 8 g/dL — ABNORMAL LOW (ref 13.0–17.0)
MCH: 26.3 pg (ref 26.0–34.0)
MCHC: 31.1 g/dL (ref 30.0–36.0)
MCV: 84.5 fL (ref 78.0–100.0)
PLATELETS: 195 10*3/uL (ref 150–400)
RBC: 3.04 MIL/uL — ABNORMAL LOW (ref 4.22–5.81)
RDW: 14 % (ref 11.5–15.5)
WBC: 8.5 10*3/uL (ref 4.0–10.5)

## 2017-04-17 LAB — HEMOGLOBIN A1C
HEMOGLOBIN A1C: 11.8 % — AB (ref 4.8–5.6)
Mean Plasma Glucose: 291.96 mg/dL

## 2017-04-17 MED ORDER — FREE WATER
200.0000 mL | Freq: Three times a day (TID) | Status: DC
Start: 2017-04-17 — End: 2017-04-23
  Administered 2017-04-17 – 2017-04-23 (×17): 200 mL

## 2017-04-17 MED ORDER — ORAL CARE MOUTH RINSE
15.0000 mL | OROMUCOSAL | Status: DC
  Administered 2017-04-17 – 2017-04-19 (×8): 15 mL via OROMUCOSAL

## 2017-04-17 MED ORDER — LIDOCAINE-PRILOCAINE 2.5-2.5 % EX CREA: 1.0000 "application " | TOPICAL_CREAM | CUTANEOUS | Status: DC | PRN

## 2017-04-17 MED ORDER — CHLORHEXIDINE GLUCONATE CLOTH 2 % EX PADS
6.0000 | MEDICATED_PAD | Freq: Every day | CUTANEOUS | Status: DC
  Administered 2017-04-17 – 2017-04-26 (×10): 6 via TOPICAL

## 2017-04-17 MED ORDER — SODIUM CHLORIDE 0.9 % IV SOLN: 100.0000 mL | INTRAVENOUS | Status: DC | PRN

## 2017-04-17 MED ORDER — HEPARIN SODIUM (PORCINE) 1000 UNIT/ML DIALYSIS
1000.0000 [IU] | INTRAMUSCULAR | Status: DC | PRN
  Administered 2017-04-17: 1000 [IU] via INTRAVENOUS_CENTRAL

## 2017-04-17 MED ORDER — VITAMIN B-1 100 MG PO TABS
100.0000 mg | ORAL_TABLET | Freq: Every day | ORAL | Status: DC
  Administered 2017-04-18 – 2017-04-27 (×10): 100 mg
  Filled 2017-04-17 (×10): qty 1

## 2017-04-17 MED ORDER — SODIUM CHLORIDE 0.9% FLUSH
10.0000 mL | Freq: Two times a day (BID) | INTRAVENOUS | Status: DC
  Administered 2017-04-17: 10 mL

## 2017-04-17 MED ORDER — LIDOCAINE HCL (PF) 1 % IJ SOLN: 5.0000 mL | INTRAMUSCULAR | Status: DC | PRN

## 2017-04-17 MED ORDER — SODIUM CHLORIDE 0.9% FLUSH: 10.0000 mL | INTRAVENOUS | Status: DC | PRN

## 2017-04-17 MED ORDER — ALTEPLASE 2 MG IJ SOLR: 2.0000 mg | Freq: Once | INTRAMUSCULAR | Status: DC | PRN

## 2017-04-17 MED ORDER — HEPARIN SODIUM (PORCINE) 1000 UNIT/ML DIALYSIS
20.0000 [IU]/kg | INTRAMUSCULAR | Status: DC | PRN
  Administered 2017-04-17: 1800 [IU] via INTRAVENOUS_CENTRAL

## 2017-04-17 MED ORDER — VITAMIN B-1 100 MG PO TABS: 100.0000 mg | ORAL_TABLET | Freq: Every day | ORAL | Status: DC

## 2017-04-17 MED ORDER — PENTAFLUOROPROP-TETRAFLUOROETH EX AERO: 1.0000 "application " | INHALATION_SPRAY | CUTANEOUS | Status: DC | PRN

## 2017-04-17 NOTE — Progress Notes (Signed)
STROKE TEAM PROGRESS NOTE   SUBJECTIVE (INTERVAL HISTORY) No family is at the bedside.  Overall he feels his condition is unchanged. He still intubated, obtunded, not following commands or tracking. No seizure activity. BP 130s, on multiple heavy BP meds. Still has CKD and going to HD today.    OBJECTIVE Temp:  [98 F (36.7 C)-99.7 F (37.6 C)] 98 F (36.7 C) (02/14 1536) Pulse Rate:  [64-88] 87 (02/14 1700) Cardiac Rhythm: Normal sinus rhythm (02/14 1600) Resp:  [17-27] 23 (02/14 1700) BP: (118-159)/(60-74) 135/69 (02/14 1700) SpO2:  [84 %-100 %] 100 % (02/14 1700) FiO2 (%):  [40 %] 40 % (02/14 1700) Weight:  [196 lb 3.4 oz (89 kg)] 196 lb 3.4 oz (89 kg) (02/14 0400)  Recent Labs  Lab 04/17/17 0007 04/17/17 0423 04/17/17 0823 04/17/17 1129 04/17/17 1507  GLUCAP 102* 76 119* 139* 128*   Recent Labs  Lab 04/11/17 1001  04/11/17 2153  04/13/17 0556 04/14/17 0608 04/15/17 0304 04/16/17 0357 04/16/17 1811 04/17/17 0400  NA 145   < > 145   < > 144 148* 146* 146* 148* 148*  K 3.0*   < > 3.2*   < > 4.2 4.5 5.7* 6.2* 4.5 4.4  CL 111   < > 110   < > 112* 115* 115* 116* 116* 114*  CO2 22   < > 22   < > 20* 22 20* 19* 20* 22  GLUCOSE 191*   < > 121*   < > 135* 152* 281* 239* 128* 86  BUN 18   < > 22*   < > 29* 31* 41* 53* 58* 60*  CREATININE 2.54*   < > 3.10*   < > 3.04* 2.95* 3.19* 3.26* 3.31* 3.28*  CALCIUM 8.1*   < > 8.1*   < > 7.8* 8.2* 8.1* 8.2* 7.9* 8.1*  MG 1.8  --  1.8  --  1.7 1.9 1.8  --   --   --   PHOS 2.2*  --  2.0*  --  3.0 3.0  --   --  3.1 3.8   < > = values in this interval not displayed.   Recent Labs  Lab 04/11/17 0140 04/16/17 1811 04/17/17 0400  AST 52*  --   --   ALT 23  --   --   ALKPHOS 119  --   --   BILITOT 0.5  --   --   PROT 5.6*  --   --   ALBUMIN 2.8* 1.8* 1.8*   Recent Labs  Lab 04/11/17 0140 04/14/17 0608 04/15/17 0304 04/16/17 0357  WBC 5.5 8.3 7.7 9.1  NEUTROABS  --  5.9  --   --   HGB 11.0* 9.3* 9.3* 9.1*  HCT 33.5* 29.6*  30.0* 29.7*  MCV 81.7 84.6 85.7 85.3  PLT 214 154 194 189   Recent Labs  Lab 04/11/17 0140 04/11/17 0523 04/11/17 1001  CKTOTAL 97  --   --   TROPONINI  --  0.06* 0.11*   No results for input(s): LABPROT, INR in the last 72 hours. No results for input(s): COLORURINE, LABSPEC, [place], GLUCOSEU, HGBUR, BILIRUBINUR, KETONESUR, PROTEINUR, UROBILINOGEN, NITRITE, LEUKOCYTESUR in the last 72 hours.  Invalid input(s): APPERANCEUR     Component Value Date/Time   CHOL 172 04/17/2017 0830   TRIG 98 04/17/2017 0830   HDL 41 04/17/2017 0830   CHOLHDL 4.2 04/17/2017 0830   VLDL 20 04/17/2017 0830   LDLCALC 111 (H) 04/17/2017 0830  Lab Results  Component Value Date   HGBA1C 11.8 (H) 04/17/2017      Component Value Date/Time   LABOPIA NONE DETECTED 04/11/2017 0447   COCAINSCRNUR POSITIVE (A) 04/11/2017 0447   LABBENZ POSITIVE (A) 04/11/2017 0447   AMPHETMU NONE DETECTED 04/11/2017 0447   THCU NONE DETECTED 04/11/2017 0447   LABBARB NONE DETECTED 04/11/2017 0447    Recent Labs  Lab 04/11/17 0141  ETH <10    I have personally reviewed the radiological images below and agree with the radiology interpretations.  Ct Head Wo Contrast  Result Date: 04/13/2017 CLINICAL DATA:  Focal neuro deficit of less than 6 hours. Stroke suspected. Abnormal left arm movements. Seizures in the setting of polysubstance abuse. EXAM: CT HEAD WITHOUT CONTRAST TECHNIQUE: Contiguous axial images were obtained from the base of the skull through the vertex without intravenous contrast. COMPARISON:  MRI brain and CT head 04/11/2017. 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 The extra-axial collection on the right is slightly more prominent than on the previous studies. It now measures 5.8 mm on coronal images compared with 4.9 mm on the CT scan and less than 3 mm on the CT scan from 2 days ago. There is no midline shift. Moderate white matter disease is again noted bilaterally. There is  progressive white matter hypoattenuation in the posterior left frontal lobe. No definite cortical abnormality is present. Vascular: Vascular calcifications are present within the cavernous internal carotid arteries. There is no hyperdense vessel. Skull: The calvarium is intact. No focal lytic or blastic lesions are present. Sinuses/Orbits: A remote left orbital blowout fracture is present. A single pacified left ethmoid air cell is present. The paranasal sinuses and mastoid air cells are otherwise clear. The globes and orbits are within normal limits. IMPRESSION: 1. Progressive white matter hypoattenuation in the posterior left frontal lobe is concerning for infarct. Infection is considered less likely. There may be some cortical involvement is well. The changes are mostly subcortical. 2. Stable white matter disease on the right. 3. Bilateral subdural hygromas with increased since the previous CT scan. The above was relayed via text pager to Dr. Roland Rack on 04/13/2017 at 16:55 . Electronically Signed   By: San Morelle M.D.   On: 04/13/2017 16:55   Ct Head Wo Contrast  Result Date: 04/11/2017 CLINICAL DATA:  Right-sided weakness and posturing EXAM: CT HEAD WITHOUT CONTRAST TECHNIQUE: Contiguous axial images were obtained from the base of the skull through the vertex without intravenous contrast. COMPARISON:  04/11/2017 FINDINGS: Brain: No evidence of acute infarction, hemorrhage, hydrocephalus, extra-axial collection or mass lesion/mass effect. Diffuse atrophic changes are again noted. Vascular: No hyperdense vessel or unexpected calcification. Skull: Normal. Negative for fracture or focal lesion. Sinuses/Orbits: Sinuses are well aerated. Small mucosal retention cyst is noted within the both the right maxillary antrum and left ethmoid sinuses. No air-fluid level is noted. Other: None IMPRESSION: Chronic atrophic changes without acute abnormality. No significant interval change from the previous  exam is noted. Electronically Signed   By: Inez Catalina M.D.   On: 04/11/2017 07:41   Ct Head Wo Contrast  Result Date: 04/11/2017 CLINICAL DATA:  Altered mental status with seizure EXAM: CT HEAD WITHOUT CONTRAST CT CERVICAL SPINE WITHOUT CONTRAST TECHNIQUE: Multidetector CT imaging of the head and cervical spine was performed following the standard protocol without intravenous contrast. Multiplanar CT image reconstructions of the cervical spine were also generated. COMPARISON:  CT head and C-spine 07/21/2016 FINDINGS: CT HEAD FINDINGS Brain: No acute territorial  infarction, hemorrhage, or intracranial mass is seen. Hypodensity within the periventricular white matter consistent with small-vessel ischemic changes. Stable ventricle size. Slight prominence of extra-axial CSF density at the convexities, could be due to atrophy or tiny chronic subdural effusions. Vascular: No hyperdense vessels. Scattered calcifications at the carotid siphons. Skull: Normal. Negative for fracture or focal lesion. Sinuses/Orbits: Old fracture medial wall left orbit. Mild sinus mucosal disease in the ethmoid sinuses. Small mucous retention cyst right maxillary sinus. No acute orbital abnormality. Other: None CT CERVICAL SPINE FINDINGS Alignment: Straightening of the cervical spine. No subluxation. Facet alignment within normal limits. Skull base and vertebrae: No acute fracture. No primary bone lesion or focal pathologic process. Soft tissues and spinal canal: No prevertebral fluid or swelling. No visible canal hematoma. Disc levels:  Moderate degenerative changes at C5-C6 and C6-C7. Upper chest: Negative. Other: None IMPRESSION: 1. No CT evidence for acute intracranial abnormality. Atrophy and small vessel ischemic changes of the white matter. 2. Straightening of the cervical spine. No definite acute osseous abnormality. Electronically Signed   By: Donavan Foil M.D.   On: 04/11/2017 02:29   Ct Cervical Spine Wo Contrast  Result  Date: 04/11/2017 CLINICAL DATA:  Altered mental status with seizure EXAM: CT HEAD WITHOUT CONTRAST CT CERVICAL SPINE WITHOUT CONTRAST TECHNIQUE: Multidetector CT imaging of the head and cervical spine was performed following the standard protocol without intravenous contrast. Multiplanar CT image reconstructions of the cervical spine were also generated. COMPARISON:  CT head and C-spine 07/21/2016 FINDINGS: CT HEAD FINDINGS Brain: No acute territorial infarction, hemorrhage, or intracranial mass is seen. Hypodensity within the periventricular white matter consistent with small-vessel ischemic changes. Stable ventricle size. Slight prominence of extra-axial CSF density at the convexities, could be due to atrophy or tiny chronic subdural effusions. Vascular: No hyperdense vessels. Scattered calcifications at the carotid siphons. Skull: Normal. Negative for fracture or focal lesion. Sinuses/Orbits: Old fracture medial wall left orbit. Mild sinus mucosal disease in the ethmoid sinuses. Small mucous retention cyst right maxillary sinus. No acute orbital abnormality. Other: None CT CERVICAL SPINE FINDINGS Alignment: Straightening of the cervical spine. No subluxation. Facet alignment within normal limits. Skull base and vertebrae: No acute fracture. No primary bone lesion or focal pathologic process. Soft tissues and spinal canal: No prevertebral fluid or swelling. No visible canal hematoma. Disc levels:  Moderate degenerative changes at C5-C6 and C6-C7. Upper chest: Negative. Other: None IMPRESSION: 1. No CT evidence for acute intracranial abnormality. Atrophy and small vessel ischemic changes of the white matter. 2. Straightening of the cervical spine. No definite acute osseous abnormality. Electronically Signed   By: Donavan Foil M.D.   On: 04/11/2017 02:29   Mr Jodene Nam Head Wo Contrast  Result Date: 04/16/2017 CLINICAL DATA:  67 year old male with right greater than left hemisphere acute watershed type infarcts on  brain MRI today performed for right side weakness. EXAM: MRA HEAD WITHOUT CONTRAST TECHNIQUE: Angiographic images of the Circle of Willis were obtained using MRA technique without intravenous contrast. COMPARISON:  Brain MRI 1127 hr today, and earlier. FINDINGS: No intracranial mass effect or ventriculomegaly. Antegrade flow in the posterior circulation with patent distal vertebral arteries. No distal vertebral stenosis. Normal right PICA origin. Fenestrated vertebrobasilar junction (normal variant). No basilar stenosis. AICA, SCA, and PCA origins are normal. Diminutive left posterior communicating artery, the right is smaller or absent. Bilateral PCA branches are normal. Antegrade flow in both ICA siphons. No siphon stenosis. Normal ophthalmic and left posterior communicating artery origins. Patent carotid termini.  Normal MCA and ACA origins. The right A1 segment appears mildly dominant. Anterior communicating artery and visible bilateral ACA branches are within normal limits. Both MCA M1 segments and MCA bifurcations are patent. The visible right MCA branches are within normal limits. On the left, there is mild to moderate irregularity and stenosis at several proximal M2 branches (series 553, image 13). Similar irregularity and stenosis at some left M3 branch origins. However, no left MCA branch occlusion is identified. IMPRESSION: 1. Positive for mild to moderate irregularity and stenosis of multiple left MCA M2 and M3 branches, but negative for left MCA branch occlusion. 2. Otherwise negative intracranial MRA. Electronically Signed   By: Genevie Ann M.D.   On: 04/16/2017 23:56   Mr Brain Wo Contrast  Result Date: 04/16/2017 CLINICAL DATA:  New right-sided weakness.  Polysubstance abuse. EXAM: MRI HEAD WITHOUT CONTRAST TECHNIQUE: Multiplanar, multiecho pulse sequences of the brain and surrounding structures were obtained without intravenous contrast. COMPARISON:  CT head 04/13/2017, MRI 04/11/2017 FINDINGS:  Brain: Compared with the recent MRI, there are new areas of infarction bilaterally, left greater than right. These appear to be within the watershed territory bilaterally in the deep white matter structures. This corresponds a new area of low-density in the deep white matter on the left on recent CT. No associated hemorrhage or mass. Generalized atrophy. 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 These show interval improvement from 04/11/2017. 3 mm midline shift to the right. Negative for hydrocephalus. Negative for mass lesion. Vascular: Normal arterial flow voids Skull and upper cervical spine: Negative Sinuses/Orbits: The patient is intubated. Mucosal edema throughout the paranasal sinuses and mastoid sinus bilaterally. Normal orbit Other: None IMPRESSION: New areas of acute infarct bilaterally, left greater than right. These appear to be watershed infarcts bilaterally. Negative for hemorrhage. Electronically Signed   By: Franchot Gallo M.D.   On: 04/16/2017 13:15   Mr Brain Wo Contrast  Result Date: 04/11/2017 CLINICAL DATA:  67 year old male with unexplained altered mental status and seizures. Hyperglycemia. EXAM: MRI HEAD WITHOUT CONTRAST TECHNIQUE: Multiplanar, multiecho pulse sequences of the brain and surrounding structures were obtained without intravenous contrast. COMPARISON:  Head CT without contrast 0707 hr today and earlier. Brain MRI 07/22/2016 FINDINGS: Brain: Bilateral CSF isointense subdural fluid collections are new since May 2018, measuring about 5 millimeters in thickness bilaterally (series 13, image 17, series 16, image 16). No midline shift. Minimal associated hemisphere mass effect. No acute intracranial hemorrhage identified. No restricted diffusion to suggest acute infarction. No midline shift, mass effect, evidence of mass lesion, or ventriculomegaly. Cervicomedullary junction and pituitary are within normal limits. Patchy and confluent bilateral cerebral white  matter T2 and FLAIR hyperintensity is stable since 2018. No cortical encephalomalacia or chronic cerebral blood products identified. Deep gray matter nuclei are stable and within normal limits. Brainstem and cerebellum appear negative. No new signal abnormality identified. Vascular: Major intracranial vascular flow voids are stable. Skull and upper cervical spine: Negative visible cervical spine. Normal bone marrow signal. Sinuses/Orbits: Stable and negative. Other: Visible internal auditory structures appear normal. Mastoids are clear. There is a small volume of fluid layering in the nasopharynx. Scalp and face soft tissues appear negative. IMPRESSION: 1. Small, symmetric bilateral subdural CSF hygromas are new since the 2018 MRI, measuring 5 mm each. No midline shift and minimal associated intracranial mass effect. 2. No acute intracranial hemorrhage. No other acute intracranial abnormality. 3. Chronic nonspecific cerebral white matter signal changes. Electronically Signed   By: Genevie Ann  M.D.   On: 04/11/2017 09:57   US Renal  Result Date: 04/16/2017 CLINICAL DATA:  Acute renal failure EXAM: RENAL / URINARY TRACT ULTRASOUND COMPLETE COMPARISON:  None. FINDINGS: Right Kidney: Length: 10.5 cm. Increased renal parenchymal echogenicity. No hydronephrosis. Left Kidney: Length: 12.2 cm. Increased renal parenchymal echogenicity. No hydronephrosis. Left renal cyst measures 1.3 x 1.3 x 1.3 cm. Bladder: Appears normal for degree of bladder distention. There is a Foley catheter present. Other: Small pleural effusions. IMPRESSION: 1. No hydronephrosis. Increased renal parenchymal echogenicity is consistent with chronic medical renal disease. 2. Incidentally visualized small pleural effusions. Electronically Signed   By: Ulyses Jarred M.D.   On: 04/16/2017 21:38   Dg Chest Port 1 View  Result Date: 04/16/2017 CLINICAL DATA:  Central line placement ETT present Hx of HTN, DM2, stroke EXAM: PORTABLE CHEST 1 VIEW  COMPARISON:  04/15/2017 FINDINGS: Interval placement of a RIGHT central venous line tip in distal SVC. No pneumothorax. Endotracheal tube good position. Introduction of a NG tube which extends the stomach. Mild RIGHT basilar atelectasis which is new.  No pulmonary edema. IMPRESSION: 1. Introduction of RIGHT central venous line without pneumothorax. 2. Endotracheal tube in good position.  NG tube in stomach. 3. Increasing RIGHT basilar atelectasis/edema. Electronically Signed   By: Suzy Bouchard M.D.   On: 04/16/2017 16:09   Dg Chest Port 1 View  Result Date: 04/15/2017 CLINICAL DATA:  67 year old male with history of acute respiratory failure. Shortness of breath. EXAM: PORTABLE CHEST 1 VIEW COMPARISON:  Chest x-ray 04/14/2017. FINDINGS: An endotracheal tube is in place with tip 5.1 cm above the carina. Lung volumes are normal. No consolidative airspace disease. No pleural effusions. No pneumothorax. No pulmonary nodule or mass noted. Pulmonary vasculature and the cardiomediastinal silhouette are within normal limits. Atherosclerosis in the thoracic aorta. Small metallic density again projects over the mid right hemithorax. IMPRESSION: 1. Tip of endotracheal tube is 5.1 cm above the carina. 2. No radiographic evidence of acute cardiopulmonary disease. 3. Aortic atherosclerosis. Electronically Signed   By: Vinnie Langton M.D.   On: 04/15/2017 07:47   Dg Chest Port 1 View  Result Date: 04/14/2017 CLINICAL DATA:  Respiratory failure EXAM: PORTABLE CHEST 1 VIEW COMPARISON:  Yesterday FINDINGS: Endotracheal tube tip between the clavicular heads and carina. An orogastric tube reaches the stomach which still contains gas. There is no edema, consolidation, effusion, or pneumothorax. Mild streaky opacity behind the heart. Mild cardiomegaly. Carotid metallic foreign body over the right chest. IMPRESSION: 1. Mild streaky retrocardiac opacity favoring atelectasis. 2. Endotracheal and orogastric tubes in good  position. 3. Continued gaseous distention of the stomach. Electronically Signed   By: Monte Fantasia M.D.   On: 04/14/2017 06:49   Dg Chest Port 1 View  Result Date: 04/13/2017 CLINICAL DATA:  Respiratory failure EXAM: PORTABLE CHEST 1 VIEW COMPARISON:  04/12/2017 and prior exams FINDINGS: An endotracheal tube with tip 5.3 cm above the carina, and NG tube entering the stomach again noted. Cardiomegaly identified. There is no evidence of focal airspace disease, pulmonary edema, effusion, or pneumothorax. No acute bony abnormalities are identified. IMPRESSION: Cardiomegaly without evidence of acute cardiopulmonary disease. Electronically Signed   By: Margarette Canada M.D.   On: 04/13/2017 07:53   Portable Chest Xray  Result Date: 04/12/2017 CLINICAL DATA:  Endotracheal tube present EXAM: PORTABLE CHEST 1 VIEW COMPARISON:  Yesterday FINDINGS: Endotracheal tube tip just below the clavicular heads. An orogastric tube reaches the stomach with side port at the GE junction. Improved aeration.  No visible effusion or pneumothorax. Borderline heart size. Artifact from EKG leads. IMPRESSION: 1. Unremarkable positioning of endotracheal and orogastric tubes. 2. Improved aeration. Electronically Signed   By: Monte Fantasia M.D.   On: 04/12/2017 07:06   Portable Chest X-ray  Result Date: 04/11/2017 CLINICAL DATA:  Endotracheal tube placement. EXAM: PORTABLE CHEST 1 VIEW COMPARISON:  Chest radiograph performed earlier today at 1:41 a.m. FINDINGS: The patient's endotracheal tube is seen ending 3-4 cm above the carina. An enteric tube is noted extending below the diaphragm. Vascular congestion is noted. Right perihilar airspace opacity may reflect asymmetric interstitial edema, new from the prior study. No pleural effusion or pneumothorax is seen. The cardiomediastinal silhouette is normal in size. No acute osseous abnormalities are identified. IMPRESSION: 1. Endotracheal tube seen ending 3-4 cm above the carina. 2. Vascular  congestion. Right perihilar airspace opacity is new from the prior study and may reflect asymmetric interstitial edema. Electronically Signed   By: Garald Balding M.D.   On: 04/11/2017 06:07   Dg Chest Portable 1 View  Result Date: 04/11/2017 CLINICAL DATA:  Status post seizure. EXAM: PORTABLE CHEST 1 VIEW COMPARISON:  Chest radiograph performed 07/24/2016 FINDINGS: The lungs are well-aerated and clear. There is no evidence of focal opacification, pleural effusion or pneumothorax. The cardiomediastinal silhouette is borderline normal in size. No acute osseous abnormalities are seen. IMPRESSION: No acute cardiopulmonary process seen. Electronically Signed   By: Garald Balding M.D.   On: 04/11/2017 02:07   Dg Abd Portable 1v  Result Date: 04/15/2017 CLINICAL DATA:  Status post OG tube placement. EXAM: PORTABLE ABDOMEN - 1 VIEW COMPARISON:  None. FINDINGS: OG tube is looped in the fundus of the stomach. Both the tip and side-port are in the stomach. Bowel gas pattern is non obstructive. Prominent stool ascending colon noted. IMPRESSION: OG tube in good position.  No acute finding. Electronically Signed   By: Inge Rise M.D.   On: 04/15/2017 13:48   Vas US Carotid  Result Date: 04/15/2017 Carotid Arterial Duplex Study Indications:   CVA and Follow up left common carotid artery stenosis. Risk Factors:  Hypertension, hyperlipidemia. Other Factors: Type II diabetes mellitus. Examination Guidelines: A complete evaluation includes B-mode imaging, spectral doppler, color doppler, and power doppler as needed of all accessible portions of each vessel. Bilateral testing is considered an integral part of a complete examination. Limited examinations for reoccurring indications may be performed as noted.  Right Carotid Findings: +----------+--------+--------+--------+------------------+--------+           PSV cm/sEDV cm/sStenosisDescribe          Comments  +----------+--------+--------+--------+------------------+--------+ CCA Prox  -217    -26             diffuse and smooth         +----------+--------+--------+--------+------------------+--------+ CCA Mid                           diffuse and smooth         +----------+--------+--------+--------+------------------+--------+ CCA Distal-218    -27                                        +----------+--------+--------+--------+------------------+--------+ ICA Prox  -177    -29             heterogenous               +----------+--------+--------+--------+------------------+--------+  ICA Distal-128    -36                                        +----------+--------+--------+--------+------------------+--------+ ECA       -255    -22             diffuse and smooth         +----------+--------+--------+--------+------------------+--------+ +----------+--------+-------+----------------+-------------------+           PSV cm/sEDV cmsDescribe        Arm Pressure (mmHG) +----------+--------+-------+----------------+-------------------+ Subclavian260            Multiphasic, WNL                    +----------+--------+-------+----------------+-------------------+ +---------+--------+--+--------+--+---------+ VertebralPSV cm/s89EDV cm/s17Antegrade +---------+--------+--+--------+--+---------+  Left Carotid Findings: +----------+--------+--------+--------+--------------------------+--------+           PSV cm/sEDV cm/sStenosisDescribe                  Comments +----------+--------+--------+--------+--------------------------+--------+ CCA Prox  374     64      >50%    diffuse and smooth                 +----------+--------+--------+--------+--------------------------+--------+ CCA Distal479     66      >50%    diffuse and smooth                 +----------+--------+--------+--------+--------------------------+--------+ ICA Prox  -213    -30              heterogenous and irregular         +----------+--------+--------+--------+--------------------------+--------+ ICA Distal-99     -18                                                +----------+--------+--------+--------+--------------------------+--------+ ECA       -393    -31             heterogenous                       +----------+--------+--------+--------+--------------------------+--------+ +----------+--------+--------+--------+-------------------+ SubclavianPSV cm/sEDV cm/sDescribeArm Pressure (mmHG) +----------+--------+--------+--------+-------------------+           451             Stenotic                    +----------+--------+--------+--------+-------------------+ +---------+--------+--+--------+--+-----------------+ VertebralPSV cm/s29EDV cm/s12Atypical waveform +---------+--------+--+--------+--+-----------------+  Final Interpretation: Right Carotid: Elevated common carotid artery velocities, along with plaque                morphology suggest hemodynamically significant stenosis versus                proximal obstruction. Elevated right internal carotid artery                velocities suggest low range 40-59% stenosis, however this is                likely due to proximal obstruction given lack of significant                plaque morphology. Left Carotid: Elevated common carotid artery velocities, along with significant               plaque morphology is suggestive of >  50% stenosis. This is               consistent with previous findings by CTA and ultrasound. Elevated               left internal carotid artery velocities suggest low range 60-79%               stenosis, however this may be due to proximal obstruction given               lack of significant plaque morphology. Vertebrals:  Right vertebral artery was patent with antegrade flow. Left              vertebral artery exhibits atypical waveform, suggestive of possible              proximal  obstruction. Subclavians: Left subclavian artery was moderately stenotic. Normal flow              hemodynamics were seen in the right subclavian artery. Correlate              with CT angio if clinically necessary *See table(s) above for measurements and observations.  Electronically signed by Antony Contras on 04/15/2017 at 8:11:18 AM.   Carotid Doppler  Right Carotid: Elevated common carotid artery velocities, along with plaque        morphology suggest hemodynamically significant stenosis versus        proximal obstruction. Elevated right internal carotid artery        velocities suggest low range 40-59% stenosis, however this is        likely due to proximal obstruction given lack of significant        plaque morphology.  Left Carotid: Elevated common carotid artery velocities, along with significant plaque morphology is suggestive of >50% stenosis. This is       consistent with previous findings by CTA and ultrasound. Elevated       left internal carotid artery velocities suggest low range 60-79%       stenosis, however this may be due to proximal obstruction givenlack of significant plaque morphology.  Vertebrals: Right vertebral artery was patent with antegrade flow. Left       vertebral artery exhibits atypical waveform, suggestive of possible       proximal obstruction. Subclavians: Left subclavian artery was moderately stenotic. Normal flow       hemodynamics were seen in the right subclavian artery. Correlate       with CT angio if clinically necessary  TTE - Left ventricle: The cavity size was normal. Wall thickness was   increased in a pattern of moderate LVH. There was severe   concentric hypertrophy. Systolic function was normal. The   estimated ejection fraction was in the range of 60% to 65%. Wall   motion was normal; there were no regional wall motion   abnormalities. Doppler  parameters are consistent with abnormal   left ventricular relaxation (grade 1 diastolic dysfunction). The   E/e&' ratio is between 8-15, suggesting indeterminate LV filling   pressure. - Aortic valve: Trileaflet. There was no stenosis. There was no   regurgitation. - Mitral valve: Mildly thickened leaflets . There was mild   regurgitation. - Left atrium: The atrium was normal in size. - Inferior vena cava: The vessel was dilated. The respirophasic   diameter changes were blunted (< 50%), consistent with elevated   central venous pressure. On ventilator.   PHYSICAL EXAM  Temp:  [98 F (36.7 C)-99.7 F (  37.6 C)] 98 F (36.7 C) (02/14 1536) Pulse Rate:  [64-88] 87 (02/14 1700) Resp:  [17-27] 23 (02/14 1700) BP: (118-159)/(60-74) 135/69 (02/14 1700) SpO2:  [84 %-100 %] 100 % (02/14 1700) FiO2 (%):  [40 %] 40 % (02/14 1700) Weight:  [196 lb 3.4 oz (89 kg)] 196 lb 3.4 oz (89 kg) (02/14 0400)  General - Well nourished, well developed, intubated  Ophthalmologic - fundi not visualized due to noncooperation.  Cardiovascular - Regular rate and rhythm  Neuro - obtunded, eyes open but not blinking to visual threat, not tracking objects.  Intubated, not following commands.  PERRL, positive doll's eyes, positive corneal and gag reflexes.  Facial symmetry not able to test due to ET tube.  Tongue midline inside mouth.  On pain stimulation, bilateral upper extremities mild withdrawal, no movement of bilateral lower extremities.  DTR 1+, no Babinski. Sensation, coordination and gait not tested.   ASSESSMENT/PLAN Richard Bennett is a 67 y.o. male with history of hypertension, hyperlipidemia, diabetes and bilateral carotid stenosis admitted for seizure. No tPA given due to out of window.    Stroke:  bilateral periventricular and watershed area infarcts, L>R, likely secondary to bilateral carotid stenosis in the setting of episodic hypotension with hypertensive emergency  Resultant obtunded,  intubated  MRI  L>R multifocal periventricular and watershed area infarcts bilaterally  MRA mild to moderate left M2 M3 branches stenosis  Carotid Doppler right ICA 40-59%, left CCA > 50%, left ICA 60-79% stenosis  2D Echo EF 60-65%  LDL 111  HgbA1c 11.8  Heparin subq for VTE prophylaxis Diet NPO time specified  Seizure precautions   aspirin 325 mg daily prior to admission, now on aspirin 325 mg daily.   Ongoing aggressive stroke risk factor management  Therapy recommendations: Pending  Disposition: Pending  Seizure  Admitted on 07/22/16 for altered mental status and seizure, found to have a severe hyperglycemia and severe hypertensive emergency.  CT negative, EEG negative for seizure.    This time again admitted for seizure and altered mental status, found to have severe hyperglycemia and severe hypertension.  Again EEG negative, however found to have a simple partial seizure  Currently on Keppra and Vimpat  Bilateral carotid stenosis  In 07/2016 MRI showed left watershed, left MCA/ACA infarcts.  Carotid Doppler left CCA >50%, left ICA 22-29% systolic and 79-89% diastolic.  08/2016 carotid Doppler showed left CCA thrombus  09/2016 CTA showed left CCA smooth long segment plaque with 70% stenosis, left subclavian artery 60-70% stenosis left VA severe stenosis  Follow with the [facility] as outpatient, recommended medical treatment  On aspirin and Lipitor at home  This admission carotid Doppler again left CCA > 50%, left ICA 60-79%, and right ICA 40-59% stenosis  Cocaine abuse  UDS positive for cocaine this time  Cocaine cessation education will be provided  Respiratory distress  Intubated  CCM on board  Diabetes  HgbA1c 11.8 goal < 7.0  Uncontrolled  Currently on Lantus  CBG monitoring  SSI  DM education and close PCP follow up  Hypertension Unstable On multiple p.o. and IV BP medications including amlodipine 10 mg, clonidine 0.2 mg twice daily, Lasix  120 every 6 hours, labetalol 200 mg every 8 hours as well as Cardene drip  BP goal 140-160 due to watershed infarcts with bilateral carotid stenosis  Hyperlipidemia  Home meds: Lipitor 80  LDL 111, goal < 70  Now on Lipitor 40  Continue statin at discharge  AKI on CKD  Elevated creatinine  On HD today  CCM following  Other Stroke Risk Factors  Advanced age  Former cigarette smoker, quit 39 years ago  ETOH use  Polysubstance abuse including heroine and cocaine  Other Active Problems  Anemia of chronic disease  Hospital day # 6  This patient is critically ill due to seizure, uncontrolled diabetes, seizure, uncontrolled hypertension, carotid stenosis bilaterally, watershed strokes and at significant risk of neurological worsening, death form recurrent strokes, hemorrhagic conversion, DKA, heart failure, status epilepticus. This patient's care requires constant monitoring of vital signs, hemodynamics, respiratory and cardiac monitoring, review of multiple databases, neurological assessment, discussion with family, other specialists and medical decision making of high complexity.  I also discussed with Dr. Lamonte Sakai. I spent 40 minutes of neurocritical care time in the care of this patient.  Rosalin Hawking, MD PhD Stroke Neurology 04/17/2017 6:24 PM    To contact Stroke Continuity provider, please refer to [email or web address] After hours, contact General Neurology

## 2017-04-17 NOTE — Progress Notes (Signed)
PULMONARY / CRITICAL CARE MEDICINE   Name: Richard Bennett MRN: [id number] DOB: December 14, 1950    ADMISSION DATE:  04/11/2017 CONSULTATION DATE: 04/11/17  REFERRING MD: ER MD  CHIEF COMPLAINT: Seizures, HTN emergency, Nonketotic hyperglycemia  HISTORY OF PRESENT ILLNESS:   5yoM with hx CVA, DM, HTN, presented to the ER w/ acute encephalopathy and seizures. Initially patient was wandering down hallway of apartment and police were called. EMS arrived to find patient in a bathtub initially unresponsive then combative. He had a seizure during this process and was given versed 5mg . Patient transported to [facility] ER where blood glucose 715 initially and patient with acute encephalopathy. Patient had a head lac that was sutured in the ER. He ha a 2nd seizure in the ER. He continued to be altered combative following this seizure, repeatedly trying to climb out of bed. He received a total of 9mg  Versed and 4mg  Ativan while in the ER. He continued to be agitated and was intubated by ER staff.   SUBJECTIVE:  More awake today  Will follow commands Appears comfortable on PSV   VITAL SIGNS: BP 131/67   Pulse 81   Temp 98.3 F (36.8 C) (Oral)   Resp 20   Ht 5\' 8"  (1.727 m)   Wt 196 lb 3.4 oz (89 kg)   SpO2 100%   BMI 29.83 kg/m   VENTILATOR SETTINGS: Vent Mode: CPAP;PSV FiO2 (%):  [40 %] 40 % Set Rate:  [20 bmp] 20 bmp Vt Set:  [550 mL] 550 mL PEEP:  [5 cmH20] 5 cmH20 Pressure Support:  [5 cmH20] 5 cmH20 Plateau Pressure:  [14 cmH20-20 cmH20] 17 cmH20  INTAKE / OUTPUT:  Intake/Output Summary (Last 24 hours) at 04/17/2017 1134 Last data filed at 04/17/2017 1100 Gross per 24 hour  Intake 3935.97 ml  Output 3145 ml  Net 790.97 ml     PHYSICAL EXAMINATION: General: 67 year old male resting comfortably on PSV,  HEENT: NCAT, no JVD orally intubated  Pulmonary: scattered rhonchi no accessory use  Cardiac: RRR no MRG Abdomen soft not tender  Extremities/musculoskeletal: anasarca. Right sided  hemiparesis  Neuro: more awake, follows commands. Weak on left hemiparetic on right LABS:  BMET Recent Labs  Lab 04/16/17 0357 04/16/17 1811 04/17/17 0400  NA 146* 148* 148*  K 6.2* 4.5 4.4  CL 116* 116* 114*  CO2 19* 20* 22  BUN 53* 58* 60*  CREATININE 3.26* 3.31* 3.28*  GLUCOSE 239* 128* 86   Electrolytes Recent Labs  Lab 04/13/17 0556 04/14/17 0608 04/15/17 0304 04/16/17 0357 04/16/17 1811 04/17/17 0400  CALCIUM 7.8* 8.2* 8.1* 8.2* 7.9* 8.1*  MG 1.7 1.9 1.8  --   --   --   PHOS 3.0 3.0  --   --  3.1 3.8   CBC Recent Labs  Lab 04/14/17 0608 04/15/17 0304 04/16/17 0357  WBC 8.3 7.7 9.1  HGB 9.3* 9.3* 9.1*  HCT 29.6* 30.0* 29.7*  PLT 154 194 189   Coag's Recent Labs  Lab 04/11/17 0140  INR 0.91   Sepsis Markers Recent Labs  Lab 04/11/17 1001 04/11/17 2153  LATICACIDVEN 4.2* 2.9*  PROCALCITON 0.37  --     ABG Recent Labs  Lab 04/11/17 0521 04/11/17 0625  PHART 7.431 7.414  PCO2ART 34.8 39.4  PO2ART 82.0* 403.0*   Liver Enzymes Recent Labs  Lab 04/11/17 0140 04/16/17 1811 04/17/17 0400  AST 52*  --   --   ALT 23  --   --   ALKPHOS 119  --   --  BILITOT 0.5  --   --   ALBUMIN 2.8* 1.8* 1.8*   Cardiac Enzymes Recent Labs  Lab 04/11/17 0523 04/11/17 1001  TROPONINI 0.06* 0.11*    Glucose Recent Labs  Lab 04/16/17 1508 04/16/17 2027 04/17/17 0007 04/17/17 0423 04/17/17 0823 04/17/17 1129  GLUCAP 124* 107* 102* 76 119* 139*   Imaging  SIGNIFICANT EVENTS: 2/8: presented to ER following acute encephalopathy, witnessed sz with EMS and 2nd sz in ER, hyperglycemia, and HTN emergency 04/12/2017 started on Dilantin per neurology  04/13/2017 or sedated and now with right hemiparesis for an MRI   LINES/TUBES: 2-PIV ETT 2/8 >> OG tube 2/8 >> Condom catheter; Foley ordered 04/13/2017 Foley placed by urology secondary to urethral stricture  DISCUSSION: New strokes. Also w/ new AKI. Will need HD. Difficult to prognosticate his  neurological recovery in the face of aki.    ASSESSMENT / PLAN:  Acute encephalpathy; Seizures;  Cocaine abuse;  Acute on chronic CVA felt to be watershed in nature  H/o carotid artery stenosis (c/w prior studies) F/u MRI:  New areas of acute infarct bilaterally, left greater than right. These appear to be watershed infarcts bilaterally. Negative for Hemorrhage. Eeg: no seizure  Hemodynamically significant  MRA: Positive for mild to moderate irregularity and stenosis of multiple left MCA M2 and M3 branches, but negative for left MCA branch occlusion. 2. Otherwise negative intracranial MRA. Plan Cont Keppra and vimpat Cont asa and lipitor  BP goal shooting for 140 to 160 -->see if we can stop cardene   Intubated for airway protection.  Ventilator dependent  Possible aspiration -PCXR personally reviewed: tubes/lines in good position. Right basilar atx -MS not supporting extubation -->hoping w/ HD we may be able to  Plan  PSV as tolerated Volume removal w/ HD cxr PRN PAD protocol RASS goal 0 to -1 Re-assess daily for extubation  Day 7/7 unasyn   HTN emergency; hx HTN Plan cardene gtt for SBP goal 140-160 Cont labetolol and clonidine For HD today so will hold off on further BP titration for now   AKI-on-CKD w/ hyperkalemia and NAG metabolic acidosis Urethral stricture -scr seems to have hit plateau but appears volume overloaded and not making much urine Plan Foley to stay and be addressed after dcKeep foley in place. Will f/u w/ urology Avoid nephrotoxins BP goal 140-160 Strict I&O For HD today   Fluid and electrolyte imbalance:  Hyperkalemia, hypernatremia and hyperchloremia  Plan For HD today F/u am chemistry  Add free water vt   Anemia: HE IS JEHOVAH WiTNESS  Plan Trend cbc No transfusions   Nonketotic Hyperglycemia vs DKA ->still w/ hyperglycemia Plan ssi  Cont lantus as well as basal dosing short acting   DVT prophylaxis: [place] heparin  SUP: ppi  Diet:  tubefeed Activity: BR Disposition : ICU FAMILY  - Updates: No family at bedside.  We will need to reach out to them given his debilitation, prognosis. - Inter-disciplinary family meet or Palliative Care meeting due by: 04/18/17  cct 40 min   Erick Colace ACNP-BC [place] Pager # [phone] OR # [phone] if no answer

## 2017-04-17 NOTE — Progress Notes (Signed)
RT transported pt to and from MRI without event. RT will continue to monitor as needed.

## 2017-04-17 NOTE — Progress Notes (Signed)
Pt very awake - not following commands but moving Rt arm - had not seen pt moving his Rt arm all shift. Off Cardene gtt since 1600 - using PRN meds to keep SBP near 140 - see MAR

## 2017-04-17 NOTE — Progress Notes (Signed)
Richard Bennett Progress Note    Assessment/ Plan:   1.  Hyperkalemia - improved with medical interventions at 4.4 from 6.2.  No EKG changes noted.  Likely secondary to worsening renal function and inability to clear potassium.  -Lasix 120 mg 3 times daily for potassium clearance -Insulin already ordered in the setting of elevated blood sugars -Kayexalate 60 mg  - HD cath placed incase of need for emergent HD overnight, however K improved medically  2.  AKI/CKD stage 3 - BL creatinine 1.5-1.9 1 year ago. Cr stable at 3.28 from 3.29 today, BUN 60 from 53.  Likely in the setting of hypertensive emergency and stroke. - Renal U/S showed no hydronephrosis. Notable for  increased renal parenchymal echogenicity and a L renal cyst 1.3x1.3x1.3cm  Will plan for HD to help with uremia and see if his cognitive status improves.   3. Resistant hypertension -patient currently permissive hypertension to 063 systolic per neurology recommendations.  He has no new acute brain infarct.  Currently with 4 agents on board labetalol, Norvasc, metoprolol, nicardipine, Catapres.   Permissive hypertension may be somewhat beneficial to kidney function at this point given that rapid reduction in blood pressure may cause further renal injury.  4. Acute CVA/seizures  - neurology on board. R>L new infarcts. Als obil subdural hygromas, no mass effect. - keppra, vimpat hanging  5.  Type 2 diabetes -managed per primary team, insulin-dependent  6. Anemia - presumably from acute illness but may have some underlying anemia of CKD stage 3.  His hgb has fluctuated several times from 5.5 to 9.1.  Continue to monitor   Subjective:   Patient rests in bed, intubated/mechanically ventilated. Opens eyes to stimulation per nurse at bedside.   No acute events overnight Objective:   BP (!) 143/72   Pulse 80   Temp 99.7 F (37.6 C) (Oral)   Resp (!) 21   Ht '5\' 8"'$  (1.727 m)   Wt 196 lb 3.4 oz (89 kg)   SpO2 100%    BMI 29.83 kg/m   Intake/Output Summary (Last 24 hours) at 04/17/2017 0920 Last data filed at 04/17/2017 0160 Gross per 24 hour  Intake 3818.47 ml  Output 2745 ml  Net 1073.47 ml   Weight change: 4 lb 13.6 oz (2.2 kg)  Physical Exam: Gen: NAD, ET tube in place CVS: RRR, no m/r/g Resp:CTA anteriorly, no W/R/R FUX:NATF, nondistended Ext: +UE edema in hands, 1+ LE edema bil  Imaging: [name] TD Contrast  Result Date: 04/16/2017 CLINICAL DATA:  67 year old male with right greater than left hemisphere acute watershed type infarcts on brain MRI today performed for right side weakness. EXAM: MRA HEAD WITHOUT CONTRAST TECHNIQUE: Angiographic images of the Circle of Willis were obtained using MRA technique without intravenous contrast. COMPARISON:  Brain MRI 1127 hr today, and earlier. FINDINGS: No intracranial mass effect or ventriculomegaly. Antegrade flow in the posterior circulation with patent distal vertebral arteries. No distal vertebral stenosis. Normal right PICA origin. Fenestrated vertebrobasilar junction (normal variant). No basilar stenosis. AICA, SCA, and PCA origins are normal. Diminutive left posterior communicating artery, the right is smaller or absent. Bilateral PCA branches are normal. Antegrade flow in both ICA siphons. No siphon stenosis. Normal ophthalmic and left posterior communicating artery origins. Patent carotid termini. Normal MCA and ACA origins. The right A1 segment appears mildly dominant. Anterior communicating artery and visible bilateral ACA branches are within normal limits. Both MCA M1 segments and MCA bifurcations are patent. The visible right MCA  branches are within normal limits. On the left, there is mild to moderate irregularity and stenosis at several proximal M2 branches (series 553, image 13). Similar irregularity and stenosis at some left M3 branch origins. However, no left MCA branch occlusion is identified. IMPRESSION: 1. Positive for mild to moderate  irregularity and stenosis of multiple left MCA M2 and M3 branches, but negative for left MCA branch occlusion. 2. Otherwise negative intracranial MRA. Electronically Signed   By: Genevie Ann M.D.   On: 04/16/2017 23:56   Mr Brain Wo Contrast  Result Date: 04/16/2017 CLINICAL DATA:  New right-sided weakness.  Polysubstance abuse. EXAM: MRI HEAD WITHOUT CONTRAST TECHNIQUE: Multiplanar, multiecho pulse sequences of the brain and surrounding structures were obtained without intravenous contrast. COMPARISON:  CT head 04/13/2017, MRI 04/11/2017 FINDINGS: Brain: Compared with the recent MRI, there are new areas of infarction bilaterally, left greater than right. These appear to be within the watershed territory bilaterally in the deep white matter structures. This corresponds a new area of low-density in the deep white matter on the left on recent CT. No associated hemorrhage or mass. Generalized atrophy. 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 These show interval improvement from 04/11/2017. 3 mm midline shift to the right. Negative for hydrocephalus. Negative for mass lesion. Vascular: Normal arterial flow voids Skull and upper cervical spine: Negative Sinuses/Orbits: The patient is intubated. Mucosal edema throughout the paranasal sinuses and mastoid sinus bilaterally. Normal orbit Other: None IMPRESSION: New areas of acute infarct bilaterally, left greater than right. These appear to be watershed infarcts bilaterally. Negative for hemorrhage. Electronically Signed   By: Franchot Gallo M.D.   On: 04/16/2017 13:15   US Renal  Result Date: 04/16/2017 CLINICAL DATA:  Acute renal failure EXAM: RENAL / URINARY TRACT ULTRASOUND COMPLETE COMPARISON:  None. FINDINGS: Right Kidney: Length: 10.5 cm. Increased renal parenchymal echogenicity. No hydronephrosis. Left Kidney: Length: 12.2 cm. Increased renal parenchymal echogenicity. No hydronephrosis. Left renal cyst measures 1.3 x 1.3 x 1.3 cm. Bladder:  Appears normal for degree of bladder distention. There is a Foley catheter present. Other: Small pleural effusions. IMPRESSION: 1. No hydronephrosis. Increased renal parenchymal echogenicity is consistent with chronic medical renal disease. 2. Incidentally visualized small pleural effusions. Electronically Signed   By: Ulyses Jarred M.D.   On: 04/16/2017 21:38   Dg Chest Port 1 View  Result Date: 04/16/2017 CLINICAL DATA:  Central line placement ETT present Hx of HTN, DM2, stroke EXAM: PORTABLE CHEST 1 VIEW COMPARISON:  04/15/2017 FINDINGS: Interval placement of a RIGHT central venous line tip in distal SVC. No pneumothorax. Endotracheal tube good position. Introduction of a NG tube which extends the stomach. Mild RIGHT basilar atelectasis which is new.  No pulmonary edema. IMPRESSION: 1. Introduction of RIGHT central venous line without pneumothorax. 2. Endotracheal tube in good position.  NG tube in stomach. 3. Increasing RIGHT basilar atelectasis/edema. Electronically Signed   By: Suzy Bouchard M.D.   On: 04/16/2017 16:09   Dg Abd Portable 1v  Result Date: 04/15/2017 CLINICAL DATA:  Status post OG tube placement. EXAM: PORTABLE ABDOMEN - 1 VIEW COMPARISON:  None. FINDINGS: OG tube is looped in the fundus of the stomach. Both the tip and side-port are in the stomach. Bowel gas pattern is non obstructive. Prominent stool ascending colon noted. IMPRESSION: OG tube in good position.  No acute finding. Electronically Signed   By: Inge Rise M.D.   On: 04/15/2017 13:48    Labs: [employer]  04/11/17 1001  04/11/17 2153 04/12/17 1146 04/13/17 0556 04/14/17 0608 04/15/17 0304 04/16/17 0357 04/16/17 1811 04/17/17 0400  NA 145   < > 145 145 144 148* 146* 146* 148* 148*  K 3.0*   < > 3.2* 3.7 4.2 4.5 5.7* 6.2* 4.5 4.4  CL 111   < > 110 114* 112* 115* 115* 116* 116* 114*  CO2 22   < > 22 23 20* 22 20* 19* 20* 22  GLUCOSE 191*   < > 121* 235* 135* 152* 281* 239* 128* 86  BUN 18    < > 22* 27* 29* 31* 41* 53* 58* 60*  CREATININE 2.54*   < > 3.10* 3.30* 3.04* 2.95* 3.19* 3.26* 3.31* 3.28*  CALCIUM 8.1*   < > 8.1* 7.8* 7.8* 8.2* 8.1* 8.2* 7.9* 8.1*  PHOS 2.2*  --  2.0*  --  3.0 3.0  --   --  3.1 3.8   < > = values in this interval not displayed.   CBC Recent Labs  Lab 04/11/17 0140 04/14/17 0608 04/15/17 0304 04/16/17 0357  WBC 5.5 8.3 7.7 9.1  NEUTROABS  --  5.9  --   --   HGB 11.0* 9.3* 9.3* 9.1*  HCT 33.5* 29.6* 30.0* 29.7*  MCV 81.7 84.6 85.7 85.3  PLT 214 154 194 189    Medications:    . amLODipine  10 mg Per Tube Daily  . aspirin  325 mg Per Tube Daily  . atorvastatin  40 mg Per Tube q1800  . chlorhexidine gluconate (MEDLINE KIT)  15 mL Mouth Rinse BID  . Chlorhexidine Gluconate Cloth  6 each Topical Daily  . cloNIDine  0.2 mg Per Tube BID  . heparin  5,000 Units Subcutaneous Q8H  . insulin aspart  0-20 Units Subcutaneous Q4H  . insulin aspart  4 Units Subcutaneous Q4H  . insulin glargine  25 Units Subcutaneous Daily  . labetalol  200 mg Per Tube Q8H  . mouth rinse  15 mL Mouth Rinse 10 times per day  . pantoprazole sodium  40 mg Per Tube Daily  . sodium chloride flush  10-40 mL Intracatheter Q12H  . thiamine  100 mg Intravenous Daily    Richard Coombe, MD PGY2 04/17/2017, 9:20 AM   I have seen and examined this patient and agree with plan and assessment in the above note with renal recommendations/intervention highlighted.  Will plan for short initial session of HD due to progressive azotemia and possible improvement of cognition with better clearance.   Richard John A Osha Rane,MD 04/17/2017 12:11 PM

## 2017-04-17 NOTE — Progress Notes (Signed)
Nutrition Follow-up  DOCUMENTATION CODES:   Not applicable  INTERVENTION:    Continue Vital AF 1.2 via OGT at 70 ml/h (1680 ml/day) to provide 2016 kcals, 126 gm protein, 1362 ml free water daily.  NUTRITION DIAGNOSIS:   Inadequate oral intake related to inability to eat as evidenced by NPO status.  Ongoing  GOAL:   Patient will meet greater than or equal to 90% of their needs  Met with TF  MONITOR:   Vent status, Labs, I & O's  ASSESSMENT:   67 yo male with PMH of HTN, HLD, Jehovah's witness, DM-2, stroke who was admitted on 2/8 with seizures, HTN emergency, nonketotic hyperglycemia. Required intubation on admission.  Discussed patient in ICU rounds and with RN today. Plans to begin HD today. Tolerating TF well at goal rate. Currently receiving Vital AF 1.2 via OGT at 70 ml/h (1680 ml/day) to provide 2016 kcals, 126 gm protein, 1362 ml free water daily.  Free water flushes 200 ml every 8 hours. Patient is currently intubated on ventilator support MV: 10.5 L/min Temp (24hrs), Avg:99 F (37.2 C), Min:98.3 F (36.8 C), Max:99.7 F (37.6 C)  Labs reviewed. Sodium 148 (H), A1C 11.8 (H) CBG's: [phone] Medications reviewed and include thiamine.  Diet Order:  Diet NPO time specified Seizure precautions  EDUCATION NEEDS:   No education needs have been identified at this time  Skin:  Skin Assessment: Skin Integrity Issues: Skin Integrity Issues:: Other (Comment) Other: laceration to head  Last BM:  2/12  Height:   Ht Readings from Last 1 Encounters:  04/11/17 5' 8"  (1.727 m)    Weight:   Wt Readings from Last 1 Encounters:  04/17/17 196 lb 3.4 oz (89 kg)    Ideal Body Weight:  70 kg  BMI:  Body mass index is 29.83 kg/m.  Estimated Nutritional Needs:   Kcal:  2000  Protein:  100-120 gm  Fluid:  2 L   Molli Barrows, RD, LDN, CNSC Pager [phone] After Hours Pager [phone]

## 2017-04-17 NOTE — Progress Notes (Signed)
HD tx completed @ 2145 w/o problem, UF goal met, blood rinsed back, VSS, report given to Pieter Partridge, RN

## 2017-04-17 NOTE — Progress Notes (Signed)
HD tx initiated via HD cath w/o problem, pull/push/flush equally w/o problem, VSs, will cont to monitor while on HD tx

## 2017-04-17 NOTE — Progress Notes (Signed)
  Echocardiogram 2D Echocardiogram has been performed.  Darlina Sicilian M 04/17/2017, 2:07 PM

## 2017-04-18 ENCOUNTER — Other Ambulatory Visit: Payer: Self-pay

## 2017-04-18 LAB — GLUCOSE, CAPILLARY
GLUCOSE-CAPILLARY: 100 mg/dL — AB (ref 65–99)
Glucose-Capillary: 111 mg/dL — ABNORMAL HIGH (ref 65–99)
Glucose-Capillary: 102 mg/dL — ABNORMAL HIGH (ref 65–99)
Glucose-Capillary: 154 mg/dL — ABNORMAL HIGH (ref 65–99)
Glucose-Capillary: 140 mg/dL — ABNORMAL HIGH (ref 65–99)

## 2017-04-18 LAB — RENAL FUNCTION PANEL
Albumin: 1.8 g/dL — ABNORMAL LOW (ref 3.5–5.0)
Anion gap: 11 (ref 5–15)
BUN: 48 mg/dL — ABNORMAL HIGH (ref 6–20)
CALCIUM: 8.1 mg/dL — AB (ref 8.9–10.3)
CO2: 25 mmol/L (ref 22–32)
CREATININE: 2.68 mg/dL — AB (ref 0.61–1.24)
Chloride: 108 mmol/L (ref 101–111)
GFR, EST AFRICAN AMERICAN: 27 mL/min — AB (ref >60)
GFR, EST NON AFRICAN AMERICAN: 23 mL/min — AB (ref >60)
Glucose, Bld: 109 mg/dL — ABNORMAL HIGH (ref 65–99)
Phosphorus: 3.8 mg/dL (ref 2.5–4.6)
Potassium: 3.8 mmol/L (ref 3.5–5.1)
SODIUM: 144 mmol/L (ref 135–145)

## 2017-04-18 MED ORDER — LABETALOL HCL 5 MG/ML IV SOLN: 5.0000 mg | INTRAVENOUS | Status: DC | PRN

## 2017-04-18 MED ORDER — HYDRALAZINE HCL 20 MG/ML IJ SOLN
10.0000 mg | Freq: Four times a day (QID) | INTRAMUSCULAR | Status: DC | PRN
  Administered 2017-05-03: 20 mg via INTRAVENOUS
  Filled 2017-04-18: qty 1

## 2017-04-18 MED ORDER — LACOSAMIDE 10 MG/ML PO SOLN: 100.0000 mg | Freq: Two times a day (BID) | ORAL | Status: DC

## 2017-04-18 MED ORDER — LEVETIRACETAM 100 MG/ML PO SOLN
500.0000 mg | Freq: Two times a day (BID) | ORAL | Status: DC
Start: 2017-04-18 — End: 2017-05-13
  Administered 2017-04-18 – 2017-05-13 (×51): 500 mg
  Filled 2017-04-18 (×53): qty 5

## 2017-04-18 MED ORDER — LACOSAMIDE 50 MG PO TABS
100.0000 mg | ORAL_TABLET | Freq: Two times a day (BID) | ORAL | Status: DC
  Administered 2017-04-18 – 2017-04-20 (×5): 100 mg
  Filled 2017-04-18 (×5): qty 2

## 2017-04-18 NOTE — Progress Notes (Signed)
[facility] Progress Note    Assessment/ Plan:   1.  Hyperkalemia, resolved - improved with medical interventions as well as HD, now 3.8. No EKG changes noted.   -Lasix 120 mg 3 times daily for potassium clearance -Insulin already ordered in the setting of elevated blood sugars -s/p HD for BUN clearance  2.  AKI/CKD stage 3 - BL creatinine 1.5-1.9 1 year ago. Cr elevated at improved at 2.68 from 3.28, BUN improved at 48 from 60.  Likely in the setting of hypertensive emergency and stroke. - Renal U/S showed no hydronephrosis. Notable for  increased renal parenchymal echogenicity and a L renal cyst 1.3x1.3x1.3cm - s/p HD for improvement in uremia so mental status may be better assessed - Because patient is improving/moving extremities and has good UOP, plan for no HD today and reassess/consider HD tomorrow   3. Resistant hypertension - patient currently permissive hypertension to 528 systolic per neurology recommendations.  He has new acute brain infarct. Permissive hypertension may be somewhat beneficial to kidney function at this point given that rapid reduction in blood pressure may cause further renal injury.  4. Acute CVA/seizures  - neurology on board. R>L new infarcts. Also bil subdural hygromas, no mass effect. - keppra, vimpat hanging  5.  Type 2 diabetes -managed per primary team, insulin-dependent  6. Anemia - presumably from acute illness but may have some underlying anemia of CKD stage 3.  His hgb has fluctuated several times from 5.5 to 9.1.  Continue to monitor  Subjective:   No acute events overnight. Patient tolerated HD x1 yesterday. Remains intubated this morning. He is attempting to pull at the ET tube with his left hand, also noted to move his right UE while the nephrology team is in the room this AM.  Objective:   BP (!) 145/81   Pulse 89   Temp 100.2 F (37.9 C) (Axillary)   Resp 19   Ht 5' 8"  (1.727 m)   Wt 190 lb 14.7 oz (86.6 kg)   SpO2  100%   BMI 29.03 kg/m   Intake/Output Summary (Last 24 hours) at 04/18/2017 1050 Last data filed at 04/18/2017 1000 Gross per 24 hour  Intake 3157.17 ml  Output 5310 ml  Net -2152.83 ml   Weight change: 1 lb 12.2 oz (0.8 kg)  Physical Exam: Gen: NAD, rests in bed, intubated CVS: RRR, no m/r/g Resp:CTA anteriorly, no W/R/R UXL:KGMW, nd/nt Ext: +UE edema in hands, 1-2+ LE edema bil  Imaging: Mr Virgel Paling NU Contrast  Result Date: 04/16/2017 CLINICAL DATA:  67 year old male with right greater than left hemisphere acute watershed type infarcts on brain MRI today performed for right side weakness. EXAM: MRA HEAD WITHOUT CONTRAST TECHNIQUE: Angiographic images of the Circle of Willis were obtained using MRA technique without intravenous contrast. COMPARISON:  Brain MRI 1127 hr today, and earlier. FINDINGS: No intracranial mass effect or ventriculomegaly. Antegrade flow in the posterior circulation with patent distal vertebral arteries. No distal vertebral stenosis. Normal right PICA origin. Fenestrated vertebrobasilar junction (normal variant). No basilar stenosis. AICA, SCA, and PCA origins are normal. Diminutive left posterior communicating artery, the right is smaller or absent. Bilateral PCA branches are normal. Antegrade flow in both ICA siphons. No siphon stenosis. Normal ophthalmic and left posterior communicating artery origins. Patent carotid termini. Normal MCA and ACA origins. The right A1 segment appears mildly dominant. Anterior communicating artery and visible bilateral ACA branches are within normal limits. Both MCA M1 segments and MCA bifurcations are  patent. The visible right MCA branches are within normal limits. On the left, there is mild to moderate irregularity and stenosis at several proximal M2 branches (series 553, image 13). Similar irregularity and stenosis at some left M3 branch origins. However, no left MCA branch occlusion is identified. IMPRESSION: 1. Positive for mild to  moderate irregularity and stenosis of multiple left MCA M2 and M3 branches, but negative for left MCA branch occlusion. 2. Otherwise negative intracranial MRA. Electronically Signed   By: Genevie Ann M.D.   On: 04/16/2017 23:56   Mr Brain Wo Contrast  Result Date: 04/16/2017 CLINICAL DATA:  New right-sided weakness.  Polysubstance abuse. EXAM: MRI HEAD WITHOUT CONTRAST TECHNIQUE: Multiplanar, multiecho pulse sequences of the brain and surrounding structures were obtained without intravenous contrast. COMPARISON:  CT head 04/13/2017, MRI 04/11/2017 FINDINGS: Brain: Compared with the recent MRI, there are new areas of infarction bilaterally, left greater than right. These appear to be within the watershed territory bilaterally in the deep white matter structures. This corresponds a new area of low-density in the deep white matter on the left on recent CT. No associated hemorrhage or mass. Generalized atrophy. 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 These show interval improvement from 04/11/2017. 3 mm midline shift to the right. Negative for hydrocephalus. Negative for mass lesion. Vascular: Normal arterial flow voids Skull and upper cervical spine: Negative Sinuses/Orbits: The patient is intubated. Mucosal edema throughout the paranasal sinuses and mastoid sinus bilaterally. Normal orbit Other: None IMPRESSION: New areas of acute infarct bilaterally, left greater than right. These appear to be watershed infarcts bilaterally. Negative for hemorrhage. Electronically Signed   By: Franchot Gallo M.D.   On: 04/16/2017 13:15   US Renal  Result Date: 04/16/2017 CLINICAL DATA:  Acute renal failure EXAM: RENAL / URINARY TRACT ULTRASOUND COMPLETE COMPARISON:  None. FINDINGS: Right Kidney: Length: 10.5 cm. Increased renal parenchymal echogenicity. No hydronephrosis. Left Kidney: Length: 12.2 cm. Increased renal parenchymal echogenicity. No hydronephrosis. Left renal cyst measures 1.3 x 1.3 x 1.3 cm.  Bladder: Appears normal for degree of bladder distention. There is a Foley catheter present. Other: Small pleural effusions. IMPRESSION: 1. No hydronephrosis. Increased renal parenchymal echogenicity is consistent with chronic medical renal disease. 2. Incidentally visualized small pleural effusions. Electronically Signed   By: Ulyses Jarred M.D.   On: 04/16/2017 21:38   Dg Chest Port 1 View  Result Date: 04/16/2017 CLINICAL DATA:  Central line placement ETT present Hx of HTN, DM2, stroke EXAM: PORTABLE CHEST 1 VIEW COMPARISON:  04/15/2017 FINDINGS: Interval placement of a RIGHT central venous line tip in distal SVC. No pneumothorax. Endotracheal tube good position. Introduction of a NG tube which extends the stomach. Mild RIGHT basilar atelectasis which is new.  No pulmonary edema. IMPRESSION: 1. Introduction of RIGHT central venous line without pneumothorax. 2. Endotracheal tube in good position.  NG tube in stomach. 3. Increasing RIGHT basilar atelectasis/edema. Electronically Signed   By: Suzy Bouchard M.D.   On: 04/16/2017 16:09    Labs: BMET Recent Labs  Lab 04/11/17 2153  04/13/17 0556 04/14/17 0109 04/15/17 0304 04/16/17 0357 04/16/17 1811 04/17/17 0400 04/17/17 1819 04/18/17 0329  NA 145   < > 144 148* 146* 146* 148* 148* 147* 144  K 3.2*   < > 4.2 4.5 5.7* 6.2* 4.5 4.4 4.0 3.8  CL 110   < > 112* 115* 115* 116* 116* 114* 112* 108  CO2 22   < > 20* 22 20* 19* 20* 22  23 25  GLUCOSE 121*   < > 135* 152* 281* 239* 128* 86 93 109*  BUN 22*   < > 29* 31* 41* 53* 58* 60* 64* 48*  CREATININE 3.10*   < > 3.04* 2.95* 3.19* 3.26* 3.31* 3.28* 3.34* 2.68*  CALCIUM 8.1*   < > 7.8* 8.2* 8.1* 8.2* 7.9* 8.1* 8.2* 8.1*  PHOS 2.0*  --  3.0 3.0  --   --  3.1 3.8 4.4 3.8   < > = values in this interval not displayed.   CBC Recent Labs  Lab 04/14/17 0608 04/15/17 0304 04/16/17 0357 04/17/17 1819  WBC 8.3 7.7 9.1 8.5  NEUTROABS 5.9  --   --   --   HGB 9.3* 9.3* 9.1* 8.0*  HCT 29.6*  30.0* 29.7* 25.7*  MCV 84.6 85.7 85.3 84.5  PLT 154 194 189 195    Medications:    . amLODipine  10 mg Per Tube Daily  . aspirin  325 mg Per Tube Daily  . atorvastatin  40 mg Per Tube q1800  . chlorhexidine gluconate (MEDLINE KIT)  15 mL Mouth Rinse BID  . Chlorhexidine Gluconate Cloth  6 each Topical Daily  . cloNIDine  0.2 mg Per Tube BID  . free water  200 mL Per Tube Q8H  . heparin  5,000 Units Subcutaneous Q8H  . insulin aspart  0-20 Units Subcutaneous Q4H  . insulin aspart  4 Units Subcutaneous Q4H  . insulin glargine  25 Units Subcutaneous Daily  . labetalol  200 mg Per Tube Q8H  . lacosamide  100 mg Per Tube BID  . levETIRAcetam  500 mg Per Tube BID  . mouth rinse  15 mL Mouth Rinse Q4H  . pantoprazole sodium  40 mg Per Tube Daily  . thiamine  100 mg Per Tube Daily    Everrett Coombe, MD PGY2 04/18/2017, 10:50 AM   I have seen and examined this patient and agree with plan and assessment in the above note with renal recommendations/intervention highlighted.  Pt's mental status has improved following one HD session.  Will hold off on more HD today and hopefully his mentation, UOP, and renal function will continue to improve.  If not will plan for another session of HD tomorrow.   Broadus John A Moody Robben,MD 04/18/2017 2:45 PM

## 2017-04-18 NOTE — Progress Notes (Signed)
STROKE TEAM PROGRESS NOTE   SUBJECTIVE (INTERVAL HISTORY) No family is at the bedside.  Overall he feels his condition is unchanged. He still intubated, obtunded, not following commands or tracking. No seizure activity. BP 120-140s, on multiple heavy BP meds. Still has CKD and Cre improved some after HD yesterday. Off cardene   OBJECTIVE Temp:  [98 F (36.7 C)-100.2 F (37.9 C)] 99.1 F (37.3 C) (02/15 1328) Pulse Rate:  [80-97] 83 (02/15 1408) Cardiac Rhythm: Normal sinus rhythm (02/15 0749) Resp:  [10-24] 20 (02/15 1200) BP: (120-167)/(66-88) 142/77 (02/15 1408) SpO2:  [99 %-100 %] 100 % (02/15 1200) FiO2 (%):  [40 %] 40 % (02/15 1120) Weight:  [190 lb 14.7 oz (86.6 kg)-197 lb 15.6 oz (89.8 kg)] 190 lb 14.7 oz (86.6 kg) (02/15 0351)  Recent Labs  Lab 04/17/17 2103 04/17/17 2339 04/18/17 0418 04/18/17 0809 04/18/17 1325  GLUCAP 97 87 111* 100* 102*   Recent Labs  Lab 04/11/17 2153  04/13/17 0556 04/14/17 0608 04/15/17 0304 04/16/17 0357 04/16/17 1811 04/17/17 0400 04/17/17 1819 04/18/17 0329  NA 145   < > 144 148* 146* 146* 148* 148* 147* 144  K 3.2*   < > 4.2 4.5 5.7* 6.2* 4.5 4.4 4.0 3.8  CL 110   < > 112* 115* 115* 116* 116* 114* 112* 108  CO2 22   < > 20* 22 20* 19* 20* 22 23 25   GLUCOSE 121*   < > 135* 152* 281* 239* 128* 86 93 109*  BUN 22*   < > 29* 31* 41* 53* 58* 60* 64* 48*  CREATININE 3.10*   < > 3.04* 2.95* 3.19* 3.26* 3.31* 3.28* 3.34* 2.68*  CALCIUM 8.1*   < > 7.8* 8.2* 8.1* 8.2* 7.9* 8.1* 8.2* 8.1*  MG 1.8  --  1.7 1.9 1.8  --   --   --   --   --   PHOS 2.0*  --  3.0 3.0  --   --  3.1 3.8 4.4 3.8   < > = values in this interval not displayed.   Recent Labs  Lab 04/16/17 1811 04/17/17 0400 04/17/17 1819 04/17/17 1838 04/18/17 0329  ALT  --   --   --  21  --   ALBUMIN 1.8* 1.8* 1.9*  --  1.8*   Recent Labs  Lab 04/14/17 0608 04/15/17 0304 04/16/17 0357 04/17/17 1819  WBC 8.3 7.7 9.1 8.5  NEUTROABS 5.9  --   --   --   HGB 9.3* 9.3*  9.1* 8.0*  HCT 29.6* 30.0* 29.7* 25.7*  MCV 84.6 85.7 85.3 84.5  PLT 154 194 189 195   No results for input(s): CKTOTAL, CKMB, CKMBINDEX, TROPONINI in the last 168 hours. No results for input(s): LABPROT, INR in the last 72 hours. No results for input(s): COLORURINE, LABSPEC, [place], GLUCOSEU, HGBUR, BILIRUBINUR, KETONESUR, PROTEINUR, UROBILINOGEN, NITRITE, LEUKOCYTESUR in the last 72 hours.  Invalid input(s): APPERANCEUR     Component Value Date/Time   CHOL 172 04/17/2017 0830   TRIG 98 04/17/2017 0830   HDL 41 04/17/2017 0830   CHOLHDL 4.2 04/17/2017 0830   VLDL 20 04/17/2017 0830   LDLCALC 111 (H) 04/17/2017 0830   Lab Results  Component Value Date   HGBA1C 11.8 (H) 04/17/2017      Component Value Date/Time   LABOPIA NONE DETECTED 04/11/2017 0447   COCAINSCRNUR POSITIVE (A) 04/11/2017 0447   LABBENZ POSITIVE (A) 04/11/2017 0447   AMPHETMU NONE DETECTED 04/11/2017 0447   THCU  NONE DETECTED 04/11/2017 0447   LABBARB NONE DETECTED 04/11/2017 0447    No results for input(s): ETH in the last 168 hours.  I have personally reviewed the radiological images below and agree with the radiology interpretations.  Ct Head Wo Contrast  Result Date: 04/13/2017 CLINICAL DATA:  Focal neuro deficit of less than 6 hours. Stroke suspected. Abnormal left arm movements. Seizures in the setting of polysubstance abuse. EXAM: CT HEAD WITHOUT CONTRAST TECHNIQUE: Contiguous axial images were obtained from the base of the skull through the vertex without intravenous contrast. COMPARISON:  MRI brain and CT head 04/11/2017. 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 The extra-axial collection on the right is slightly more prominent than on the previous studies. It now measures 5.8 mm on coronal images compared with 4.9 mm on the CT scan and less than 3 mm on the CT scan from 2 days ago. There is no midline shift. Moderate white matter disease is again noted bilaterally. There is  progressive white matter hypoattenuation in the posterior left frontal lobe. No definite cortical abnormality is present. Vascular: Vascular calcifications are present within the cavernous internal carotid arteries. There is no hyperdense vessel. Skull: The calvarium is intact. No focal lytic or blastic lesions are present. Sinuses/Orbits: A remote left orbital blowout fracture is present. A single pacified left ethmoid air cell is present. The paranasal sinuses and mastoid air cells are otherwise clear. The globes and orbits are within normal limits. IMPRESSION: 1. Progressive white matter hypoattenuation in the posterior left frontal lobe is concerning for infarct. Infection is considered less likely. There may be some cortical involvement is well. The changes are mostly subcortical. 2. Stable white matter disease on the right. 3. Bilateral subdural hygromas with increased since the previous CT scan. The above was relayed via text pager to Dr. Roland Rack on 04/13/2017 at 16:55 . Electronically Signed   By: San Morelle M.D.   On: 04/13/2017 16:55   Ct Head Wo Contrast  Result Date: 04/11/2017 CLINICAL DATA:  Right-sided weakness and posturing EXAM: CT HEAD WITHOUT CONTRAST TECHNIQUE: Contiguous axial images were obtained from the base of the skull through the vertex without intravenous contrast. COMPARISON:  04/11/2017 FINDINGS: Brain: No evidence of acute infarction, hemorrhage, hydrocephalus, extra-axial collection or mass lesion/mass effect. Diffuse atrophic changes are again noted. Vascular: No hyperdense vessel or unexpected calcification. Skull: Normal. Negative for fracture or focal lesion. Sinuses/Orbits: Sinuses are well aerated. Small mucosal retention cyst is noted within the both the right maxillary antrum and left ethmoid sinuses. No air-fluid level is noted. Other: None IMPRESSION: Chronic atrophic changes without acute abnormality. No significant interval change from the previous  exam is noted. Electronically Signed   By: Inez Catalina M.D.   On: 04/11/2017 07:41   Ct Head Wo Contrast  Result Date: 04/11/2017 CLINICAL DATA:  Altered mental status with seizure EXAM: CT HEAD WITHOUT CONTRAST CT CERVICAL SPINE WITHOUT CONTRAST TECHNIQUE: Multidetector CT imaging of the head and cervical spine was performed following the standard protocol without intravenous contrast. Multiplanar CT image reconstructions of the cervical spine were also generated. COMPARISON:  CT head and C-spine 07/21/2016 FINDINGS: CT HEAD FINDINGS Brain: No acute territorial infarction, hemorrhage, or intracranial mass is seen. Hypodensity within the periventricular white matter consistent with small-vessel ischemic changes. Stable ventricle size. Slight prominence of extra-axial CSF density at the convexities, could be due to atrophy or tiny chronic subdural effusions. Vascular: No hyperdense vessels. Scattered calcifications at the carotid siphons. Skull: Normal.  Negative for fracture or focal lesion. Sinuses/Orbits: Old fracture medial wall left orbit. Mild sinus mucosal disease in the ethmoid sinuses. Small mucous retention cyst right maxillary sinus. No acute orbital abnormality. Other: None CT CERVICAL SPINE FINDINGS Alignment: Straightening of the cervical spine. No subluxation. Facet alignment within normal limits. Skull base and vertebrae: No acute fracture. No primary bone lesion or focal pathologic process. Soft tissues and spinal canal: No prevertebral fluid or swelling. No visible canal hematoma. Disc levels:  Moderate degenerative changes at C5-C6 and C6-C7. Upper chest: Negative. Other: None IMPRESSION: 1. No CT evidence for acute intracranial abnormality. Atrophy and small vessel ischemic changes of the white matter. 2. Straightening of the cervical spine. No definite acute osseous abnormality. Electronically Signed   By: Donavan Foil M.D.   On: 04/11/2017 02:29   Ct Cervical Spine Wo Contrast  Result  Date: 04/11/2017 CLINICAL DATA:  Altered mental status with seizure EXAM: CT HEAD WITHOUT CONTRAST CT CERVICAL SPINE WITHOUT CONTRAST TECHNIQUE: Multidetector CT imaging of the head and cervical spine was performed following the standard protocol without intravenous contrast. Multiplanar CT image reconstructions of the cervical spine were also generated. COMPARISON:  CT head and C-spine 07/21/2016 FINDINGS: CT HEAD FINDINGS Brain: No acute territorial infarction, hemorrhage, or intracranial mass is seen. Hypodensity within the periventricular white matter consistent with small-vessel ischemic changes. Stable ventricle size. Slight prominence of extra-axial CSF density at the convexities, could be due to atrophy or tiny chronic subdural effusions. Vascular: No hyperdense vessels. Scattered calcifications at the carotid siphons. Skull: Normal. Negative for fracture or focal lesion. Sinuses/Orbits: Old fracture medial wall left orbit. Mild sinus mucosal disease in the ethmoid sinuses. Small mucous retention cyst right maxillary sinus. No acute orbital abnormality. Other: None CT CERVICAL SPINE FINDINGS Alignment: Straightening of the cervical spine. No subluxation. Facet alignment within normal limits. Skull base and vertebrae: No acute fracture. No primary bone lesion or focal pathologic process. Soft tissues and spinal canal: No prevertebral fluid or swelling. No visible canal hematoma. Disc levels:  Moderate degenerative changes at C5-C6 and C6-C7. Upper chest: Negative. Other: None IMPRESSION: 1. No CT evidence for acute intracranial abnormality. Atrophy and small vessel ischemic changes of the white matter. 2. Straightening of the cervical spine. No definite acute osseous abnormality. Electronically Signed   By: Donavan Foil M.D.   On: 04/11/2017 02:29   Mr Jodene Nam Head Wo Contrast  Result Date: 04/16/2017 CLINICAL DATA:  67 year old male with right greater than left hemisphere acute watershed type infarcts on  brain MRI today performed for right side weakness. EXAM: MRA HEAD WITHOUT CONTRAST TECHNIQUE: Angiographic images of the Circle of Willis were obtained using MRA technique without intravenous contrast. COMPARISON:  Brain MRI 1127 hr today, and earlier. FINDINGS: No intracranial mass effect or ventriculomegaly. Antegrade flow in the posterior circulation with patent distal vertebral arteries. No distal vertebral stenosis. Normal right PICA origin. Fenestrated vertebrobasilar junction (normal variant). No basilar stenosis. AICA, SCA, and PCA origins are normal. Diminutive left posterior communicating artery, the right is smaller or absent. Bilateral PCA branches are normal. Antegrade flow in both ICA siphons. No siphon stenosis. Normal ophthalmic and left posterior communicating artery origins. Patent carotid termini. Normal MCA and ACA origins. The right A1 segment appears mildly dominant. Anterior communicating artery and visible bilateral ACA branches are within normal limits. Both MCA M1 segments and MCA bifurcations are patent. The visible right MCA branches are within normal limits. On the left, there is mild to moderate irregularity and  stenosis at several proximal M2 branches (series 553, image 13). Similar irregularity and stenosis at some left M3 branch origins. However, no left MCA branch occlusion is identified. IMPRESSION: 1. Positive for mild to moderate irregularity and stenosis of multiple left MCA M2 and M3 branches, but negative for left MCA branch occlusion. 2. Otherwise negative intracranial MRA. Electronically Signed   By: Genevie Ann M.D.   On: 04/16/2017 23:56   Mr Brain Wo Contrast  Result Date: 04/16/2017 CLINICAL DATA:  New right-sided weakness.  Polysubstance abuse. EXAM: MRI HEAD WITHOUT CONTRAST TECHNIQUE: Multiplanar, multiecho pulse sequences of the brain and surrounding structures were obtained without intravenous contrast. COMPARISON:  CT head 04/13/2017, MRI 04/11/2017 FINDINGS:  Brain: Compared with the recent MRI, there are new areas of infarction bilaterally, left greater than right. These appear to be within the watershed territory bilaterally in the deep white matter structures. This corresponds a new area of low-density in the deep white matter on the left on recent CT. No associated hemorrhage or mass. Generalized atrophy. 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 These show interval improvement from 04/11/2017. 3 mm midline shift to the right. Negative for hydrocephalus. Negative for mass lesion. Vascular: Normal arterial flow voids Skull and upper cervical spine: Negative Sinuses/Orbits: The patient is intubated. Mucosal edema throughout the paranasal sinuses and mastoid sinus bilaterally. Normal orbit Other: None IMPRESSION: New areas of acute infarct bilaterally, left greater than right. These appear to be watershed infarcts bilaterally. Negative for hemorrhage. Electronically Signed   By: Franchot Gallo M.D.   On: 04/16/2017 13:15   Mr Brain Wo Contrast  Result Date: 04/11/2017 CLINICAL DATA:  67 year old male with unexplained altered mental status and seizures. Hyperglycemia. EXAM: MRI HEAD WITHOUT CONTRAST TECHNIQUE: Multiplanar, multiecho pulse sequences of the brain and surrounding structures were obtained without intravenous contrast. COMPARISON:  Head CT without contrast 0707 hr today and earlier. Brain MRI 07/22/2016 FINDINGS: Brain: Bilateral CSF isointense subdural fluid collections are new since May 2018, measuring about 5 millimeters in thickness bilaterally (series 13, image 17, series 16, image 16). No midline shift. Minimal associated hemisphere mass effect. No acute intracranial hemorrhage identified. No restricted diffusion to suggest acute infarction. No midline shift, mass effect, evidence of mass lesion, or ventriculomegaly. Cervicomedullary junction and pituitary are within normal limits. Patchy and confluent bilateral cerebral white  matter T2 and FLAIR hyperintensity is stable since 2018. No cortical encephalomalacia or chronic cerebral blood products identified. Deep gray matter nuclei are stable and within normal limits. Brainstem and cerebellum appear negative. No new signal abnormality identified. Vascular: Major intracranial vascular flow voids are stable. Skull and upper cervical spine: Negative visible cervical spine. Normal bone marrow signal. Sinuses/Orbits: Stable and negative. Other: Visible internal auditory structures appear normal. Mastoids are clear. There is a small volume of fluid layering in the nasopharynx. Scalp and face soft tissues appear negative. IMPRESSION: 1. Small, symmetric bilateral subdural CSF hygromas are new since the 2018 MRI, measuring 5 mm each. No midline shift and minimal associated intracranial mass effect. 2. No acute intracranial hemorrhage. No other acute intracranial abnormality. 3. Chronic nonspecific cerebral white matter signal changes. Electronically Signed   By: Genevie Ann M.D.   On: 04/11/2017 09:57   US Renal  Result Date: 04/16/2017 CLINICAL DATA:  Acute renal failure EXAM: RENAL / URINARY TRACT ULTRASOUND COMPLETE COMPARISON:  None. FINDINGS: Right Kidney: Length: 10.5 cm. Increased renal parenchymal echogenicity. No hydronephrosis. Left Kidney: Length: 12.2 cm. Increased renal parenchymal echogenicity. No  hydronephrosis. Left renal cyst measures 1.3 x 1.3 x 1.3 cm. Bladder: Appears normal for degree of bladder distention. There is a Foley catheter present. Other: Small pleural effusions. IMPRESSION: 1. No hydronephrosis. Increased renal parenchymal echogenicity is consistent with chronic medical renal disease. 2. Incidentally visualized small pleural effusions. Electronically Signed   By: Ulyses Jarred M.D.   On: 04/16/2017 21:38   Dg Chest Port 1 View  Result Date: 04/16/2017 CLINICAL DATA:  Central line placement ETT present Hx of HTN, DM2, stroke EXAM: PORTABLE CHEST 1 VIEW  COMPARISON:  04/15/2017 FINDINGS: Interval placement of a RIGHT central venous line tip in distal SVC. No pneumothorax. Endotracheal tube good position. Introduction of a NG tube which extends the stomach. Mild RIGHT basilar atelectasis which is new.  No pulmonary edema. IMPRESSION: 1. Introduction of RIGHT central venous line without pneumothorax. 2. Endotracheal tube in good position.  NG tube in stomach. 3. Increasing RIGHT basilar atelectasis/edema. Electronically Signed   By: Suzy Bouchard M.D.   On: 04/16/2017 16:09   Dg Chest Port 1 View  Result Date: 04/15/2017 CLINICAL DATA:  67 year old male with history of acute respiratory failure. Shortness of breath. EXAM: PORTABLE CHEST 1 VIEW COMPARISON:  Chest x-ray 04/14/2017. FINDINGS: An endotracheal tube is in place with tip 5.1 cm above the carina. Lung volumes are normal. No consolidative airspace disease. No pleural effusions. No pneumothorax. No pulmonary nodule or mass noted. Pulmonary vasculature and the cardiomediastinal silhouette are within normal limits. Atherosclerosis in the thoracic aorta. Small metallic density again projects over the mid right hemithorax. IMPRESSION: 1. Tip of endotracheal tube is 5.1 cm above the carina. 2. No radiographic evidence of acute cardiopulmonary disease. 3. Aortic atherosclerosis. Electronically Signed   By: Vinnie Langton M.D.   On: 04/15/2017 07:47   Dg Chest Port 1 View  Result Date: 04/14/2017 CLINICAL DATA:  Respiratory failure EXAM: PORTABLE CHEST 1 VIEW COMPARISON:  Yesterday FINDINGS: Endotracheal tube tip between the clavicular heads and carina. An orogastric tube reaches the stomach which still contains gas. There is no edema, consolidation, effusion, or pneumothorax. Mild streaky opacity behind the heart. Mild cardiomegaly. Carotid metallic foreign body over the right chest. IMPRESSION: 1. Mild streaky retrocardiac opacity favoring atelectasis. 2. Endotracheal and orogastric tubes in good  position. 3. Continued gaseous distention of the stomach. Electronically Signed   By: Monte Fantasia M.D.   On: 04/14/2017 06:49   Dg Chest Port 1 View  Result Date: 04/13/2017 CLINICAL DATA:  Respiratory failure EXAM: PORTABLE CHEST 1 VIEW COMPARISON:  04/12/2017 and prior exams FINDINGS: An endotracheal tube with tip 5.3 cm above the carina, and NG tube entering the stomach again noted. Cardiomegaly identified. There is no evidence of focal airspace disease, pulmonary edema, effusion, or pneumothorax. No acute bony abnormalities are identified. IMPRESSION: Cardiomegaly without evidence of acute cardiopulmonary disease. Electronically Signed   By: Margarette Canada M.D.   On: 04/13/2017 07:53   Portable Chest Xray  Result Date: 04/12/2017 CLINICAL DATA:  Endotracheal tube present EXAM: PORTABLE CHEST 1 VIEW COMPARISON:  Yesterday FINDINGS: Endotracheal tube tip just below the clavicular heads. An orogastric tube reaches the stomach with side port at the GE junction. Improved aeration. No visible effusion or pneumothorax. Borderline heart size. Artifact from EKG leads. IMPRESSION: 1. Unremarkable positioning of endotracheal and orogastric tubes. 2. Improved aeration. Electronically Signed   By: Monte Fantasia M.D.   On: 04/12/2017 07:06   Portable Chest X-ray  Result Date: 04/11/2017 CLINICAL DATA:  Endotracheal tube  placement. EXAM: PORTABLE CHEST 1 VIEW COMPARISON:  Chest radiograph performed earlier today at 1:41 a.m. FINDINGS: The patient's endotracheal tube is seen ending 3-4 cm above the carina. An enteric tube is noted extending below the diaphragm. Vascular congestion is noted. Right perihilar airspace opacity may reflect asymmetric interstitial edema, new from the prior study. No pleural effusion or pneumothorax is seen. The cardiomediastinal silhouette is normal in size. No acute osseous abnormalities are identified. IMPRESSION: 1. Endotracheal tube seen ending 3-4 cm above the carina. 2. Vascular  congestion. Right perihilar airspace opacity is new from the prior study and may reflect asymmetric interstitial edema. Electronically Signed   By: Garald Balding M.D.   On: 04/11/2017 06:07   Dg Chest Portable 1 View  Result Date: 04/11/2017 CLINICAL DATA:  Status post seizure. EXAM: PORTABLE CHEST 1 VIEW COMPARISON:  Chest radiograph performed 07/24/2016 FINDINGS: The lungs are well-aerated and clear. There is no evidence of focal opacification, pleural effusion or pneumothorax. The cardiomediastinal silhouette is borderline normal in size. No acute osseous abnormalities are seen. IMPRESSION: No acute cardiopulmonary process seen. Electronically Signed   By: Garald Balding M.D.   On: 04/11/2017 02:07   Dg Abd Portable 1v  Result Date: 04/15/2017 CLINICAL DATA:  Status post OG tube placement. EXAM: PORTABLE ABDOMEN - 1 VIEW COMPARISON:  None. FINDINGS: OG tube is looped in the fundus of the stomach. Both the tip and side-port are in the stomach. Bowel gas pattern is non obstructive. Prominent stool ascending colon noted. IMPRESSION: OG tube in good position.  No acute finding. Electronically Signed   By: Inge Rise M.D.   On: 04/15/2017 13:48   Vas US Carotid  Result Date: 04/15/2017 Carotid Arterial Duplex Study Indications:   CVA and Follow up left common carotid artery stenosis. Risk Factors:  Hypertension, hyperlipidemia. Other Factors: Type II diabetes mellitus. Examination Guidelines: A complete evaluation includes B-mode imaging, spectral doppler, color doppler, and power doppler as needed of all accessible portions of each vessel. Bilateral testing is considered an integral part of a complete examination. Limited examinations for reoccurring indications may be performed as noted.  Right Carotid Findings: +----------+--------+--------+--------+------------------+--------+           PSV cm/sEDV cm/sStenosisDescribe          Comments  +----------+--------+--------+--------+------------------+--------+ CCA Prox  -217    -26             diffuse and smooth         +----------+--------+--------+--------+------------------+--------+ CCA Mid                           diffuse and smooth         +----------+--------+--------+--------+------------------+--------+ CCA Distal-218    -27                                        +----------+--------+--------+--------+------------------+--------+ ICA Prox  -177    -29             heterogenous               +----------+--------+--------+--------+------------------+--------+ ICA Distal-128    -36                                        +----------+--------+--------+--------+------------------+--------+ ECA       -  255    -22             diffuse and smooth         +----------+--------+--------+--------+------------------+--------+ +----------+--------+-------+----------------+-------------------+           PSV cm/sEDV cmsDescribe        Arm Pressure (mmHG) +----------+--------+-------+----------------+-------------------+ Subclavian260            Multiphasic, WNL                    +----------+--------+-------+----------------+-------------------+ +---------+--------+--+--------+--+---------+ VertebralPSV cm/s89EDV cm/s17Antegrade +---------+--------+--+--------+--+---------+  Left Carotid Findings: +----------+--------+--------+--------+--------------------------+--------+           PSV cm/sEDV cm/sStenosisDescribe                  Comments +----------+--------+--------+--------+--------------------------+--------+ CCA Prox  374     64      >50%    diffuse and smooth                 +----------+--------+--------+--------+--------------------------+--------+ CCA Distal479     66      >50%    diffuse and smooth                 +----------+--------+--------+--------+--------------------------+--------+ ICA Prox  -213    -30              heterogenous and irregular         +----------+--------+--------+--------+--------------------------+--------+ ICA Distal-99     -18                                                +----------+--------+--------+--------+--------------------------+--------+ ECA       -393    -31             heterogenous                       +----------+--------+--------+--------+--------------------------+--------+ +----------+--------+--------+--------+-------------------+ SubclavianPSV cm/sEDV cm/sDescribeArm Pressure (mmHG) +----------+--------+--------+--------+-------------------+           451             Stenotic                    +----------+--------+--------+--------+-------------------+ +---------+--------+--+--------+--+-----------------+ VertebralPSV cm/s29EDV cm/s12Atypical waveform +---------+--------+--+--------+--+-----------------+  Final Interpretation: Right Carotid: Elevated common carotid artery velocities, along with plaque                morphology suggest hemodynamically significant stenosis versus                proximal obstruction. Elevated right internal carotid artery                velocities suggest low range 40-59% stenosis, however this is                likely due to proximal obstruction given lack of significant                plaque morphology. Left Carotid: Elevated common carotid artery velocities, along with significant               plaque morphology is suggestive of >50% stenosis. This is               consistent with previous findings by CTA and ultrasound. Elevated               left internal carotid artery velocities suggest low range 60-79%  stenosis, however this may be due to proximal obstruction given               lack of significant plaque morphology. Vertebrals:  Right vertebral artery was patent with antegrade flow. Left              vertebral artery exhibits atypical waveform, suggestive of possible              proximal  obstruction. Subclavians: Left subclavian artery was moderately stenotic. Normal flow              hemodynamics were seen in the right subclavian artery. Correlate              with CT angio if clinically necessary *See table(s) above for measurements and observations.  Electronically signed by Antony Contras on 04/15/2017 at 8:11:18 AM.   Carotid Doppler  Right Carotid: Elevated common carotid artery velocities, along with plaque        morphology suggest hemodynamically significant stenosis versus        proximal obstruction. Elevated right internal carotid artery        velocities suggest low range 40-59% stenosis, however this is        likely due to proximal obstruction given lack of significant        plaque morphology.  Left Carotid: Elevated common carotid artery velocities, along with significant plaque morphology is suggestive of >50% stenosis. This is       consistent with previous findings by CTA and ultrasound. Elevated       left internal carotid artery velocities suggest low range 60-79%       stenosis, however this may be due to proximal obstruction givenlack of significant plaque morphology.  Vertebrals: Right vertebral artery was patent with antegrade flow. Left       vertebral artery exhibits atypical waveform, suggestive of possible       proximal obstruction. Subclavians: Left subclavian artery was moderately stenotic. Normal flow       hemodynamics were seen in the right subclavian artery. Correlate       with CT angio if clinically necessary  TTE - Left ventricle: The cavity size was normal. Wall thickness was   increased in a pattern of moderate LVH. There was severe   concentric hypertrophy. Systolic function was normal. The   estimated ejection fraction was in the range of 60% to 65%. Wall   motion was normal; there were no regional wall motion   abnormalities. Doppler  parameters are consistent with abnormal   left ventricular relaxation (grade 1 diastolic dysfunction). The   E/e&' ratio is between 8-15, suggesting indeterminate LV filling   pressure. - Aortic valve: Trileaflet. There was no stenosis. There was no   regurgitation. - Mitral valve: Mildly thickened leaflets . There was mild   regurgitation. - Left atrium: The atrium was normal in size. - Inferior vena cava: The vessel was dilated. The respirophasic   diameter changes were blunted (< 50%), consistent with elevated   central venous pressure. On ventilator.   PHYSICAL EXAM  Temp:  [98 F (36.7 C)-100.2 F (37.9 C)] 99.1 F (37.3 C) (02/15 1328) Pulse Rate:  [80-97] 83 (02/15 1408) Resp:  [10-24] 20 (02/15 1200) BP: (120-167)/(66-88) 142/77 (02/15 1408) SpO2:  [99 %-100 %] 100 % (02/15 1200) FiO2 (%):  [40 %] 40 % (02/15 1120) Weight:  [190 lb 14.7 oz (86.6 kg)-197 lb 15.6 oz (89.8 kg)] 190 lb 14.7 oz (86.6 kg) (02/15  39)  General - Well nourished, well developed, intubated  Ophthalmologic - fundi not visualized due to noncooperation.  Cardiovascular - Regular rate and rhythm  Neuro - obtunded, eyes open but not tracking objects. Intermittent blinking to visual threat on the left, but not on the right. Intubated, not following commands.  PERRL, positive doll's eyes, positive corneal and gag reflexes.  Facial symmetry not able to test due to ET tube.  Tongue midline inside mouth.  On pain stimulation, LUE withdraw and localizing to pain, RUE mild withdrawal not able to against gravity, mild withdraw at bilateral lower extremities, L>R.  DTR 1+, no Babinski. Sensation, coordination and gait not tested.   ASSESSMENT/PLAN Mr. Richard Bennett is a 67 y.o. male with history of hypertension, hyperlipidemia, diabetes and bilateral carotid stenosis admitted for seizure. No tPA given due to out of window.    Stroke:  bilateral periventricular and watershed area infarcts, L>R, likely  secondary to bilateral carotid stenosis in the setting of episodic hypotension with hypertensive emergency  Resultant obtunded, intubated  MRI  L>R multifocal periventricular and watershed area infarcts bilaterally  MRA mild to moderate left M2 M3 branches stenosis  Carotid Doppler right ICA 40-59%, left CCA > 50%, left ICA 60-79% stenosis  2D Echo EF 60-65%  LDL 111  HgbA1c 11.8  Heparin subq for VTE prophylaxis Diet NPO time specified  Seizure precautions   aspirin 325 mg daily prior to admission, now on aspirin 325 mg daily.   Ongoing aggressive stroke risk factor management  Therapy recommendations: Pending  Disposition: Pending  Seizure  Admitted on 07/22/16 for altered mental status and seizure, found to have a severe hyperglycemia and severe hypertensive emergency.  CT negative, EEG negative for seizure.    This time again admitted for seizure and altered mental status, found to have severe hyperglycemia and severe hypertension.  Again EEG negative, however found to have a simple partial seizure  Currently on Keppra and Vimpat  Bilateral carotid stenosis  In 07/2016 MRI showed left watershed, left MCA/ACA infarcts.  Carotid Doppler left CCA >50%, left ICA 64-33% systolic and 29-51% diastolic.  08/2016 carotid Doppler showed left CCA thrombus  09/2016 CTA showed left CCA smooth long segment plaque with 70% stenosis, left subclavian artery 60-70% stenosis left VA severe stenosis  Follow with the [facility] as outpatient, recommended medical treatment  On aspirin and Lipitor at home  This admission carotid Doppler again left CCA > 50%, left ICA 60-79%, and right ICA 40-59% stenosis  Cocaine abuse  UDS positive for cocaine this time  Cocaine cessation education will be provided  Respiratory distress  Intubated  CCM on board  Diabetes  HgbA1c 11.8 goal < 7.0  Uncontrolled  Currently on Lantus  CBG monitoring  SSI  DM education and close PCP follow  up  Hypertension Unstable On multiple p.o. and IV BP medications including amlodipine 10 mg, clonidine 0.2 mg twice daily, Lasix 120 every 6 hours, labetalol 200 mg every 8 hours as well as Cardene drip  BP goal 140-160 due to watershed infarcts with bilateral carotid stenosis  Hyperlipidemia  Home meds: Lipitor 80  LDL 111, goal < 70  Now on Lipitor 40  Continue statin at discharge  AKI on CKD  Elevated creatinine  On HD   CCM following  Other Stroke Risk Factors  Advanced age  Former cigarette smoker, quit 39 years ago  ETOH use  Polysubstance abuse including heroine and cocaine  Other Active Problems  Anemia of chronic disease  Follows with Dr. Leta Baptist at [facility] day # 7  This patient is critically ill due to seizure, uncontrolled diabetes, seizure, uncontrolled hypertension, carotid stenosis bilaterally, watershed strokes and at significant risk of neurological worsening, death form recurrent strokes, hemorrhagic conversion, DKA, heart failure, status epilepticus. This patient's care requires constant monitoring of vital signs, hemodynamics, respiratory and cardiac monitoring, review of multiple databases, neurological assessment, discussion with family, other specialists and medical decision making of high complexity.  I also discussed with Dr. Lamonte Sakai. I spent 35 minutes of neurocritical care time in the care of this patient.  Neurology will sign off. Please call with questions. Pt will follow up with Dr. Leta Baptist at [facility] in about 6 weeks. Thanks for the consult.   Rosalin Hawking, MD PhD Stroke Neurology 04/18/2017 2:45 PM    To contact Stroke Continuity provider, please refer to [email or web address] After hours, contact General Neurology

## 2017-04-18 NOTE — Progress Notes (Signed)
PULMONARY / CRITICAL CARE MEDICINE   Name: Richard Bennett MRN: [id number] DOB: 09/15/1950    ADMISSION DATE:  04/11/2017 CONSULTATION DATE: 04/11/17  REFERRING MD: ER MD  CHIEF COMPLAINT: Seizures, HTN emergency, Nonketotic hyperglycemia  HISTORY OF PRESENT ILLNESS:   49yoM with hx CVA, DM, HTN, presented to the ER w/ acute encephalopathy and seizures. Initially patient was wandering down hallway of apartment and police were called. EMS arrived to find patient in a bathtub initially unresponsive then combative. He had a seizure during this process and was given versed 5mg . Patient transported to [facility] ER where blood glucose 715 initially and patient with acute encephalopathy. Patient had a head lac that was sutured in the ER. He ha a 2nd seizure in the ER. He continued to be altered combative following this seizure, repeatedly trying to climb out of bed. He received a total of 9mg  Versed and 4mg  Ativan while in the ER. He continued to be agitated and was intubated by ER staff.   SUBJECTIVE:  Has been intermittently more awake, has moved both upper extremities and opened eyes for RN Underwent hemodialysis on 2/14  VITAL SIGNS: BP (!) 145/81   Pulse 89   Temp 100.2 F (37.9 C) (Axillary)   Resp 19   Ht 5\' 8"  (1.727 m)   Wt 86.6 kg (190 lb 14.7 oz)   SpO2 100%   BMI 29.03 kg/m   VENTILATOR SETTINGS: Vent Mode: PSV;CPAP FiO2 (%):  [40 %] 40 % Set Rate:  [20 bmp] 20 bmp Vt Set:  [550 mL] 550 mL PEEP:  [5 cmH20] 5 cmH20 Pressure Support:  [8 cmH20] 8 cmH20 Plateau Pressure:  [11 cmH20-17 cmH20] 17 cmH20  INTAKE / OUTPUT:  Intake/Output Summary (Last 24 hours) at 04/18/2017 1035 Last data filed at 04/18/2017 1000 Gross per 24 hour  Intake 3219.17 ml  Output 5310 ml  Net -2090.83 ml     PHYSICAL EXAMINATION: General: Ill-appearing man on mechanical ventilation HEENT: ET tube in good position, no JVD Pulmonary: Bilateral coarse breath sounds, no wheezing, no significant  crackles Cardiac: Regular, no murmur Abdomen soft, benign, positive bowel sounds Extremities/musculoskeletal: 1-2+ lower extremity edema Neuro: Waking up more frequently although less so for me this morning.  He has had movement of both his left and right upper extremities, very weak on the right LABS:  BMET Recent Labs  Lab 04/17/17 0400 04/17/17 1819 04/18/17 0329  NA 148* 147* 144  K 4.4 4.0 3.8  CL 114* 112* 108  CO2 22 23 25   BUN 60* 64* 48*  CREATININE 3.28* 3.34* 2.68*  GLUCOSE 86 93 109*   Electrolytes Recent Labs  Lab 04/13/17 0556 04/14/17 0608 04/15/17 0304  04/17/17 0400 04/17/17 1819 04/18/17 0329  CALCIUM 7.8* 8.2* 8.1*   < > 8.1* 8.2* 8.1*  MG 1.7 1.9 1.8  --   --   --   --   PHOS 3.0 3.0  --    < > 3.8 4.4 3.8   < > = values in this interval not displayed.   CBC Recent Labs  Lab 04/15/17 0304 04/16/17 0357 04/17/17 1819  WBC 7.7 9.1 8.5  HGB 9.3* 9.1* 8.0*  HCT 30.0* 29.7* 25.7*  PLT 194 189 195   Coag's No results for input(s): APTT, INR in the last 168 hours. Sepsis Markers Recent Labs  Lab 04/11/17 2153  LATICACIDVEN 2.9*    ABG No results for input(s): PHART, PCO2ART, PO2ART in the last 168 hours. Liver Enzymes Recent Labs  Lab 04/17/17 0400 04/17/17 1819 04/17/17 1838 04/18/17 0329  ALT  --   --  21  --   ALBUMIN 1.8* 1.9*  --  1.8*   Cardiac Enzymes No results for input(s): TROPONINI, PROBNP in the last 168 hours.  Glucose Recent Labs  Lab 04/17/17 1129 04/17/17 1507 04/17/17 2103 04/17/17 2339 04/18/17 0418 04/18/17 0809  GLUCAP 139* 128* 97 87 111* 100*   Imaging  SIGNIFICANT EVENTS: 2/8: presented to ER following acute encephalopathy, witnessed sz with EMS and 2nd sz in ER, hyperglycemia, and HTN emergency 04/12/2017 started on Dilantin per neurology  04/13/2017 or sedated and now with right hemiparesis for an MRI 2/14 underwent hemodialysis   LINES/TUBES: 2-PIV ETT 2/8 >> OG tube 2/8 >> Condom  catheter; Foley ordered 04/13/2017 Foley placed by urology secondary to urethral stricture  DISCUSSION: New strokes. Also w/ new AKI. Will need HD. Difficult to prognosticate his neurological recovery in the face of aki, possibly some better with hemodialysis 2/14   ASSESSMENT / PLAN:  Acute encephalpathy; Seizures;  Cocaine abuse;  Acute on chronic CVA felt to be watershed in nature  H/o carotid artery stenosis (c/w prior studies) - MRI:  New areas of acute infarct bilaterally, left greater than right. These appear to be watershed infarcts bilaterally. Negative for Hemorrhage. 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 2. Otherwise negative intracranial MRA. Plan Plan to change Keppra and Vimpat to enteral, consider change to a single agent depending on neurology input Aspirin and Lipitor as ordered Nicardipine to off  Intubated for airway protection.  Ventilator dependent  Possible aspiration -PCXR personally reviewed: tubes/lines in good position. Right basilar atx -MS not supporting extubation -->hoping w/ HD we may be able to  Plan  Tolerates PSVT, currently does not have the mental status for extubation Volume removal both with HD and his auto diuresis Follow chest x-ray Minimize sedating medications Completed empiric antibiotics for presumed aspiration pneumonia  HTN emergency; hx HTN Plan Nicardipine drip weaned off Continue labetalol, clonidine Appear to have benefited from volume removal Blood pressure goal systolic 622-297 given strokes  AKI-on-CKD w/ hyperkalemia and NAG metabolic acidosis Urethral stricture -scr seems to have hit plateau but appears volume overloaded and not making much urine Plan Keep Foley catheter in place, follow urine output Defer hemodialysis on 2/15.  Depending on mental status, volume status he may need another  treatment on 2/16.  We will assess him daily.  Discussed with Dr Marval Regal   Fluid and electrolyte imbalance:  Hyperkalemia, hypernatremia and hyperchloremia  Plan Continue to follow BMP Free water as ordered  Anemia: HE IS [place] WiTNESS  Plan Trend cbc No transfusions   Nonketotic Hyperglycemia vs DKA ->still w/ hyperglycemia Plan Continue Lantus Continue sliding scale insulin as per protocol  DVT prophylaxis: [place] heparin  SUP: ppi  Diet: tubefeed Activity: BR Disposition : ICU FAMILY  - Updates: No family at bedside.  We will need to reach out to them given his debilitation, prognosis. - Inter-disciplinary family meet or Palliative Care meeting due by: 04/18/17  Independent CC time 15 minutes  Baltazar Apo, MD, PhD 04/18/2017, 10:43 AM [facility] [phone] or if no answer [phone]

## 2017-04-18 NOTE — Care Management Note (Signed)
Case Management Note  Patient Details  Name: Richard Bennett MRN: [id number] Date of Birth: 01-27-1951  Subjective/Objective:   Pt admitted with acute enceph and seizures                 Action/Plan:  Pt is from home.  Pt is on the ventilator and now requiring CRRT    Expected Discharge Date:                  Expected Discharge Plan:  Home/Self Care  In-House Referral:     Discharge planning Services  CM Consult  Post Acute Care Choice:    Choice offered to:     DME Arranged:    DME Agency:     HH Arranged:    HH Agency:     Status of Service:     If discussed at [employer], dates discussed:    Additional Comments: 04/18/2017 CRRT discontinued, pt has now received 1 HD session Maryclare Labrador, RN 04/18/2017, 4:15 PM

## 2017-04-18 NOTE — Plan of Care (Signed)
  Not Progressing Skin Integrity: Risk for impaired skin integrity will decrease 04/18/2017 0656 - Not Progressing by Sahej Hauswirth A, RN Note Foams placed to patient's heels to prevent DTI. Currently boggy on left heel. Heels elevated off bed.

## 2017-04-19 LAB — RENAL FUNCTION PANEL
ALBUMIN: 1.7 g/dL — AB (ref 3.5–5.0)
Anion gap: 13 (ref 5–15)
BUN: 59 mg/dL — ABNORMAL HIGH (ref 6–20)
CALCIUM: 8.1 mg/dL — AB (ref 8.9–10.3)
CO2: 26 mmol/L (ref 22–32)
CREATININE: 2.92 mg/dL — AB (ref 0.61–1.24)
Chloride: 102 mmol/L (ref 101–111)
GFR calc non Af Amer: 21 mL/min — ABNORMAL LOW (ref >60)
GFR, EST AFRICAN AMERICAN: 24 mL/min — AB (ref >60)
GLUCOSE: 140 mg/dL — AB (ref 65–99)
PHOSPHORUS: 4.8 mg/dL — AB (ref 2.5–4.6)
Potassium: 3.9 mmol/L (ref 3.5–5.1)
Sodium: 141 mmol/L (ref 135–145)

## 2017-04-19 LAB — GLUCOSE, CAPILLARY
GLUCOSE-CAPILLARY: 133 mg/dL — AB (ref 65–99)
GLUCOSE-CAPILLARY: 159 mg/dL — AB (ref 65–99)
Glucose-Capillary: 122 mg/dL — ABNORMAL HIGH (ref 65–99)
Glucose-Capillary: 127 mg/dL — ABNORMAL HIGH (ref 65–99)
Glucose-Capillary: 152 mg/dL — ABNORMAL HIGH (ref 65–99)
Glucose-Capillary: 171 mg/dL — ABNORMAL HIGH (ref 65–99)

## 2017-04-19 LAB — HEPATITIS B SURFACE ANTIGEN: Hepatitis B Surface Ag: NEGATIVE

## 2017-04-19 LAB — CBC
HCT: 23.8 % — ABNORMAL LOW (ref 39.0–52.0)
Hemoglobin: 7.4 g/dL — ABNORMAL LOW (ref 13.0–17.0)
MCH: 26.3 pg (ref 26.0–34.0)
MCHC: 31.1 g/dL (ref 30.0–36.0)
MCV: 84.7 fL (ref 78.0–100.0)
PLATELETS: 199 10*3/uL (ref 150–400)
RBC: 2.81 MIL/uL — ABNORMAL LOW (ref 4.22–5.81)
RDW: 13.9 % (ref 11.5–15.5)
WBC: 6.3 10*3/uL (ref 4.0–10.5)

## 2017-04-19 LAB — HEPATITIS B SURFACE ANTIBODY,QUALITATIVE: HEP B S AB: REACTIVE

## 2017-04-19 LAB — HEPATITIS B CORE ANTIBODY, TOTAL: HEP B C TOTAL AB: POSITIVE — AB

## 2017-04-19 MED ORDER — ORAL CARE MOUTH RINSE
15.0000 mL | Freq: Four times a day (QID) | OROMUCOSAL | Status: DC
  Administered 2017-04-19: 15 mL via OROMUCOSAL

## 2017-04-19 MED ORDER — ORAL CARE MOUTH RINSE
15.0000 mL | Freq: Four times a day (QID) | OROMUCOSAL | Status: DC
  Administered 2017-04-19 – 2017-04-25 (×26): 15 mL via OROMUCOSAL

## 2017-04-19 NOTE — Progress Notes (Signed)
PULMONARY / CRITICAL CARE MEDICINE   Name: Richard Bennett MRN: [id number] DOB: April 02, 1950    ADMISSION DATE:  04/11/2017 CONSULTATION DATE: 04/11/17  REFERRING MD: ER MD  CHIEF COMPLAINT: Seizures, HTN emergency, Nonketotic hyperglycemia  HISTORY OF PRESENT ILLNESS:   56yoM with hx CVA, DM, HTN, presented to the ER w/ acute encephalopathy and seizures. Initially patient was wandering down hallway of apartment and police were called. EMS arrived to find patient in a bathtub initially unresponsive then combative. He had a seizure during this process and was given versed 5mg . Patient transported to [facility] ER where blood glucose 715 initially and patient with acute encephalopathy. Patient had a head lac that was sutured in the ER. He had a 2nd seizure in the ER. He continued to be altered combative following this seizure, repeatedly trying to climb out of bed. He received a total of 9mg  Versed and 4mg  Ativan while in the ER. He continued to be agitated and was intubated by ER staff for airway protection.   SUBJECTIVE:  Opens eyes to call of name. No following commands, focus or tracking. Tolerating CPAP/PS  Underwent hemodialysis on 2/14  VITAL SIGNS: BP (!) 143/70 (BP Location: Left Arm)   Pulse 76   Temp 99.8 F (37.7 C) (Oral)   Resp (!) 24   Ht 5\' 8"  (1.727 m)   Wt 184 lb 4.9 oz (83.6 kg)   SpO2 100%   BMI 28.02 kg/m   VENTILATOR SETTINGS: Vent Mode: PSV;CPAP FiO2 (%):  [40 %] 40 % Set Rate:  [20 bmp] 20 bmp Vt Set:  [550 mL] 550 mL PEEP:  [5 cmH20] 5 cmH20 Pressure Support:  [8 cmH20] 8 cmH20 Plateau Pressure:  [14 cmH20-15 cmH20] 15 cmH20  INTAKE / OUTPUT:  Intake/Output Summary (Last 24 hours) at 04/19/2017 1220 Last data filed at 04/19/2017 1000 Gross per 24 hour  Intake 2578 ml  Output 3765 ml  Net -1187 ml     PHYSICAL EXAMINATION: General: Ill-appearing man on Vent, tolerating CPAP/PS, not following commands HEENT: NCAT, ETT secure and intact, MMM, Cor Track Feeding  tube Pulmonary: Bilateral coarse breath sounds, no wheezing, few crackles per bases, RR 24 Cardiac: S1, S2, RRR, No MRG, tele NSR rate 72 Abdomen soft, benign, positive bowel sounds Extremities/musculoskeletal: 1-2+ lower extremity edema Neuro: Wakes to call of name, no focus or track ,+movement of both his left and right upper extremities, very weak on the right LABS:  BMET Recent Labs  Lab 04/17/17 1819 04/18/17 0329 04/19/17 0422  NA 147* 144 141  K 4.0 3.8 3.9  CL 112* 108 102  CO2 23 25 26   BUN 64* 48* 59*  CREATININE 3.34* 2.68* 2.92*  GLUCOSE 93 109* 140*   Electrolytes Recent Labs  Lab 04/13/17 0556 04/14/17 0608 04/15/17 0304  04/17/17 1819 04/18/17 0329 04/19/17 0422  CALCIUM 7.8* 8.2* 8.1*   < > 8.2* 8.1* 8.1*  MG 1.7 1.9 1.8  --   --   --   --   PHOS 3.0 3.0  --    < > 4.4 3.8 4.8*   < > = values in this interval not displayed.   CBC Recent Labs  Lab 04/16/17 0357 04/17/17 1819 04/19/17 0422  WBC 9.1 8.5 6.3  HGB 9.1* 8.0* 7.4*  HCT 29.7* 25.7* 23.8*  PLT 189 195 199   Coag's No results for input(s): APTT, INR in the last 168 hours. Sepsis Markers No results for input(s): LATICACIDVEN, PROCALCITON, O2SATVEN in the last 168  hours.  ABG No results for input(s): PHART, PCO2ART, PO2ART in the last 168 hours. Liver Enzymes Recent Labs  Lab 04/17/17 1819 04/17/17 1838 04/18/17 0329 04/19/17 0422  ALT  --  21  --   --   ALBUMIN 1.9*  --  1.8* 1.7*   Cardiac Enzymes No results for input(s): TROPONINI, PROBNP in the last 168 hours.  Glucose Recent Labs  Lab 04/18/17 1630 04/18/17 2017 04/19/17 0014 04/19/17 0357 04/19/17 0859 04/19/17 1212  GLUCAP 154* 140* 127* 133* 152* 159*   Imaging  SIGNIFICANT EVENTS: 2/8: presented to ER following acute encephalopathy, witnessed sz with EMS and 2nd sz in ER, hyperglycemia, and HTN emergency 04/12/2017 started on Dilantin per neurology  04/13/2017 or sedated and now with right hemiparesis for an  MRI 2/14 underwent hemodialysis 2/15 and 2/16>> Tolerating CPAP/PS but not following commands   LINES/TUBES: 2-PIV ETT 2/8 >> OG tube 2/8 >> Condom catheter; Foley ordered 04/13/2017 Foley placed by urology secondary to urethral stricture  Cultures Blood 2/08>> No growth final  DISCUSSION: New strokes. Also w/ new AKI. Will need HD. Difficult to prognosticate his neurological recovery in the face of aki, possibly some better with hemodialysis 2/14   ASSESSMENT / PLAN:  Acute encephalpathy; Seizures;  Cocaine abuse;  Acute on chronic CVA felt to be watershed in nature  H/o carotid artery stenosis (c/w prior studies) - MRI:  New areas of acute infarct bilaterally, left greater than right. These appear to be watershed infarcts bilaterally. Negative for Hemorrhage. 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 2. Otherwise negative intracranial MRA. Plan Continue  Keppra and Vimpat per  Enteral dosing,  Consider change to a single agent depending on neurology input Aspirin and Lipitor as ordered Nicardipine to off  Intubated for airway protection.  Ventilator dependent  Possible aspiration>> T Max 100.2>> WBC wnl -MS not supporting extubation -->hoping w/ HD we may be able to - Sedation off , no prn's given Plan  Tolerates PSVT, currently does not have the mental status for extubation Volume removal both with HD and his auto diuresis Follow chest x-ray 2/17 Minimize sedating medications Completed empiric antibiotics for presumed aspiration pneumonia Follow WBC and Fever curve Will need to consider trach if mental status prevents extubation  HTN emergency; hx HTN Plan Nicardipine drip weaned off Continue labetalol, clonidine Appear to have benefited from volume removal Blood pressure goal systolic 637-858 given strokes  AKI-on-CKD w/ hyperkalemia  and NAG metabolic acidosis in setting of watershed  Stroke/ hypertensive emergency Urethral stricture -scr seems to have hit plateau  - Good UP after HD - Stabilizing BUN and Creatinine - Per renal will hold on additional HD for now.( Appreciate assist)  Plan Keep Foley catheter in place, follow urine output Defer hemodialysis on 2/16 as BUN / Creatinine stabilizing and improved  UO Continue daily assessment and HD as needed    Fluid and electrolyte imbalance:   Hyperkalemia, hypernatremia and hyperchloremia  Plan Continue to follow BMP Replete electrolytes as needed Free water as ordered  Anemia: HE IS [place] WiTNESS  Plan Trend cbc No transfusions   Nonketotic Hyperglycemia vs DKA ->still w/ hyperglycemia Plan Trend BMET Continue Lantus CBG Q 4 Continue sliding scale insulin as per protocol  DVT prophylaxis: [place] heparin  SUP: ppi  Diet: tubefeed Activity: BR Disposition : ICU FAMILY  - Updates: No family  at bedside.  We will need to reach out to them given his debilitation, prognosis. May need trach/ Peg discussion vs goals of care - Inter-disciplinary family meet or Palliative Care meeting due by: 04/18/17    Magdalen Spatz, AGACNP-BC 04/19/2017, 12:20 PM [facility] Pager: [phone]

## 2017-04-19 NOTE — Plan of Care (Signed)
  Progressing Clinical Measurements: Diagnostic test results will improve 04/19/2017 0615 - Progressing by Raedyn Wenke, Earmon Phoenix, RN Note Patient startled when name called- possible sign of recognition. Seems to be moving limbs more frequently.  Skin Integrity: Risk for impaired skin integrity will decrease 04/19/2017 0615 - Progressing by Elfrieda Espino A, RN Note Prevalon boots ordered on day shift for patient to protect heels from breakdown.

## 2017-04-19 NOTE — Progress Notes (Signed)
S: opens eyes to voice but not following commands O:BP (!) 145/72   Pulse 73   Temp 99.8 F (37.7 C) (Oral)   Resp 20   Ht 5' 8"  (1.727 m)   Wt 83.6 kg (184 lb 4.9 oz)   SpO2 100%   BMI 28.02 kg/m   Intake/Output Summary (Last 24 hours) at 04/19/2017 0929 Last data filed at 04/19/2017 0900 Gross per 24 hour  Intake 2778 ml  Output 3665 ml  Net -887 ml   Intake/Output: I/O last 3 completed shifts: In: 4522.2 [I.V.:360.2; NG/GT:3650; IV HYWVPXTGG:269] Out: 4854 [OEVOJ:5009; Other:1000]  Intake/Output this shift:  Total I/O In: 152 [I.V.:20; NG/GT:70; IV Piggyback:62] Out: 250 [Urine:250] Weight change: -6.2 kg (-10.7 oz) Gen: intubated CVS: no rub Resp: cta Abd: +BS, soft, NT/ND Ext: trace edema  Recent Labs  Lab 04/13/17 0556 04/14/17 0608 04/15/17 0304 04/16/17 0357 04/16/17 1811 04/17/17 0400 04/17/17 1819 04/17/17 1838 04/18/17 0329 04/19/17 0422  NA 144 148* 146* 146* 148* 148* 147*  --  144 141  K 4.2 4.5 5.7* 6.2* 4.5 4.4 4.0  --  3.8 3.9  CL 112* 115* 115* 116* 116* 114* 112*  --  108 102  CO2 20* 22 20* 19* 20* 22 23  --  25 26  GLUCOSE 135* 152* 281* 239* 128* 86 93  --  109* 140*  BUN 29* 31* 41* 53* 58* 60* 64*  --  48* 59*  CREATININE 3.04* 2.95* 3.19* 3.26* 3.31* 3.28* 3.34*  --  2.68* 2.92*  ALBUMIN  --   --   --   --  1.8* 1.8* 1.9*  --  1.8* 1.7*  CALCIUM 7.8* 8.2* 8.1* 8.2* 7.9* 8.1* 8.2*  --  8.1* 8.1*  PHOS 3.0 3.0  --   --  3.1 3.8 4.4  --  3.8 4.8*  ALT  --   --   --   --   --   --   --  21  --   --    Liver Function Tests: Recent Labs  Lab 04/17/17 1819 04/17/17 1838 04/18/17 0329 04/19/17 0422  ALT  --  21  --   --   ALBUMIN 1.9*  --  1.8* 1.7*   No results for input(s): LIPASE, AMYLASE in the last 168 hours. No results for input(s): AMMONIA in the last 168 hours. CBC: Recent Labs  Lab 04/14/17 0608 04/15/17 0304 04/16/17 0357 04/17/17 1819 04/19/17 0422  WBC 8.3 7.7 9.1 8.5 6.3  NEUTROABS 5.9  --   --   --   --   HGB  9.3* 9.3* 9.1* 8.0* 7.4*  HCT 29.6* 30.0* 29.7* 25.7* 23.8*  MCV 84.6 85.7 85.3 84.5 84.7  PLT 154 194 189 195 199   Cardiac Enzymes: No results for input(s): CKTOTAL, CKMB, CKMBINDEX, TROPONINI in the last 168 hours. CBG: Recent Labs  Lab 04/18/17 1630 04/18/17 2017 04/19/17 0014 04/19/17 0357 04/19/17 0859  GLUCAP 154* 140* 127* 133* 152*    Iron Studies: No results for input(s): IRON, TIBC, TRANSFERRIN, FERRITIN in the last 72 hours. Studies/Results: No results found. Marland Kitchen amLODipine  10 mg Per Tube Daily  . aspirin  325 mg Per Tube Daily  . atorvastatin  40 mg Per Tube q1800  . chlorhexidine gluconate (MEDLINE KIT)  15 mL Mouth Rinse BID  . Chlorhexidine Gluconate Cloth  6 each Topical Daily  . cloNIDine  0.2 mg Per Tube BID  . free water  200 mL Per Tube Q8H  .  heparin  5,000 Units Subcutaneous Q8H  . insulin aspart  0-20 Units Subcutaneous Q4H  . insulin aspart  4 Units Subcutaneous Q4H  . insulin glargine  25 Units Subcutaneous Daily  . labetalol  200 mg Per Tube Q8H  . lacosamide  100 mg Per Tube BID  . levETIRAcetam  500 mg Per Tube BID  . mouth rinse  15 mL Mouth Rinse QID  . pantoprazole sodium  40 mg Per Tube Daily  . thiamine  100 mg Per Tube Daily    BMET    Component Value Date/Time   NA 141 04/19/2017 0422   K 3.9 04/19/2017 0422   CL 102 04/19/2017 0422   CO2 26 04/19/2017 0422   GLUCOSE 140 (H) 04/19/2017 0422   BUN 59 (H) 04/19/2017 0422   CREATININE 2.92 (H) 04/19/2017 0422   CALCIUM 8.1 (L) 04/19/2017 0422   GFRNONAA 21 (L) 04/19/2017 0422   GFRAA 24 (L) 04/19/2017 0422   CBC    Component Value Date/Time   WBC 6.3 04/19/2017 0422   RBC 2.81 (L) 04/19/2017 0422   HGB 7.4 (L) 04/19/2017 0422   HCT 23.8 (L) 04/19/2017 0422   PLT 199 04/19/2017 0422   MCV 84.7 04/19/2017 0422   MCH 26.3 04/19/2017 0422   MCHC 31.1 04/19/2017 0422   RDW 13.9 04/19/2017 0422   LYMPHSABS 1.5 04/14/2017 0608   MONOABS 0.7 04/14/2017 0608   EOSABS 0.2  04/14/2017 0608   BASOSABS 0.0 04/14/2017 0608     Assessment/Plan:  1. AKI/CKD stage 3 (baseline Scr 1.5-1.9) in setting of hypertensive emergency and stroke.  He did have one session of HD on 04/17/17 with marked improvement of alertness.  He has had marked improvement in UOP and BUN Cr have remained stable.  Will hold off on further HD for now given marked increase in UOP and improved mental status. 2. 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  Slow improvement of mentation. 3. Seizure- on Keppra and Vimpat 4. Hypertensive emergency- goal SBP 140-160 5. Bilateral carotid stenosis 6. Cocaine abuse 7. VDRF- per PCCM 8. Anemia of critical illness 9. DM 10. Disposition- unsure of recovery following a large bilateral strokes.  Neuro has signed off.  Donetta Potts, MD [employer] [phone]

## 2017-04-20 ENCOUNTER — Inpatient Hospital Stay (HOSPITAL_COMMUNITY): Payer: Medicare HMO

## 2017-04-20 LAB — BASIC METABOLIC PANEL
Anion gap: 13 (ref 5–15)
BUN: 71 mg/dL — AB (ref 6–20)
CALCIUM: 8.2 mg/dL — AB (ref 8.9–10.3)
CO2: 27 mmol/L (ref 22–32)
Chloride: 100 mmol/L — ABNORMAL LOW (ref 101–111)
Creatinine, Ser: 3.11 mg/dL — ABNORMAL HIGH (ref 0.61–1.24)
GFR calc non Af Amer: 19 mL/min — ABNORMAL LOW (ref >60)
GFR, EST AFRICAN AMERICAN: 22 mL/min — AB (ref >60)
Glucose, Bld: 97 mg/dL (ref 65–99)
Potassium: 3.5 mmol/L (ref 3.5–5.1)
SODIUM: 140 mmol/L (ref 135–145)

## 2017-04-20 LAB — RENAL FUNCTION PANEL
ALBUMIN: 1.8 g/dL — AB (ref 3.5–5.0)
ANION GAP: 15 (ref 5–15)
BUN: 69 mg/dL — ABNORMAL HIGH (ref 6–20)
CALCIUM: 8.2 mg/dL — AB (ref 8.9–10.3)
CO2: 26 mmol/L (ref 22–32)
Chloride: 99 mmol/L — ABNORMAL LOW (ref 101–111)
Creatinine, Ser: 3.11 mg/dL — ABNORMAL HIGH (ref 0.61–1.24)
GFR calc Af Amer: 22 mL/min — ABNORMAL LOW (ref >60)
GFR, EST NON AFRICAN AMERICAN: 19 mL/min — AB (ref >60)
Glucose, Bld: 96 mg/dL (ref 65–99)
PHOSPHORUS: 5.2 mg/dL — AB (ref 2.5–4.6)
POTASSIUM: 3.5 mmol/L (ref 3.5–5.1)
SODIUM: 140 mmol/L (ref 135–145)

## 2017-04-20 LAB — GLUCOSE, CAPILLARY
GLUCOSE-CAPILLARY: 96 mg/dL (ref 65–99)
GLUCOSE-CAPILLARY: 161 mg/dL — AB (ref 65–99)
GLUCOSE-CAPILLARY: 152 mg/dL — AB (ref 65–99)
GLUCOSE-CAPILLARY: 71 mg/dL (ref 65–99)
Glucose-Capillary: 85 mg/dL (ref 65–99)
Glucose-Capillary: 82 mg/dL (ref 65–99)
Glucose-Capillary: 74 mg/dL (ref 65–99)

## 2017-04-20 LAB — CBC
HEMATOCRIT: 22.8 % — AB (ref 39.0–52.0)
HEMOGLOBIN: 7.3 g/dL — AB (ref 13.0–17.0)
MCH: 26.5 pg (ref 26.0–34.0)
MCHC: 32 g/dL (ref 30.0–36.0)
MCV: 82.9 fL (ref 78.0–100.0)
Platelets: 209 10*3/uL (ref 150–400)
RBC: 2.75 MIL/uL — AB (ref 4.22–5.81)
RDW: 13.7 % (ref 11.5–15.5)
WBC: 5.5 10*3/uL (ref 4.0–10.5)

## 2017-04-20 NOTE — Progress Notes (Signed)
S: no events overnight O:BP (!) 168/80   Pulse 71   Temp 98.7 F (37.1 C) (Axillary)   Resp (!) 22   Ht _0  (1.727 m)   Wt 82.8 kg (182 lb 8.7 oz)   SpO2 100%   BMI 27.76 kg/m   Intake/Output Summary (Last 24 hours) at 04/20/2017 1321 Last data filed at 04/20/2017 1100 Gross per 24 hour  Intake 2346 ml  Output 3965 ml  Net -1619 ml   Intake/Output: I/O last 3 completed shifts: In: 3982 [I.V.:360; NG/GT:3250; IV Piggyback:372] Out: 3149 [Urine:5745]  Intake/Output this shift:  Total I/O In: 320 [I.V.:40; NG/GT:280] Out: 1160 [Urine:1000; Emesis/NG output:160] Weight change: -0.8 kg (-12.2 oz) Gen: eyes open, tracking but not following commands CVS: no rub Resp: cta Abd: benign Ext: 1+ edema  Recent Labs  Lab 04/14/17 0608  04/16/17 0357 04/16/17 1811 04/17/17 0400 04/17/17 1819 04/17/17 1838 04/18/17 0329 04/19/17 0422 04/20/17 0349  NA 148*   < > 146* 148* 148* 147*  --  144 141 140  140  K 4.5   < > 6.2* 4.5 4.4 4.0  --  3.8 3.9 3.5  3.5  CL 115*   < > 116* 116* 114* 112*  --  108 102 99*  100*  CO2 22   < > 19* 20* 22 23  --  _1 GLUCOSE 152*   < > 239* 128* 86 93  --  109* 140* 96  97  BUN 31*   < > 53* 58* 60* 64*  --  48* 59* 69*  71*  CREATININE 2.95*   < > 3.26* 3.31* 3.28* 3.34*  --  2.68* 2.92* 3.11*  3.11*  ALBUMIN  --   --   --  1.8* 1.8* 1.9*  --  1.8* 1.7* 1.8*  CALCIUM 8.2*   < > 8.2* 7.9* 8.1* 8.2*  --  8.1* 8.1* 8.2*  8.2*  PHOS 3.0  --   --  3.1 3.8 4.4  --  3.8 4.8* 5.2*  ALT  --   --   --   --   --   --  21  --   --   --    < > = values in this interval not displayed.   Liver Function Tests: Recent Labs  Lab 04/17/17 1838 04/18/17 0329 04/19/17 0422 04/20/17 0349  ALT 21  --   --   --   ALBUMIN  --  1.8* 1.7* 1.8*   No results for input(s): LIPASE, AMYLASE in the last 168 hours. No results for input(s): AMMONIA in the last 168 hours. CBC: Recent Labs  Lab 04/14/17 0608 04/15/17 0304 04/16/17 0357  04/17/17 1819 04/19/17 0422 04/20/17 0349  WBC 8.3 7.7 9.1 8.5 6.3 5.5  NEUTROABS 5.9  --   --   --   --   --   HGB 9.3* 9.3* 9.1* 8.0* 7.4* 7.3*  HCT 29.6* 30.0* 29.7* 25.7* 23.8* 22.8*  MCV 84.6 85.7 85.3 84.5 84.7 82.9  PLT 154 194 189 195 199 209   Cardiac Enzymes: No results for input(s): CKTOTAL, CKMB, CKMBINDEX, TROPONINI in the last 168 hours. CBG: Recent Labs  Lab 04/20/17 0046 04/20/17 0323 04/20/17 0748 04/20/17 1145 04/20/17 1302  GLUCAP 85 82 96 161* 152*    Iron Studies: No results for input(s): IRON, TIBC, TRANSFERRIN, FERRITIN in the last 72 hours. Studies/Results: Dg Chest Port 1 View  Result Date: 04/20/2017 CLINICAL DATA:  Respiratory  failure, ventilatory support, renal failure EXAM: PORTABLE CHEST 1 VIEW COMPARISON:  04/16/2017 FINDINGS: Endotracheal tube, right IJ temporary dialysis catheter, and NG tube are stable in position. Monitor leads overlie the chest. Stable cardiomegaly. Resolved bibasilar atelectasis. Negative for edema, effusion, or pneumothorax. Atherosclerosis of the aorta. Stable metallic radiopaque foreign body over the right chest. IMPRESSION: Cardiomegaly with resolving bibasilar atelectasis. Electronically Signed   By: Jerilynn Mages.  Shick M.D.   On: 04/20/2017 09:04   . amLODipine  10 mg Per Tube Daily  . aspirin  325 mg Per Tube Daily  . atorvastatin  40 mg Per Tube q1800  . chlorhexidine gluconate (MEDLINE KIT)  15 mL Mouth Rinse BID  . Chlorhexidine Gluconate Cloth  6 each Topical Daily  . cloNIDine  0.2 mg Per Tube BID  . free water  200 mL Per Tube Q8H  . heparin  5,000 Units Subcutaneous Q8H  . insulin aspart  0-20 Units Subcutaneous Q4H  . insulin aspart  4 Units Subcutaneous Q4H  . insulin glargine  25 Units Subcutaneous Daily  . labetalol  200 mg Per Tube Q8H  . lacosamide  100 mg Per Tube BID  . levETIRAcetam  500 mg Per Tube BID  . mouth rinse  15 mL Mouth Rinse QID  . pantoprazole sodium  40 mg Per Tube Daily  . thiamine  100 mg  Per Tube Daily    BMET    Component Value Date/Time   NA 140 04/20/2017 0349   NA 140 04/20/2017 0349   K 3.5 04/20/2017 0349   K 3.5 04/20/2017 0349   CL 99 (L) 04/20/2017 0349   CL 100 (L) 04/20/2017 0349   CO2 26 04/20/2017 0349   CO2 27 04/20/2017 0349   GLUCOSE 96 04/20/2017 0349   GLUCOSE 97 04/20/2017 0349   BUN 69 (H) 04/20/2017 0349   BUN 71 (H) 04/20/2017 0349   CREATININE 3.11 (H) 04/20/2017 0349   CREATININE 3.11 (H) 04/20/2017 0349   CALCIUM 8.2 (L) 04/20/2017 0349   CALCIUM 8.2 (L) 04/20/2017 0349   GFRNONAA 19 (L) 04/20/2017 0349   GFRNONAA 19 (L) 04/20/2017 0349   GFRAA 22 (L) 04/20/2017 0349   GFRAA 22 (L) 04/20/2017 0349   CBC    Component Value Date/Time   WBC 5.5 04/20/2017 0349   RBC 2.75 (L) 04/20/2017 0349   HGB 7.3 (L) 04/20/2017 0349   HCT 22.8 (L) 04/20/2017 0349   PLT 209 04/20/2017 0349   MCV 82.9 04/20/2017 0349   MCH 26.5 04/20/2017 0349   MCHC 32.0 04/20/2017 0349   RDW 13.7 04/20/2017 0349   LYMPHSABS 1.5 04/14/2017 0608   MONOABS 0.7 04/14/2017 0608   EOSABS 0.2 04/14/2017 0608   BASOSABS 0.0 04/14/2017 0608     Assessment/Plan:  1. AKI/CKD stage 3 (baseline Scr 1.5-1.9) in setting of hypertensive emergency and stroke.  He did have one session of HD on 04/17/17 with marked improvement of alertness.  He has had marked improvement in UOP and BUN Cr have remained stable.  Will hold off on further HD for now given marked increase in UOP and improved mental status. 2. 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  Slow improvement of mentation but not communicative. 3. Seizure- on Keppra and Vimpat 4. Hypertensive emergency- goal SBP 140-160 5. Bilateral carotid stenosis 6. Cocaine abuse 7. VDRF- per PCCM 8. Anemia of critical illness 9. DM 10. Disposition- unsure  of recovery following a large bilateral strokes.  Neuro  has signed off.   Donetta Potts, MD [employer] [phone]

## 2017-04-20 NOTE — Progress Notes (Signed)
PULMONARY / CRITICAL CARE MEDICINE   Name: Richard Bennett MRN: [id number] DOB: 10-09-1950    ADMISSION DATE:  04/11/2017 CONSULTATION DATE: 04/11/17  REFERRING MD: ER MD  CHIEF COMPLAINT: Seizures, HTN emergency, Nonketotic hyperglycemia  HISTORY OF PRESENT ILLNESS:   33yoM with hx CVA, DM, HTN, presented to the ER w/ acute encephalopathy and seizures. Initially patient was wandering down hallway of apartment and police were called. EMS arrived to find patient in a bathtub initially unresponsive then combative. He had a seizure during this process and was given versed 5mg . Patient transported to [facility] ER where blood glucose 715 initially and patient with acute encephalopathy. Patient had a head lac that was sutured in the ER. He had a 2nd seizure in the ER. He continued to be altered combative following this seizure, repeatedly trying to climb out of bed. He received a total of 9mg  Versed and 4mg  Ativan while in the ER. He continued to be agitated and was intubated by ER staff for airway protection.   SUBJECTIVE:  Slight improvement in mental status, opens eyes, may be tracking No other significant clinical changes reported  VITAL SIGNS: BP (!) 168/80   Pulse 71   Temp 99.7 F (37.6 C)   Resp (!) 22   Ht 5\' 8"  (1.727 m)   Wt 82.8 kg (182 lb 8.7 oz)   SpO2 100%   BMI 27.76 kg/m   VENTILATOR SETTINGS: Vent Mode: PRVC FiO2 (%):  [40 %] 40 % Set Rate:  [20 bmp] 20 bmp Vt Set:  [550 mL] 550 mL PEEP:  [5 cmH20] 5 cmH20 Pressure Support:  [8 cmH20] 8 cmH20 Plateau Pressure:  [16 cmH20-19 cmH20] 16 cmH20  INTAKE / OUTPUT:  Intake/Output Summary (Last 24 hours) at 04/20/2017 1222 Last data filed at 04/20/2017 1100 Gross per 24 hour  Intake 2426 ml  Output 3965 ml  Net -1539 ml     PHYSICAL EXAMINATION: General: Ill-appearing man, ventilated, tolerating pressure support HEENT: ET tube in place, pupils equal, oropharynx clear Pulmonary: Few bilateral basilar inspiratory crackles,  otherwise clear Cardiac: Regular, no murmur Abdomen soft, positive bowel sounds Extremities/musculoskeletal: 1+ lower extremity edema Neuro: Opens his eyes to voice, not entirely clear that he is really tracking, moves his bilateral upper extremities, very weak on the right LABS:  BMET Recent Labs  Lab 04/18/17 0329 04/19/17 0422 04/20/17 0349  NA 144 141 140  140  K 3.8 3.9 3.5  3.5  CL 108 102 99*  100*  CO2 25 26 26  27   BUN 48* 59* 69*  71*  CREATININE 2.68* 2.92* 3.11*  3.11*  GLUCOSE 109* 140* 96  97   Electrolytes Recent Labs  Lab 04/14/17 0608 04/15/17 0304  04/18/17 0329 04/19/17 0422 04/20/17 0349  CALCIUM 8.2* 8.1*   < > 8.1* 8.1* 8.2*  8.2*  MG 1.9 1.8  --   --   --   --   PHOS 3.0  --    < > 3.8 4.8* 5.2*   < > = values in this interval not displayed.   CBC Recent Labs  Lab 04/17/17 1819 04/19/17 0422 04/20/17 0349  WBC 8.5 6.3 5.5  HGB 8.0* 7.4* 7.3*  HCT 25.7* 23.8* 22.8*  PLT 195 199 209   Coag's No results for input(s): APTT, INR in the last 168 hours. Sepsis Markers No results for input(s): LATICACIDVEN, PROCALCITON, O2SATVEN in the last 168 hours.  ABG No results for input(s): PHART, PCO2ART, PO2ART in the last 168  hours. Liver Enzymes Recent Labs  Lab 04/17/17 1838 04/18/17 0329 04/19/17 0422 04/20/17 0349  ALT 21  --   --   --   ALBUMIN  --  1.8* 1.7* 1.8*   Cardiac Enzymes No results for input(s): TROPONINI, PROBNP in the last 168 hours.  Glucose Recent Labs  Lab 04/19/17 1627 04/19/17 2042 04/20/17 0046 04/20/17 0323 04/20/17 0748 04/20/17 1145  GLUCAP 171* 122* 85 82 96 161*   Imaging  SIGNIFICANT EVENTS: 2/8: presented to ER following acute encephalopathy, witnessed sz with EMS and 2nd sz in ER, hyperglycemia, and HTN emergency 04/12/2017 started on Dilantin per neurology  04/13/2017 or sedated and now with right hemiparesis for an MRI 2/14 underwent hemodialysis 2/15 and 2/16>> Tolerating CPAP/PS but not  following commands   LINES/TUBES: 2-PIV ETT 2/8 >> OG tube 2/8 >> Condom catheter; Foley ordered 04/13/2017 Foley placed by urology secondary to urethral stricture  Cultures Blood 2/08>> No growth final  DISCUSSION: New strokes. Also w/ new AKI. Will need HD. Difficult to prognosticate his neurological recovery in the face of aki, possibly some better with hemodialysis 2/14   ASSESSMENT / PLAN:  Acute encephalpathy; Seizures;  Cocaine abuse;  Acute on chronic CVA felt to be watershed in nature  H/o carotid artery stenosis (c/w prior studies) - MRI:  New areas of acute infarct bilaterally, left greater than right. These appear to be watershed infarcts bilaterally. Negative for Hemorrhage. 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 2. Otherwise negative intracranial MRA. Plan Continue enteral Keppra and Vimpat Consider change to single agent depending on neurology input, question whether Vimpat may be contributing to his somnolence.  No evidence of continuous seizures Aspirin and Lipitor as ordered Nicardipine is off  Intubated for airway protection.  Ventilator dependent  Possible aspiration>> T Max 100.2>> WBC wnl -MS not supporting extubation -->hoping w/ HD we may be able to - Sedation off , no prn's given Plan  Tolerates pressure support but does not have the mental status for an extubation.  Depending on his mental status improvement we will need to consider tracheostomy versus withdrawal of care.  Plan to discuss with family once we believe his neurological function is plateaued We will follow intermittent chest x-ray Completed empiric antibiotics for possible aspiration pneumonia  HTN emergency; hx HTN Plan Nicardipine off Continue labetalol, clonidine Benefited from volume removal, now auto diuresing Blood pressure goal systolic less than  740  AKI-on-CKD w/ hyperkalemia and NAG metabolic acidosis in setting of watershed  Stroke/ hypertensive emergency Urethral stricture -scr seems to have hit plateau  - Good UP after HD - Stabilizing BUN and Creatinine - Per renal will hold on additional HD for now.( Appreciate assist)  Plan Deferring hemodialysis, following for plateau and then improvement in his renal function.  His urine output has improved. Appreciate nephrology assistance   Fluid and electrolyte imbalance:   Hyperkalemia, hypernatremia and hyperchloremia  Plan Follow BMP Replace electrolytes as indicated Continue free water  Anemia: HE IS JEHOVAH WiTNESS  Plan Follow CBC No transfusions  Nonketotic Hyperglycemia vs DKA ->still w/ hyperglycemia Plan Trend BMET Continue Lantus CBG Q 4 Sliding scale insulin per protocol  DVT prophylaxis: [place] heparin  SUP: ppi  Diet: tubefeed Activity: BR Disposition : ICU FAMILY  - Updates: No family at bedside.  We discussed his status earlier in the week with his  brother.  Depending on the neurological recovery will need to discuss goals of care, whether he would want prolonged ventilation, tracheostomy, etc. - Inter-disciplinary family meet or Palliative Care meeting due by: 04/18/17  Independent CC time 35 minutes  Baltazar Apo, MD, PhD 04/20/2017, 12:31 PM [facility] [phone] or if no answer [phone]

## 2017-04-20 NOTE — Plan of Care (Signed)
  Progressing Education: Knowledge of General Education information will improve 04/20/2017 0615 - Progressing by Milford Cage, RN Health Behavior/Discharge Planning: Ability to manage health-related needs will improve 04/20/2017 0615 - Progressing by Milford Cage, RN Clinical Measurements: Ability to maintain clinical measurements within normal limits will improve 04/20/2017 0615 - Progressing by Milford Cage, RN Will remain free from infection 04/20/2017 0615 - Progressing by Milford Cage, RN Diagnostic test results will improve 04/20/2017 0615 - Progressing by Milford Cage, RN Respiratory complications will improve 04/20/2017 0615 - Progressing by Milford Cage, RN Cardiovascular complication will be avoided 04/20/2017 0615 - Progressing by Tarena Gockley A, RN Elimination: Will not experience complications related to bowel motility 04/20/2017 0615 - Progressing by Pieter Partridge A, RN Skin Integrity: Risk for impaired skin integrity will decrease 04/20/2017 0615 - Progressing by Milford Cage, RN Spiritual Needs Ability to function at adequate level 04/20/2017 0615 - Progressing by Milford Cage, RN   Not Progressing Activity: Risk for activity intolerance will decrease 04/20/2017 0615 - Not Progressing by Khadijatou Borak A, RN Elimination: Will not experience complications related to urinary retention 04/20/2017 0615 - Not Progressing by Kaveon Blatz A, RN Activity: Ability to tolerate increased activity will improve 04/20/2017 0615 - Not Progressing by Milford Cage, RN Respiratory: Ability to maintain a clear airway and adequate ventilation will improve 04/20/2017 0615 - Not Progressing by Pieter Partridge A, RN Role Relationship: Method of communication will improve 04/20/2017 0615 - Not Progressing by Milford Cage, RN

## 2017-04-21 LAB — CBC
HEMATOCRIT: 24 % — AB (ref 39.0–52.0)
HEMOGLOBIN: 7.6 g/dL — AB (ref 13.0–17.0)
MCH: 26.4 pg (ref 26.0–34.0)
MCHC: 31.7 g/dL (ref 30.0–36.0)
MCV: 83.3 fL (ref 78.0–100.0)
Platelets: 227 10*3/uL (ref 150–400)
RBC: 2.88 MIL/uL — ABNORMAL LOW (ref 4.22–5.81)
RDW: 13.7 % (ref 11.5–15.5)
WBC: 6.3 10*3/uL (ref 4.0–10.5)

## 2017-04-21 LAB — GLUCOSE, CAPILLARY
GLUCOSE-CAPILLARY: 159 mg/dL — AB (ref 65–99)
GLUCOSE-CAPILLARY: 175 mg/dL — AB (ref 65–99)
GLUCOSE-CAPILLARY: 205 mg/dL — AB (ref 65–99)
Glucose-Capillary: 127 mg/dL — ABNORMAL HIGH (ref 65–99)
Glucose-Capillary: 181 mg/dL — ABNORMAL HIGH (ref 65–99)
Glucose-Capillary: 182 mg/dL — ABNORMAL HIGH (ref 65–99)
Glucose-Capillary: 209 mg/dL — ABNORMAL HIGH (ref 65–99)

## 2017-04-21 LAB — RENAL FUNCTION PANEL
ANION GAP: 15 (ref 5–15)
Albumin: 1.9 g/dL — ABNORMAL LOW (ref 3.5–5.0)
BUN: 80 mg/dL — ABNORMAL HIGH (ref 6–20)
CO2: 27 mmol/L (ref 22–32)
Calcium: 8.2 mg/dL — ABNORMAL LOW (ref 8.9–10.3)
Chloride: 96 mmol/L — ABNORMAL LOW (ref 101–111)
Creatinine, Ser: 3.28 mg/dL — ABNORMAL HIGH (ref 0.61–1.24)
GFR calc Af Amer: 21 mL/min — ABNORMAL LOW (ref >60)
GFR, EST NON AFRICAN AMERICAN: 18 mL/min — AB (ref >60)
GLUCOSE: 190 mg/dL — AB (ref 65–99)
PHOSPHORUS: 6.1 mg/dL — AB (ref 2.5–4.6)
POTASSIUM: 3.7 mmol/L (ref 3.5–5.1)
Sodium: 138 mmol/L (ref 135–145)

## 2017-04-21 NOTE — Progress Notes (Signed)
PULMONARY / CRITICAL CARE MEDICINE   Name: Richard Bennett MRN: [id number] DOB: 23-Oct-1950    ADMISSION DATE:  04/11/2017 CONSULTATION DATE: 04/11/17  REFERRING MD: ER MD  CHIEF COMPLAINT: Seizures, HTN emergency, Nonketotic hyperglycemia  HISTORY OF PRESENT ILLNESS:   59yoM with hx CVA, DM, HTN, presented to the ER w/ acute encephalopathy and seizures. Initially patient was wandering down hallway of apartment and police were called. EMS arrived to find patient in a bathtub initially unresponsive then combative. He had a seizure during this process and was given versed 5mg . Patient transported to [facility] ER where blood glucose 715 initially and patient with acute encephalopathy. Patient had a head lac that was sutured in the ER. He had a 2nd seizure in the ER. He continued to be altered combative following this seizure, repeatedly trying to climb out of bed. He received a total of 9mg  Versed and 4mg  Ativan while in the ER. He continued to be agitated and was intubated by ER staff for airway protection.   SUBJECTIVE:  No sig change overnight.  NO further HD plans for now per renal.  Mental status improving but very slowly.  Opens eyes to voice, tracking, still not following commands.    VITAL SIGNS: BP 138/67 (BP Location: Left Arm)   Pulse 68   Temp 99.3 F (37.4 C) (Oral)   Resp 20   Ht 5\' 8"  (1.727 m)   Wt 79.6 kg (175 lb 7.8 oz)   SpO2 100%   BMI 26.68 kg/m   VENTILATOR SETTINGS: Vent Mode: PRVC FiO2 (%):  [40 %] 40 % Set Rate:  [20 bmp] 20 bmp Vt Set:  [550 mL] 550 mL PEEP:  [5 cmH20] 5 cmH20 Pressure Support:  [8 cmH20] 8 cmH20 Plateau Pressure:  [16 cmH20-17 cmH20] 16 cmH20  INTAKE / OUTPUT:  Intake/Output Summary (Last 24 hours) at 04/21/2017 0912 Last data filed at 04/21/2017 0800 Gross per 24 hour  Intake 2518 ml  Output 4210 ml  Net -1692 ml     PHYSICAL EXAMINATION: General: chronically ill appearing male, NAD on vent  HEENT: ET tube in place, pupils equal,  oropharynx clear Pulmonary: resps even non labored on vent, few crackles otherwise clear Cardiac: s1s2 Regular, no murmur Abdomen soft, positive bowel sounds Extremities/musculoskeletal: anasarca but improving  Neuro:  Opens eyes to name, tracks intermittently, definitely responds to name, not following commands, moves L arm   LABS:  BMET Recent Labs  Lab 04/19/17 0422 04/20/17 0349 04/21/17 0352  NA 141 140  140 138  K 3.9 3.5  3.5 3.7  CL 102 99*  100* 96*  CO2 26 26  27 27   BUN 59* 69*  71* 80*  CREATININE 2.92* 3.11*  3.11* 3.28*  GLUCOSE 140* 96  97 190*   Electrolytes Recent Labs  Lab 04/15/17 0304  04/19/17 0422 04/20/17 0349 04/21/17 0352  CALCIUM 8.1*   < > 8.1* 8.2*  8.2* 8.2*  MG 1.8  --   --   --   --   PHOS  --    < > 4.8* 5.2* 6.1*   < > = values in this interval not displayed.   CBC Recent Labs  Lab 04/19/17 0422 04/20/17 0349 04/21/17 0352  WBC 6.3 5.5 6.3  HGB 7.4* 7.3* 7.6*  HCT 23.8* 22.8* 24.0*  PLT 199 209 227   Coag's No results for input(s): APTT, INR in the last 168 hours. Sepsis Markers No results for input(s): LATICACIDVEN, PROCALCITON, O2SATVEN in the  last 168 hours.  ABG No results for input(s): PHART, PCO2ART, PO2ART in the last 168 hours. Liver Enzymes Recent Labs  Lab 04/17/17 1838  04/19/17 0422 04/20/17 0349 04/21/17 0352  ALT 21  --   --   --   --   ALBUMIN  --    < > 1.7* 1.8* 1.9*   < > = values in this interval not displayed.   Cardiac Enzymes No results for input(s): TROPONINI, PROBNP in the last 168 hours.  Glucose Recent Labs  Lab 04/20/17 1302 04/20/17 1720 04/20/17 1911 04/21/17 0013 04/21/17 0324 04/21/17 0713  GLUCAP 152* 74 71 127* 175* 159*   Imaging  SIGNIFICANT EVENTS: 2/8: presented to ER following acute encephalopathy, witnessed sz with EMS and 2nd sz in ER, hyperglycemia, and HTN emergency 04/12/2017 started on Dilantin per neurology  04/13/2017 or sedated and now with right  hemiparesis for an MRI 2/14 underwent hemodialysis 2/15 and 2/16>> Tolerating CPAP/PS but not following commands   LINES/TUBES: 2-PIV ETT 2/8 >> OG tube 2/8 >> R IJ HD cath 2/13>>> 04/13/2017 Foley placed by urology secondary to urethral stricture  Cultures Blood 2/08>> No growth final  DISCUSSION: Seizures, HTN crisis now resolved. AKI improving with iHD.  No further HD planned for now.  Mental status may be continuing to improve but VERY slowly, will likely need trach/PEG unless significant improvement over next few days.   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 These appear to be watershed infarcts bilaterally. Negative for Hemorrhage. 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 2. Otherwise negative intracranial MRA. Plan Continue enteral Keppra and Vimpat Consider change to single agent depending on neurology input, question whether Vimpat may be contributing to his somnolence.  Neuro signed off again.  No evidence of ongoing seizures - will d/c vimpat as some ?contributing to somnolence and poor weaning.  Aspirin and Lipitor as ordered Nicardipine is off  Intubated for airway protection.  Ventilator dependent  Possible aspiration>> T Max 100.2>> WBC wnl -MS not supporting extubation -->hoping w/ HD we may be able to - Sedation off , no prn's given Plan  Vent support - 8cc/kg  F/u CXR  F/u ABG Continue attempts at PS wean - mental status is barrier.  Suspect if family wants to continue aggressive care he will need trach/peg unless he shows significant improvement in next few days.  Continue empiric abx for ?aspiration  HTN emergency; hx  HTN Plan Nicardipine off Continue labetalol, clonidine No further plans for HD as of now  Blood pressure goal systolic less than 458  AKI-on-CKD w/ hyperkalemia and NAG metabolic acidosis in setting of watershed  Stroke/ hypertensive emergency Urethral stricture -scr seems to have hit plateau  - Good UP after HD - Stabilizing BUN and Creatinine - Per renal will hold on additional HD for now.( Appreciate assist)  Plan Deferring hemodialysis, following for plateau and then improvement in his renal function.  His urine output has improved. Leave HD cath for now  Appreciate nephrology assistance   Fluid and electrolyte imbalance:   Hyperkalemia, hypernatremia and hyperchloremia  Plan Follow BMP Replace electrolytes as indicated Continue free water   Anemia:  HE IS [place] WiTNESS  Plan Follow CBC No transfusions  Nonketotic Hyperglycemia vs DKA ->still w/ hyperglycemia Plan Trend BMET Continue Lantus CBG Q 4 Sliding scale insulin per protocol  DVT prophylaxis: [place] heparin  SUP: ppi  Diet: tubefeed Activity: BR Disposition : ICU FAMILY  - Updates: No family at bedside.  Brother supposedly coming in today.  Need to discuss goals/prognosis.  Unclear what if any neurologic improvement he will continue to have.  Need to discuss one-way extubation v trach/peg.     - Inter-disciplinary family meet or Palliative Care meeting due by: 04/18/17   Nickolas Madrid, NP 04/21/2017  9:12 AM Pager: [phone] or [phone]

## 2017-04-21 NOTE — Clinical Social Work Note (Signed)
Clinical Social Work Assessment  Patient Details  Name: Richard Bennett MRN: [id number] Date of Birth: Jul 21, 1950  Date of referral:  04/21/17               Reason for consult:  Other (Comment Required)                Permission sought to share information with:  Family Supports Permission granted to share information::  Yes, Verbal Permission Granted  Name::     Richard Bennett [phone])  Agency::  family  Relationship::  brother   Contact Information:  Richard Bennett [phone]  Housing/Transportation Living arrangements for the past 2 months:  Single Family Home(alone.) Source of Information:  Siblings Patient Interpreter Needed:  None Criminal Activity/Legal Involvement Pertinent to Current Situation/Hospitalization:    Significant Relationships:  Siblings Lives with:  Self Do you feel safe going back to the place where you live?  Yes Need for family participation in patient care:  Yes (Comment)  Care giving concerns:  CSW spoke with pt's brother Richard Bennett for this assessment. CSW was informed that at this time pt's brother is concerned with who can make decisions for pt at this time since pt is unable to make decisions. CSW explained that if there are no other family members able to make the decision for pt then brother would be the one to assist in making decisions as needed. CSW was informed by MD that MD has already been speaking with pt's brother for decisions during this time.    Social Worker assessment / plan:  CSW spoke with pt's brother Richard Bennett via phone. CSW was informed that pt lives alone and only has support from brother as well as neighbor. Pt's brother reports that pt ha a son in [place], however unsure as to hoe their relationship is in making decisions for pt and care. CSW was also informed that pt's brother is interested in getting 22 and MPOA. CSW explained to brother that hospital only does HCPOA and not MPOA, and suggested that he reach out to an  outside attorney for assistance with this. Brother expressed understanding and wanted to know how he could get it in writing that he is able to make decision for pt. CSW expressed that brother may have to reach out to outside agencies for help with this at this time.   Employment status:  Unemployed Nurse, adult PT Recommendations:  Not assessed at this time Information / Referral to community resources:     Patient/Family's Response to care:  Pt's brother appeared to be understanding and agreeable to plan of care at this time. Richard Bennett expressed that he is waiting for pt's other brother to come in from [place] to aid in making further healthcare decisions.   Patient/Family's Understanding of and Emotional Response to Diagnosis, Current Treatment, and Prognosis:  No further questions or concerns have been presented to CSW at this time.  Emotional Assessment Appearance:  Appears stated age Attitude/Demeanor/Rapport:  Unable to Assess Affect (typically observed):  Unable to Assess Orientation:  (pt is intubated. ) Alcohol / Substance use:  Not Applicable Psych involvement (Current and /or in the community):  No (Comment)  Discharge Needs  Concerns to be addressed:  Denies Needs/Concerns at this time Readmission within the last 30 days:  No Current discharge risk:  Lack of support system Barriers to Discharge:  Continued Medical Work up   [employer], [place] 04/21/2017, 12:16 PM

## 2017-04-21 NOTE — Progress Notes (Signed)
Admit: 04/11/2017 LOS: 10  63M AoCKD (BL 1.5-1.9) req HD x1 2/14  Subjective:  BP controlled  Good UOP yesterday   02/17 0701 - 02/18 0700 In: 2598 [I.V.:220; NG/GT:2130; IV Piggyback:248] Out: 1610 [Urine:4000; Emesis/NG output:160]  Filed Weights   04/19/17 0317 04/20/17 0353 04/21/17 0242  Weight: 83.6 kg (184 lb 4.9 oz) 82.8 kg (182 lb 8.7 oz) 79.6 kg (175 lb 7.8 oz)    Scheduled Meds: . amLODipine  10 mg Per Tube Daily  . aspirin  325 mg Per Tube Daily  . atorvastatin  40 mg Per Tube q1800  . chlorhexidine gluconate (MEDLINE KIT)  15 mL Mouth Rinse BID  . Chlorhexidine Gluconate Cloth  6 each Topical Daily  . cloNIDine  0.2 mg Per Tube BID  . free water  200 mL Per Tube Q8H  . heparin  5,000 Units Subcutaneous Q8H  . insulin aspart  0-20 Units Subcutaneous Q4H  . insulin aspart  4 Units Subcutaneous Q4H  . insulin glargine  25 Units Subcutaneous Daily  . labetalol  200 mg Per Tube Q8H  . lacosamide  100 mg Per Tube BID  . levETIRAcetam  500 mg Per Tube BID  . mouth rinse  15 mL Mouth Rinse QID  . pantoprazole sodium  40 mg Per Tube Daily  . thiamine  100 mg Per Tube Daily   Continuous Infusions: . sodium chloride 10 mL/hr at 04/21/17 0800  . sodium chloride    . sodium chloride    . feeding supplement (VITAL AF 1.2 CAL) 1,000 mL (04/21/17 0800)  . furosemide Stopped (04/21/17 0337)   PRN Meds:.sodium chloride, sodium chloride, alteplase, bisacodyl, docusate, fentaNYL (SUBLIMAZE) injection, fentaNYL (SUBLIMAZE) injection, heparin, heparin, hydrALAZINE, labetalol, lidocaine (PF), lidocaine-prilocaine, midazolam, pentafluoroprop-tetrafluoroeth, sodium chloride flush  Current Labs: reviewed    Physical Exam:  Blood pressure 138/67, pulse 68, temperature 99.3 F (37.4 C), temperature source Oral, resp. rate 20, height _0  (1.727 m), weight 79.6 kg (175 lb 7.8 oz), SpO2 100 %. Not awake RRR No edema R IJ Temp HD cath c/d/i Coarse BS b/l ETT and OGT in  place  A 1. AoCKD3 (BL SCr 1.5-1.9); good UOP, K and HCO3 ok 2. B/l watershed CVA 3. Seizure on AEDs 4. HTN prev with emergency 5. Cocaine use 6. VDRF 7. Present HD  Catheter: R IJ TEMP 2/13 CCM 8. Anemia  P 1. No HD needed 2. Cont to follow closely; hopefully will plateau / start to improve BUN /SCr 3.    Pearson Grippe MD 04/21/2017, 9:22 AM  Recent Labs  Lab 04/19/17 0422 04/20/17 0349 04/21/17 0352  NA 141 140  140 138  K 3.9 3.5  3.5 3.7  CL 102 99*  100* 96*  CO2 _1 GLUCOSE 140* 96  97 190*  BUN 59* 69*  71* 80*  CREATININE 2.92* 3.11*  3.11* 3.28*  CALCIUM 8.1* 8.2*  8.2* 8.2*  PHOS 4.8* 5.2* 6.1*   Recent Labs  Lab 04/19/17 0422 04/20/17 0349 04/21/17 0352  WBC 6.3 5.5 6.3  HGB 7.4* 7.3* 7.6*  HCT 23.8* 22.8* 24.0*  MCV 84.7 82.9 83.3  PLT 199 209 227

## 2017-04-22 ENCOUNTER — Inpatient Hospital Stay (HOSPITAL_COMMUNITY): Payer: Medicare HMO

## 2017-04-22 DIAGNOSIS — I6330 Cerebral infarction due to thrombosis of unspecified cerebral artery: Secondary | ICD-10-CM

## 2017-04-22 LAB — RENAL FUNCTION PANEL
ALBUMIN: 2 g/dL — AB (ref 3.5–5.0)
Anion gap: 18 — ABNORMAL HIGH (ref 5–15)
BUN: 98 mg/dL — ABNORMAL HIGH (ref 6–20)
CALCIUM: 8.4 mg/dL — AB (ref 8.9–10.3)
CO2: 27 mmol/L (ref 22–32)
Chloride: 95 mmol/L — ABNORMAL LOW (ref 101–111)
Creatinine, Ser: 3.29 mg/dL — ABNORMAL HIGH (ref 0.61–1.24)
GFR calc Af Amer: 21 mL/min — ABNORMAL LOW (ref >60)
GFR, EST NON AFRICAN AMERICAN: 18 mL/min — AB (ref >60)
GLUCOSE: 204 mg/dL — AB (ref 65–99)
PHOSPHORUS: 5.8 mg/dL — AB (ref 2.5–4.6)
Potassium: 3.3 mmol/L — ABNORMAL LOW (ref 3.5–5.1)
SODIUM: 140 mmol/L (ref 135–145)

## 2017-04-22 LAB — CBC
HCT: 23.7 % — ABNORMAL LOW (ref 39.0–52.0)
Hemoglobin: 7.6 g/dL — ABNORMAL LOW (ref 13.0–17.0)
MCH: 26.8 pg (ref 26.0–34.0)
MCHC: 32.1 g/dL (ref 30.0–36.0)
MCV: 83.5 fL (ref 78.0–100.0)
PLATELETS: 242 10*3/uL (ref 150–400)
RBC: 2.84 MIL/uL — ABNORMAL LOW (ref 4.22–5.81)
RDW: 13.7 % (ref 11.5–15.5)
WBC: 7.5 10*3/uL (ref 4.0–10.5)

## 2017-04-22 LAB — GLUCOSE, CAPILLARY
GLUCOSE-CAPILLARY: 184 mg/dL — AB (ref 65–99)
GLUCOSE-CAPILLARY: 150 mg/dL — AB (ref 65–99)
GLUCOSE-CAPILLARY: 124 mg/dL — AB (ref 65–99)
Glucose-Capillary: 216 mg/dL — ABNORMAL HIGH (ref 65–99)
Glucose-Capillary: 134 mg/dL — ABNORMAL HIGH (ref 65–99)
Glucose-Capillary: 175 mg/dL — ABNORMAL HIGH (ref 65–99)

## 2017-04-22 MED ORDER — POTASSIUM CHLORIDE 20 MEQ/15ML (10%) PO SOLN
40.0000 meq | Freq: Two times a day (BID) | ORAL | Status: AC
  Administered 2017-04-22 (×2): 40 meq via ORAL
  Filled 2017-04-22 (×2): qty 30

## 2017-04-22 MED ORDER — FUROSEMIDE 10 MG/ML IJ SOLN
120.0000 mg | Freq: Two times a day (BID) | INTRAMUSCULAR | Status: DC
  Administered 2017-04-22 – 2017-04-23 (×2): 120 mg via INTRAVENOUS
  Filled 2017-04-22: qty 10
  Filled 2017-04-22 (×2): qty 12

## 2017-04-22 NOTE — Progress Notes (Signed)
Independently examined pt, evaluated data & formulated above care plan with NP/resident   67 year old diabetic, hypertensive with known CVA admitted 2/8 with acute encephalopathy and seizures in the setting of cocaine use, head CT showing bilateral watershed infarcts He developed AK I requiring dialysis and continues to require mechanical ventilation.  On exam-eyes open, but does not follow commands, blank stare, does track, decreased breath sounds bilateral, he has a good cough with mild secretions, S1-S2 normal.   Chest x-ray reviewed by me does not show any infiltrates, labs show creatinine stable at 3.3, mild hypokalemia and mild anemia.  Impression/plan  Acute respiratory failure-he is weaning somewhat on pressure support but clearly concern here is whether he will be able to protect his airway he does seem to have a moderate cough, will discuss further with family and decide on trial of extubation if he continues to improve versus tracheostomy.  AK I-required transient dialysis and hopeful for renal recovery here although creatinine has plateaued  Acute encephalopathy/seizures due to new bilateral watershed infarcts in the setting of cocaine use-continue Keppra and Vimpat, not sure if more neurological recovery will occur here  The patient is critically ill with multiple organ systems failure and requires high complexity decision making for assessment and support, frequent evaluation and titration of therapies, application of advanced monitoring technologies and extensive interpretation of multiple databases. Critical Care Time devoted to patient care services described in this note independent of APP/resident  time is 35 minutes.    Leanna Sato Elsworth Soho MD

## 2017-04-22 NOTE — Progress Notes (Signed)
   04/22/17 1100  Clinical Encounter Type  Visited With Patient  Visit Type Initial  Referral From Chaplain  Consult/Referral To Chaplain  Spiritual Encounters  Spiritual Needs Emotional  Stress Factors  Patient Stress Factors Exhausted  Family Stress Factors Exhausted    Pt was alone in his room. He was still intubated and seemingly a appeared awake. No family member on-site but charge nurse talked about a brother who occasionally calls. Chaplain provided emotional support and compassionate presence.  Alann Avey a Medical sales representative, [employer]

## 2017-04-22 NOTE — Progress Notes (Signed)
PULMONARY / CRITICAL CARE MEDICINE   Name: Richard Bennett MRN: [id number] DOB: October 17, 1950    ADMISSION DATE:  04/11/2017 CONSULTATION DATE: 04/11/17  REFERRING MD: ER MD  CHIEF COMPLAINT: Seizures, HTN emergency, Nonketotic hyperglycemia  HISTORY OF PRESENT ILLNESS:   35yoM with hx CVA, DM, HTN, presented to the ER w/ acute encephalopathy and seizures. Initially patient was wandering down hallway of apartment and police were called. EMS arrived to find patient in a bathtub initially unresponsive then combative. He had a seizure during this process and was given versed 5mg . Patient transported to [facility] ER where blood glucose 715 initially and patient with acute encephalopathy. Patient had a head lac that was sutured in the ER. He had a 2nd seizure in the ER. He continued to be altered combative following this seizure, repeatedly trying to climb out of bed. He received a total of 9mg  Versed and 4mg  Ativan while in the ER. He continued to be agitated and was intubated by ER staff for airway protection.   SUBJECTIVE:  No events overnight.  NO further HD plans for now per renal.  Mental status slowly improving.  Opens eyes to voice, no tracking, still not following commands.  -T Max 99.5 -1500cc last 24 - CXR>>Cardiomegaly. Lungs clear. No effusions, no edema   VITAL SIGNS: BP (!) 154/75   Pulse 68   Temp 98.8 F (37.1 C) (Oral)   Resp 18   Ht 5\' 8"  (1.727 m)   Wt 170 lb 10.2 oz (77.4 kg)   SpO2 100%   BMI 25.95 kg/m   VENTILATOR SETTINGS: Vent Mode: PRVC FiO2 (%):  [40 %] 40 % Set Rate:  [20 bmp] 20 bmp Vt Set:  [550 mL] 550 mL PEEP:  [5 cmH20] 5 cmH20 Plateau Pressure:  [16 cmH20-17 cmH20] 16 cmH20  INTAKE / OUTPUT:  Intake/Output Summary (Last 24 hours) at 04/22/2017 0835 Last data filed at 04/22/2017 0800 Gross per 24 hour  Intake 2604 ml  Output 4200 ml  Net -1596 ml     PHYSICAL EXAMINATION: General: chronically ill appearing male, NAD on vent , moving L side HEENT: ET  tube in place, pupils equal, oropharynx clear, OG tube Pulmonary: resps even non labored on vent, few crackles otherwise clear, decreased per bases Cardiac: s1s2 Regular, no murmur Abdomen soft, non-distended positive bowel sounds, Tolerating TF Extremities/musculoskeletal: anasarca R>L but improving  Neuro:  Opens eyes to name,no tracking 2/19, definitely responds to name, not following commands, moves L arm, WD L leg to pain, Right side flacid   LABS:  BMET Recent Labs  Lab 04/20/17 0349 04/21/17 0352 04/22/17 0434  NA 140  140 138 140  K 3.5  3.5 3.7 3.3*  CL 99*  100* 96* 95*  CO2 26  27 27 27   BUN 69*  71* 80* 98*  CREATININE 3.11*  3.11* 3.28* 3.29*  GLUCOSE 96  97 190* 204*   Electrolytes Recent Labs  Lab 04/20/17 0349 04/21/17 0352 04/22/17 0434  CALCIUM 8.2*  8.2* 8.2* 8.4*  PHOS 5.2* 6.1* 5.8*   CBC Recent Labs  Lab 04/20/17 0349 04/21/17 0352 04/22/17 0434  WBC 5.5 6.3 7.5  HGB 7.3* 7.6* 7.6*  HCT 22.8* 24.0* 23.7*  PLT 209 227 242   Coag's No results for input(s): APTT, INR in the last 168 hours. Sepsis Markers No results for input(s): LATICACIDVEN, PROCALCITON, O2SATVEN in the last 168 hours.  ABG No results for input(s): PHART, PCO2ART, PO2ART in the last 168 hours. Liver Enzymes  Recent Labs  Lab 04/17/17 1838  04/20/17 0349 04/21/17 0352 04/22/17 0434  ALT 21  --   --   --   --   ALBUMIN  --    < > 1.8* 1.9* 2.0*   < > = values in this interval not displayed.   Cardiac Enzymes No results for input(s): TROPONINI, PROBNP in the last 168 hours.  Glucose Recent Labs  Lab 04/21/17 1130 04/21/17 1516 04/21/17 2037 04/21/17 2348 04/22/17 0332 04/22/17 0738  GLUCAP 181* 182* 209* 205* 184* 216*   Imaging  SIGNIFICANT EVENTS: 2/8: presented to ER following acute encephalopathy, witnessed sz with EMS and 2nd sz in ER, hyperglycemia, and HTN emergency 04/12/2017 started on Dilantin per neurology  04/13/2017 or sedated and now  with right hemiparesis for an MRI 2/14 underwent hemodialysis 2/15 and 2/16>> Tolerating CPAP/PS but not following commands   LINES/TUBES: 2-PIV ETT 2/8 >> OG tube 2/8 >> R IJ HD cath 2/13>>> 04/13/2017 Foley placed by urology secondary to urethral stricture  Cultures Blood 2/08>> No growth final  DISCUSSION: Seizures, HTN crisis now resolved. AKI improving with iHD.  No further HD planned for now.  Mental status may be continuing to improve but VERY slowly, will likely need trach/PEG unless significant improvement over next few days.   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 These appear to be watershed infarcts bilaterally. Negative for Hemorrhage. 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 2. Otherwise negative intracranial MRA. Opens eyes to call of name, no tracking, no follow commands, Moves L arm without purpose, WD L leg to pain, R flacid  Plan Continue enteral Keppra  Aspirin and Lipitor as ordered Nicardipine is off Neuro checks per unit protocol Consider follow up imaging/ EEG  Intubated for airway protection.  Ventilator dependent  Possible aspiration>> T Max 100.2>> WBC wnl -MS not supporting extubation >> not following commands - Sedation off , no prn's given Plan  Vent support - 8cc/kg  F/u CXR  F/u ABG prn Continue  PS wean - mental status is barrier.  Suspect if family wants to continue aggressive care he will need trach/peg unless he shows significant improvement in next few days.  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   His urine output has improved. Leave HD cath for now  [place] nephrology assistance BMET daily Trend Creatinine/ BUN Follow UO   Fluid and electrolyte imbalance:   Hyperkalemia, hypernatremia and hyperchloremia  Plan Follow BMP Replace electrolytes as indicated Continue free water   Anemia: HE IS [place]EH[place] WiTNESS  Some oozing around HD cath  Plan Follow CBC Check Coags 2/19 No transfusions  Nonketotic Hyperglycemia vs DKA ->still w/ hyperglycemia Plan Trend BMET Continue Lantus CBG Q 4 Sliding scale insulin per protocol  DVT prophylaxis: [place] heparin  SUP: ppi  Diet: tubefeed Activity: BR Disposition : ICU FAMILY  - Updates: No family at bedside.  Note per social work regarding brother seeking HCPOA and MPOA. He is waiting for a brother to come from [place] to help make  Decisions re: goals of care. ? Son but unclear of his relationship to his Dad.  Unclear what if any neurologic improvement he will continue to have.  Need to discuss one-way extubation v trach/peg.  Family discussion re: goals of care needed. Day 11 ETT, needs trach or 1 way extubation.   - Inter-disciplinary family meet or Palliative Care meeting due by: 04/18/17   Magdalen Spatz, AGACNP-BC 04/22/2017  8:35 AM Pager:  [phone]

## 2017-04-22 NOTE — Progress Notes (Signed)
Admit: 04/11/2017 LOS: 11  84M AoCKD (BL 1.5-1.9) req HD x1 2/14  Subjective:  Creatinine stable at 3.3 this morning, BUN 98.  Potassium 3.3 and bicarbonate 27. 4.2 L urine output not on diuretics On goal enteral nutrition BP stable On q6h Lasix  02/18 0701 - 02/19 0700 In: 2604 [I.V.:210; NG/GT:2170; IV Piggyback:224] Out: 4200 [Urine:4200]  Filed Weights   04/20/17 0353 04/21/17 0242 04/22/17 0219  Weight: 82.8 kg (182 lb 8.7 oz) 79.6 kg (175 lb 7.8 oz) 77.4 kg (170 lb 10.2 oz)    Scheduled Meds: . amLODipine  10 mg Per Tube Daily  . aspirin  325 mg Per Tube Daily  . atorvastatin  40 mg Per Tube q1800  . chlorhexidine gluconate (MEDLINE KIT)  15 mL Mouth Rinse BID  . Chlorhexidine Gluconate Cloth  6 each Topical Daily  . cloNIDine  0.2 mg Per Tube BID  . free water  200 mL Per Tube Q8H  . heparin  5,000 Units Subcutaneous Q8H  . insulin aspart  0-20 Units Subcutaneous Q4H  . insulin aspart  4 Units Subcutaneous Q4H  . insulin glargine  25 Units Subcutaneous Daily  . labetalol  200 mg Per Tube Q8H  . levETIRAcetam  500 mg Per Tube BID  . mouth rinse  15 mL Mouth Rinse QID  . pantoprazole sodium  40 mg Per Tube Daily  . thiamine  100 mg Per Tube Daily   Continuous Infusions: . sodium chloride 10 mL/hr at 04/22/17 0600  . sodium chloride    . sodium chloride    . feeding supplement (VITAL AF 1.2 CAL) 1,000 mL (04/22/17 0600)  . furosemide Stopped (04/22/17 0331)   PRN Meds:.sodium chloride, sodium chloride, alteplase, bisacodyl, docusate, fentaNYL (SUBLIMAZE) injection, fentaNYL (SUBLIMAZE) injection, heparin, heparin, hydrALAZINE, labetalol, lidocaine (PF), lidocaine-prilocaine, midazolam, pentafluoroprop-tetrafluoroeth, sodium chloride flush  Current Labs: reviewed    Physical Exam:  Blood pressure (!) 154/75, pulse 68, temperature 98.8 F (37.1 C), temperature source Oral, resp. rate 18, height 5' 8"  (1.727 m), weight 77.4 kg (170 lb 10.2 oz), SpO2 100 %. Not  awake RRR No edema R IJ Temp HD cath c/d/i Coarse BS b/l ETT and OGT in place  A 1. AoCKD3 (BL SCr 1.5-1.9); good UOP, K and HCO3 ok 2. B/l watershed CVA persistent encephalopathy 3. Seizure on AEDs 4. HTN prev with emergency 5. Cocaine use 6. VDRF 7. Present HD  Catheter: R IJ TEMP 2/13 CCM 8. Anemia  P 1. No HD needed, I hope will have some future recovery  2. Cont to follow closely 3. Reduce lasix to BID 4. Daily weights, Daily Renal Panel, Strict I/Os, Avoid nephrotoxins (NSAIDs, judicious IV Contrast)   Pearson Grippe MD 04/22/2017, 8:43 AM  Recent Labs  Lab 04/20/17 0349 04/21/17 0352 04/22/17 0434  NA 140  140 138 140  K 3.5  3.5 3.7 3.3*  CL 99*  100* 96* 95*  CO2 26  27 27 27   GLUCOSE 96  97 190* 204*  BUN 69*  71* 80* 98*  CREATININE 3.11*  3.11* 3.28* 3.29*  CALCIUM 8.2*  8.2* 8.2* 8.4*  PHOS 5.2* 6.1* 5.8*   Recent Labs  Lab 04/20/17 0349 04/21/17 0352 04/22/17 0434  WBC 5.5 6.3 7.5  HGB 7.3* 7.6* 7.6*  HCT 22.8* 24.0* 23.7*  MCV 82.9 83.3 83.5  PLT 209 227 242

## 2017-04-23 ENCOUNTER — Inpatient Hospital Stay (HOSPITAL_COMMUNITY): Payer: Medicare HMO

## 2017-04-23 LAB — MAGNESIUM: Magnesium: 2.3 mg/dL (ref 1.7–2.4)

## 2017-04-23 LAB — GLUCOSE, CAPILLARY
GLUCOSE-CAPILLARY: 164 mg/dL — AB (ref 65–99)
GLUCOSE-CAPILLARY: 130 mg/dL — AB (ref 65–99)
GLUCOSE-CAPILLARY: 141 mg/dL — AB (ref 65–99)
Glucose-Capillary: 164 mg/dL — ABNORMAL HIGH (ref 65–99)
Glucose-Capillary: 133 mg/dL — ABNORMAL HIGH (ref 65–99)

## 2017-04-23 LAB — RENAL FUNCTION PANEL
Albumin: 2.1 g/dL — ABNORMAL LOW (ref 3.5–5.0)
Anion gap: 15 (ref 5–15)
BUN: 106 mg/dL — AB (ref 6–20)
CHLORIDE: 97 mmol/L — AB (ref 101–111)
CO2: 28 mmol/L (ref 22–32)
Calcium: 8.5 mg/dL — ABNORMAL LOW (ref 8.9–10.3)
Creatinine, Ser: 3.31 mg/dL — ABNORMAL HIGH (ref 0.61–1.24)
GFR calc Af Amer: 21 mL/min — ABNORMAL LOW (ref >60)
GFR, EST NON AFRICAN AMERICAN: 18 mL/min — AB (ref >60)
GLUCOSE: 197 mg/dL — AB (ref 65–99)
POTASSIUM: 4.1 mmol/L (ref 3.5–5.1)
Phosphorus: 5.1 mg/dL — ABNORMAL HIGH (ref 2.5–4.6)
Sodium: 140 mmol/L (ref 135–145)

## 2017-04-23 LAB — CBC
HEMATOCRIT: 24 % — AB (ref 39.0–52.0)
Hemoglobin: 7.5 g/dL — ABNORMAL LOW (ref 13.0–17.0)
MCH: 26.2 pg (ref 26.0–34.0)
MCHC: 31.3 g/dL (ref 30.0–36.0)
MCV: 83.9 fL (ref 78.0–100.0)
PLATELETS: 248 10*3/uL (ref 150–400)
RBC: 2.86 MIL/uL — ABNORMAL LOW (ref 4.22–5.81)
RDW: 13.7 % (ref 11.5–15.5)
WBC: 10 10*3/uL (ref 4.0–10.5)

## 2017-04-23 LAB — PROTIME-INR
INR: 1.02
Prothrombin Time: 13.3 seconds (ref 11.4–15.2)

## 2017-04-23 MED ORDER — FUROSEMIDE 10 MG/ML IJ SOLN
120.0000 mg | Freq: Every day | INTRAVENOUS | Status: DC
  Administered 2017-04-24 – 2017-04-25 (×2): 120 mg via INTRAVENOUS
  Filled 2017-04-23 (×2): qty 10

## 2017-04-23 NOTE — Progress Notes (Signed)
PULMONARY / CRITICAL CARE MEDICINE   Name: Richard Bennett MRN: [id number] DOB: Jun 07, 1950    ADMISSION DATE:  04/11/2017 CONSULTATION DATE: 04/11/17  REFERRING MD: ER MD  CHIEF COMPLAINT: Seizures, HTN emergency, Nonketotic hyperglycemia  HISTORY OF PRESENT ILLNESS:   89yoM with hx CVA, DM, HTN, presented to the ER w/ acute encephalopathy and seizures. Initially patient was wandering down hallway of apartment and police were called. EMS arrived to find patient in a bathtub initially unresponsive then combative. He had a seizure during this process and was given versed 5mg . Patient transported to [facility] ER where blood glucose 715 initially and patient with acute encephalopathy. Patient had a head lac that was sutured in the ER. He had a 2nd seizure in the ER. He continued to be altered combative following this seizure, repeatedly trying to climb out of bed. He received a total of 9mg  Versed and 4mg  Ativan while in the ER. He continued to be agitated and was intubated by ER staff for airway protection.   SUBJECTIVE:  No events overnight.  NO further HD plans for now per renal.  Family updated yesterday and discussing goals of care amongst themsevles Mental status appears similar to prior days per nursing report - will occasionally move his LLE and withdraw to pain there spontaneously but does not follow any commands    VITAL SIGNS: BP (!) 159/79   Pulse 73   Temp (!) 100.4 F (38 C) (Oral) Comment: RN Phyllis aware   Resp 20   Ht 5\' 8"  (1.727 m)   Wt 161 lb 6 oz (73.2 kg)   SpO2 100%   BMI 24.54 kg/m   VENTILATOR SETTINGS: Vent Mode: PRVC FiO2 (%):  [40 %] 40 % Set Rate:  [20 bmp] 20 bmp Vt Set:  [550 mL] 550 mL PEEP:  [5 cmH20] 5 cmH20 Pressure Support:  [10 cmH20] 10 cmH20 Plateau Pressure:  [16 cmH20-18 cmH20] 16 cmH20  INTAKE / OUTPUT:  Intake/Output Summary (Last 24 hours) at 04/23/2017 0846 Last data filed at 04/23/2017 0800 Gross per 24 hour  Intake 2230 ml  Output 2940 ml   Net -710 ml     PHYSICAL EXAMINATION: General: chronically ill appearing male, NAD on vent , moving L side HEENT: ET tube in place, pupils equal, oropharynx clear, OG tube Pulmonary: resps even non labored on vent, rhonchi anteriorly  Cardiac: s1s2 Regular, no murmur Abdomen soft, non-distended positive bowel sounds, Tolerating TF Extremities/musculoskeletal: anasarca R>L Neuro:  Opens eyes to name,no tracking but will inconsistently look to verbal stimuli, not following commands, not withdrawing to pain in all 4 extremities  LABS:  BMET Recent Labs  Lab 04/21/17 0352 04/22/17 0434 04/23/17 0448  NA 138 140 140  K 3.7 3.3* 4.1  CL 96* 95* 97*  CO2 27 27 28   BUN 80* 98* 106*  CREATININE 3.28* 3.29* 3.31*  GLUCOSE 190* 204* 197*   Electrolytes Recent Labs  Lab 04/21/17 0352 04/22/17 0434 04/23/17 0448  CALCIUM 8.2* 8.4* 8.5*  MG  --   --  2.3  PHOS 6.1* 5.8* 5.1*   CBC Recent Labs  Lab 04/21/17 0352 04/22/17 0434 04/23/17 0448  WBC 6.3 7.5 10.0  HGB 7.6* 7.6* 7.5*  HCT 24.0* 23.7* 24.0*  PLT 227 242 248   Coag's Recent Labs  Lab 04/23/17 0448  INR 1.02   Sepsis Markers No results for input(s): LATICACIDVEN, PROCALCITON, O2SATVEN in the last 168 hours.  ABG No results for input(s): PHART, PCO2ART, PO2ART in the  last 168 hours. Liver Enzymes Recent Labs  Lab 04/17/17 1838  04/21/17 0352 04/22/17 0434 04/23/17 0448  ALT 21  --   --   --   --   ALBUMIN  --    < > 1.9* 2.0* 2.1*   < > = values in this interval not displayed.   Cardiac Enzymes No results for input(s): TROPONINI, PROBNP in the last 168 hours.  Glucose Recent Labs  Lab 04/22/17 1132 04/22/17 1517 04/22/17 2039 04/22/17 2333 04/23/17 0421 04/23/17 0712  GLUCAP 150* 124* 134* 175* 164* 164*   Imaging  SIGNIFICANT EVENTS: 2/8: presented to ER following acute encephalopathy, witnessed sz with EMS and 2nd sz in ER, hyperglycemia, and HTN emergency 04/12/2017 started on  Dilantin per neurology  04/13/2017 or sedated and now with right hemiparesis for an MRI 2/14 underwent hemodialysis 2/15 and 2/16>> Tolerating CPAP/PS but not following commands 2/20>> had to be placed back on vent support, still not following commands  LINES/TUBES: 2-PIV ETT 2/8 >> OG tube 2/8 >> R IJ HD cath 2/13>>> 04/13/2017 Foley placed by urology secondary to urethral stricture  Cultures Blood 2/08>> No growth final  DISCUSSION: Seizures, HTN crisis now resolved. AKI improving with iHD.  No further HD planned for now.  Mental status may be continuing to improve but VERY slowly, will likely need trach/PEG unless significant improvement over next few days.   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 These appear to be watershed infarcts bilaterally. Negative for Hemorrhage. 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 2. Otherwise negative intracranial MRA. Opens eyes to call of name, no tracking, no follow commands, Moves L arm without purpose, WD L leg to pain, R flacid  Plan Continue enteral Keppra  Aspirin and Lipitor as ordered Nicardipine is off Neuro checks per unit protocol Consider follow up imaging/ EEG  Intubated for airway protection.  Ventilator dependent  Possible aspiration>> T Max 100.2>> WBC wnl -MS not supporting extubation >> not following commands - Sedation off , no prn's given Plan  Vent support - 8cc/kg  F/u CXR - clear 2/20 F/u ABG prn Continue  PS wean - mental status is barrier and does not seem to be improving Suspect if family wants to continue aggressive care he will need trach/peg unless he shows significant improvement in  next few days. Family reportedly discussing amongst themselves and planning to get back to [place] 2/21 Antibiotics for presumed aspiration stopped 2/14  HTN emergency; hx HTN Plan Nicardipine off Continue labetalol, and prior home regimen of clonidine, norvasc  Prn apresoline No further plans for HD as of now  Blood pressure goal systolic less than 694  AKI-on-CKD w/ hyperkalemia and NAG metabolic acidosis in setting of watershed  Stroke/ hypertensive emergency Urethral stricture -scr seems to have hit plateau  - Good UP after HD x 1 -  BUN and Creatinine slowly rising however mental status not worsening with good urine output. Will address goals of care again before asking renal to reconsider dialysis though hopefully this will not be needed - Per renal will hold on additional HD for now.( Appreciate assist) - lasix decreased to daily 2/20 by Renal  Plan Deferring hemodialysis, following for plateau and then improvement in his renal function.   His urine output has improved. Leave HD cath for now  Appreciate nephrology assistance Trend Creatinine/ BUN Follow UO   Fluid and electrolyte imbalance:   Hyperkalemia, hypernatremia and hyperchloremia  Plan Follow BMP Replace electrolytes as indicated Stopped free water 2/20  Anemia: HE IS JEHOVAH WiTNESS  Some oozing around HD cath Plan Follow CBC Check Coags 2/19 No transfusions  Nonketotic Hyperglycemia vs DKA ->still w/ hyperglycemia Plan Trend BMET Continue Lantus CBG Q 4 Sliding scale insulin per protocol  DVT prophylaxis: [place] heparin  SUP: ppi  Diet: tubefeed Activity: BR Disposition : ICU FAMILY  - Updates: No family at bedside.  Note per social work regarding brother seeking HCPOA and MPOA. He is waiting for a brother to come from [place] to help make  Decisions re: goals of care. ? Son but unclear of his relationship to his Dad.  Unclear what if any neurologic improvement he will continue to have.  Need to  discuss one-way extubation v trach/peg.  Family discussion re: goals of care needed. prolonged ETT, needs trach or 1 way extubation, awaiting family decision as above   - Inter-disciplinary family meet or Palliative Care meeting due by: 04/18/17   Mali Caleb Prigmore, MD [facility] Pulmonology/Critical Care Pager [phone] After hours pager: [phone]

## 2017-04-23 NOTE — Progress Notes (Signed)
Admit: 04/11/2017 LOS: 12  33M AoCKD (BL 1.5-1.9) req HD x1 2/14  Subjective:  Creatinine remained stable at 3.3, BUN 106, potassium 4.1, bicarbonate 28 this morning 3.1 L urine output yesterday, net -0.9 L BP stable Remains on ventilator Weights are down approximately 30 pounds compared to a week ago and similar to admission weights  02/19 0701 - 02/20 0700 In: 2230 [I.V.:220; NG/GT:1960; IV Piggyback:50] Out: 3090 [Urine:3090]  Filed Weights   04/21/17 0242 04/22/17 0219 04/23/17 0500  Weight: 79.6 kg (175 lb 7.8 oz) 77.4 kg (170 lb 10.2 oz) 73.2 kg (161 lb 6 oz)    Scheduled Meds: . amLODipine  10 mg Per Tube Daily  . aspirin  325 mg Per Tube Daily  . atorvastatin  40 mg Per Tube q1800  . chlorhexidine gluconate (MEDLINE KIT)  15 mL Mouth Rinse BID  . Chlorhexidine Gluconate Cloth  6 each Topical Daily  . cloNIDine  0.2 mg Per Tube BID  . free water  200 mL Per Tube Q8H  . heparin  5,000 Units Subcutaneous Q8H  . insulin aspart  0-20 Units Subcutaneous Q4H  . insulin aspart  4 Units Subcutaneous Q4H  . insulin glargine  25 Units Subcutaneous Daily  . labetalol  200 mg Per Tube Q8H  . levETIRAcetam  500 mg Per Tube BID  . mouth rinse  15 mL Mouth Rinse QID  . pantoprazole sodium  40 mg Per Tube Daily  . thiamine  100 mg Per Tube Daily   Continuous Infusions: . sodium chloride 10 mL/hr at 04/22/17 0600  . sodium chloride    . sodium chloride    . feeding supplement (VITAL AF 1.2 CAL) 1,000 mL (04/22/17 1723)  . furosemide Stopped (04/23/17 0405)   PRN Meds:.sodium chloride, sodium chloride, alteplase, bisacodyl, docusate, fentaNYL (SUBLIMAZE) injection, fentaNYL (SUBLIMAZE) injection, heparin, heparin, hydrALAZINE, labetalol, lidocaine (PF), lidocaine-prilocaine, midazolam, pentafluoroprop-tetrafluoroeth, sodium chloride flush  Current Labs: reviewed    Physical Exam:  Blood pressure (!) 159/79, pulse 73, temperature (!) 100.4 F (38 C), temperature source Oral,  resp. rate 20, height 5' 8"  (1.727 m), weight 73.2 kg (161 lb 6 oz), SpO2 100 %. Not awake RRR No edema R IJ Temp HD cath c/d/i Coarse BS b/l ETT and OGT in place  A 1. AoCKD3 (BL SCr 1.5-1.9); good UOP, K and HCO3 ok 2. B/l watershed CVA persistent encephalopathy 3. Seizure on AEDs 4. HTN prev with emergency 5. Cocaine use 6. VDRF 7. Present HD  Catheter: R IJ TEMP 2/13 CCM 8. Anemia  P 1. No HD needed, I hope will have some future recovery  2. Cont to follow closely 3. Lasix to once daily for now; euvolemic 4. Daily weights, Daily Renal Panel, Strict I/Os, Avoid nephrotoxins (NSAIDs, judicious IV Contrast)   Pearson Grippe MD 04/23/2017, 8:35 AM  Recent Labs  Lab 04/21/17 0352 04/22/17 0434 04/23/17 0448  NA 138 140 140  K 3.7 3.3* 4.1  CL 96* 95* 97*  CO2 27 27 28   GLUCOSE 190* 204* 197*  BUN 80* 98* 106*  CREATININE 3.28* 3.29* 3.31*  CALCIUM 8.2* 8.4* 8.5*  PHOS 6.1* 5.8* 5.1*   Recent Labs  Lab 04/21/17 0352 04/22/17 0434 04/23/17 0448  WBC 6.3 7.5 10.0  HGB 7.6* 7.6* 7.5*  HCT 24.0* 23.7* 24.0*  MCV 83.3 83.5 83.9  PLT 227 242 248

## 2017-04-24 DIAGNOSIS — N183 Chronic kidney disease, stage 3 (moderate): Secondary | ICD-10-CM

## 2017-04-24 LAB — GLUCOSE, CAPILLARY
GLUCOSE-CAPILLARY: 158 mg/dL — AB (ref 65–99)
GLUCOSE-CAPILLARY: 186 mg/dL — AB (ref 65–99)
Glucose-Capillary: 121 mg/dL — ABNORMAL HIGH (ref 65–99)
Glucose-Capillary: 127 mg/dL — ABNORMAL HIGH (ref 65–99)
Glucose-Capillary: 163 mg/dL — ABNORMAL HIGH (ref 65–99)

## 2017-04-24 LAB — RENAL FUNCTION PANEL
ALBUMIN: 2 g/dL — AB (ref 3.5–5.0)
Anion gap: 15 (ref 5–15)
BUN: 106 mg/dL — AB (ref 6–20)
CHLORIDE: 98 mmol/L — AB (ref 101–111)
CO2: 27 mmol/L (ref 22–32)
Calcium: 8.5 mg/dL — ABNORMAL LOW (ref 8.9–10.3)
Creatinine, Ser: 3.31 mg/dL — ABNORMAL HIGH (ref 0.61–1.24)
GFR calc Af Amer: 21 mL/min — ABNORMAL LOW (ref >60)
GFR calc non Af Amer: 18 mL/min — ABNORMAL LOW (ref >60)
GLUCOSE: 146 mg/dL — AB (ref 65–99)
PHOSPHORUS: 5.2 mg/dL — AB (ref 2.5–4.6)
POTASSIUM: 3.6 mmol/L (ref 3.5–5.1)
Sodium: 140 mmol/L (ref 135–145)

## 2017-04-24 MED ORDER — VITAL AF 1.2 CAL PO LIQD
1000.0000 mL | ORAL | Status: DC
  Administered 2017-04-24: 1000 mL
  Filled 2017-04-24: qty 1000

## 2017-04-24 MED ORDER — POTASSIUM CHLORIDE 20 MEQ/15ML (10%) PO SOLN
40.0000 meq | Freq: Once | ORAL | Status: AC
  Administered 2017-04-24: 40 meq via ORAL
  Filled 2017-04-24: qty 30

## 2017-04-24 MED ORDER — INSULIN GLARGINE 100 UNIT/ML ~~LOC~~ SOLN
30.0000 [IU] | Freq: Every day | SUBCUTANEOUS | Status: DC
  Administered 2017-04-25 – 2017-04-27 (×3): 30 [IU] via SUBCUTANEOUS
  Filled 2017-04-24 (×4): qty 0.3

## 2017-04-24 NOTE — Care Management Note (Signed)
Case Management Note  Patient Details  Name: Richard Bennett MRN: [id number] Date of Birth: Apr 08, 1950  Subjective/Objective:   Pt admitted with acute enceph and seizures                 Action/Plan:  Pt is from home.  Pt is on the ventilator and now requiring CRRT    Expected Discharge Date:                  Expected Discharge Plan:  Home/Self Care  In-House Referral:     Discharge planning Services  CM Consult  Post Acute Care Choice:    Choice offered to:     DME Arranged:    DME Agency:     HH Arranged:    HH Agency:     Status of Service:     If discussed at [employer], dates discussed:    Additional Comments: 04/24/2017  Pt remains on ventilator - does not follow commands.  [place] discussions ongoing - family will likely want trach if needs reintubation   04/18/17 CRRT discontinued, pt has now received 1 HD session Maryclare Labrador, RN 04/24/2017, 5:00 PM

## 2017-04-24 NOTE — Progress Notes (Signed)
Nutrition Follow-up  DOCUMENTATION CODES:   Not applicable  INTERVENTION:    Continue Vital AF 1.2 via OGT, decrease rate to 60 ml/h (1440 ml/day) to provide 1728 kcals, 108 gm protein, 1168 ml free water daily.  NUTRITION DIAGNOSIS:   Inadequate oral intake related to inability to eat as evidenced by NPO status.  Ongoing  GOAL:   Patient will meet greater than or equal to 90% of their needs  Met with TF  MONITOR:   Vent status, Labs, I & O's  ASSESSMENT:   67 yo male with PMH of HTN, HLD, Jehovah's witness, DM-2, stroke who was admitted on 2/8 with seizures, HTN emergency, nonketotic hyperglycemia. Required intubation on admission.  Discussed patient in ICU rounds and with RN today. Plans to stop HD; HD cath to be removed tomorrow.  Tolerating TF well at goal rate. Currently receiving Vital AF 1.2 via OGT at 70 ml/h (1680 ml/day) to provide 2016 kcals, 126 gm protein, 1362 ml free water daily.   Patient is currently intubated on ventilator support MV: 10.5 L/min Temp (24hrs), Avg:99 F (37.2 C), Min:98.3 F (36.8 C), Max:99.5 F (37.5 C)  Labs reviewed. Phosphorus 5.2 (H) CBG's: [phone] Medications reviewed and include thiamine.  Diet Order:  Diet NPO time specified Seizure precautions  EDUCATION NEEDS:   No education needs have been identified at this time  Skin:  Skin Assessment: Skin Integrity Issues: Skin Integrity Issues:: Other (Comment) Other: laceration to head  Last BM:  2/12  Height:   Ht Readings from Last 1 Encounters:  04/11/17 5' 8"  (1.727 m)    Weight:   Wt Readings from Last 1 Encounters:  04/24/17 160 lb 0.9 oz (72.6 kg)    Ideal Body Weight:  70 kg  BMI:  Body mass index is 24.34 kg/m.  Estimated Nutritional Needs:   Kcal:  1800  Protein:  100-115 gm  Fluid:  1.8 L   Molli Barrows, RD, LDN, [place] Pager [phone] After Hours Pager [phone]

## 2017-04-24 NOTE — Progress Notes (Signed)
Admit: 04/11/2017 LOS: 13  Richard Bennett AoCKD (BL 1.5-1.9) req HD x1 2/14  Subjective:  Morning labs: Creatinine stable at 3.3, BUN 106, potassium 3.6, bicarbonate 27 1.3 L urine output yesterday BP stable Remains on ventilator Weights are down approximately 30 pounds compared to a week ago and similar to admission weights  02/20 0701 - 02/21 0700 In: 6629 [I.V.:230; NG/GT:1500] Out: 1290 [Urine:1290]  Filed Weights   04/22/17 0219 04/23/17 0500 04/24/17 0500  Weight: 77.4 kg (170 lb 10.2 oz) 73.2 kg (161 lb 6 oz) 72.6 kg (160 lb 0.9 oz)    Scheduled Meds: . amLODipine  10 mg Per Tube Daily  . aspirin  325 mg Per Tube Daily  . atorvastatin  40 mg Per Tube q1800  . chlorhexidine gluconate (MEDLINE KIT)  15 mL Mouth Rinse BID  . Chlorhexidine Gluconate Cloth  6 each Topical Daily  . cloNIDine  0.2 mg Per Tube BID  . heparin  5,000 Units Subcutaneous Q8H  . insulin aspart  0-20 Units Subcutaneous Q4H  . insulin aspart  4 Units Subcutaneous Q4H  . insulin glargine  25 Units Subcutaneous Daily  . labetalol  200 mg Per Tube Q8H  . levETIRAcetam  500 mg Per Tube BID  . mouth rinse  15 mL Mouth Rinse QID  . pantoprazole sodium  40 mg Per Tube Daily  . thiamine  100 mg Per Tube Daily   Continuous Infusions: . sodium chloride 10 mL/hr at 04/24/17 0400  . sodium chloride    . sodium chloride    . feeding supplement (VITAL AF 1.2 CAL) 1,000 mL (04/24/17 0400)  . furosemide     PRN Meds:.sodium chloride, sodium chloride, alteplase, bisacodyl, docusate, fentaNYL (SUBLIMAZE) injection, fentaNYL (SUBLIMAZE) injection, heparin, heparin, hydrALAZINE, labetalol, lidocaine (PF), lidocaine-prilocaine, midazolam, pentafluoroprop-tetrafluoroeth, sodium chloride flush  Current Labs: reviewed    Physical Exam:  Blood pressure (!) 153/74, pulse (!) 38, temperature 99 F (37.2 C), temperature source Oral, resp. rate (!) 24, height 5' 8"  (1.727 m), weight 72.6 kg (160 lb 0.9 oz), SpO2 100 %. Not  awake RRR No edema R IJ Temp HD cath c/d/i Coarse BS b/l ETT and OGT in place  A 1. AoCKD3 (BL SCr 1.5-1.9); good UOP, K and HCO3 ok 2. B/l watershed CVA persistent encephalopathy 3. Seizure on AEDs 4. HTN prev with emergency 5. Cocaine use 6. VDRF 7. Present HD Catheter: R IJ TEMP 2/13 CCM 8. Anemia  P 1. No HD needed, I hope will have some future recovery but mght be at new baseline 2. Cont to follow closely 3. Cont Lasix once daily for now; euvolemic 4. If remains stable tomorrow will remove HD cath 5. Daily weights, Daily Renal Panel, Strict I/Os, Avoid nephrotoxins (NSAIDs, judicious IV Contrast)   Pearson Grippe MD 04/24/2017, 9:03 AM  Recent Labs  Lab 04/22/17 0434 04/23/17 0448 04/24/17 0511  NA 140 140 140  K 3.3* 4.1 3.6  CL 95* 97* 98*  CO2 27 28 27   GLUCOSE 204* 197* 146*  BUN 98* 106* 106*  CREATININE 3.29* 3.31* 3.31*  CALCIUM 8.4* 8.5* 8.5*  PHOS 5.8* 5.1* 5.2*   Recent Labs  Lab 04/21/17 0352 04/22/17 0434 04/23/17 0448  WBC 6.3 7.5 10.0  HGB 7.6* 7.6* 7.5*  HCT 24.0* 23.7* 24.0*  MCV 83.3 83.5 83.9  PLT 227 242 248

## 2017-04-24 NOTE — Progress Notes (Signed)
  67 year old diabetic, hypertensive with known CVA admitted 2/8 with acute encephalopathy and seizures in the setting of cocaine use, head CT showing bilateral watershed infarcts He developed AK I requiring dialysis and continues to require mechanical ventilation.   On exam-opens eyes to name and tracks but still does not follow commands, more spontaneous movements of left upper extremity and left toes but does not move the right side, clear breath sounds bilateral, S1-S2 normal.  Chest x-ray from 2/20 was reviewed which appears clear. Labs show creatinine has plateaued at 3.3.  Impression/plan  Acute respiratory failure-he had.  Self apnea on pressure support weaning him to perform ATP screening trial and he seemed to do well for 30 minutes spontaneous breathing, his cough remains questionable  Acute encephalopathy/seizures due to new bilateral watershed infarcts in the setting of cocaine use-he has shown mild neurological improvement, hence reasonable to push forward, continue Keppra and Vimpat.  AKI-required transient dialysis but creatinine has plateaued now, continue Lasix once daily, remove HD cath if creatinine remains stable by tomorrow.  Goals of care discussed with brother Ruthann Cancer was discussed with his other brother many, his son in [place] is somewhat estranged and they have kept him abreast of developments.  They do desire reintubation should he fail extubation and tracheostomy if needed given that he has had some neurological improvement  The patient is critically ill with multiple organ systems failure and requires high complexity decision making for assessment and support, frequent evaluation and titration of therapies, application of advanced monitoring technologies and extensive interpretation of multiple databases. Critical Care Time devoted to patient care services described in this note independent of APP/resident  time is 35 minutes.    Leanna Sato Elsworth Soho MD

## 2017-04-25 ENCOUNTER — Inpatient Hospital Stay (HOSPITAL_COMMUNITY): Payer: Medicare HMO

## 2017-04-25 LAB — GLUCOSE, CAPILLARY
GLUCOSE-CAPILLARY: 154 mg/dL — AB (ref 65–99)
GLUCOSE-CAPILLARY: 173 mg/dL — AB (ref 65–99)
Glucose-Capillary: 179 mg/dL — ABNORMAL HIGH (ref 65–99)
Glucose-Capillary: 96 mg/dL (ref 65–99)
Glucose-Capillary: 67 mg/dL (ref 65–99)

## 2017-04-25 LAB — CBC
HCT: 24 % — ABNORMAL LOW (ref 39.0–52.0)
Hemoglobin: 7.4 g/dL — ABNORMAL LOW (ref 13.0–17.0)
MCH: 26.2 pg (ref 26.0–34.0)
MCHC: 30.8 g/dL (ref 30.0–36.0)
MCV: 85.1 fL (ref 78.0–100.0)
PLATELETS: 257 10*3/uL (ref 150–400)
RBC: 2.82 MIL/uL — AB (ref 4.22–5.81)
RDW: 13.9 % (ref 11.5–15.5)
WBC: 7.9 10*3/uL (ref 4.0–10.5)

## 2017-04-25 LAB — RENAL FUNCTION PANEL
ALBUMIN: 2.1 g/dL — AB (ref 3.5–5.0)
Anion gap: 15 (ref 5–15)
BUN: 113 mg/dL — ABNORMAL HIGH (ref 6–20)
CALCIUM: 8.5 mg/dL — AB (ref 8.9–10.3)
CO2: 26 mmol/L (ref 22–32)
Chloride: 100 mmol/L — ABNORMAL LOW (ref 101–111)
Creatinine, Ser: 3.25 mg/dL — ABNORMAL HIGH (ref 0.61–1.24)
GFR calc Af Amer: 21 mL/min — ABNORMAL LOW (ref >60)
GFR, EST NON AFRICAN AMERICAN: 18 mL/min — AB (ref >60)
Glucose, Bld: 215 mg/dL — ABNORMAL HIGH (ref 65–99)
Phosphorus: 5.2 mg/dL — ABNORMAL HIGH (ref 2.5–4.6)
Potassium: 4.2 mmol/L (ref 3.5–5.1)
SODIUM: 141 mmol/L (ref 135–145)

## 2017-04-25 LAB — MAGNESIUM: MAGNESIUM: 2.6 mg/dL — AB (ref 1.7–2.4)

## 2017-04-25 MED ORDER — VITAL AF 1.2 CAL PO LIQD
1000.0000 mL | ORAL | Status: DC
  Administered 2017-04-25 – 2017-04-27 (×3): 1000 mL
  Filled 2017-04-25 (×5): qty 1000

## 2017-04-25 MED ORDER — DEXTROSE 50 % IV SOLN
INTRAVENOUS | Status: AC
  Administered 2017-04-25: 50 mL
  Filled 2017-04-25: qty 50

## 2017-04-25 NOTE — Progress Notes (Signed)
Admit: 04/11/2017 LOS: 14  33M AoCKD (BL 1.5-1.9) req HD x1 2/14  Subjective:  Morning labs: Creatinine stable at 3.25, BUN 113, potassium 4.2, bicarbonate 26 2 L urine output yesterday Remains on lasix 120 IV qAM; stable weights  02/21 0701 - 02/22 0700 In: 1718 [I.V.:230; NG/GT:1426; IV Piggyback:62] Out: 2125 [Urine:1975; Emesis/NG output:150]  Filed Weights   04/23/17 0500 04/24/17 0500 04/25/17 0500  Weight: 73.2 kg (161 lb 6 oz) 72.6 kg (160 lb 0.9 oz) 73.2 kg (161 lb 6 oz)    Scheduled Meds: . amLODipine  10 mg Per Tube Daily  . aspirin  325 mg Per Tube Daily  . atorvastatin  40 mg Per Tube q1800  . chlorhexidine gluconate (MEDLINE KIT)  15 mL Mouth Rinse BID  . Chlorhexidine Gluconate Cloth  6 each Topical Daily  . cloNIDine  0.2 mg Per Tube BID  . heparin  5,000 Units Subcutaneous Q8H  . insulin aspart  0-20 Units Subcutaneous Q4H  . insulin glargine  30 Units Subcutaneous Daily  . labetalol  200 mg Per Tube Q8H  . levETIRAcetam  500 mg Per Tube BID  . mouth rinse  15 mL Mouth Rinse QID  . pantoprazole sodium  40 mg Per Tube Daily  . thiamine  100 mg Per Tube Daily   Continuous Infusions: . sodium chloride 10 mL/hr at 04/25/17 0600  . sodium chloride    . sodium chloride    . feeding supplement (VITAL AF 1.2 CAL) 1,000 mL (04/25/17 0600)  . furosemide Stopped (04/24/17 1035)   PRN Meds:.sodium chloride, sodium chloride, alteplase, bisacodyl, docusate, fentaNYL (SUBLIMAZE) injection, heparin, heparin, hydrALAZINE, labetalol, lidocaine (PF), lidocaine-prilocaine, midazolam, pentafluoroprop-tetrafluoroeth, sodium chloride flush  Current Labs: reviewed    Physical Exam:  Blood pressure (!) 149/79, pulse 68, temperature 99.5 F (37.5 C), temperature source Oral, resp. rate 20, height _0  (1.727 m), weight 73.2 kg (161 lb 6 oz), SpO2 100 %. Not awake RRR No edema R IJ Temp HD cath c/d/i Coarse BS b/l ETT and OGT in place  A 1. 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 B/l watershed CVA persistent encephalopathy 3. Seizure on AEDs 4. HTN prev with emergency 5. Cocaine use 6. VDRF 7. Present HD Catheter: R IJ TEMP 2/13 CCM 8. Anemia  P 1. No HD needed, I hope will have some future recovery but mght be at new baseline; azotemia reflects renal failure, enteral nutrition, diuresis 2. Remove HD catheter, no immediate plans for dialysis;  3. cont Lasix once daily for now; euvolemic 4. No further suggestions at the current time; will sign off for now but call us back if renal function deteriorates or other clinical concerns develop 5. Daily weights, Daily Renal Panel, Strict I/Os, Avoid nephrotoxins (NSAIDs, judicious IV Contrast)   Pearson Grippe MD 04/25/2017, 8:07 AM  Recent Labs  Lab 04/23/17 0448 04/24/17 0511 04/25/17 0411  NA 140 140 141  K 4.1 3.6 4.2  CL 97* 98* 100*  CO2 _1 GLUCOSE 197* 146* 215*  BUN 106* 106* 113*  CREATININE 3.31* 3.31* 3.25*  CALCIUM 8.5* 8.5* 8.5*  PHOS 5.1* 5.2* 5.2*   Recent Labs  Lab 04/22/17 0434 04/23/17 0448 04/25/17 0411  WBC 7.5 10.0 7.9  HGB 7.6* 7.5* 7.4*  HCT 23.7* 24.0* 24.0*  MCV 83.5 83.9 85.1  PLT 242 248 257

## 2017-04-25 NOTE — Progress Notes (Signed)
Nutrition Follow-up  DOCUMENTATION CODES:   Not applicable  INTERVENTION:   When able to begin TF via Cortrak tube, recommend:  Glucerna 1.2 at 65 ml/h (1560 ml per day)  Provides 1872 kcal, 94 gm protein, 1256 ml free water daily  NUTRITION DIAGNOSIS:   Inadequate oral intake related to inability to eat as evidenced by NPO status.  Ongoing  GOAL:   Patient will meet greater than or equal to 90% of their needs  Unmet, TF currently off  MONITOR:   TF tolerance, Diet advancement, Labs, I & O's  ASSESSMENT:   67 yo male with PMH of HTN, HLD, Jehovah's witness, DM-2, stroke who was admitted on 2/8 with seizures, HTN emergency, nonketotic hyperglycemia. Required intubation on admission.  Discussed patient in ICU rounds and with RN today. Patient was extubated today after Cortrak was placed. Cortrak tube tip is post-pyloric. Plans to remain NPO today, and resume TF tomorrow.  Labs and medications reviewed.  CBG's: [phone]  Diet Order:  Seizure precautions Diet NPO time specified  EDUCATION NEEDS:   No education needs have been identified at this time  Skin:  Skin Assessment: Skin Integrity Issues: Skin Integrity Issues:: Other (Comment) Other: laceration to head  Last BM:  2/20  Height:   Ht Readings from Last 1 Encounters:  04/11/17 5\' 8"  (1.727 m)    Weight:   Wt Readings from Last 1 Encounters:  04/25/17 161 lb 6 oz (73.2 kg)    Ideal Body Weight:  70 kg  BMI:  Body mass index is 24.54 kg/m.  Estimated Nutritional Needs:   Kcal:  1800-2000  Protein:  90-100 gm  Fluid:  1.8-2 L    Molli Barrows, RD, LDN, [place] Pager [phone] After Hours Pager [phone]

## 2017-04-25 NOTE — Progress Notes (Signed)
HPI 67 year old diabetic, hypertensive with known CVA admitted 2/8 with acute encephalopathy and seizures in the setting of cocaine use, head CT showing bilateral watershed infarcts He developed AK I requiring dialysis . He opens eyes to call of name, but follows no commands, dose not focus or track. He was extubated 2/22 to [place].  PHYSICAL EXAMINATION:  General: Elderly male, appears older than stated age, extubated, follows no commands Neuro: Pupils 2mm equal and reactive b/l, opens eyes to call of name, but does not focus or track, no follow commands. ? purposeful movement of left arm ( trying to pull out ETT). NoR sided movement noted HEENT: poor dentition, MM moist, -JVD, HD cath dressing ( removed 2/22) Cardiovascular: RRR no m/r/g, S1, S2 Lungs: Few rhonchi otherwise clear, bilateral excusion, Respirations regular and unlabored Abdomen: Soft NTND, BS hypoactive Musculoskeletal: no LE edema, normal muscular bulk Skin: no rashes, lesions, warm dry and intact  Chest x-ray from 2/20 : No acute cardiopulmonary abnormality.   Impression/plan  Acute respiratory failure-   Extubated 2/22 to [place] Tolerating well Plan: Titrate Oxygen for sats > 94% Pulmonary toilet as able Mobilize as able CXR prn  Acute encephalopathy/seizures due to new bilateral watershed infarcts in the setting of cocaine use- - mild neurological improvement, Plan: Neuro assessments per unit routine   continue Keppra and Vimpat.  AKI- required transient dialysis but creatinine has plateaued Labs show creatinine has plateaued at 3.3. Now down trending Phos uptrending Plan: Continue Lasix once daily,  Discontinue Right IJ  HD cath Trend BMET Repelete electrolytes prn  Dr. Elsworth Soho discussed Goals of care  with brother Ruthann Cancer,  was also discussed with his other brother many, his son in [place] is somewhat estranged and they have kept him abreast of developments.  They do desire reintubation should he fail  extubation and tracheostomy if needed given that he has had some neurological improvement.    Magdalen Spatz  AGACNP--BC [place] [facility] Pager: [phone] 04/25/2017  12:52 PM

## 2017-04-25 NOTE — Procedures (Signed)
Extubation Procedure Note  Patient Details:   Name: Richard Bennett DOB: 1950-06-18 MRN: [id number]   Airway Documentation:     Evaluation  O2 sats: stable throughout Complications: No apparent complications Patient did tolerate procedure well. Bilateral Breath Sounds: Stridor   No.  Positive cuff leak noted.  Pt placed on [place] 4 L with humidity, stridor noted - RN and MD notified.  No increased  WOB, sats 100%, MD came and checked on pt, he stated to watch to see if stridor gets worse. RT will continue to monitor. Mingo Amber Louis Gaw 04/25/2017, 10:50 AM

## 2017-04-25 NOTE — Progress Notes (Signed)
  67 year old diabetic, hypertensive with known CVA admitted 2/8 with acute encephalopathy and seizures in the setting of cocaine use, head CT showing bilateral watershed infarcts He developed AK I requiring dialysis  On exam-more agitated this morning and restrained on the left side, opens eyes to name and tracks but does not follow commands, clear breath sounds bilateral minimal secretions. Chest x-ray personally reviewed, no new infiltrates. Labs show BUN continued to rise but creatinine stable, anemia and no leukocytosis.  Impression/plan Acute respiratory failure -he weaned well on T-piece and was extubated to nasal cannula, mild upper airway stridor noted which seemed to settle down after changing to more upright position  AKI -creatinine has plateaued, since BUN rising, will discontinue Lasix, HD catheter was removed  Acute encephalopathy/seizures due to new bilateral watershed infarcts in the setting of cocaine use-he has shown mild neurological improvement, hence reasonable to push forward, continue Keppra and Vimpat.  From 2/21 -Goals of care discussed with brother Ruthann Cancer was discussed with his other brother Tresa Moore, his son in [place] is somewhat estranged and they have kept him abreast of developments.  They do desire reintubation should he fail extubation and tracheostomy if needed given that he has had some neurological improvement  The patient is critically ill with multiple organ systems failure and requires high complexity decision making for assessment and support, frequent evaluation and titration of therapies, application of advanced monitoring technologies and extensive interpretation of multiple databases. Critical Care Time devoted to patient care services described in this note independent of APP/resident  time is 35 minutes.   Leanna Sato Elsworth Soho MD

## 2017-04-25 NOTE — Progress Notes (Signed)
Cortrak Tube Team Note:  Consult received to place a Cortrak feeding tube.   A 10 F Cortrak tube was placed in the right nare and secured with a nasal bridle at 80 cm. Per the Cortrak monitor reading the tube tip is post pyloric.   No x-ray is required. RN may begin using tube.   If the tube becomes dislodged please keep the tube and contact the Cortrak team at [email or web address] (password [email or web address]) for replacement.  If after hours and replacement cannot be delayed, place a NG tube and confirm placement with an abdominal x-ray.   Mariana Single RD, LDN Clinical Nutrition Pager # [phone]

## 2017-04-25 NOTE — Progress Notes (Signed)
   04/25/17 1600  Clinical Encounter Type  Visited With Patient  Visit Type Follow-up  Referral From Chaplain  Consult/Referral To Chaplain  Spiritual Encounters  Spiritual Needs Emotional  Stress Factors  Patient Stress Factors Exhausted  Family Stress Factors None identified    Pt was still intubated when I visited this morning. No family member on-site but Pt's nurse talked aboiut family visit during the past  weekend. I provided compassionate presence for both nurse and pt.   Alixis Harmon a Medical sales representative, [employer]

## 2017-04-26 LAB — CBC
HCT: 27.1 % — ABNORMAL LOW (ref 39.0–52.0)
HEMOGLOBIN: 8.5 g/dL — AB (ref 13.0–17.0)
MCH: 27 pg (ref 26.0–34.0)
MCHC: 31.4 g/dL (ref 30.0–36.0)
MCV: 86 fL (ref 78.0–100.0)
Platelets: 279 10*3/uL (ref 150–400)
RBC: 3.15 MIL/uL — ABNORMAL LOW (ref 4.22–5.81)
RDW: 13.8 % (ref 11.5–15.5)
WBC: 7.5 10*3/uL (ref 4.0–10.5)

## 2017-04-26 LAB — RENAL FUNCTION PANEL
Albumin: 2.2 g/dL — ABNORMAL LOW (ref 3.5–5.0)
Anion gap: 17 — ABNORMAL HIGH (ref 5–15)
BUN: 113 mg/dL — ABNORMAL HIGH (ref 6–20)
CALCIUM: 8.7 mg/dL — AB (ref 8.9–10.3)
CHLORIDE: 101 mmol/L (ref 101–111)
CO2: 23 mmol/L (ref 22–32)
CREATININE: 3.02 mg/dL — AB (ref 0.61–1.24)
GFR calc Af Amer: 23 mL/min — ABNORMAL LOW (ref >60)
GFR calc non Af Amer: 20 mL/min — ABNORMAL LOW (ref >60)
GLUCOSE: 171 mg/dL — AB (ref 65–99)
Phosphorus: 5.5 mg/dL — ABNORMAL HIGH (ref 2.5–4.6)
Potassium: 3.7 mmol/L (ref 3.5–5.1)
SODIUM: 141 mmol/L (ref 135–145)

## 2017-04-26 LAB — GLUCOSE, CAPILLARY
GLUCOSE-CAPILLARY: 163 mg/dL — AB (ref 65–99)
GLUCOSE-CAPILLARY: 169 mg/dL — AB (ref 65–99)
GLUCOSE-CAPILLARY: 189 mg/dL — AB (ref 65–99)
Glucose-Capillary: 176 mg/dL — ABNORMAL HIGH (ref 65–99)
Glucose-Capillary: 236 mg/dL — ABNORMAL HIGH (ref 65–99)
Glucose-Capillary: 177 mg/dL — ABNORMAL HIGH (ref 65–99)

## 2017-04-26 MED ORDER — ORAL CARE MOUTH RINSE
15.0000 mL | Freq: Two times a day (BID) | OROMUCOSAL | Status: DC
  Administered 2017-04-26 – 2017-05-04 (×16): 15 mL via OROMUCOSAL

## 2017-04-26 NOTE — Evaluation (Signed)
Clinical/Bedside Swallow Evaluation Patient Details  Name: Richard Bennett MRN: [id number] Date of Birth: Jul 29, 1950  Today's Date: 04/26/2017 Time: SLP Start Time (ACUTE ONLY): 0854 SLP Stop Time (ACUTE ONLY): 0904 SLP Time Calculation (min) (ACUTE ONLY): 10 min  Past Medical History:  Past Medical History:  Diagnosis Date  . Chest pain   . Colon polyps    adenomatous  . Hyperlipidemia   . Hypertension   . Refusal of blood transfusions as patient is Jehovah's Witness   . Stroke ([place])   . Type II diabetes mellitus ([place])    Past Surgical History:  Past Surgical History:  Procedure Laterality Date  . APPENDECTOMY    . BACK SURGERY    . LUMBAR DISC SURGERY     "herniated"   HPI:  14yoM with hx CVA, DM, HTN, presents to the ER today with acute encephalopathy and seizures. Initially patient was wandering down hallway of apartment and police were called. EMS arrived to find patient in a bathtub initially unresponsive then combative. He had a seizure during this process and was given versed 5mg . Patient transported to [facility] ER where blood glucose 715 initially and patient with acute encephalopathy. Patient had a head lac that was sutured in the ER. He had a 2nd seizure in the ER. He continued to be altered combative following this seizure, repeatedly trying to climb out of bed. He received a total of 9mg  Versed and 4mg  Ativan while in the ER. He continued to be agitated and was intubated by ER staff. He was finally extubated on 04/25/2017 after a 14 day intubation.   Most recent MRI dated 04/16/2017 is showing new acute infarcts bilaterally left greater then right that appear to be watershed infarcts.  Chest xray dated 04/25/2017 is showing no acute cardiopulmonary issues.  Patient is known to [facility] service from previous admission with swallowing evaluation dated 07/25/2016 with recommendation for a dysphagia 2 diet with nectar thick liquids.     Assessment / Plan / Recommendation Clinical  Impression  Clinical swallowing evaluation was completed using ice chips only in setting of admission for seizures with a 14 day intubation.  Oral mechanism exam was attempted and not completed as the patient was unable to follow commands.  He was also not verbal.  Patient was noted with active bleeding from his right nare where feeding tube is located.  Nursing was notified who reported he has had issues with nose bleeds.   Oral care was provided prior to attempting any PO's and patient was resistive keeping his teeth and lips clamped together.  He intermittently would open his mouth and the oral mucosa appeared pink and moist.  Attempted ice chips which were placed in the patient's mouth and he made no attempts to manipulate the material.  Given this it was removed from his oral cavity using oral suction.  Recommend that the patient remain NPO with alternate means continued for nutrition/hyrdation/meds.  ST will follow up for PO readiness.   SLP Visit Diagnosis: Dysphagia, oral phase (R13.11)    Aspiration Risk  Severe aspiration risk    Diet Recommendation   NPO  Medication Administration: Via alternative means    Other  Recommendations Oral Care Recommendations: Oral care QID   Follow up Recommendations Other (comment)(TBD, however, suspect he will need extensive ST follow up.  )      Frequency and Duration min 2x/week  2 weeks       Prognosis Prognosis for Safe Diet Advancement: Fair Barriers to Reach  Goals: Cognitive deficits;Severity of deficits      Swallow Study   General Date of Onset: 04/11/17 HPI: 61yoM with hx CVA, DM, HTN, presents to the ER today with acute encephalopathy and seizures. Initially patient was wandering down hallway of apartment and police were called. EMS arrived to find patient in a bathtub initially unresponsive then combative. He had a seizure during this process and was given versed 5mg . Patient transported to [facility] ER where blood glucose 715 initially and  patient with acute encephalopathy. Patient had a head lac that was sutured in the ER. He had a 2nd seizure in the ER. He continued to be altered combative following this seizure, repeatedly trying to climb out of bed. He received a total of 9mg  Versed and 4mg  Ativan while in the ER. He continued to be agitated and was intubated by ER staff. He was finally extubated on 04/25/2017 after a 14 day intubation.   Most recent MRI dated 04/16/2017 is showing new acute infarcts bilaterally left greater then right that appear to be watershed infarcts.  Chest xray dated 04/25/2017 is showing no acute cardiopulmonary issues.  Patient is known to [facility] service from previous admission with swallowing evaluation dated 07/25/2016 with recommendation for a dysphagia 2 diet with nectar thick liquids.   Type of Study: Bedside Swallow Evaluation Previous Swallow Assessment: 07/25/2016 with rx for dysphagia 2 with nectar thick liquids  Unclear how/when or if patient diet advanced. Diet Prior to this Study: NG Tube Temperature Spikes Noted: No History of Recent Intubation: Yes Length of Intubations (days): 14 days Date extubated: 04/25/17 Behavior/Cognition: Alert;Cooperative;Doesn't follow directions Oral Cavity Assessment: Within Functional Limits Oral Care Completed by SLP: Yes Oral Cavity - Dentition: Missing dentition;Poor condition Self-Feeding Abilities: Total assist Patient Positioning: Upright in bed Baseline Vocal Quality: Not observed Volitional Cough: Cognitively unable to elicit Volitional Swallow: Unable to elicit    Oral/Motor/Sensory Function Overall Oral Motor/Sensory Function: Other (comment)(Unable to assess)   Ice Chips Ice chips: Impaired Presentation: Spoon Oral Phase Impairments: Poor awareness of bolus(Pt never moved material from oral cavity and was suctioned. ) Oral Phase Functional Implications: Oral residue;Oral holding Pharyngeal Phase Impairments: (No pharyngeal swallow trigger)   Thin  Liquid Thin Liquid: Not tested    Nectar Thick Nectar Thick Liquid: Not tested   Honey Thick Honey Thick Liquid: Not tested   Puree Puree: Not tested   Solid   GO   Solid: Not tested       Shelly Flatten, MA, CCC-SLP Acute Rehab SLP [phone] Lamar Sprinkles 04/26/2017,9:18 AM

## 2017-04-26 NOTE — Progress Notes (Signed)
Patient has not voided post foley removal. Bladder scan done with 439ML. Md made aware, in and out cath done. 500ML urine obtained. Will continue to monitor

## 2017-04-26 NOTE — Progress Notes (Signed)
Patient has not voided since last in and out cath at 1300hrs. Bladder scan = 246ml. MD made aware, received orders to reinsert foley due to acute urinary retention.

## 2017-04-26 NOTE — Progress Notes (Addendum)
[facility] P[facility]M Progress Note   Brief Summary:  67 year old diabetic, hypertensive with known CVA admitted 2/8 with acute encephalopathy and seizures in the setting of cocaine use, head CT showing bilateral watershed infarcts.  He developed AKI requiring dialysis and required mechanical ventilation. Extubated / off HD.      S:  RN reports nose bleeding / mild.  No acute events overnight.  Awake but still not following commands.   O: Blood pressure 132/70, pulse 62, temperature 98.6 F (37 C), temperature source Oral, resp. rate 18, height 5\' 8"  (1.727 m), weight 161 lb 9.6 oz (73.3 kg), SpO2 99 %.   General: chronically ill appearing male in NAD  HEENT: MM pink/moist, coretrak in place PSY: calm Neuro: eyes open, looks around, moves L side but does not follow commands  CV: s1s2 rrr, no m/r/g PULM: even/non-labored, lungs bilaterally coarse  JG:OTLX, non-tender, bsx4 active  Extremities: warm/dry, trace generalized edema  Skin: no rashes or lesions   Recent Labs  Lab 04/23/17 0448 04/25/17 0411 04/26/17 0714  HGB 7.5* 7.4* 8.5*  HCT 24.0* 24.0* 27.1*  WBC 10.0 7.9 7.5  PLT 248 257 279   Recent Labs  Lab 04/22/17 0434 04/23/17 0448 04/24/17 0511 04/25/17 0411 04/26/17 0714  NA 140 140 140 141 141  K 3.3* 4.1 3.6 4.2 3.7  CL 95* 97* 98* 100* 101  CO2 27 28 27 26 23   GLUCOSE 204* 197* 146* 215* 171*  BUN 98* 106* 106* 113* 113*  CREATININE 3.29* 3.31* 3.31* 3.25* 3.02*  CALCIUM 8.4* 8.5* 8.5* 8.5* 8.7*  MG  --  2.3  --  2.6*  --   PHOS 5.8* 5.1* 5.2* 5.2* 5.5*   SIGNIFICANT EVENTS 2/08  Admit with acute encephalopathy, seizures, hyperglycemia, and HTN emergency 2/09  Started on Dilantin per neurology  2/10  Sedated and now with right hemiparesis for an MRI 2/14  Hemodialysis 2/15  Tolerating CPAP/PS but not following commands 2/20  Had to be placed back on vent support, still not following commands 2/22  Extubated   LINES/TUBES ETT 2/8 >> 2/22 OG tube 2/8 >>  2/22  R IJ HD cath 2/13 >> 2/22 Foley 2/10 (placed by urology secondary to urethral stricture) >> out   CULTURES Blood 2/08 >> Negative   A: At Risk Respiratory Failure / Aspiration  Acute Respiratory Failure secondary to Encephalopathy - resolved  Stridor - mild, post extubation / resolved  Acute Encephalopathy - resolving  Hx Cocaine Abuse  Acute on Chronic Watershed CVA Hypertensive Emergency - resolved HTN HLD  Hx Carotid Artery Stenosis  Seizures  AKI  Urethral Stricture  Anemia - Jehovah Witness Hyperglycemia - resolved  At Risk Malnutrition   P: Transfer to SDU  Pulmonary hygiene as able -IS, mobilize  Aspiration precautions  Trend BMP / urinary output Replace electrolytes as indicated Avoid nephrotoxic agents, ensure adequate renal perfusion Continue Keppra, Vimpat  Continue norvasc, clonidine, labetalol for BP control  Continue lantus, SSI for hyperglycemia  PPI  Continue TF for Nutrition Will need SLP once mental status supports vs PEG Bowel regimen > dulcolax PRN, colace PRN  ASA, lipitor  Trend CBC  Goals of Care - per prior discussion, family would want aggressive care including trach if patient required.  If he were to need HD in the future, this could complicate placement due to trach / HD needs if he were to required.   Transfer primary medical care to [facility] as of 2/24 am.    Velna Hatchet  Alfredo Martinez NP-C [place] [facility] Pgr: [phone] or if no answer [phone] 04/26/2017, 9:28 AM

## 2017-04-26 NOTE — Evaluation (Signed)
Physical Therapy Evaluation Patient Details Name: Richard Bennett MRN: [id number] DOB: [birth year] Today's Date: 04/26/2017   History of Present Illness  Pt is a 67 y/o male admitted secondary to seizures, UDS + for cocaine and benzos. Pt also with acute on chronic CVA, felt to be watershed in nature. Pt required intubation with vent support from 2/8 to 2/22. Pt also required CRRT for a brief period. PMH including but not limited to CVA, DM and HTN.    Clinical Impression  Pt presented in bed with HOB elevated, awake but not following commands and not responding verbally or non-verbally to therapist. No family or caregivers present during evaluation to provide any information. Pt currently requires total A for all aspects of care and functional mobility. Pt with no active movements observed on R side (UE and LE). Pt with intermittent movements of L UE and L LE. All VSS throughout.  Will trial acute PT but anticipate pt will need custodial care.      Follow Up Recommendations SNF;Other (comment)(anticipate pt needing long term custodial care)    Equipment Recommendations  None recommended by PT    Recommendations for Other Services       Precautions / Restrictions Precautions Precautions: Fall Precaution Comments: NG tube, R hemi Restrictions Weight Bearing Restrictions: No      Mobility  Bed Mobility Overal bed mobility: Needs Assistance             General bed mobility comments: Total A for all aspects  Transfers                 General transfer comment: pt requires mechanical lift  Ambulation/Gait                Stairs            Wheelchair Mobility    Modified Rankin (Stroke Patients Only) Modified Rankin (Stroke Patients Only) Pre-Morbid Rankin Score: No symptoms Modified Rankin: Severe disability     Balance                                             Pertinent Vitals/Pain Pain Assessment: Faces Faces Pain  Scale: No hurt    Home Living Family/patient expects to be discharged to:: Unsure                 Additional Comments: pt not following commands or making any verbalizations during session; no family/caregivers present either    Prior Function           Comments: unsure - pt unable to provide any information, no family/caregivers present     Hand Dominance        Extremity/Trunk Assessment   Upper Extremity Assessment Upper Extremity Assessment: RUE deficits/detail RUE Deficits / Details: PROM WNL throughout; edema noted in hand; no active movement observed at all     Lower Extremity Assessment Lower Extremity Assessment: RLE deficits/detail RLE Deficits / Details: PROM WNL throughout; no active movement observed at all; no withdrawal to painful stimuli       Communication   Communication: Expressive difficulties;Receptive difficulties  Cognition Arousal/Alertness: Awake/alert Behavior During Therapy: Flat affect Overall Cognitive Status: Difficult to assess Area of Impairment: Following commands                       Following Commands: (not following any commands)  General Comments: pt not following any commands or responding verbally/non-verbally; pt with intermittent visual tracking for brief periods (2-3 seconds), inconsistent      General Comments      Exercises     Assessment/Plan    PT Assessment Patient needs continued PT services  PT Problem List Decreased strength;Decreased activity tolerance;Decreased balance;Decreased mobility;Decreased coordination;Decreased knowledge of use of DME;Decreased safety awareness;Decreased cognition;Decreased knowledge of precautions;Cardiopulmonary status limiting activity       PT Treatment Interventions Functional mobility training;Therapeutic activities;Therapeutic exercise;Balance training;Neuromuscular re-education;Cognitive remediation;Patient/family education    PT Goals (Current  goals can be found in the Care Plan section)  Acute Rehab PT Goals Patient Stated Goal: unable to state PT Goal Formulation: Patient unable to participate in goal setting Time For Goal Achievement: 05/10/17 Potential to Achieve Goals: Fair    Frequency Min 1X/week(trial)   Barriers to discharge        Co-evaluation               AM-PAC PT "6 Clicks" Daily Activity  Outcome Measure Difficulty turning over in bed (including adjusting bedclothes, sheets and blankets)?: Unable Difficulty moving from lying on back to sitting on the side of the bed? : Unable Difficulty sitting down on and standing up from a chair with arms (e.g., wheelchair, bedside commode, etc,.)?: Unable Help needed moving to and from a bed to chair (including a wheelchair)?: Total Help needed walking in hospital room?: Total Help needed climbing 3-5 steps with a railing? : Total 6 Click Score: 6    End of Session   Activity Tolerance: Patient limited by fatigue;Other (comment)(limited due to cognitive deficits; unable to follow commands) Patient left: in bed;with call bell/phone within reach;with bed alarm set;with SCD's reapplied Nurse Communication: Mobility status;Need for lift equipment PT Visit Diagnosis: Other abnormalities of gait and mobility (R26.89);Other symptoms and signs involving the nervous system (R29.898);Hemiplegia and hemiparesis Hemiplegia - Right/Left: Right Hemiplegia - caused by: Cerebral infarction    Time: 1448-1500 PT Time Calculation (min) (ACUTE ONLY): 12 min   Charges:   PT Evaluation $PT Eval Moderate Complexity: 1 Mod     PT G Codes:        [place], PT, DPT [place] 04/26/2017, 4:01 PM

## 2017-04-27 DIAGNOSIS — F1910 Other psychoactive substance abuse, uncomplicated: Secondary | ICD-10-CM

## 2017-04-27 DIAGNOSIS — I6389 Other cerebral infarction: Secondary | ICD-10-CM

## 2017-04-27 DIAGNOSIS — R4789 Other speech disturbances: Secondary | ICD-10-CM

## 2017-04-27 DIAGNOSIS — D649 Anemia, unspecified: Secondary | ICD-10-CM

## 2017-04-27 LAB — RENAL FUNCTION PANEL
Albumin: 2.2 g/dL — ABNORMAL LOW (ref 3.5–5.0)
Anion gap: 13 (ref 5–15)
BUN: 113 mg/dL — AB (ref 6–20)
CALCIUM: 8.5 mg/dL — AB (ref 8.9–10.3)
CHLORIDE: 105 mmol/L (ref 101–111)
CO2: 23 mmol/L (ref 22–32)
CREATININE: 2.71 mg/dL — AB (ref 0.61–1.24)
GFR calc non Af Amer: 23 mL/min — ABNORMAL LOW (ref >60)
GFR, EST AFRICAN AMERICAN: 27 mL/min — AB (ref >60)
GLUCOSE: 178 mg/dL — AB (ref 65–99)
Phosphorus: 4.7 mg/dL — ABNORMAL HIGH (ref 2.5–4.6)
Potassium: 3.9 mmol/L (ref 3.5–5.1)
SODIUM: 141 mmol/L (ref 135–145)

## 2017-04-27 LAB — GLUCOSE, CAPILLARY
GLUCOSE-CAPILLARY: 141 mg/dL — AB (ref 65–99)
Glucose-Capillary: 173 mg/dL — ABNORMAL HIGH (ref 65–99)
Glucose-Capillary: 189 mg/dL — ABNORMAL HIGH (ref 65–99)
Glucose-Capillary: 187 mg/dL — ABNORMAL HIGH (ref 65–99)
Glucose-Capillary: 173 mg/dL — ABNORMAL HIGH (ref 65–99)
Glucose-Capillary: 158 mg/dL — ABNORMAL HIGH (ref 65–99)
Glucose-Capillary: 117 mg/dL — ABNORMAL HIGH (ref 65–99)
Glucose-Capillary: 138 mg/dL — ABNORMAL HIGH (ref 65–99)
Glucose-Capillary: 159 mg/dL — ABNORMAL HIGH (ref 65–99)

## 2017-04-27 LAB — CBC
HCT: 25.6 % — ABNORMAL LOW (ref 39.0–52.0)
Hemoglobin: 8 g/dL — ABNORMAL LOW (ref 13.0–17.0)
MCH: 26.8 pg (ref 26.0–34.0)
MCHC: 31.3 g/dL (ref 30.0–36.0)
MCV: 85.9 fL (ref 78.0–100.0)
PLATELETS: 282 10*3/uL (ref 150–400)
RBC: 2.98 MIL/uL — ABNORMAL LOW (ref 4.22–5.81)
RDW: 13.4 % (ref 11.5–15.5)
WBC: 8.5 10*3/uL (ref 4.0–10.5)

## 2017-04-27 MED ORDER — SODIUM CHLORIDE 0.9 % IV SOLN
75.0000 mL/h | INTRAVENOUS | Status: DC
  Administered 2017-04-27: 75 mL/h via INTRAVENOUS

## 2017-04-27 MED ORDER — HEPARIN SODIUM (PORCINE) 5000 UNIT/ML IJ SOLN
5000.0000 [IU] | Freq: Three times a day (TID) | INTRAMUSCULAR | Status: DC
  Administered 2017-04-28 – 2017-04-29 (×2): 5000 [IU] via SUBCUTANEOUS
  Filled 2017-04-27 (×2): qty 1

## 2017-04-27 MED ORDER — THIAMINE HCL 100 MG/ML IJ SOLN
100.0000 mg | Freq: Every day | INTRAMUSCULAR | Status: DC
  Administered 2017-04-28 – 2017-04-29 (×2): 100 mg via INTRAVENOUS
  Filled 2017-04-27 (×2): qty 2

## 2017-04-27 MED ORDER — FOLIC ACID 5 MG/ML IJ SOLN
1.0000 mg | Freq: Every day | INTRAMUSCULAR | Status: DC
  Administered 2017-04-27 – 2017-04-29 (×3): 1 mg via INTRAVENOUS
  Filled 2017-04-27 (×3): qty 0.2

## 2017-04-27 MED ORDER — LORAZEPAM 2 MG/ML IJ SOLN: 1.0000 mg | INTRAMUSCULAR | Status: DC | PRN

## 2017-04-27 NOTE — Progress Notes (Signed)
Patient transferred to 2c11 stable, nonverbal with NG tube to right side bleeding that has continued from previously of the day. I will continue to monitor.

## 2017-04-27 NOTE — Progress Notes (Signed)
PROGRESS NOTE    Richard Bennett  JSE:[id number] DOB: January 19, 1951 DOA: 04/11/2017 PCP: [name], MD   Brief Narrative:   67 y.o. 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  Per the report patient was wandering around and is nonverbal and later found to have a generalized tonic-clonic seizure.  EMS was called and patient was given Versed.  No further history is available as family is not able to be reached.  Patient is encephalopathic.     Subjective: 2/24 eyes open spontaneously moves left upper extremity, does not follow commands nonverbal.     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 See goals of care  Acute respiratory failure with hypoxia -Resolved -Titrate O2 to maintain SPO2> 93%   Acute encephalopathy -Currently unresponsive.  Seizures -500 mg BID  -Seizure protocol  Dysphagia -Secondary to bilateral watershed infarct -2/23 failed swallow study. Consult for PEG tube placement.  Polysubstance abuse  -On admission positive for cocaine and benzodiazepine  Anemia -No serious signs of bleeding. Some dried blood around right nare. -Anemia panel pending   Goals of care -2/24 PALLIATIVE CARE: Consult placed.Bilateral Watershed infarct, eyes open unresponsive. Family with on realistic expectations. Address CODE STATUS should be DO NOT  RESUSCITATE,     DVT prophylaxis: Subcutaneous heparin Code Status: Full Family Communication: None Disposition Plan: SNF      Consultants:  Stroke team [facility] M     Procedures/Significant Events:  2/08  Admit with acute encephalopathy, seizures, hyperglycemia, and HTN emergency 2/09  Started on Dilantin per neurology  2/10  Sedated and now with right hemiparesis for an MRI 2/14  Hemodialysis 2/15  Tolerating CPAP/PS but not following commands 2/20  Had to be placed back on vent support, still not following commands 2/22  Extubated     I have personally reviewed and interpreted all radiology studies and my findings are as above.  VENTILATOR SETTINGS: None   Cultures 2/8 blood negative   Antimicrobials: Anti-infectives (From admission, onward)   Start     Stop   04/11/17 1345  Ampicillin-Sulbactam (UNASYN) 3 g in sodium chloride 0.9 % 100 mL IVPB     04/17/17 1315   04/11/17 0730  Ampicillin-Sulbactam (UNASYN) 3 g in sodium chloride 0.9 % 100 mL IVPB  Status:  Discontinued     04/11/17 1330       Devices   LINES / TUBES:  ETT 2/8 >> 2/22 OG tube 2/8 >> 2/22  R IJ HD cath 2/13 >> 2/22 Foley 2/10 (placed by urology secondary to urethral stricture) >> out      Continuous Infusions: . sodium chloride Stopped (04/25/17 1000)  . feeding supplement (VITAL AF 1.2 CAL) 1,000 mL (04/27/17 0800)     Objective: Vitals:   04/27/17 0337 04/27/17 0505 04/27/17 0700 04/27/17 0823  BP: (!) 144/59 127/67  (!) 155/77  Pulse: 63 66    Resp:  Temp:    98.4 F (36.9 C)  TempSrc: Axillary   Oral  SpO2: 100%     Weight:   160 lb 4.4 oz (72.7 kg)   Height:        Intake/Output Summary (Last 24 hours) at 04/27/2017 2956 Last data filed at 04/27/2017 0800 Gross per 24 hour  Intake 1063 ml  Output 1848 ml  Net -785 ml   Filed Weights   04/25/17 0500 04/26/17 0500 04/27/17 0700  Weight: 161 lb 6 oz (73.2 kg) 161 lb 9.6 oz (73.3 kg) 160 lb 4.4 oz (72.7  kg)    Examination:  General: Eyes open, noncommunicative, No acute respiratory distress Neck:  Negative scars, masses, torticollis, lymphadenopathy, JVD Lungs: Clear to auscultation bilaterally without wheezes or crackles Cardiovascular: Regular rate and rhythm without murmur gallop or rub normal S1 and S2 Abdomen: negative abdominal pain, nondistended, positive soft, bowel sounds, no rebound, no ascites, no appreciable mass Extremities: No significant cyanosis, clubbing, or edema bilateral lower extremities Skin: Negative rashes, lesions, ulcers Psychiatric:  Unable to evaluate secondary to CVA  Central nervous system:  Eyes open, spontaneously moves LUE, does not respond to painful stimuli except in LUE. Nonverbal, does not follow commands.  .     Data Reviewed: Care during the described time interval was provided by me .  I have reviewed this patient's available data, including medical history, events of note, physical examination, and all test results as part of my evaluation.   CBC: Recent Labs  Lab 04/22/17 0434 04/23/17 0448 04/25/17 0411 04/26/17 0714 04/27/17 0234  WBC 7.5 10.0 7.9 7.5 8.5  HGB 7.6* 7.5* 7.4* 8.5* 8.0*  HCT 23.7* 24.0* 24.0* 27.1* 25.6*  MCV 83.5 83.9 85.1 86.0 85.9  PLT 242 248 257 279 213   Basic Metabolic Panel: Recent Labs  Lab 04/23/17 0448 04/24/17 0511 04/25/17 0411 04/26/17 0714 04/27/17 0234  NA 140 140 141 141 141  K 4.1 3.6 4.2 3.7 3.9  CL 97* 98* 100* 101 105  CO2 _0 GLUCOSE 197* 146* 215* 171* 178*  BUN 106* 106* 113* 113* 113*  CREATININE 3.31* 3.31* 3.25* 3.02* 2.71*  CALCIUM 8.5* 8.5* 8.5* 8.7* 8.5*  MG 2.3  --  2.6*  --   --   PHOS 5.1* 5.2* 5.2* 5.5* 4.7*   GFR: Estimated Creatinine Clearance: 25.9 mL/min (A) (by C-G formula based on SCr of 2.71 mg/dL (H)). Liver Function Tests: Recent Labs  Lab 04/23/17 0448 04/24/17 0511 04/25/17 0411 04/26/17 0714 04/27/17 0234  ALBUMIN 2.1* 2.0* 2.1* 2.2* 2.2*    No results for input(s): LIPASE, AMYLASE in the last 168 hours. No results for input(s): AMMONIA in the last 168 hours. Coagulation Profile: Recent Labs  Lab 04/23/17 0448  INR 1.02   Cardiac Enzymes: No results for input(s): CKTOTAL, CKMB, CKMBINDEX, TROPONINI in the last 168 hours. BNP (last 3 results) No results for input(s): PROBNP in the last 8760 hours. HbA1C: No results for input(s): HGBA1C in the last 72 hours. CBG: Recent Labs  Lab 04/26/17 1632 04/26/17 1926 04/27/17 0100 04/27/17 0346 04/27/17 0806  GLUCAP 236* 177* 141* 187* 173*   Lipid Profile: No results for input(s): CHOL, HDL, LDLCALC, TRIG, CHOLHDL, LDLDIRECT in the last 72 hours. Thyroid Function Tests: No results for input(s): TSH, T4TOTAL, FREET4, T3FREE, THYROIDAB in the last 72 hours. Anemia Panel: No results for input(s): VITAMINB12, FOLATE, FERRITIN, TIBC, IRON, RETICCTPCT in the last 72 hours. Urine analysis:  Component Value Date/Time   COLORURINE STRAW (A) 04/11/2017 [place] 04/11/2017 0447   LABSPEC 1.013 04/11/2017 0447   PHURINE 7.0 04/11/2017 0447   GLUCOSEU >=500 (A) 04/11/2017 0447   HGBUR SMALL (A) 04/11/2017 0447   BILIRUBINUR NEGATIVE 04/11/2017 0447   KETONESUR NEGATIVE 04/11/2017 0447   PROTEINUR 100 (A) 04/11/2017 0447   NITRITE NEGATIVE 04/11/2017 0447   LEUKOCYTESUR NEGATIVE 04/11/2017 0447   Sepsis Labs: _0 (procalcitonin:4,lacticidven:4)  )No results found for this or any previous visit (from the past 240 hour(s)).       Radiology Studies: No results found.      Scheduled Meds: . amLODipine  10 mg Per Tube Daily  . aspirin  325 mg Per Tube Daily  . atorvastatin  40 mg Per Tube q1800  . chlorhexidine gluconate (MEDLINE KIT)  15 mL Mouth Rinse BID  . cloNIDine  0.2 mg Per Tube BID  . heparin  5,000 Units Subcutaneous Q8H  . insulin aspart  0-20 Units Subcutaneous Q4H  . insulin glargine  30 Units Subcutaneous Daily  .  labetalol  200 mg Per Tube Q8H  . levETIRAcetam  500 mg Per Tube BID  . mouth rinse  15 mL Mouth Rinse q12n4p  . pantoprazole sodium  40 mg Per Tube Daily  . thiamine  100 mg Per Tube Daily   Continuous Infusions: . sodium chloride Stopped (04/25/17 1000)  . feeding supplement (VITAL AF 1.2 CAL) 1,000 mL (04/27/17 0800)     LOS: 16 days    Time spent: 40 minutes    WOODS, Geraldo Docker, MD [facility] Pager [phone]   If 7PM-7AM, please contact night-coverage [email or web address] Password [facility] 04/27/2017, 9:29 AM

## 2017-04-27 NOTE — Clinical Social Work Note (Signed)
CSW spoke with pt's son regarding placement. Pt's son handed the phone to pt's daughter in law. Pt's family is looking for long term placement for pt. Pt does not have medicaid. CSW explained to pt's daughter in law that Medicaid would cover a long term placement and she states pt's son will go Monday and apply pt for Medicaid. Pt's daughter in law agreeable to [place] f/o however does not want pt to go to [employer]. CSW to follow up with family closer to d/c. CSW to look into facilities with long term beds.  [place], [place]

## 2017-04-27 NOTE — Progress Notes (Signed)
  Speech Language Pathology Treatment: Dysphagia  Patient Details Name: Richard Bennett MRN: [id number] DOB: 1950/08/15 Today's Date: 04/27/2017 Time: 7253-6644 SLP Time Calculation (min) (ACUTE ONLY): 20 min  Assessment / Plan / Recommendation Clinical Impression  F/u after yesterday's initial clinical swallow assessment.  Oral care provided - pt with improved willingness to allow oral care today.  Alert, not following commands. Does not anticipate approaching tspn to lips, but when material touches lips today he accepts ice chips and masticates, which is improved from yesterday. Max verbal/tactile cues provided.  Consistent swallow response was elicited, followed by mild cough/throat clear. Not yet ready for PO diet, but pt is more participatory.  Vocalized multiple times unintelligibly.  SLP will continue to follow - pt would benefit from a speech/language evaluation.    HPI HPI: Richard Bennett with hx CVA, DM, HTN, presents to the ER today with acute encephalopathy and seizures. Initially patient was wandering down hallway of apartment and police were called. EMS arrived to find patient in a bathtub initially unresponsive then combative. He had a seizure during this process and was given versed 5mg . Patient transported to [facility] ER where blood glucose 715 initially and patient with acute encephalopathy. Patient had a head lac that was sutured in the ER. He had a 2nd seizure in the ER. He continued to be altered combative following this seizure, repeatedly trying to climb out of bed. He received a total of 9mg  Versed and 4mg  Ativan while in the ER. He continued to be agitated and was intubated by ER staff. He was finally extubated on 04/25/2017 after a 14 day intubation.   Most recent MRI dated 04/16/2017 is showing new acute infarcts bilaterally left greater then right that appear to be watershed infarcts.  Chest xray dated 04/25/2017 is showing no acute cardiopulmonary issues.  Patient is known to [place] [facility] service  from previous admission with swallowing evaluation dated 07/25/2016 with recommendation for a dysphagia 2 diet with nectar thick liquids.        SLP Plan  Continue with current plan of care       Recommendations  Diet recommendations: NPO                Oral Care Recommendations: Oral care QID SLP Visit Diagnosis: Dysphagia, unspecified (R13.10) Plan: Continue with current plan of care       GO                Richard Bennett 04/27/2017, 11:59 AM

## 2017-04-27 NOTE — Plan of Care (Signed)
Patient is slowly progressing overall; more medically stable; however, remains w/very limited ability to communicate needs.  Patient is aphasic; flaccid on right side.  He has remained alert throughout today; able to take ice chips for SLP; continues to require tube feedings.  IR consult placed for PEG placement.  Family at bedside this afternoon to discuss SNF referral/choice for discharge disposition.  Will continue to monitor.

## 2017-04-28 DIAGNOSIS — I69391 Dysphagia following cerebral infarction: Secondary | ICD-10-CM

## 2017-04-28 DIAGNOSIS — R1310 Dysphagia, unspecified: Secondary | ICD-10-CM

## 2017-04-28 DIAGNOSIS — Z515 Encounter for palliative care: Secondary | ICD-10-CM

## 2017-04-28 DIAGNOSIS — Z7189 Other specified counseling: Secondary | ICD-10-CM

## 2017-04-28 LAB — GLUCOSE, CAPILLARY
GLUCOSE-CAPILLARY: 118 mg/dL — AB (ref 65–99)
GLUCOSE-CAPILLARY: 77 mg/dL (ref 65–99)
GLUCOSE-CAPILLARY: 91 mg/dL (ref 65–99)
Glucose-Capillary: 83 mg/dL (ref 65–99)
Glucose-Capillary: 60 mg/dL — ABNORMAL LOW (ref 65–99)
Glucose-Capillary: 140 mg/dL — ABNORMAL HIGH (ref 65–99)
Glucose-Capillary: 141 mg/dL — ABNORMAL HIGH (ref 65–99)

## 2017-04-28 LAB — FERRITIN: FERRITIN: 276 ng/mL (ref 24–336)

## 2017-04-28 LAB — IRON AND TIBC
Iron: 49 ug/dL (ref 45–182)
Saturation Ratios: 17 % — ABNORMAL LOW (ref 17.9–39.5)
TIBC: 286 ug/dL (ref 250–450)
UIBC: 237 ug/dL

## 2017-04-28 LAB — RENAL FUNCTION PANEL
ALBUMIN: 2.1 g/dL — AB (ref 3.5–5.0)
Anion gap: 13 (ref 5–15)
BUN: 109 mg/dL — ABNORMAL HIGH (ref 6–20)
CALCIUM: 8.6 mg/dL — AB (ref 8.9–10.3)
CO2: 25 mmol/L (ref 22–32)
Chloride: 108 mmol/L (ref 101–111)
Creatinine, Ser: 2.63 mg/dL — ABNORMAL HIGH (ref 0.61–1.24)
GFR calc Af Amer: 28 mL/min — ABNORMAL LOW (ref >60)
GFR, EST NON AFRICAN AMERICAN: 24 mL/min — AB (ref >60)
GLUCOSE: 122 mg/dL — AB (ref 65–99)
PHOSPHORUS: 3.9 mg/dL (ref 2.5–4.6)
POTASSIUM: 3.6 mmol/L (ref 3.5–5.1)
SODIUM: 146 mmol/L — AB (ref 135–145)

## 2017-04-28 LAB — RETICULOCYTES
RBC.: 3.05 MIL/uL — AB (ref 4.22–5.81)
RETIC COUNT ABSOLUTE: 27.5 10*3/uL (ref 19.0–186.0)
RETIC CT PCT: 0.9 % (ref 0.4–3.1)

## 2017-04-28 LAB — VITAMIN B12: VITAMIN B 12: 894 pg/mL (ref 180–914)

## 2017-04-28 LAB — PROTIME-INR
INR: 1.09
PROTHROMBIN TIME: 14 s (ref 11.4–15.2)

## 2017-04-28 LAB — FOLATE: Folate: 22.9 ng/mL (ref >5.9)

## 2017-04-28 LAB — MAGNESIUM: MAGNESIUM: 2.7 mg/dL — AB (ref 1.7–2.4)

## 2017-04-28 MED ORDER — DEXTROSE 50 % IV SOLN
INTRAVENOUS | Status: AC
  Administered 2017-04-28: 50 mL
  Filled 2017-04-28: qty 50

## 2017-04-28 MED ORDER — DEXTROSE-NACL 5-0.9 % IV SOLN
INTRAVENOUS | Status: DC
  Administered 2017-04-28 – 2017-04-29 (×3): via INTRAVENOUS

## 2017-04-28 MED ORDER — VITAL AF 1.2 CAL PO LIQD
1000.0000 mL | ORAL | Status: AC
  Administered 2017-04-28: 1000 mL
  Filled 2017-04-28: qty 1000

## 2017-04-28 MED ORDER — VITAL AF 1.2 CAL PO LIQD: 1000.0000 mL | ORAL | Status: DC

## 2017-04-28 MED ORDER — CEFAZOLIN SODIUM-DEXTROSE 2-4 GM/100ML-% IV SOLN
2.0000 g | INTRAVENOUS | Status: AC
  Administered 2017-04-29: 2000 mg via INTRAVENOUS
  Filled 2017-04-28: qty 100

## 2017-04-28 NOTE — Progress Notes (Signed)
Rechecked patients blood sugar this morning due to being NPO and tube feeding off for possible Peg Tube placement in IR today. Blood sugar read 60, so for hypoglycemic protocol patient received 50 gm Dextrose vial. Will continue to monitor closely.

## 2017-04-28 NOTE — Progress Notes (Signed)
Pt open his eyes spontaneous, but not following the commands. Lt. Side flaccid, Rt. Side move actively. Pt's aphasic. Pt was NPO due to PEG placement by IR today, but it will be tomorrow. Pt's brother talked to IR PA by phone consent for PEG placement. Pt was hypoglycemic in the morning. Hold lantus & switched fluid from NS to D5W then glucose was fine. Restart to tube feeding at 1845. HS [employer]

## 2017-04-28 NOTE — Care Management Note (Signed)
Case Management Note  Patient Details  Name: Richard Bennett MRN: [id number] Date of Birth: [birth year]  Subjective/Objective:   Pt admitted with acute enceph and seizures                 Action/Plan:  Pt is from home.  Pt is on the ventilator and now requiring CRRT    Expected Discharge Date:                  Expected Discharge Plan:  Home/Self Care  In-House Referral:     Discharge planning Services  CM Consult  Post Acute Care Choice:    Choice offered to:     DME Arranged:    DME Agency:     HH Arranged:    HH Agency:     Status of Service:     If discussed at [employer], dates discussed:    Additional Comments: 04/28/2017  Pt is now extubated to [place].  Pt will have PEG placed today. Pt discussed in morning rounds - CSW made aware pt is nearing discharge potentially tomorrow   04/24/17 Pt remains on ventilator - does not follow commands.  [place] discussions ongoing - family will likely want trach if needs reintubation   04/18/17 CRRT discontinued, pt has now received 1 HD session Maryclare Labrador, RN 04/28/2017, 11:29 AM

## 2017-04-28 NOTE — Progress Notes (Signed)
Palliative Medicine consult noted. Due to high referral volume, there may be a delay seeing this patient. Please call the Palliative Medicine Team office at [phone] if recommendations are needed in the interim.  Thank you for inviting [place] to see this patient.  Marjie Skiff Alexandra Posadas, RN, BSN, [facility] Palliative Medicine Team 04/28/2017 8:51 AM Office [phone]

## 2017-04-28 NOTE — Progress Notes (Signed)
Recheck of glucose after Dextrose read 140. Patient resting comfortably in bed.

## 2017-04-28 NOTE — NC FL2 (Signed)
[place] LEVEL OF CARE SCREENING TOOL     IDENTIFICATION  Patient Name: Richard Bennett Birthdate: August 27, 1950 Sex: male Admission Date (Current Location): 04/11/2017  [facility] and [place] Number:  Herbalist and Address:  [facility]. [facility] [place] [address], [place], [place] [address]      Provider Number: [id number]  Attending Physician Name and Address:  [name], MD  Relative Name and Phone Number:       Current Level of Care: Hospital Recommended Level of Care: [place] Prior Approval Number:    Date Approved/Denied:   PASRR Number: [id number]  Discharge Plan: SNF    Current Diagnoses: Patient Active Problem List   Diagnosis Date Noted  . Cerebral thrombosis with cerebral infarction 04/17/2017  . Acute respiratory failure with hypoxia ([place])   . Acute encephalopathy   . Hyperosmolar non-ketotic state in patient with type 2 diabetes mellitus ([place]) 04/11/2017  . Status epilepticus ([place]) 04/11/2017  . Irritable bowel syndrome with diarrhea 08/13/2016  . Anemia, chronic disease 08/13/2016  . CKD (chronic kidney disease) stage 3, GFR 30-59 ml/min (HCC) 08/13/2016  . Left carotid stenosis 07/27/2016  . DM (diabetes mellitus), type 2, uncontrolled, periph vascular complic ([place]) 20/25/4270  . Gait disturbance, post-stroke   . Seizure ([place])   . Hypertensive emergency 10/30/2015  . AKI (acute kidney injury) ([place]) 10/30/2015  . Pain in the chest   . Essential hypertension   . Hyperlipidemia     Orientation RESPIRATION BLADDER Height & Weight     (Unable to follow commands)  Normal Continent, Indwelling catheter Weight: 151 lb 0.2 oz (68.5 kg) Height:  5' 8"  (172.7 cm)  BEHAVIORAL SYMPTOMS/MOOD NEUROLOGICAL BOWEL NUTRITION STATUS  (Not interactive) Convulsions/Seizures Incontinent Feeding tube(Peg tube placement today, 2/25)  AMBULATORY STATUS COMMUNICATION OF NEEDS Skin   Total Care Does not  communicate Other (Comment)(Excoriated. Laceration on head with sutures.)                       Personal Care Assistance Level of Assistance  Total care       Total Care Assistance: Maximum assistance   Functional Limitations Info  Sight, Hearing, Speech Sight Info: (Not interactive) Hearing Info: (Not interactive) Speech Info: (Expressive aphasia/mute)    SPECIAL CARE FACTORS FREQUENCY  PT (By licensed PT), OT (By licensed OT)     PT Frequency: 5 x week OT Frequency: 5 x week            Contractures Contractures Info: Not present    Additional Factors Info  Code Status, Allergies Code Status Info: Full Allergies Info: NKDA           Current Medications (04/28/2017):  This is the current hospital active medication list Current Facility-Administered Medications  Medication Dose Route Frequency Provider Last Rate Last Dose  . 0.45 % sodium chloride infusion   Intravenous Continuous Erick Colace, NP   Stopped at 04/25/17 1000  . 0.9 %  sodium chloride infusion  75 mL/hr Intravenous Continuous Allie Bossier, MD 75 mL/hr at 04/28/17 0400 75 mL/hr at 04/28/17 0400  . amLODipine (NORVASC) tablet 10 mg  10 mg Per Tube Daily Diallo, Abdoulaye, MD   10 mg at 04/28/17 1000  . aspirin tablet 325 mg  325 mg Per Tube Daily Collene Gobble, MD   325 mg at 04/28/17 1000  . atorvastatin (LIPITOR) tablet 40 mg  40 mg Per Tube q1800 Byrum,  Rose Fillers, MD   40 mg at 04/27/17 1731  . bisacodyl (DULCOLAX) suppository 10 mg  10 mg Rectal Daily PRN Jennelle Human B, NP   10 mg at 04/15/17 0935  . ceFAZolin (ANCEF) IVPB 2g/100 mL premix  2 g Intravenous to Edward Jolly, PA-C      . chlorhexidine gluconate (MEDLINE KIT) (PERIDEX) 0.12 % solution 15 mL  15 mL Mouth Rinse BID Jennelle Human B, NP   15 mL at 04/28/17 0747  . cloNIDine (CATAPRES) tablet 0.2 mg  0.2 mg Per Tube BID Collene Gobble, MD   0.2 mg at 04/28/17 1000  . docusate (COLACE) 50 MG/5ML liquid 100 mg  100 mg Per  Tube BID PRN Jennelle Human B, NP   100 mg at 04/17/17 1600  . feeding supplement (VITAL AF 1.2 CAL) liquid 1,000 mL  1,000 mL Per Tube Continuous Rigoberto Noel, MD   Stopped at 04/28/17 0000  . folic acid injection 1 mg  1 mg Intravenous Daily Allie Bossier, MD   1 mg at 04/28/17 7353  . heparin injection 1,000 Units  1,000 Units Dialysis PRN Donato Heinz, MD   1,000 Units at 04/17/17 2150  . heparin injection 1,800 Units  20 Units/kg Dialysis PRN Donato Heinz, MD   1,800 Units at 04/17/17 1910  . heparin injection 5,000 Units  5,000 Units Subcutaneous Q8H Brynda Greathouse Sue-Ellen, PA      . hydrALAZINE (APRESOLINE) injection 10-20 mg  10-20 mg Intravenous Q6H PRN Collene Gobble, MD      . insulin aspart (novoLOG) injection 0-20 Units  0-20 Units Subcutaneous Q4H Diallo, Abdoulaye, MD   4 Units at 04/28/17 0052  . insulin glargine (LANTUS) injection 30 Units  30 Units Subcutaneous Daily Rigoberto Noel, MD   30 Units at 04/27/17 [phone]  . labetalol (NORMODYNE) tablet 200 mg  200 mg Per Tube Q8H Erick Colace, NP   200 mg at 04/28/17 0600  . labetalol (NORMODYNE,TRANDATE) injection 5 mg  5 mg Intravenous Q10 min PRN Collene Gobble, MD      . levETIRAcetam (KEPPRA) 100 MG/ML solution 500 mg  500 mg Per Tube BID Susa Raring, RPH   500 mg at 04/28/17 1000  . lidocaine (PF) (XYLOCAINE) 1 % injection 5 mL  5 mL Intradermal PRN Donato Heinz, MD      . lidocaine-prilocaine (EMLA) cream 1 application  1 application Topical PRN Donato Heinz, MD      . LORazepam (ATIVAN) injection 1-2 mg  1-2 mg Intravenous Q2H PRN Allie Bossier, MD      . MEDLINE mouth rinse  15 mL Mouth Rinse q12n4p Simonne Maffucci B, MD   15 mL at 04/27/17 1639  . pantoprazole sodium (PROTONIX) 40 mg/20 mL oral suspension 40 mg  40 mg Per Tube Daily Susa Raring, RPH   40 mg at 04/28/17 1000  . pentafluoroprop-tetrafluoroeth (GEBAUERS) aerosol 1 application  1 application Topical PRN Donato Heinz, MD      . sodium chloride flush (NS) 0.9 % injection 10-40 mL  10-40 mL Intracatheter PRN Simonne Maffucci B, MD      . thiamine (B-1) injection 100 mg  100 mg Intravenous Daily Allie Bossier, MD   100 mg at 04/28/17 4268     Discharge Medications: Please see discharge summary for a list of discharge medications.  Relevant Imaging Results:  Relevant Lab Results:   Additional Information SS#: [id number]. Brother supposed to  go to DSS to apply for Medicaid today.  Candie Chroman, LCSW

## 2017-04-28 NOTE — Consult Note (Signed)
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 of focal opacification, pleural effusion or pneumothorax. The cardiomediastinal silhouette is borderline normal in size. No acute osseous abnormalities are seen. IMPRESSION: No acute cardiopulmonary process seen. Electronically Signed   By: Garald Balding M.D.   On: 04/11/2017 02:07   Dg Abd Portable 1v  Result Date: 04/15/2017 CLINICAL DATA:  Status post OG tube placement. EXAM: PORTABLE ABDOMEN - 1 VIEW COMPARISON:  None. FINDINGS: OG tube is looped in the fundus of the stomach. Both the tip and side-port are in the stomach. Bowel gas pattern is non obstructive. Prominent stool ascending colon noted. IMPRESSION: OG tube in good position.  No acute finding. Electronically Signed   By: Inge Rise M.D.   On: 04/15/2017 13:48    Labs:  CBC: Recent Labs    04/23/17 0448 04/25/17 0411 04/26/17 0714 04/27/17 0234  WBC 10.0 7.9 7.5 8.5  HGB 7.5* 7.4* 8.5* 8.0*  HCT 24.0* 24.0* 27.1* 25.6*  PLT 248 257 279 282    COAGS: Recent Labs    07/23/16 1129 04/11/17 0140 04/23/17 0448 04/28/17 0213  INR 1.04 0.91 1.02 1.09    BMP: Recent Labs    04/25/17 0411 04/26/17 0714  04/27/17 0234 04/28/17 0213  NA 141 141 141 146*  K 4.2 3.7 3.9 3.6  CL 100* 101 105 108  CO2 26 23 23 25   GLUCOSE 215* 171* 178* 122*  BUN 113* 113* 113* 109*  CALCIUM 8.5* 8.7* 8.5* 8.6*  CREATININE 3.25* 3.02* 2.71* 2.63*  GFRNONAA 18* 20* 23* 24*  GFRAA 21* 23* 27* 28*    LIVER FUNCTION TESTS: Recent Labs    07/21/16 2304 07/26/16 1035 07/27/16 0318 04/11/17 0140  04/17/17 1838  04/25/17 0411 04/26/17 0714 04/27/17 0234 04/28/17 0213  BILITOT 0.6 0.2* 0.4 0.5  --   --   --   --   --   --   --   AST 33 34 33 52*  --   --   --   --   --   --   --  ALT 25 23 24 23   --  21  --   --   --   --   --   ALKPHOS 80 74 69 119  --   --   --   --   --   --   --   PROT 6.8 6.0* 6.0* 5.6*  --   --   --   --   --   --   --   ALBUMIN 3.3* 2.5* 2.6* 2.8*   < >  --    < > 2.1* 2.2* 2.2* 2.1*   < > = values in this interval not displayed.    TUMOR MARKERS: No results for input(s): AFPTM, CEA, CA199, CHROMGRNA in the last 8760 hours.  Assessment and Plan:  CVA; dysphagia Failed swallow studies Deconditioning Encephalopathic Scheduled for perc Gastric tube placement Risks and benefits discussed with the patient's brother via phone including, but not limited to the need for a barium enema during the procedure, bleeding, infection, peritonitis, or damage to adjacent structures.  All of the his questions were answered, he is agreeable to proceed. Consent signed and in chart.   Thank you for this interesting consult.  I greatly enjoyed meeting Levaughn Puccinelli and look forward to participating in their care.  A copy of this report was sent to the requesting provider on this date.  Electronically Signed: Lavonia Drafts, PA-C 04/28/2017, 8:48 AM   I spent a total of 40 Minutes    in face to face in clinical consultation, greater than 50% of which was counseling/coordinating care for perc G tube

## 2017-04-28 NOTE — Clinical Social Work Note (Signed)
CSW left voicemail for patient's sister-in-law to follow up on yesterday's conversation with weekend social worker regarding applying for Medicaid today. Patient has only two bed offers so far: [facility].  Dayton Scrape, [place]

## 2017-04-28 NOTE — Progress Notes (Signed)
  Speech Language Pathology Treatment: Dysphagia  Patient Details Name: Richard Bennett MRN: [id number] DOB: 02/21/1951 Today's Date: 04/28/2017 Time: 1215-1225 SLP Time Calculation (min) (ACUTE ONLY): 10 min  Assessment / Plan / Recommendation Clinical Impression  Pt's performance consistent with yesterday's: not following commands, makes eye contact with clinician. Does not anticipate approaching utensils to mouth, but actively manipulated ice chips/water and initiated consistent swallow response.  This is following by wet voice, throat clearing.  Pt requires max verbal/tactile cues for participation.  Scheduled for PEG later this afternoon - prognosis for eventual PO intake is good; diet may require modifications.  Pt will be ready for an MBS either prior to D/C or as an OP after he transitions to SNF.  SLP will follow.   HPI HPI: 63yoM with hx CVA, DM, HTN, presents to the ER today with acute encephalopathy and seizures. Initially patient was wandering down hallway of apartment and police were called. EMS arrived to find patient in a bathtub initially unresponsive then combative. He had a seizure during this process and was given versed 5mg . Patient transported to [facility] ER where blood glucose 715 initially and patient with acute encephalopathy. Patient had a head lac that was sutured in the ER. He had a 2nd seizure in the ER. He continued to be altered combative following this seizure, repeatedly trying to climb out of bed. He received a total of 9mg  Versed and 4mg  Ativan while in the ER. He continued to be agitated and was intubated by ER staff. He was finally extubated on 04/25/2017 after a 14 day intubation.   Most recent MRI dated 04/16/2017 is showing new acute infarcts bilaterally left greater then right that appear to be watershed infarcts.  Chest xray dated 04/25/2017 is showing no acute cardiopulmonary issues.  Patient is known to [facility] service from previous admission with swallowing evaluation  dated 07/25/2016 with recommendation for a dysphagia 2 diet with nectar thick liquids.        SLP Plan  Continue with current plan of care       Recommendations  Diet recommendations: NPO Medication Administration: Via alternative means                Oral Care Recommendations: Oral care QID Follow up Recommendations: Skilled Nursing facility SLP Visit Diagnosis: Dysphagia, unspecified (R13.10) Plan: Continue with current plan of care       GO                Richard Bennett 04/28/2017, 12:26 PM

## 2017-04-28 NOTE — Progress Notes (Signed)
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 EMS was called and the patient was given Versed.   Significant Events: 2/08Admit withacute encephalopathy, seizures,hyperglycemia, and HTN emergency - intubated 2/09 Started on Dilantin per Neurology  2/10Sedated - right hemiparesis > MRI - foley placed by Urology (urethral stricture) 2/14Hemodialysis 2/15 Tolerating CPAP/PS but not following commands 2/20Had to be placed back on vent support, still not following commands 2/22 Extubated   Subjective: Eyes are open - follows examiner to some extent - does not follow simple commands - is unable to speak.  Does not appear uncomfortable or in resp distress.    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  Secondary to cocaine use - has stabilized - not expected to improve significantly beyond his present state   Dysphagia SLP following - remains NPO - for PEG tube per IR   Acute hypoxic respiratory failure in setting of seizure activity  Resolved   Acute encephalopathy Remains noncommunicative - suspect he suffered significant anoxic injury due to his seizure and strokes    Seizures Appear controlled for now   Polysubstance abuse  positive for cocaine and benzos (given by EMS) at time of admit   Anemia No signs of bleeding - most c/w ACD  DM2 A1c 11.8 04/17/17 - CBG currently trending down as TF on hold for PEG - stop lantus and initiate D5 IVF    DVT prophylaxis: SQ heparin  Code Status: FULL CODE Family Communication: no family present at time of exam  Disposition Plan: SDU   Consultants:  Neurology   PCCM  Antimicrobials:  None    Objective: Blood pressure (!) 141/71, pulse 61, temperature (!) 97.3 F (36.3 C), temperature source Axillary, resp. rate 15, height 5' 8"  (1.727 m), weight 68.5 kg (151 lb 0.2 oz), SpO2 100 %.  Intake/Output Summary (Last 24 hours) at 04/28/2017 1314 Last data filed at 04/28/2017 1227 Gross per 24 hour  Intake 3347.25 ml  Output 1975 ml  Net 1372.25 ml   Filed Weights   04/26/17 0500 04/27/17 0700 04/28/17 0353  Weight: 73.3 kg (161 lb 9.6 oz) 72.7 kg (160 lb 4.4 oz) 68.5 kg (151 lb 0.2 oz)    Examination: General: No acute respiratory distress Lungs: Clear to auscultation bilaterally without wheezes or crackles Cardiovascular: Regular rate and rhythm without murmur gallop or rub normal S1 and S2 Abdomen: Nontender, nondistended, soft, bowel sounds positive, no rebound, no ascites, no appreciable mass Extremities: No significant cyanosis, clubbing, or edema bilateral lower extremities  CBC: Recent Labs  Lab 04/22/17 0434 04/23/17 0448 04/25/17 0411 04/26/17 0714 04/27/17 0234  WBC 7.5 10.0 7.9 7.5 8.5  HGB 7.6* 7.5* 7.4* 8.5* 8.0*  HCT 23.7* 24.0* 24.0* 27.1* 25.6*  MCV 83.5 83.9 85.1 86.0 85.9  PLT 242 248 257 279 027   Basic Metabolic Panel: Recent Labs  Lab 04/23/17 0448 04/24/17 0511 04/25/17 0411 04/26/17 0714 04/27/17 0234 04/28/17 0213  NA 140 140 141 141 141 146*  K 4.1 3.6 4.2 3.7 3.9 3.6  CL 97* 98* 100* 101 105 108  CO2 28 27 26 23 23 25   GLUCOSE 197*  146* 215* 171* 178* 122*  BUN 106* 106* 113* 113* 113* 109*  CREATININE 3.31* 3.31* 3.25* 3.02* 2.71* 2.63*  CALCIUM 8.5* 8.5* 8.5* 8.7* 8.5* 8.6*  MG 2.3  --  2.6*  --   --  2.7*  PHOS 5.1* 5.2* 5.2* 5.5* 4.7* 3.9   GFR: Estimated Creatinine Clearance: 26.7 mL/min (A) (by C-G formula based on SCr of 2.63 mg/dL (H)).  Liver Function Tests: Recent Labs  Lab 04/24/17 0511 04/25/17 0411 04/26/17 0714 04/27/17 0234 04/28/17 0213  ALBUMIN 2.0* 2.1* 2.2* 2.2*  2.1*    Coagulation Profile: Recent Labs  Lab 04/23/17 0448 04/28/17 0213  INR 1.02 1.09    HbA1C: Hgb A1c MFr Bld  Date/Time Value Ref Range Status  04/17/2017 08:30 AM 11.8 (H) 4.8 - 5.6 % Final    Comment:    (NOTE) Pre diabetes:          5.7%-6.4% Diabetes:              >6.4% Glycemic control for   <7.0% adults with diabetes   07/25/2016 04:38 AM 11.9 (H) 4.8 - 5.6 % Final    Comment:    (NOTE)         Pre-diabetes: 5.7 - 6.4         Diabetes: >6.4         Glycemic control for adults with diabetes: <7.0     CBG: Recent Labs  Lab 04/28/17 0334 04/28/17 0557 04/28/17 0641 04/28/17 0815 04/28/17 1206  GLUCAP 83 60* 140* 118* 77     Scheduled Meds: . amLODipine  10 mg Per Tube Daily  . aspirin  325 mg Per Tube Daily  . atorvastatin  40 mg Per Tube q1800  . chlorhexidine gluconate (MEDLINE KIT)  15 mL Mouth Rinse BID  . cloNIDine  0.2 mg Per Tube BID  . folic acid  1 mg Intravenous Daily  . heparin  5,000 Units Subcutaneous Q8H  . insulin aspart  0-20 Units Subcutaneous Q4H  . insulin glargine  30 Units Subcutaneous Daily  . labetalol  200 mg Per Tube Q8H  . levETIRAcetam  500 mg Per Tube BID  . mouth rinse  15 mL Mouth Rinse q12n4p  . pantoprazole sodium  40 mg Per Tube Daily  . thiamine  100 mg Intravenous Daily     LOS: 17 days   [name], MD [facility] Office  [phone] Pager - Text Page per Amion as per below:  On-Call/Text Page:      [email or web address]      password [email or web address]  If 7PM-7AM, please contact night-coverage [email or web address] Password [email or web address] 04/28/2017, 1:14 PM

## 2017-04-28 NOTE — Consult Note (Signed)
Consultation Note Date: 04/28/2017   Patient Name: Richard Bennett  DOB: Jun 13, 1950  MRN: [id number]  Age / Sex: 67 y.o., male  PCP: [name], MD Referring Physician: [name], MD  Reason for Consultation: Establishing goals of care  HPI/Patient Profile: 67 y.o. male  with past medical history of previous L MCA stroke, HTN, DM2, HLD, chest pain, colon polyps, refusal of blood product transfusion d/t Jehovah Witness, h/o substance abuse admitted on 04/11/2017 with seizures r/t new bilateral watershed infarcts L>R s/t bilateral carotid stenosis. Also with HTN emergency and nonketotic hyperglycemia. Of note + cocaine and + benzos at admission. Hospitalization complicated by need for ventilator, dialysis short term, and continued severe dysphagia and aphasia.   Clinical Assessment and Goals of Care: I met today with Mr. Richard Bennett. He is alert but continues to be aphasic. He makes eye contact but struggles to hold eye contact. Unable to follow commands EXCEPT he did keep his eyes shut when I requested and then opened when I gave permission to open them back. Unable to get any communication with blinks today. He does move his left arm nonpurposeful but did not witness any other movements.   I called and spoke with brothers Richard Bennett and [place] who were together and placed me on speaker phone. We reviewed his recent medical history focusing on the severity of his stroke effecting his ability to speak and swallow. Richard Bennett shares that he believes that he has seen improvement from [name] the last few days with better eye contact and more extremity movement as well as increased alertness. I agreed that I did witness these behaviors although I have nothing to compare as this is the first day I have seen him. They understand the plan for PEG and are working on obtaining Medicaid and working with CSW to have him placed  in a facility where he can receive appropriate care long term.   I focused my conversation on their expectations and QOL for Mr. Richard Bennett. They share that they are hopeful that he will learn to walk again and become closer to his preadmission baseline. I did explain that my understanding is that the severity of his stroke carries poor prognosis of much improvement from his current state. I then encouraged them to consider his QOL over the next weeks to months and if he continues to not be able to speak and/or eat what kind of life this is for their brother. I reiterated that with this poor QOL we should consider the appropriateness of interventions that we provide for him (such as more "tubes, lines, machines"). They say they understand but remain hopeful. They do not seem to fully grasp the severity of his condition and poor prognosis for recovery but I feel that this is going to take time for them to see for themselves at this point.   All questions/concerns addressed. Family expresses gratitude for all the care and communication they have received for their brother. I would recommend that palliative follow along at SNF when he is  placed - please recommend this in d/c summary. Will shadow while he remains inpatient for further decline or opportunity for reengagement with family for [place].   Primary Decision Maker Legally his estranged son in [place], although brothers Richard Bennett (local) and Richard Bennett (in town from [place]) have been driving decisions and communicating with son. No documented HCPOA.     SUMMARY OF RECOMMENDATIONS   - Recommend outpatient palliative to follow at SNF - Anticipate GOC will be more long term  Code Status/Advance Care Planning:  Full code   Symptom Management:   Per primary  Palliative Prophylaxis:   Aspiration, Bowel Regimen, Delirium Protocol, Frequent Pain Assessment, Oral Care and Turn Reposition  Additional Recommendations (Limitations, Scope,  Preferences):  Full Scope Treatment  Psycho-social/Spiritual:   Desire for further Chaplaincy support:no  Additional Recommendations: Medicaid/Financial Assistance  Prognosis:   Prognosis is very poor from severe neurological injury from stroke. High risk for acute decompensation r/t infection, sepsis, wounds from immobility, and aspiration.   Discharge Planning: [facility] for rehab with Palliative care service follow-up      Primary Diagnoses: Present on Admission: . Hyperosmolar non-ketotic state in patient with type 2 diabetes mellitus ([facility]) . Hypertensive emergency . Status epilepticus ([place]) . CKD (chronic kidney disease) stage 3, GFR 30-59 ml/min (HCC)   I have reviewed the medical record, interviewed the patient and family, and examined the patient. The following aspects are pertinent.  Past Medical History:  Diagnosis Date  . Chest pain   . Colon polyps    adenomatous  . Hyperlipidemia   . Hypertension   . Refusal of blood transfusions as patient is Jehovah's Witness   . Stroke ([facility])   . Type II diabetes mellitus ([place])    Social History   Socioeconomic History  . Marital status: Divorced    Spouse name: None  . Number of children: 4  . Years of education: 61  . Highest education level: None  Social Needs  . Financial resource strain: None  . Food insecurity - worry: None  . Food insecurity - inability: None  . Transportation needs - medical: None  . Transportation needs - non-medical: None  Occupational History  . Occupation: Truck - Local  Tobacco Use  . Smoking status: Former Smoker    Packs/day: 0.50    Years: 20.00    Pack years: 10.00    Types: Cigarettes    Last attempt to quit: 03/04/1978    Years since quitting: 39.1  . Smokeless tobacco: Never Used  Substance and Sexual Activity  . Alcohol use: Yes    Comment: 10/30/2015 "might have 2-3 drinks/year"  . Drug use: Yes    Types: Heroin    Comment: 10/30/2015 "nothing in the  2000s"  . Sexual activity: No  Other Topics Concern  . None  Social History Narrative   Fun: Cycling    Lives alone.  Is retired. Education 12 th grade.  Single.  Children - 4.  He is Jehovah witness.    Family History  Problem Relation Age of Onset  . Diabetes Mother   . Stroke Mother   . Bennett Father   . Diabetes Brother   . Seizures Brother   . Colon Bennett Neg Hx   . Esophageal Bennett Neg Hx   . Stomach Bennett Neg Hx   . Rectal Bennett Neg Hx    Scheduled Meds: . amLODipine  10 mg Per Tube Daily  . aspirin  325 mg Per Tube Daily  .  atorvastatin  40 mg Per Tube q1800  . chlorhexidine gluconate (MEDLINE KIT)  15 mL Mouth Rinse BID  . cloNIDine  0.2 mg Per Tube BID  . folic acid  1 mg Intravenous Daily  . heparin  5,000 Units Subcutaneous Q8H  . insulin aspart  0-20 Units Subcutaneous Q4H  . insulin glargine  30 Units Subcutaneous Daily  . labetalol  200 mg Per Tube Q8H  . levETIRAcetam  500 mg Per Tube BID  . mouth rinse  15 mL Mouth Rinse q12n4p  . pantoprazole sodium  40 mg Per Tube Daily  . thiamine  100 mg Intravenous Daily   Continuous Infusions: .  ceFAZolin (ANCEF) IV    . dextrose 5 % and 0.9% NaCl    . feeding supplement (VITAL AF 1.2 CAL) Stopped (04/28/17 0000)   PRN Meds:.bisacodyl, docusate, heparin, heparin, hydrALAZINE, labetalol, lidocaine (PF), lidocaine-prilocaine, LORazepam, pentafluoroprop-tetrafluoroeth, sodium chloride flush No Known Allergies Review of Systems  Unable to perform ROS: Acuity of condition    Physical Exam  Constitutional: He appears well-developed.  HENT:  Head: Normocephalic and atraumatic.  Cardiovascular: Normal rate and regular rhythm.  Pulmonary/Chest: Effort normal and breath sounds normal. No accessory muscle usage. No tachypnea. No respiratory distress.  Abdominal: Soft. Normal appearance.  Neurological: He is alert. He is disoriented.  Aphasic, will track with eyes at times  Nursing note and vitals  reviewed.   Vital Signs: BP (!) 141/71 (BP Location: Left Arm)   Pulse 61   Temp (!) 97.3 F (36.3 C) (Axillary)   Resp 15   Ht _0  (1.727 m)   Wt 68.5 kg (151 lb 0.2 oz)   SpO2 100%   BMI 22.96 kg/m  Pain Assessment: Faces   Pain Score: 0-No pain   SpO2: SpO2: 100 % O2 Device:SpO2: 100 % O2 Flow Rate: .O2 Flow Rate (L/min): 2 L/min  IO: Intake/output summary:   Intake/Output Summary (Last 24 hours) at 04/28/2017 1239 Last data filed at 04/28/2017 9892 Gross per 24 hour  Intake 2713.5 ml  Output 1975 ml  Net 738.5 ml    LBM: Last BM Date: 04/27/17 Baseline Weight: Weight: 74.4 kg (164 lb) Most recent weight: Weight: 68.5 kg (151 lb 0.2 oz)     Palliative Assessment/Data: 20%     Time Total: 60 min  Greater than 50%  of this time was spent counseling and coordinating care related to the above assessment and plan.  Signed by: Vinie Sill, NP Palliative Medicine Team Pager # [phone] (M-F 8a-5p) Team Phone # [phone] (Nights/Weekends)

## 2017-04-28 NOTE — Progress Notes (Signed)
MEDICATION RELATED CONSULT NOTE - FOLLOW UP   Pharmacy Consult for Antiepilectics Indication: seizures  No Known Allergies  Patient Measurements: Height: 5\' 8"  (172.7 cm) Weight: 151 lb 0.2 oz (68.5 kg) IBW/kg (Calculated) : 68.4 Adjusted Body Weight:   Vital Signs: Temp: 97.5 F (36.4 C) (02/25 0710) Temp Source: Oral (02/25 0710) BP: 136/70 (02/25 0710) Pulse Rate: 62 (02/25 0600) Intake/Output from previous day: 02/24 0701 - 02/25 0700 In: 2743.5 [I.V.:1047.5; NG/GT:1696] Out: 2075 [Urine:2075] Intake/Output from this shift: Total I/O In: 0  Out: 200 [Urine:200]  Labs: Recent Labs    04/26/17 0714 04/27/17 0234 04/28/17 0213  WBC 7.5 8.5  --   HGB 8.5* 8.0*  --   HCT 27.1* 25.6*  --   PLT 279 282  --   CREATININE 3.02* 2.71* 2.63*  MG  --   --  2.7*  PHOS 5.5* 4.7* 3.9  ALBUMIN 2.2* 2.2* 2.1*   Estimated Creatinine Clearance: 26.7 mL/min (A) (by C-G formula based on SCr of 2.63 mg/dL (H)).   Assessment: CC/HPI: encephalopathy and seizures, non-ketotic hyperglycemia, HTN emergency  PMH: hx CVA, DM, HTN  Neuro: Seizures (no meds for seizures PTA)- Keppra 500  q12, Vimpat 500  stopped  Thiamine/FA -2/8 MRI - new small, bilateral subdural CSF hygromas (33mm each)  Goal of Therapy:  Control of seizure activity  Plan:  Keppra 500mg /tube BID No drug interactions noted. Dose ok for renal function Pharmacy will sign off. Please reconsult for further dosing assitance.   Richard Bennett, PharmD, BCPS Clinical Staff Pharmacist Pager [phone]  Richard Bennett 04/28/2017,10:34 AM

## 2017-04-28 NOTE — Clinical Social Work Note (Signed)
Patient has no bed offers. FL2 completed and faxed out.  Dayton Scrape, [place]

## 2017-04-29 ENCOUNTER — Inpatient Hospital Stay (HOSPITAL_COMMUNITY): Payer: Medicare HMO

## 2017-04-29 ENCOUNTER — Encounter (HOSPITAL_COMMUNITY): Payer: Self-pay | Admitting: Interventional Radiology

## 2017-04-29 HISTORY — PX: IR GASTROSTOMY TUBE MOD SED: IMG625

## 2017-04-29 LAB — CBC
HCT: 27.1 % — ABNORMAL LOW (ref 39.0–52.0)
Hemoglobin: 8.4 g/dL — ABNORMAL LOW (ref 13.0–17.0)
MCH: 27.2 pg (ref 26.0–34.0)
MCHC: 31 g/dL (ref 30.0–36.0)
MCV: 87.7 fL (ref 78.0–100.0)
Platelets: 294 10*3/uL (ref 150–400)
RBC: 3.09 MIL/uL — ABNORMAL LOW (ref 4.22–5.81)
RDW: 14.1 % (ref 11.5–15.5)
WBC: 6.5 10*3/uL (ref 4.0–10.5)

## 2017-04-29 LAB — GLUCOSE, CAPILLARY
GLUCOSE-CAPILLARY: 127 mg/dL — AB (ref 65–99)
GLUCOSE-CAPILLARY: 140 mg/dL — AB (ref 65–99)
GLUCOSE-CAPILLARY: 213 mg/dL — AB (ref 65–99)
Glucose-Capillary: 114 mg/dL — ABNORMAL HIGH (ref 65–99)
Glucose-Capillary: 135 mg/dL — ABNORMAL HIGH (ref 65–99)
Glucose-Capillary: 91 mg/dL (ref 65–99)

## 2017-04-29 LAB — BASIC METABOLIC PANEL
Anion gap: 11 (ref 5–15)
BUN: 90 mg/dL — ABNORMAL HIGH (ref 6–20)
CALCIUM: 8.6 mg/dL — AB (ref 8.9–10.3)
CO2: 22 mmol/L (ref 22–32)
Chloride: 117 mmol/L — ABNORMAL HIGH (ref 101–111)
Creatinine, Ser: 2.34 mg/dL — ABNORMAL HIGH (ref 0.61–1.24)
GFR calc non Af Amer: 27 mL/min — ABNORMAL LOW (ref >60)
GFR, EST AFRICAN AMERICAN: 32 mL/min — AB (ref >60)
GLUCOSE: 137 mg/dL — AB (ref 65–99)
Potassium: 3.7 mmol/L (ref 3.5–5.1)
Sodium: 150 mmol/L — ABNORMAL HIGH (ref 135–145)

## 2017-04-29 MED ORDER — MIDAZOLAM HCL 2 MG/2ML IJ SOLN
INTRAMUSCULAR | Status: AC
  Filled 2017-04-29: qty 4

## 2017-04-29 MED ORDER — VITAMIN B-1 100 MG PO TABS
100.0000 mg | ORAL_TABLET | Freq: Every day | ORAL | Status: DC
  Administered 2017-04-30 – 2017-05-13 (×14): 100 mg
  Filled 2017-04-29 (×14): qty 1

## 2017-04-29 MED ORDER — LIDOCAINE HCL 1 % IJ SOLN
INTRAMUSCULAR | Status: AC | PRN
  Administered 2017-04-29: 8 mL

## 2017-04-29 MED ORDER — IOPAMIDOL (ISOVUE-300) INJECTION 61%
INTRAVENOUS | Status: AC
  Administered 2017-04-29: 10 mL
  Filled 2017-04-29: qty 50

## 2017-04-29 MED ORDER — FOLIC ACID 1 MG PO TABS
1.0000 mg | ORAL_TABLET | Freq: Every day | ORAL | Status: DC
  Administered 2017-04-30 – 2017-05-13 (×14): 1 mg
  Filled 2017-04-29 (×14): qty 1

## 2017-04-29 MED ORDER — CEFAZOLIN SODIUM-DEXTROSE 2-4 GM/100ML-% IV SOLN
INTRAVENOUS | Status: AC
  Administered 2017-04-29: 2000 mg via INTRAVENOUS
  Filled 2017-04-29: qty 100

## 2017-04-29 MED ORDER — FENTANYL CITRATE (PF) 100 MCG/2ML IJ SOLN
INTRAMUSCULAR | Status: AC | PRN
  Administered 2017-04-29: 50 ug via INTRAVENOUS

## 2017-04-29 MED ORDER — GLUCAGON HCL RDNA (DIAGNOSTIC) 1 MG IJ SOLR
INTRAMUSCULAR | Status: AC
  Filled 2017-04-29: qty 1

## 2017-04-29 MED ORDER — MIDAZOLAM HCL 2 MG/2ML IJ SOLN
INTRAMUSCULAR | Status: AC | PRN
  Administered 2017-04-29: 1 mg via INTRAVENOUS

## 2017-04-29 MED ORDER — LIDOCAINE HCL 1 % IJ SOLN
INTRAMUSCULAR | Status: AC
  Filled 2017-04-29: qty 20

## 2017-04-29 MED ORDER — BACITRACIN-NEOMYCIN-POLYMYXIN 400-5-5000 EX OINT
1.0000 "application " | TOPICAL_OINTMENT | Freq: Every day | CUTANEOUS | Status: DC
  Filled 2017-04-29 (×2): qty 1

## 2017-04-29 MED ORDER — HEPARIN SODIUM (PORCINE) 5000 UNIT/ML IJ SOLN
5000.0000 [IU] | Freq: Three times a day (TID) | INTRAMUSCULAR | Status: DC
  Administered 2017-04-30 – 2017-05-03 (×7): 5000 [IU] via SUBCUTANEOUS
  Filled 2017-04-29 (×7): qty 1

## 2017-04-29 MED ORDER — FENTANYL CITRATE (PF) 100 MCG/2ML IJ SOLN
INTRAMUSCULAR | Status: AC
  Filled 2017-04-29: qty 4

## 2017-04-29 NOTE — Sedation Documentation (Signed)
Pt Dr. Earleen Newport, pt's airway is rated a two.

## 2017-04-29 NOTE — Clinical Social Work Note (Addendum)
CSW received voicemail from patient's sister-in-law last night. She stated that patient's brothers had gone to DSS yesterday to begin Medicaid process. She asked about patient's appropriateness for CIR but said if he cannot go there, [facility] and [facility] are top preferences for SNF placement. [facility] declined, [facility] is still pending. CSW left messages for admissions coordinator to see if they can review the referral. Patient was discussed in quality collaborative meeting this morning. Medical director for RNCM/CSW depts has approved a 30-day LOG while Medicaid is pending. [facility] and [facility] have extended bed offers. [facility] rescinded their bed offer. CSW left voicemail for patient's brother. Will discuss when he calls back.  Dayton Scrape, CSW [phone]  1:53 pm Patients brothers at bedside. Patient's Medicaid worker, Daphene Calamity [phone]) is requesting FL2 for Medicaid application. CSW left voicemail for Ms. Ellerbe. Will fax over once she calls back. Bed offers provided. Patient's brother, Ruthann Cancer, is familiar with [facility]. Clinical liaison will meet with brothers to provide more information on the facility. Brother are also requesting to speak with MD. CSW paged MD to notify. Brothers asking about HCPOA. CSW explained that typically chaplains complete that paperwork in the hospital when patient is fully alert, oriented, and agreeable. Since none of that is the case right now, they may have to seek out a lawyer for that. CSW provided advanced directives/HCPOA paperwork for them to review.   Dayton Scrape, [name]

## 2017-04-29 NOTE — Sedation Documentation (Signed)
Patient is resting comfortably. 

## 2017-04-29 NOTE — Progress Notes (Signed)
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 EMS was called and the patient was given Versed.   Significant Events: 2/08Admit withacute encephalopathy, seizures,hyperglycemia, and HTN emergency - intubated 2/09 Started on Dilantin per Neurology  2/10Sedated - right hemiparesis > MRI - foley placed by Urology (urethral stricture) 2/14Hemodialysis 2/15 Tolerating CPAP/PS but not following commands 2/20Had to be placed back on vent support, still not following commands 2/22 Extubated   Subjective: Pt remains non-communicative.  He does not follow simple commands.  His eyes are open, but he does not track the examiner in the room.  He is in no apparent resp distress or uncontrolled pain.    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 due to his seizure and strokes - minimizing all sedating meds in a stepwise fashion   Seizures Appear controlled for now   Cocaine abuse  UDS positive for cocaine and benzos (given by EMS) at time of admit   Anemia No signs of bleeding - most c/w ACD  DM2 A1c 11.8 04/17/17 - CBG currently controlled - follow w/o change    DVT prophylaxis: SQ heparin  Code Status: FULL CODE Family Communication: no  family present at time of exam  Disposition Plan: SDU   Consultants:  Neurology  PCCM  Antimicrobials:  None    Objective: Blood pressure (!) 164/71, pulse (!) 59, temperature (!) 97.3 F (36.3 C), temperature source Oral, resp. rate 15, height _0  (1.727 m), weight 71.1 kg (156 lb 12 oz), SpO2 100 %.  Intake/Output Summary (Last 24 hours) at 04/29/2017 1148 Last data filed at 04/29/2017 1134 Gross per 24 hour  Intake 1858 ml  Output 2000 ml  Net -142 ml   Filed Weights   04/27/17 0700 04/28/17 0353 04/29/17 0334  Weight: 72.7 kg (160 lb 4.4 oz) 68.5 kg (151 lb 0.2 oz) 71.1 kg (156 lb 12 oz)    Examination: General: No acute respiratory distress Lungs: Clear to auscultation bilaterally  Cardiovascular: RRR Abdomen: NT/ND, soft, bs+, no mass  Extremities: No signif edema B LE   CBC: Recent Labs  Lab 04/23/17 0448 04/25/17 0411 04/26/17 0714 04/27/17 0234 04/29/17 0242  WBC 10.0 7.9 7.5 8.5 6.5  HGB 7.5* 7.4* 8.5* 8.0* 8.4*  HCT 24.0* 24.0* 27.1* 25.6* 27.1*  MCV 83.9 85.1 86.0 85.9 87.7  PLT 248 257 279 282 468   Basic Metabolic Panel: Recent Labs  Lab 04/23/17 0448 04/24/17 0511 04/25/17 0411 04/26/17 0714 04/27/17 0234 04/28/17 0213 04/29/17 0242  NA 140 140 141 141 141 146* 150*  K 4.1 3.6 4.2 3.7 3.9 3.6 3.7  CL 97* 98* 100* 101 105 108 117*  CO2 _1 GLUCOSE 197* 146*  215* 171* 178* 122* 137*  BUN 106* 106* 113* 113* 113* 109* 90*  CREATININE 3.31* 3.31* 3.25* 3.02* 2.71* 2.63* 2.34*  CALCIUM 8.5* 8.5* 8.5* 8.7* 8.5* 8.6* 8.6*  MG 2.3  --  2.6*  --   --  2.7*  --   PHOS 5.1* 5.2* 5.2* 5.5* 4.7* 3.9  --    GFR: Estimated Creatinine Clearance: 30 mL/min (A) (by C-G formula based on SCr of 2.34 mg/dL (H)).  Liver Function Tests: Recent Labs  Lab 04/24/17 0511 04/25/17 0411 04/26/17 0714 04/27/17 0234 04/28/17 0213  ALBUMIN 2.0* 2.1* 2.2* 2.2* 2.1*    Coagulation Profile: Recent Labs  Lab 04/23/17 0448  04/28/17 0213  INR 1.02 1.09    HbA1C: Hgb A1c MFr Bld  Date/Time Value Ref Range Status  04/17/2017 08:30 AM 11.8 (H) 4.8 - 5.6 % Final    Comment:    (NOTE) Pre diabetes:          5.7%-6.4% Diabetes:              >6.4% Glycemic control for   <7.0% adults with diabetes   07/25/2016 04:38 AM 11.9 (H) 4.8 - 5.6 % Final    Comment:    (NOTE)         Pre-diabetes: 5.7 - 6.4         Diabetes: >6.4         Glycemic control for adults with diabetes: <7.0     CBG: Recent Labs  Lab 04/28/17 1955 04/28/17 2342 04/29/17 0408 04/29/17 0832 04/29/17 1132  GLUCAP 141* 213* 114* 135* 140*     Scheduled Meds: . amLODipine  10 mg Per Tube Daily  . aspirin  325 mg Per Tube Daily  . atorvastatin  40 mg Per Tube q1800  . chlorhexidine gluconate (MEDLINE KIT)  15 mL Mouth Rinse BID  . cloNIDine  0.2 mg Per Tube BID  . folic acid  1 mg Intravenous Daily  . [START ON 04/30/2017] heparin  5,000 Units Subcutaneous Q8H  . insulin aspart  0-20 Units Subcutaneous Q4H  . labetalol  200 mg Per Tube Q8H  . levETIRAcetam  500 mg Per Tube BID  . mouth rinse  15 mL Mouth Rinse q12n4p  . pantoprazole sodium  40 mg Per Tube Daily  . thiamine  100 mg Intravenous Daily     LOS: 18 days   [name], MD [facility] Office  [phone] Pager - Text Page per Amion as per below:  On-Call/Text Page:      [email or web address]      password [email or web address]  If 7PM-7AM, please contact night-coverage [email or web address] Password [email or web address] 04/29/2017, 11:48 AM

## 2017-04-29 NOTE — Progress Notes (Addendum)
Nutrition Follow-up  DOCUMENTATION CODES:   Not applicable  INTERVENTION:   -Once PEG is placed and ready to use, initiate:  Jevity 1.5 @ 20 ml/hr via PEG and increase by 10 ml every 4 hours to goal rate of 50 ml/hr.   30 ml Prostat daily.    Tube feeding regimen provides 1900 kcal (100% of needs), 92 grams of protein, and 912 ml of H2O.   NUTRITION DIAGNOSIS:   Inadequate oral intake related to inability to eat as evidenced by NPO status.  Ongoing  GOAL:   Patient will meet greater than or equal to 90% of their needs  Progressing  MONITOR:   TF tolerance, Diet advancement, Labs, I & O's  REASON FOR ASSESSMENT:   Ventilator, Consult(verbal) Enteral/tube feeding initiation and management  ASSESSMENT:   67 yo male with PMH of HTN, HLD, Jehovah's witness, DM-2, stroke who was admitted on 2/8 with seizures, HTN emergency, nonketotic hyperglycemia. Required intubation on admission.  2/23- s/p BSE, recommended continued NPO status, transferred from ICU to SDU  Pt unable to communicate with this RD. Noted TF off when in room. Case discussed with RN, who reports TF on hold due to PEG placement scheduled for later this afternoon.   Palliative care continues to follow for goals of care discussions. CSW following for SNF placement.   Labs reviewed: Na: 150, CBGS: 114-213 (inpatient orders for glycemic control are 0-20 units insulin aspart every 4 hours).   Diet Order:  Seizure precautions Diet NPO time specified Except for: Sips with Meds  EDUCATION NEEDS:   No education needs have been identified at this time  Skin:  Skin Assessment: Skin Integrity Issues: Skin Integrity Issues:: Other (Comment) Other: laceration to head  Last BM:  04/28/17  Height:   Ht Readings from Last 1 Encounters:  04/11/17 5\' 8"  (1.727 m)    Weight:   Wt Readings from Last 1 Encounters:  04/29/17 156 lb 12 oz (71.1 kg)    Ideal Body Weight:  70 kg  BMI:  Body mass index is 23.83  kg/m.  Estimated Nutritional Needs:   Kcal:  1800-2000  Protein:  90-100 gm  Fluid:  1.8-2 L    Remo Kirschenmann A. Jimmye Norman, RD, LDN, CDE Pager: [phone] After hours Pager: [phone]

## 2017-04-29 NOTE — Procedures (Signed)
Interventional Radiology Procedure Note  Procedure: Placement of percutaneous 20F pull-through gastrostomy tube. Complications: None Recommendations: - NPO except for sips and chips remainder of today and overnight - Maintain G-tube to LWS until tomorrow morning  - May advance diet as tolerated and begin using tube tomorrow morning  Signed,   Anely Spiewak S. Cathey Fredenburg, DO   

## 2017-04-29 NOTE — Progress Notes (Signed)
RN received pt back from IR. Order states to place patient G tube on LIWS overnight. Pt not distended or vomiting at this time. Kathlene Cote, MD paged and stated ok to not place G tube on suction. RN will continue to monitor.

## 2017-04-30 LAB — COMPREHENSIVE METABOLIC PANEL
ALK PHOS: 214 U/L — AB (ref 38–126)
ALT: 32 U/L (ref 17–63)
AST: 46 U/L — AB (ref 15–41)
Albumin: 2.1 g/dL — ABNORMAL LOW (ref 3.5–5.0)
Anion gap: 9 (ref 5–15)
BILIRUBIN TOTAL: 0.5 mg/dL (ref 0.3–1.2)
BUN: 71 mg/dL — ABNORMAL HIGH (ref 6–20)
CALCIUM: 8.5 mg/dL — AB (ref 8.9–10.3)
CHLORIDE: 122 mmol/L — AB (ref 101–111)
CO2: 21 mmol/L — ABNORMAL LOW (ref 22–32)
CREATININE: 2.43 mg/dL — AB (ref 0.61–1.24)
GFR, EST AFRICAN AMERICAN: 30 mL/min — AB (ref >60)
GFR, EST NON AFRICAN AMERICAN: 26 mL/min — AB (ref >60)
Glucose, Bld: 200 mg/dL — ABNORMAL HIGH (ref 65–99)
Potassium: 4.5 mmol/L (ref 3.5–5.1)
Sodium: 152 mmol/L — ABNORMAL HIGH (ref 135–145)
TOTAL PROTEIN: 6.2 g/dL — AB (ref 6.5–8.1)

## 2017-04-30 LAB — GLUCOSE, CAPILLARY
GLUCOSE-CAPILLARY: 152 mg/dL — AB (ref 65–99)
GLUCOSE-CAPILLARY: 192 mg/dL — AB (ref 65–99)
Glucose-Capillary: 161 mg/dL — ABNORMAL HIGH (ref 65–99)
Glucose-Capillary: 221 mg/dL — ABNORMAL HIGH (ref 65–99)
Glucose-Capillary: 231 mg/dL — ABNORMAL HIGH (ref 65–99)
Glucose-Capillary: 93 mg/dL (ref 65–99)

## 2017-04-30 MED ORDER — JEVITY 1.5 CAL/FIBER PO LIQD
1000.0000 mL | ORAL | Status: DC
  Administered 2017-04-30 – 2017-05-03 (×4): 1000 mL
  Filled 2017-04-30 (×9): qty 1000

## 2017-04-30 MED ORDER — PRO-STAT SUGAR FREE PO LIQD
30.0000 mL | Freq: Every day | ORAL | Status: DC
  Administered 2017-04-30 – 2017-05-04 (×5): 30 mL
  Filled 2017-04-30 (×4): qty 30

## 2017-04-30 MED ORDER — FREE WATER
250.0000 mL | Status: DC
  Administered 2017-04-30 – 2017-05-05 (×28): 250 mL

## 2017-04-30 MED ORDER — BACITRACIN-NEOMYCIN-POLYMYXIN 400-5-5000 EX OINT
1.0000 "application " | TOPICAL_OINTMENT | Freq: Every day | CUTANEOUS | Status: AC
  Administered 2017-04-30 – 2017-05-06 (×7): 1 via TOPICAL
  Filled 2017-04-30: qty 28.4

## 2017-04-30 MED ORDER — DEXTROSE 5 % IV SOLN
INTRAVENOUS | Status: DC
  Administered 2017-04-30 – 2017-05-01 (×3): via INTRAVENOUS

## 2017-04-30 NOTE — Care Management Important Message (Signed)
Important Message  Patient Details  Name: Richard Bennett MRN: [id number] Date of Birth: Sep 01, 1950   Medicare Important Message Given:  Yes    Garreth Burnsworth Montine Circle 04/30/2017, 3:54 PM

## 2017-04-30 NOTE — Progress Notes (Signed)
Referring Physician(s): Dr. Sherral Hammers  Supervising Physician: Marybelle Killings  Patient Status:  [facility] - In-pt  Chief Complaint: Dysphagia, CVA  Subjective: Non-communicative.  G-tube in place. NGT remains for now.   Allergies: Patient has no known allergies.  Medications: Prior to Admission medications   Medication Sig Start Date End Date Taking? Authorizing Provider  amLODipine (NORVASC) 10 MG tablet Take 10 mg by mouth daily.    [provider]  aspirin 325 MG tablet Take 1 tablet (325 mg total) by mouth daily. Patient not taking: Reported on 04/11/2017 07/28/16   Murlean Iba, MD  atorvastatin (LIPITOR) 80 MG tablet Take 1 tablet (80 mg total) by mouth daily at 6 PM. Patient not taking: Reported on 04/11/2017 07/27/16   Murlean Iba, MD  cloNIDine (CATAPRES) 0.1 MG tablet Take 0.1 mg by mouth 2 (two) times daily.    [provider]  Eluxadoline (VIBERZI) 100 MG TABS Take 100 mg by mouth 2 (two) times daily.    [provider]  ferrous sulfate 325 (65 FE) MG tablet Take 1 tablet (325 mg total) by mouth daily with breakfast. Patient not taking: Reported on 04/11/2017 07/28/16   Irwin Brakeman L, MD  hydrochlorothiazide (MICROZIDE) 12.5 MG capsule Take 12.5 mg by mouth daily.    [provider]  LANTUS SOLOSTAR 100 UNIT/ML Solostar Pen Inject 15 Units into the skin every morning. Patient not taking: Reported on 3/0/1601 0/93/23   Delora Fuel, MD  losartan (COZAAR) 50 MG tablet Take 1 tablet (50 mg total) by mouth daily. Patient not taking: Reported on 07/06/7320 0/25/42   Delora Fuel, MD  metoprolol tartrate (LOPRESSOR) 25 MG tablet Take 1 tablet (25 mg total) by mouth 2 (two) times daily. Patient not taking: Reported on 04/11/2017 07/27/16   Irwin Brakeman L, MD  sodium bicarbonate 650 MG tablet Take 1 tablet (650 mg total) by mouth 2 (two) times daily. Patient not taking: Reported on 04/11/2017 07/27/16   Murlean Iba, MD      Vital Signs: BP (!) 147/77 (BP Location: Left Arm)   Pulse 69   Temp 98.7 F (37.1 C) (Oral)   Resp 12   Ht 5\' 8"  (1.727 m)   Wt 156 lb 15.5 oz (71.2 kg)   SpO2 100%   BMI 23.87 kg/m   Physical Exam NAD, non-communicative Abdomen: soft, non-tender, G-tube in place.  Site assessed; dried blood present, minimal, no oozing.  Ready for use.   Imaging: Ir Gastrostomy Tube Mod Sed  Result Date: 04/29/2017 INDICATION: 67 year old male with a history dysphagia EXAM: PERC PLACEMENT GASTROSTOMY MEDICATIONS: 2.0 g Ancef; Antibiotics were administered within 1 hour of the procedure. Glucagon 1 mg IV ANESTHESIA/SEDATION: Versed 1.0 mg IV; Fentanyl 50 mcg IV Moderate Sedation Time:  12 minutes The patient was continuously monitored during the procedure by the interventional radiology nurse under my direct supervision. CONTRAST:  8 cc-administered into the gastric lumen. FLUOROSCOPY TIME:  Fluoroscopy Time: 3 minutes 24 seconds COMPLICATIONS: None immediate. PROCEDURE: Informed written consent was obtained from the patient's family after a thorough discussion of the procedural risks, benefits and alternatives. All questions were addressed. Maximal Sterile Barrier Technique was utilized including caps, mask, sterile gowns, sterile gloves, sterile drape, hand hygiene and skin antiseptic. A timeout was performed prior to the initiation of the procedure. The procedure, risks, benefits, and alternatives were explained to the patient. Questions regarding the procedure were encouraged and answered. The patient understands and consents to the procedure. The  epigastrium was prepped with Betadine in a sterile fashion, and a sterile drape was applied covering the operative field. A sterile gown and sterile gloves were used for the procedure. A 5-French orogastric tube is placed under fluoroscopic guidance. Scout imaging of the abdomen confirms barium within the transverse colon. The stomach was distended with gas.  Under fluoroscopic guidance, an 18 gauge needle was utilized to puncture the anterior wall of the body of the stomach. An Amplatz wire was advanced through the needle passing a T fastener into the lumen of the stomach. The T fastener was secured for gastropexy. A 9-French sheath was inserted. A snare was advanced through the 9-French sheath. A Britta Mccreedy was advanced through the orogastric tube. It was snared then pulled out the oral cavity, pulling the snare, as well. The leading edge of the gastrostomy was attached to the snare. It was then pulled down the esophagus and out the percutaneous site. It was secured in place. Contrast was injected. No complication IMPRESSION: Status post fluoroscopic placed percutaneous gastrostomy tube, with 20 [place] pull-through. Signed, Dulcy Fanny. Earleen Newport, DO [facility] Radiology Electronically Signed   By: Corrie Mckusick D.O.   On: 04/29/2017 16:00    Labs:  CBC: Recent Labs    04/25/17 0411 04/26/17 0714 04/27/17 0234 04/29/17 0242  WBC 7.9 7.5 8.5 6.5  HGB 7.4* 8.5* 8.0* 8.4*  HCT 24.0* 27.1* 25.6* 27.1*  PLT 257 279 282 294    COAGS: Recent Labs    07/23/16 1129 04/11/17 0140 04/23/17 0448 04/28/17 0213  INR 1.04 0.91 1.02 1.09    BMP: Recent Labs    04/27/17 0234 04/28/17 0213 04/29/17 0242 04/30/17 0925  NA 141 146* 150* 152*  K 3.9 3.6 3.7 4.5  CL 105 108 117* 122*  CO2 23 25 22  21*  GLUCOSE 178* 122* 137* 200*  BUN 113* 109* 90* 71*  CALCIUM 8.5* 8.6* 8.6* 8.5*  CREATININE 2.71* 2.63* 2.34* 2.43*  GFRNONAA 23* 24* 27* 26*  GFRAA 27* 28* 32* 30*    LIVER FUNCTION TESTS: Recent Labs    07/26/16 1035 07/27/16 0318 04/11/17 0140  04/17/17 1838  04/26/17 0714 04/27/17 0234 04/28/17 0213 04/30/17 0925  BILITOT 0.2* 0.4 0.5  --   --   --   --   --   --  0.5  AST 34 33 52*  --   --   --   --   --   --  46*  ALT 23 24 23   --  21  --   --   --   --  32  ALKPHOS 74 69 119  --   --    --   --   --   --  214*  PROT 6.0* 6.0* 5.6*  --   --   --   --   --   --  6.2*  ALBUMIN 2.5* 2.6* 2.8*   < >  --    < > 2.2* 2.2* 2.1* 2.1*   < > = values in this interval not displayed.    Assessment and Plan: Dysphagia G-tube placed yesterday. NGT remains in place.  Site assessed; dried blood, but otherwise intact and no issues note.  Ready for use.   Electronically Signed: Docia Barrier, PA 04/30/2017, 1:19 PM   I spent a total of 15 Minutes at the the patient's bedside AND on the patient's hospital floor or unit, greater than 50% of which was counseling/coordinating  care for dysphagia.

## 2017-04-30 NOTE — Clinical Social Work Note (Addendum)
No return call from [facility] worker yet. CSW tried calling again. Did not leave a second voicemail.  Dayton Scrape, [place] [phone]  11:55 am Left another voicemail for Medicaid worker.  Dayton Scrape, [place] [phone]  12:28 pm Discussed case with assistant director of social work. He advised that CSW called patient's son to confirm that patient's brother can make discharge decisions since no HCPOA is established. CSW left a voicemail.  Dayton Scrape, [place]

## 2017-04-30 NOTE — Progress Notes (Addendum)
Physical Therapy Treatment Patient Details Name: Richard Bennett MRN: [id number] DOB: [birth year] Today's Date: 04/30/2017    History of Present Illness Pt is a 67 y/o male admitted secondary to seizures, UDS + for cocaine and benzos. Pt also with acute on chronic CVA, felt to be watershed in nature. Pt required intubation with vent support from 2/8 to 2/22. Pt also required CRRT for a brief period. PMH including but not limited to CVA, DM and HTN.    PT Comments    Pt performed positioning and boosting in bed to achieve optimal position for lung function and digestion.  Pt just received meds from RN via NG tube.  Propped and positioned R side to reduce edema in R hand.  Pt remains to move L LE and UE without prompting and non purposeful movements.  At this time he continues to benefit from long term SNF care and trial basis of PT to improve positioning when in bed during acute hospitalization.   Follow Up Recommendations  SNF;Other (comment)(anticipation for need of long term custodial care.  )     Equipment Recommendations  None recommended by PT    Recommendations for Other Services       Precautions / Restrictions Precautions Precautions: Fall Precaution Comments: NG tube, R hemi Restrictions Weight Bearing Restrictions: No    Mobility  Bed Mobility Overal bed mobility: Needs Assistance             General bed mobility comments: Total A for all aspects, performing boosting in bed and positioned patient in the chair position as he had just taken his meds per RN.  Elevated R hand and forearm which appears swollen and was underneath him with his R forearm over pronated.  Propped pillow onR side of trunk to reduce lean to the R.  readjusted prevalon boots to B feet for correct fit and positioned wedge to maintain neutral position.    Transfers Overall transfer level: Needs assistance               General transfer comment: pt requires mechanical lift, not performed but  this is the only safe option for OOB mobility based on physical and cognitive abilities.    Ambulation/Gait                 Stairs            Wheelchair Mobility    Modified Rankin (Stroke Patients Only)       Balance                                            Cognition Arousal/Alertness: Awake/alert Behavior During Therapy: Flat affect Overall Cognitive Status: Difficult to assess Area of Impairment: Following commands                       Following Commands: (pt remains unable to follow commands)       General Comments: Pt grunting but unable to follow commands given.  When stating patient's name he will look toward PTA but unclear if it is a purposeful movement.  Pt not tracking today.  Pt moving L arm and leg without cueing.        Exercises      General Comments General comments (skin integrity, edema, etc.): not assessed.        Pertinent Vitals/Pain Pain Assessment: Faces  Faces Pain Scale: No hurt    Home Living                      Prior Function            PT Goals (current goals can now be found in the care plan section) Acute Rehab PT Goals Patient Stated Goal: unable to state Potential to Achieve Goals: Good Progress towards PT goals: Progressing toward goals    Frequency    Min 1X/week(trial basis)      PT Plan Current plan remains appropriate    Co-evaluation              AM-PAC PT "6 Clicks" Daily Activity  Outcome Measure  Difficulty turning over in bed (including adjusting bedclothes, sheets and blankets)?: Unable Difficulty moving from lying on back to sitting on the side of the bed? : Unable Difficulty sitting down on and standing up from a chair with arms (e.g., wheelchair, bedside commode, etc,.)?: Unable Help needed moving to and from a bed to chair (including a wheelchair)?: Total Help needed walking in hospital room?: Total Help needed climbing 3-5 steps with a  railing? : Total 6 Click Score: 6    End of Session Equipment Utilized During Treatment: Gait belt Activity Tolerance: (remains limited due to cognitive deficits.  ) Patient left: in bed;with call bell/phone within reach;with bed alarm set;with SCD's reapplied Nurse Communication: Mobility status;Need for lift equipment(informed nursing of R hand edema.  ) PT Visit Diagnosis: Other abnormalities of gait and mobility (R26.89);Other symptoms and signs involving the nervous system (R29.898);Hemiplegia and hemiparesis Hemiplegia - Right/Left: Right Hemiplegia - caused by: Cerebral infarction     Time: 7989-2119 PT Time Calculation (min) (ACUTE ONLY): 14 min  Charges:  $Therapeutic Activity: 8-22 mins                    G Codes:       Governor Rooks, PTA pager [phone]    Cristela Blue 04/30/2017, 11:52 AM

## 2017-04-30 NOTE — Progress Notes (Signed)
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 EMS was called and the patient was given Versed.   Significant Events: 2/08Admit withacute encephalopathy, seizures,hyperglycemia, and HTN emergency - intubated 2/09 Started on Dilantin per Neurology  2/10Sedated - right hemiparesis > MRI - foley placed by Urology (urethral stricture) 2/14Hemodialysis 2/15 Tolerating CPAP/PS but not following commands 2/20Had to be placed back on vent support, still not following commands 2/22 Extubated   Subjective: Eyes are open - does not follow commands - does not interact w/ examiner.  No evidence of resp distress or uncontrolled pain.    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  Secondary to cocaine use - has stabilized - not expected to improve significantly beyond his present state - needs lifelong SNF placement   Dysphagia SLP following - remains NPO - now s/p PEG tube - begin PEG feeds today   Acute hypoxic respiratory failure in setting of seizure activity  Resolved - sats 100% on RA    Acute encephalopathy Remains noncommunicative - suspect he suffered significant anoxic injury due to his seizure and strokes - minimized all sedating meds in a stepwise fashion - do not expect signif improvement beyond his present state   Acute kidney failure  Due to above - crt had been slowly improving, but is now climbing again, likely due to [facility] - volume expand and follow   Hypernatremia Due to [facility] - begin free water via PEG and follow   Seizures Appear controlled for now   Cocaine abuse  UDS positive for cocaine and benzos (given by  EMS) at time of admit   Anemia No signs of bleeding - most c/w ACD  DM2 A1c 11.8 04/17/17 - CBG controlled    DVT prophylaxis: SQ heparin  Code Status: FULL CODE Family Communication: no family present at time of exam  Disposition Plan: SDU   Consultants:  Neurology  PCCM  Antimicrobials:  None    Objective: Blood pressure (!) 147/77, pulse 69, temperature 98.7 F (37.1 C), temperature source Oral, resp. rate 12, height 5' 8"  (1.727 m), weight 71.2 kg (156 lb 15.5 oz), SpO2 100 %.  Intake/Output Summary (Last 24 hours) at 04/30/2017 1538 Last data filed at 04/30/2017 1216 Gross per 24 hour  Intake 2063.75 ml  Output 1450 ml  Net 613.75 ml   Filed Weights   04/28/17 0353 04/29/17 0334 04/30/17 0342  Weight: 68.5 kg (151 lb 0.2 oz) 71.1 kg (156 lb 12 oz) 71.2 kg (156 lb 15.5 oz)    Examination: General: No acute respiratory distress Lungs: Clear to auscultation bilaterally  Cardiovascular: RRR Abdomen: NT/ND, soft, bs+, no mass  Extremities: No signif edema B LE   CBC: Recent Labs  Lab 04/25/17 0411 04/26/17 0714 04/27/17 0234 04/29/17 0242  WBC 7.9 7.5 8.5 6.5  HGB 7.4* 8.5* 8.0* 8.4*  HCT 24.0* 27.1* 25.6* 27.1*  MCV 85.1 86.0 85.9 87.7  PLT 257 279 282 374   Basic Metabolic Panel: Recent Labs  Lab 04/24/17 0511 04/25/17 0411 04/26/17 0714 04/27/17 0234 04/28/17 0213 04/29/17 0242 04/30/17 0925  NA 140 141 141 141 146* 150* 152*  K 3.6 4.2 3.7 3.9 3.6 3.7 4.5  CL 98* 100* 101 105 108 117* 122*  CO2 27 26 23 23 25 22  21*  GLUCOSE 146* 215* 171* 178* 122* 137* 200*  BUN 106* 113* 113* 113* 109* 90* 71*  CREATININE 3.31* 3.25* 3.02* 2.71* 2.63* 2.34* 2.43*  CALCIUM 8.5* 8.5* 8.7* 8.5* 8.6* 8.6* 8.5*  MG  --  2.6*  --   --  2.7*  --   --   PHOS 5.2* 5.2* 5.5* 4.7* 3.9  --   --    GFR: Estimated Creatinine Clearance: 28.9 mL/min (A) (by C-G formula based on SCr of 2.43 mg/dL (H)).  Liver Function Tests: Recent Labs  Lab 04/25/17 0411  04/26/17 0714 04/27/17 0234 04/28/17 0213 04/30/17 0925  AST  --   --   --   --  46*  ALT  --   --   --   --  32  ALKPHOS  --   --   --   --  214*  BILITOT  --   --   --   --  0.5  PROT  --   --   --   --  6.2*  ALBUMIN 2.1* 2.2* 2.2* 2.1* 2.1*    Coagulation Profile: Recent Labs  Lab 04/28/17 0213  INR 1.09    HbA1C: Hgb A1c MFr Bld  Date/Time Value Ref Range Status  04/17/2017 08:30 AM 11.8 (H) 4.8 - 5.6 % Final    Comment:    (NOTE) Pre diabetes:          5.7%-6.4% Diabetes:              >6.4% Glycemic control for   <7.0% adults with diabetes   07/25/2016 04:38 AM 11.9 (H) 4.8 - 5.6 % Final    Comment:    (NOTE)         Pre-diabetes: 5.7 - 6.4         Diabetes: >6.4         Glycemic control for adults with diabetes: <7.0     CBG: Recent Labs  Lab 04/29/17 1616 04/29/17 2012 04/30/17 0031 04/30/17 0455 04/30/17 0816  GLUCAP 91 127* 93 161* 152*     Scheduled Meds: . amLODipine  10 mg Per Tube Daily  . aspirin  325 mg Per Tube Daily  . atorvastatin  40 mg Per Tube q1800  . chlorhexidine gluconate (MEDLINE KIT)  15 mL Mouth Rinse BID  . cloNIDine  0.2 mg Per Tube BID  . feeding supplement (PRO-STAT SUGAR FREE 64)  30 mL Per Tube Daily  . folic acid  1 mg Per Tube Daily  . heparin  5,000 Units Subcutaneous Q8H  . insulin aspart  0-20 Units Subcutaneous Q4H  . labetalol  200 mg Per Tube Q8H  . levETIRAcetam  500 mg Per Tube BID  . mouth rinse  15 mL Mouth Rinse q12n4p  . neomycin-bacitracin-polymyxin  1 application Topical Daily  . thiamine  100 mg Per Tube Daily     LOS: 19 days   [name], MD [facility] Office  [phone] Pager - Text Page per Amion as per below:  On-Call/Text Page:      [email or web address]      password [email or web address]  If 7PM-7AM, please contact night-coverage [email or web address] Password [email or web address] 04/30/2017, 3:38 PM

## 2017-05-01 DIAGNOSIS — R4702 Dysphasia: Secondary | ICD-10-CM

## 2017-05-01 DIAGNOSIS — D638 Anemia in other chronic diseases classified elsewhere: Secondary | ICD-10-CM

## 2017-05-01 LAB — GLUCOSE, CAPILLARY
GLUCOSE-CAPILLARY: 224 mg/dL — AB (ref 65–99)
GLUCOSE-CAPILLARY: 220 mg/dL — AB (ref 65–99)
GLUCOSE-CAPILLARY: 249 mg/dL — AB (ref 65–99)
Glucose-Capillary: 179 mg/dL — ABNORMAL HIGH (ref 65–99)
Glucose-Capillary: 197 mg/dL — ABNORMAL HIGH (ref 65–99)
Glucose-Capillary: 259 mg/dL — ABNORMAL HIGH (ref 65–99)
Glucose-Capillary: 265 mg/dL — ABNORMAL HIGH (ref 65–99)

## 2017-05-01 LAB — CBC
HCT: 26.1 % — ABNORMAL LOW (ref 39.0–52.0)
HEMOGLOBIN: 8.2 g/dL — AB (ref 13.0–17.0)
MCH: 27.4 pg (ref 26.0–34.0)
MCHC: 31.4 g/dL (ref 30.0–36.0)
MCV: 87.3 fL (ref 78.0–100.0)
PLATELETS: 292 10*3/uL (ref 150–400)
RBC: 2.99 MIL/uL — ABNORMAL LOW (ref 4.22–5.81)
RDW: 14 % (ref 11.5–15.5)
WBC: 7 10*3/uL (ref 4.0–10.5)

## 2017-05-01 LAB — BASIC METABOLIC PANEL
ANION GAP: 11 (ref 5–15)
BUN: 62 mg/dL — ABNORMAL HIGH (ref 6–20)
CALCIUM: 8.5 mg/dL — AB (ref 8.9–10.3)
CO2: 18 mmol/L — ABNORMAL LOW (ref 22–32)
CREATININE: 2.2 mg/dL — AB (ref 0.61–1.24)
Chloride: 120 mmol/L — ABNORMAL HIGH (ref 101–111)
GFR, EST AFRICAN AMERICAN: 34 mL/min — AB (ref >60)
GFR, EST NON AFRICAN AMERICAN: 29 mL/min — AB (ref >60)
GLUCOSE: 228 mg/dL — AB (ref 65–99)
Potassium: 4.4 mmol/L (ref 3.5–5.1)
Sodium: 149 mmol/L — ABNORMAL HIGH (ref 135–145)

## 2017-05-01 MED ORDER — ACETAMINOPHEN 325 MG PO TABS
650.0000 mg | ORAL_TABLET | Freq: Four times a day (QID) | ORAL | Status: DC | PRN
  Administered 2017-05-01: 650 mg via ORAL
  Filled 2017-05-01: qty 2

## 2017-05-01 NOTE — Plan of Care (Signed)
  Education: Knowledge of disease or condition will improve 05/01/2017 2312 - Not Progressing by Bronson Curb, RN Knowledge of secondary prevention will improve 05/01/2017 2312 - Not Progressing by Bronson Curb, RN Knowledge of patient specific risk factors addressed and post discharge goals established will improve 05/01/2017 2312 - Not Progressing by Bronson Curb, RN   Coping: Will verbalize positive feelings about self 05/01/2017 2312 - Not Progressing by Bronson Curb, RN Note Pt aphasic Will identify appropriate support needs 05/01/2017 2312 - Not Progressing by Bronson Curb, RN   Health Behavior/Discharge Planning: Ability to manage health-related needs will improve 05/01/2017 2312 - Not Progressing by Bronson Curb, RN   Self-Care: Ability to participate in self-care as condition permits will improve 05/01/2017 2312 - Progressing by Sharry Beining, Lanetta Inch, RN Verbalization of feelings and concerns over difficulty with self-care will improve 05/01/2017 2312 - Not Progressing by Bronson Curb, RN Ability to communicate needs accurately will improve 05/01/2017 2312 - Not Progressing by Bronson Curb, RN   Nutrition: Risk of aspiration will decrease 05/01/2017 2312 - Progressing by Bronson Curb, RN Dietary intake will improve 05/01/2017 2312 - Progressing by Alahni Varone, Lanetta Inch, RN

## 2017-05-01 NOTE — Clinical Social Work Note (Addendum)
Still no call back from [facility] worker. CSW left a voicemail for her supervisor, Andi Hence.  Dayton Scrape, [place] [phone]  11:44 am Received call back from Mr. Maricela Bo. He confirmed fax number. [employer] faxed over. He will have patient's Medicaid worker call CSW back to confirm Medicaid application is officially pending. SNF clinical liaison notified. Patient's brother notified that [place] had been sent.  Dayton Scrape, [place]

## 2017-05-01 NOTE — Progress Notes (Signed)
Nutrition Follow-up  DOCUMENTATION CODES:   Not applicable  INTERVENTION:   -Continue Jevity 1.5 @ 50 ml/hr via PEG   Continue 30 ml Prostat daily  Continue 250 ml free water flush every 4 hours  Regimen provides regimen providing 1900 kcals, 92 grams protein, and 2412 ml free water daily, meeting 100% of pt estimated kcal and protein needs.   NUTRITION DIAGNOSIS:   Inadequate oral intake related to inability to eat as evidenced by NPO status.  Ongoing  GOAL:   Patient will meet greater than or equal to 90% of their needs  Met with TF  MONITOR:   TF tolerance, Diet advancement, Labs, I & O's  REASON FOR ASSESSMENT:   Ventilator, Consult(verbal) Enteral/tube feeding initiation and management  ASSESSMENT:   67 yo male with PMH of HTN, HLD, Jehovah's witness, DM-2, stroke who was admitted on 2/8 with seizures, HTN emergency, nonketotic hyperglycemia. Required intubation on admission.  2/23- s/p BSE, recommended continued NPO status, transferred from ICU to SDU 2/26- PEG placed 2/27- NGT removed  Reviewed SLP notes; recommending MBSS to assess swallow function.  Jevity 1.5 infusing via PEG at 50 ml/hr. Pt also receiving 30 ml Prostat daily and 250 ml free water flush every 4 hours. Complete regimen providing 1900 kcals, 92 grams protein, and 2412 ml free water daily, meeting 100% of meals.   Reviewed I/O's; +602 ml x 24 hours and -9.8 L since admission.  Labs reviewed: Na: 149, CBGS: 179-259 (inpatient orders for glycemic control are 0-20 units insulin aspart every 4 hours).  Diet Order:  Seizure precautions Diet NPO time specified  EDUCATION NEEDS:   No education needs have been identified at this time  Skin:  Skin Assessment: Skin Integrity Issues: Skin Integrity Issues:: Other (Comment) Other: laceration to head  Last BM:  04/28/17  Height:   Ht Readings from Last 1 Encounters:  04/11/17 _0  (1.727 m)    Weight:   Wt Readings from Last 1  Encounters:  05/01/17 158 lb 11.7 oz (72 kg)    Ideal Body Weight:  70 kg  BMI:  Body mass index is 24.14 kg/m.  Estimated Nutritional Needs:   Kcal:  1800-2000  Protein:  90-100 gm  Fluid:  1.8-2 L    Ashan Cueva A. Jimmye Norman, RD, LDN, CDE Pager: [phone] After hours Pager: [phone]

## 2017-05-01 NOTE — Evaluation (Addendum)
Speech Language Pathology Evaluation Patient Details Name: Richard Bennett MRN: [id number] DOB: 1950/04/23 Today's Date: 05/01/2017 Time: 1017-5102 SLP Time Calculation (min) (ACUTE ONLY): 11 min  Problem List:  Patient Active Problem List   Diagnosis Date Noted  . Dysphagia   . Goals of care, counseling/discussion   . Palliative care encounter   . Cerebral thrombosis with cerebral infarction 04/17/2017  . Acute respiratory failure with hypoxia ([place])   . Acute encephalopathy   . Hyperosmolar non-ketotic state in patient with type 2 diabetes mellitus ([place]) 04/11/2017  . Status epilepticus ([place]) 04/11/2017  . Irritable bowel syndrome with diarrhea 08/13/2016  . Anemia, chronic disease 08/13/2016  . CKD (chronic kidney disease) stage 3, GFR 30-59 ml/min (HCC) 08/13/2016  . Left carotid stenosis 07/27/2016  . DM (diabetes mellitus), type 2, uncontrolled, periph vascular complic ([place]) 58/52/7782  . Gait disturbance, post-stroke   . Seizure ([place])   . Hypertensive emergency 10/30/2015  . AKI (acute kidney injury) ([place]) 10/30/2015  . Pain in the chest   . Essential hypertension   . Hyperlipidemia    Past Medical History:  Past Medical History:  Diagnosis Date  . Chest pain   . Colon polyps    adenomatous  . Hyperlipidemia   . Hypertension   . Refusal of blood transfusions as patient is Jehovah's Witness   . Stroke ([place])   . Type II diabetes mellitus ([place])    Past Surgical History:  Past Surgical History:  Procedure Laterality Date  . APPENDECTOMY    . BACK SURGERY    . IR GASTROSTOMY TUBE MOD SED  04/29/2017  . LUMBAR DISC SURGERY     "herniated"   HPI:  67 yo M with hx CVA, DM, HTN, presents to the ER with acute encephalopathy and seizures.  Intubated 2/8-2/22. Most recent MRI dated 04/16/2017 is showing new acute infarcts bilaterally left greater then right that appear to be watershed infarcts. Chest xray dated 04/25/2017 is showing no acute cardiopulmonary issues.  Patient is known to [facility] service from previous admission with swallowing evaluation dated 07/25/2016 with recommendation for a dysphagia 2 diet with nectar thick liquids   Assessment / Plan / Recommendation Clinical Impression  Pt presents with a significant communication-cognitive impairment. Total multimodal cues provided unsuccessfully to elicit vocalization (use of music on smart phone, singing, counting). 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 Visual deficit suspected, appears to have decreased focus in midline, unable to track video on phone. Poor sustained attention but would focus attention when named called with raising eyebrows and brief eye contact with therapist. Did not follow commands or respond to simple yes/no questions. ST intervention at hospital and recommended at [facility].      SLP Assessment  SLP Recommendation/Assessment: Patient needs continued Speech Lanaguage Pathology Services SLP Visit Diagnosis: Attention and concentration deficit;Cognitive communication deficit (R41.841) Attention and concentration deficit following: Cerebral infarction    Follow Up Recommendations  Skilled Nursing facility    Frequency and Duration min 2x/week  2 weeks      SLP Evaluation Cognition  Overall Cognitive Status: No family/caregiver present to determine baseline cognitive functioning(suspect decline) Arousal/Alertness: Awake/alert Orientation Level: (no response to y/n ?'s) Attention: Focused;Sustained Focused Attention: Appears intact Sustained Attention: Impaired Sustained Attention Impairment: Verbal basic;Functional basic Memory: (NT) Awareness: (suspect impaired) Problem Solving: Impaired Problem Solving Impairment: Functional basic Executive Function: Self Monitoring;Self Correcting Behaviors: (flat) Safety/Judgment: Impaired       Comprehension  Auditory Comprehension Overall Auditory Comprehension: Impaired Yes/No Questions: (No response to y/n) Commands: (  no response to  commands) Interfering Components: Attention;Processing speed Visual Recognition/Discrimination Discrimination: Not tested Reading Comprehension Reading Status: Not tested    Expression Expression Primary Mode of Expression: Other (comment)(no vocalizations noted) Verbal Expression Overall Verbal Expression: Impaired Initiation: Impaired Automatic Speech: (no response to sing and count) Level of Generative/Spontaneous Verbalization: (n/a) Repetition: (no response) Naming: Not tested Pragmatics: Impairment Impairments: Abnormal affect;Eye contact Interfering Components: Attention Written Expression Dominant Hand: Right Written Expression: Not tested   Oral / Motor  Oral Motor/Sensory Function Overall Oral Motor/Sensory Function: (ROM not assessed- pt unable, no visible asymmetry) Motor Speech Overall Motor Speech: (unable to assess) Respiration: Within functional limits Phonation: (n/a) Resonance: (n/a) Articulation: (n/a) Intelligibility: Unable to assess (comment)   GO                    Houston Siren 05/01/2017, 11:54 AM Orbie Pyo Colvin Caroli.Ed [employer] [phone]

## 2017-05-01 NOTE — Progress Notes (Signed)
Inpatient Diabetes Program Recommendations  AACE/ADA: New Consensus Statement on Inpatient Glycemic Control (2015)  Target Ranges:  Prepandial:   less than 140 mg/dL      Peak postprandial:   less than 180 mg/dL (1-2 hours)      Critically ill patients:  140 - 180 mg/dL   Results for BRADIE, LACOCK (MRN [id number]) as of 05/01/2017 08:34  Ref. Range 04/30/2017 08:16 04/30/2017 15:45 04/30/2017 20:33 04/30/2017 23:49 05/01/2017 04:59  Glucose-Capillary Latest Ref Range: 65 - 99 mg/dL 152 (H) 231 (H) 221 (H) 192 (H) 224 (H)   Review of Glycemic Control   Current orders for Inpatient glycemic control: Novolog 0-20 units Q4H  Inpatient Diabetes Program Recommendations: Insulin - Tube Feeding Coverage: Please consider ordering Novolog 4 units Q4H for tube feeding coverage. If tube feeding is held or stopped then Novolog tube feeding coverage would also be held or stopped.  Thanks, Barnie Alderman, RN, MSN, CDE Diabetes Coordinator Inpatient Diabetes Program [phone] (Team Pager from 8am to 5pm)

## 2017-05-01 NOTE — Progress Notes (Signed)
Pt moves Rt. Arm slightly toward his body at times today. Most of time nonverbal, but at times make sound. Removed mitts on Lt. Hand, no attempted to pull out any thing. Pt doesn't follow the commands. Temp 101 at 1600, no blood culture and administered tylenol 650 mg via PEG tube, Temp down to 99.7. Continue to monitor temps. HS [employer]

## 2017-05-01 NOTE — Progress Notes (Signed)
  Speech Language Pathology Treatment: Dysphagia  Patient Details Name: Richard Bennett MRN: [id number] DOB: [birth year] Today's Date: 05/01/2017 Time: 2248-2500 SLP Time Calculation (min) (ACUTE ONLY): 11 min  Assessment / Plan / Recommendation Clinical Impression  Skilled ST intervention for swallow abilities. Exhibits neuro and cognitive based dysphagia. Requires frequent verbal cues due to poor sustained attention and did not follow commands with multimodal cues. Oral holding and delayed transit with puree of approximately 10 seconds and s/s airway compromise with thin liquid (throat clearing; of note has audible swallow). Given alert state, oral transit and swallows initiated, recommend MBS prior to discharge for ability to initiate po's in tandem with G-tube feedings. Plan on MBS for 3/1.   HPI HPI: 67 yo M with hx CVA, DM, HTN, presents to the ER with acute encephalopathy and seizures.  Intubated 2/8-2/22. Most recent MRI dated 04/16/2017 is showing new acute infarcts bilaterally left greater then right that appear to be watershed infarcts. Chest xray dated 04/25/2017 is showing no acute cardiopulmonary issues. Patient is known to [facility] service from previous admission with swallowing evaluation dated 07/25/2016 with recommendation for a dysphagia 2 diet with nectar thick liquids      SLP Plan  Continue with current plan of care;MBS       Recommendations  Diet recommendations: NPO Medication Administration: Via alternative means                Oral Care Recommendations: Oral care QID Follow up Recommendations: Skilled Nursing facility SLP Visit Diagnosis: Dysphagia, unspecified (R13.10) Plan: Continue with current plan of care;MBS       GO                [place] Siren 05/01/2017, 11:21 AM  Orbie Pyo Colvin Caroli[employer] [phone]

## 2017-05-01 NOTE — Progress Notes (Signed)
PROGRESS NOTE    Richard Bennett  KAJ:[id number] DOB: 01-15-1951 DOA: 04/11/2017 PCP: [name], MD   Brief Narrative:   67 y.o. 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  Per the report patient was wandering around and is nonverbal and later found to have a generalized tonic-clonic seizure.  EMS was called and patient was given Versed.  No further history is available as family is not able to be reached.  Patient is encephalopathic.     Subjective: 2/28 eyes open spontaneously moves left upper extremity, does not follow commands. Nonverbal. Mild withdraw to painful stimuli bilateral lower extremity   Assessment & Plan:   Principal Problem:   Status epilepticus ([place]) Active Problems:   Hypertensive emergency   Seizure ([place])   CKD (chronic kidney disease) stage 3, GFR 30-59 ml/min (HCC)   Hyperosmolar non-ketotic state in patient with type 2 diabetes mellitus ([place])   Acute respiratory failure with hypoxia ([place])   Acute encephalopathy   Cerebral thrombosis with cerebral infarction   Dysphagia   Goals of care, counseling/discussion   Palliative care encounter  Acute Bilateral Watershed infarct -Secondary to cocaine use -Patient with poor prognosis and goals of care.   Acute respiratory failure with hypoxia -Resolved -Titrate O2 to maintain SPO2> 93%   Acute encephalopathy -Opens eyes, spontaneously moves LUE. Otherwise unresponsive.  Seizures - Keppra 500 mg  BID  -Seizure protocol  Dysphagia -Secondary to bilateral watershed infarct.  -2/26  S/P PEG tube placement .  Polysubstance abuse  -On admission positive for cocaine and benzodiazepine  Anemia chronic disease -No serious signs of bleeding. Some dried blood around right nare. -Anemia panel: Most consistent with anemia of  chronic disease Recent Labs  Lab 04/25/17 0411 04/26/17 0714 04/27/17 0234 04/29/17 0242 05/01/17 0304  HGB 7.4* 8.5* 8.0* 8.4* 8.2*  -Stable   Goals of care -2/24 PALLIATIVE CARE: Consult placed.Bilateral Watershed infarct, eyes open unresponsive. Family with on realistic expectations. Address CODE STATUS should be DO NOT RESUSCITATE,     DVT prophylaxis: Subcutaneous heparin Code Status: Full Family Communication: None Disposition Plan: SNF      Consultants:  Stroke team [facility]     Procedures/Significant Events:  2/08  Admit with acute encephalopathy, seizures, hyperglycemia, and HTN emergency 2/09  Started on Dilantin per neurology  2/10  Sedated and now with right hemiparesis for an MRI 2/14  Hemodialysis 2/15  Tolerating CPAP/PS but not following commands 2/20  Had to be placed back on vent support, still not following commands 2/22  Extubated  2/26 PEG tube placement   I have personally reviewed and interpreted all radiology studies and my findings are as above.  VENTILATOR SETTINGS: None   Cultures 2/8 blood negative   Antimicrobials: Anti-infectives (From admission, onward)   Start     Stop   04/11/17 1345  Ampicillin-Sulbactam (UNASYN) 3 g in sodium chloride 0.9 % 100 mL IVPB     04/17/17 1315   04/11/17 0730  Ampicillin-Sulbactam (UNASYN) 3 g in sodium chloride 0.9 % 100 mL IVPB  Status:  Discontinued     04/11/17 1330       Devices   LINES / TUBES:  ETT 2/8 >> 2/22 OG tube 2/8 >> 2/22  R IJ HD cath 2/13 >> 2/22 Foley 2/10 (placed by urology secondary to urethral stricture) >>  out  PEG tube 2/26>>    Continuous Infusions: . dextrose 100 mL/hr at 05/01/17 0115  . feeding supplement (JEVITY 1.5 CAL/FIBER) 1,000 mL (04/30/17 1700)     Objective: Vitals:   04/30/17 2155 04/30/17 2352 05/01/17 0509 05/01/17 0514  BP: (!) 149/74 138/76  (!) 177/80  Pulse:  63  75  Resp:  15  13  Temp:  97.8 F (36.6 C)  98.6 F (37 C)    TempSrc:  Oral  Axillary  SpO2:  100%  100%  Weight:   158 lb 11.7 oz (72 kg)   Height:        Intake/Output Summary (Last 24 hours) at 05/01/2017 0723 Last data filed at 05/01/2017 2094 Gross per 24 hour  Intake 2127.08 ml  Output 1525 ml  Net 602.08 ml   Filed Weights   04/29/17 0334 04/30/17 0342 05/01/17 0509  Weight: 156 lb 12 oz (71.1 kg) 156 lb 15.5 oz (71.2 kg) 158 lb 11.7 oz (72 kg)    Physical Exam:  General: Eyes open, noncommunicative, No acute respiratory distress Neck:  Negative scars, masses, torticollis, lymphadenopathy, JVD Lungs: Clear to auscultation bilaterally without wheezes or crackles Cardiovascular: Regular rate and rhythm without murmur gallop or rub normal S1 and S2 Abdomen: negative abdominal pain, nondistended, positive soft, bowel sounds, no rebound, no ascites, no appreciable mass Extremities: No significant cyanosis, clubbing, or edema bilateral lower extremities Skin: Negative rashes, lesions, ulcers Psychiatric:  Unable to evaluate secondary to CVA  Central nervous system:  Eyes open, spontaneously moves LUE, does not respond to painful stimuli except in LUE. Nonverbal, does not follow commands.  .     Data Reviewed: Care during the described time interval was provided by me .  I have reviewed this patient's available data, including medical history, events of note, physical examination, and all test results as part of my evaluation.   CBC: Recent Labs  Lab 04/25/17 0411 04/26/17 0714 04/27/17 0234 04/29/17 0242 05/01/17 0304  WBC 7.9 7.5 8.5 6.5 7.0  HGB 7.4* 8.5* 8.0* 8.4* 8.2*  HCT 24.0* 27.1* 25.6* 27.1* 26.1*  MCV 85.1 86.0 85.9 87.7 87.3  PLT 257 279 282 294 709   Basic Metabolic Panel: Recent Labs  Lab 04/25/17 0411 04/26/17 0714 04/27/17 0234 04/28/17 0213 04/29/17 0242 04/30/17 0925 05/01/17 0304  NA 141 141 141 146* 150* 152* 149*  K 4.2 3.7 3.9 3.6 3.7 4.5 4.4  CL 100* 101 105 108 117* 122* 120*  CO2 _0 21* 18*  GLUCOSE 215* 171* 178* 122* 137* 200* 228*  BUN 113* 113* 113* 109* 90* 71* 62*  CREATININE 3.25* 3.02* 2.71* 2.63* 2.34* 2.43* 2.20*  CALCIUM 8.5* 8.7* 8.5* 8.6* 8.6* 8.5* 8.5*  MG 2.6*  --   --  2.7*  --   --   --   PHOS 5.2* 5.5* 4.7* 3.9  --   --   --    GFR: Estimated Creatinine Clearance: 32 mL/min (A) (by C-G formula based on SCr of 2.2 mg/dL (H)). Liver Function Tests: Recent Labs  Lab 04/25/17 0411 04/26/17 0714 04/27/17 0234 04/28/17 0213 04/30/17 0925  AST  --   --   --   --  46*  ALT  --   --   --   --  32  ALKPHOS  --   --   --   --  214*  BILITOT  --   --   --   --  0.5  PROT  --   --   --   --  6.2*  ALBUMIN 2.1* 2.2* 2.2* 2.1* 2.1*   No results for input(s): LIPASE, AMYLASE in the last 168 hours. No results for input(s): AMMONIA in the last 168 hours. Coagulation Profile: Recent Labs  Lab 04/28/17 0213  INR 1.09   Cardiac Enzymes: No results for input(s): CKTOTAL, CKMB, CKMBINDEX, TROPONINI in the last 168 hours. BNP (last 3 results) No results for input(s): PROBNP in the last 8760 hours. HbA1C: No results for input(s): HGBA1C in the last 72 hours. CBG: Recent Labs  Lab 04/30/17 0816 04/30/17 1545 04/30/17 2033 04/30/17 2349 05/01/17 0459  GLUCAP 152* 231* 221* 192* 224*   Lipid Profile: No results for input(s): CHOL, HDL, LDLCALC, TRIG, CHOLHDL, LDLDIRECT in the last 72 hours. Thyroid Function Tests: No results for input(s): TSH, T4TOTAL, FREET4, T3FREE, THYROIDAB in the last 72 hours. Anemia Panel: No results for input(s): VITAMINB12, FOLATE, FERRITIN, TIBC, IRON, RETICCTPCT in the last 72 hours. Urine analysis:    Component Value Date/Time   COLORURINE STRAW (A) 04/11/2017 0447   APPEARANCEUR CLEAR 04/11/2017 0447   LABSPEC 1.013 04/11/2017 0447   PHURINE 7.0 04/11/2017 0447   GLUCOSEU >=500 (A) 04/11/2017 0447   HGBUR SMALL (A) 04/11/2017 0447   BILIRUBINUR NEGATIVE 04/11/2017 0447   KETONESUR NEGATIVE 04/11/2017 0447    PROTEINUR 100 (A) 04/11/2017 0447   NITRITE NEGATIVE 04/11/2017 0447   LEUKOCYTESUR NEGATIVE 04/11/2017 0447   Sepsis Labs: _0 (procalcitonin:4,lacticidven:4)  )No results found for this or any previous visit (from the past 240 hour(s)).       Radiology Studies: Ir Gastrostomy Tube Mod Sed  Result Date: 04/29/2017 INDICATION: 67 year old male with a history dysphagia EXAM: PERC PLACEMENT GASTROSTOMY MEDICATIONS: 2.0 g Ancef; Antibiotics were administered within 1 hour of the procedure. Glucagon 1 mg IV ANESTHESIA/SEDATION: Versed 1.0 mg IV; Fentanyl 50 mcg IV Moderate Sedation Time:  12 minutes The patient was continuously monitored during the procedure by the interventional radiology nurse under my direct supervision. CONTRAST:  8 cc-administered into the gastric lumen. FLUOROSCOPY TIME:  Fluoroscopy Time: 3 minutes 24 seconds COMPLICATIONS: None immediate. PROCEDURE: Informed written consent was obtained from the patient's family after a thorough discussion of the procedural risks, benefits and alternatives. All questions were addressed. Maximal Sterile Barrier Technique was utilized including caps, mask, sterile gowns, sterile gloves, sterile drape, hand hygiene and skin antiseptic. A timeout was performed prior to the initiation of the procedure. The procedure, risks, benefits, and alternatives were explained to the patient. Questions regarding the procedure were encouraged and answered. The patient understands and consents to the procedure. The epigastrium was prepped with Betadine in a sterile fashion, and a sterile drape was applied covering the operative field. A sterile gown and sterile gloves were used for the procedure. A 5-French orogastric tube is placed under fluoroscopic guidance. Scout imaging of the abdomen confirms barium within the transverse colon. The stomach was distended with gas. Under fluoroscopic guidance, an 18 gauge needle was utilized to puncture the anterior  wall of the body of the stomach. An Amplatz wire was advanced through the needle passing a T fastener into the lumen of the stomach. The T fastener was secured for gastropexy. A 9-French sheath was inserted. A snare was advanced through the 9-French sheath. A Britta Mccreedy was advanced through the orogastric tube. It was snared then pulled out the oral cavity, pulling the snare, as well. The leading edge of the gastrostomy was attached to  the snare. It was then pulled down the esophagus and out the percutaneous site. It was secured in place. Contrast was injected. No complication IMPRESSION: Status post fluoroscopic placed percutaneous gastrostomy tube, with 20 [place] pull-through. Signed, Dulcy Fanny. Earleen Newport, DO [facility] Electronically Signed   By: Corrie Mckusick D.O.   On: 04/29/2017 16:00        Scheduled Meds: . amLODipine  10 mg Per Tube Daily  . aspirin  325 mg Per Tube Daily  . atorvastatin  40 mg Per Tube q1800  . chlorhexidine gluconate (MEDLINE KIT)  15 mL Mouth Rinse BID  . cloNIDine  0.2 mg Per Tube BID  . feeding supplement (PRO-STAT SUGAR FREE 64)  30 mL Per Tube Daily  . folic acid  1 mg Per Tube Daily  . free water  250 mL Per Tube Q4H  . heparin  5,000 Units Subcutaneous Q8H  . insulin aspart  0-20 Units Subcutaneous Q4H  . labetalol  200 mg Per Tube Q8H  . levETIRAcetam  500 mg Per Tube BID  . mouth rinse  15 mL Mouth Rinse q12n4p  . neomycin-bacitracin-polymyxin  1 application Topical Daily  . thiamine  100 mg Per Tube Daily   Continuous Infusions: . dextrose 100 mL/hr at 05/01/17 0115  . feeding supplement (JEVITY 1.5 CAL/FIBER) 1,000 mL (04/30/17 1700)     LOS: 20 days    Time spent: 40 minutes    Kirstyn Lean, Geraldo Docker, MD [facility] Pager [phone]   If 7PM-7AM, please contact night-coverage [email or web address] Password [facility] 05/01/2017, 7:23 AM

## 2017-05-02 ENCOUNTER — Inpatient Hospital Stay (HOSPITAL_COMMUNITY): Payer: Medicare HMO

## 2017-05-02 DIAGNOSIS — I10 Essential (primary) hypertension: Secondary | ICD-10-CM

## 2017-05-02 DIAGNOSIS — D631 Anemia in chronic kidney disease: Secondary | ICD-10-CM

## 2017-05-02 DIAGNOSIS — M7989 Other specified soft tissue disorders: Secondary | ICD-10-CM

## 2017-05-02 LAB — GLUCOSE, CAPILLARY
GLUCOSE-CAPILLARY: 200 mg/dL — AB (ref 65–99)
Glucose-Capillary: 247 mg/dL — ABNORMAL HIGH (ref 65–99)
Glucose-Capillary: 145 mg/dL — ABNORMAL HIGH (ref 65–99)
Glucose-Capillary: 170 mg/dL — ABNORMAL HIGH (ref 65–99)
Glucose-Capillary: 230 mg/dL — ABNORMAL HIGH (ref 65–99)
Glucose-Capillary: 190 mg/dL — ABNORMAL HIGH (ref 65–99)

## 2017-05-02 LAB — BASIC METABOLIC PANEL WITH GFR
Anion gap: 11 (ref 5–15)
BUN: 55 mg/dL — ABNORMAL HIGH (ref 6–20)
CO2: 21 mmol/L — ABNORMAL LOW (ref 22–32)
Calcium: 8.7 mg/dL — ABNORMAL LOW (ref 8.9–10.3)
Chloride: 113 mmol/L — ABNORMAL HIGH (ref 101–111)
Creatinine, Ser: 1.99 mg/dL — ABNORMAL HIGH (ref 0.61–1.24)
GFR calc Af Amer: 39 mL/min — ABNORMAL LOW
GFR calc non Af Amer: 33 mL/min — ABNORMAL LOW
Glucose, Bld: 152 mg/dL — ABNORMAL HIGH (ref 65–99)
Potassium: 4.4 mmol/L (ref 3.5–5.1)
Sodium: 145 mmol/L (ref 135–145)

## 2017-05-02 LAB — CBC
HCT: 26.3 % — ABNORMAL LOW (ref 39.0–52.0)
Hemoglobin: 8.1 g/dL — ABNORMAL LOW (ref 13.0–17.0)
MCH: 26.6 pg (ref 26.0–34.0)
MCHC: 30.8 g/dL (ref 30.0–36.0)
MCV: 86.5 fL (ref 78.0–100.0)
Platelets: 302 K/uL (ref 150–400)
RBC: 3.04 MIL/uL — ABNORMAL LOW (ref 4.22–5.81)
RDW: 13.8 % (ref 11.5–15.5)
WBC: 8.2 K/uL (ref 4.0–10.5)

## 2017-05-02 LAB — MAGNESIUM: Magnesium: 2.3 mg/dL (ref 1.7–2.4)

## 2017-05-02 MED ORDER — CLONIDINE HCL 0.1 MG PO TABS
0.3000 mg | ORAL_TABLET | Freq: Two times a day (BID) | ORAL | Status: DC
  Administered 2017-05-02 – 2017-05-09 (×14): 0.3 mg
  Filled 2017-05-02 (×14): qty 1

## 2017-05-02 NOTE — NC FL2 (Signed)
[place] LEVEL OF CARE SCREENING TOOL     IDENTIFICATION  Patient Name: Richard Bennett Birthdate: May 20, 1950 Sex: male Admission Date (Current Location): 04/11/2017  [facility] and [place] Number:  Herbalist and Address:  [facility] [address], [place], [place] [address]      Provider Number: [id number]  Attending Physician Name and Address:  Allie Bossier, MD  Relative Name and Phone Number:       Current Level of Care: Hospital Recommended Level of Care: [place] Prior Approval Number:    Date Approved/Denied:   PASRR Number: [id number]  Discharge Plan: SNF    Current Diagnoses: Patient Active Problem List   Diagnosis Date Noted  . Dysphagia   . Goals of care, counseling/discussion   . Palliative care encounter   . Cerebral thrombosis with cerebral infarction 04/17/2017  . Acute respiratory failure with hypoxia ([place])   . Acute encephalopathy   . Hyperosmolar non-ketotic state in patient with type 2 diabetes mellitus ([place]) 04/11/2017  . Status epilepticus ([place]) 04/11/2017  . Irritable bowel syndrome with diarrhea 08/13/2016  . Anemia, chronic disease 08/13/2016  . CKD (chronic kidney disease) stage 3, GFR 30-59 ml/min (HCC) 08/13/2016  . Left carotid stenosis 07/27/2016  . DM (diabetes mellitus), type 2, uncontrolled, periph vascular complic ([place]) 84/16/6063  . Gait disturbance, post-stroke   . Seizure ([place])   . Hypertensive emergency 10/30/2015  . AKI (acute kidney injury) ([place]) 10/30/2015  . Pain in the chest   . Essential hypertension   . Hyperlipidemia     Orientation RESPIRATION BLADDER Height & Weight     Self  Normal Continent, Indwelling catheter Weight: 159 lb 13.3 oz (72.5 kg) Height:  5' 8"  (172.7 cm)  BEHAVIORAL SYMPTOMS/MOOD NEUROLOGICAL BOWEL NUTRITION STATUS  (Not interactive) Convulsions/Seizures Incontinent Feeding tube([place])  AMBULATORY STATUS COMMUNICATION OF NEEDS  Skin   Total Care Does not communicate Other (Comment)(Excoriated. Laceration on head with sutures.)                       Personal Care Assistance Level of Assistance  Total care       Total Care Assistance: Maximum assistance   Functional Limitations Info  Sight, Hearing, Speech Sight Info: (Not interactive) Hearing Info: (Not interactive) Speech Info: Impaired(Expressive aphasia)    SPECIAL CARE FACTORS FREQUENCY  Speech therapy     PT Frequency: 5 x week OT Frequency: 5 x week     Speech Therapy Frequency: 5 x week      Contractures Contractures Info: Not present    Additional Factors Info  Code Status, Allergies Code Status Info: Full Allergies Info: NKDA           Current Medications (05/02/2017):  This is the current hospital active medication list Current Facility-Administered Medications  Medication Dose Route Frequency Provider Last Rate Last Dose  . acetaminophen (TYLENOL) tablet 650 mg  650 mg Oral Q6H PRN Allie Bossier, MD   650 mg at 05/01/17 1707  . amLODipine (NORVASC) tablet 10 mg  10 mg Per Tube Daily Diallo, Abdoulaye, MD   10 mg at 05/01/17 1011  . aspirin tablet 325 mg  325 mg Per Tube Daily Collene Gobble, MD   325 mg at 05/01/17 1011  . atorvastatin (LIPITOR) tablet 40 mg  40 mg Per Tube q1800 Collene Gobble, MD   40 mg at 05/01/17 1707  . bisacodyl (DULCOLAX) suppository 10  mg  10 mg Rectal Daily PRN Jennelle Human B, NP   10 mg at 04/15/17 0935  . chlorhexidine gluconate (MEDLINE KIT) (PERIDEX) 0.12 % solution 15 mL  15 mL Mouth Rinse BID Jennelle Human B, NP   15 mL at 05/01/17 2013  . cloNIDine (CATAPRES) tablet 0.2 mg  0.2 mg Per Tube BID Collene Gobble, MD   0.2 mg at 05/01/17 2152  . docusate (COLACE) 50 MG/5ML liquid 100 mg  100 mg Per Tube BID PRN Jennelle Human B, NP   100 mg at 05/01/17 0054  . feeding supplement (JEVITY 1.5 CAL/FIBER) liquid 1,000 mL  1,000 mL Per Tube Continuous [name], MD 50 mL/hr at 05/01/17  2152 1,000 mL at 05/01/17 2152  . feeding supplement (PRO-STAT SUGAR FREE 64) liquid 30 mL  30 mL Per Tube Daily [name], MD   30 mL at 16/60/63 0160  . folic acid (FOLVITE) tablet 1 mg  1 mg Per Tube Daily [name], MD   1 mg at 05/01/17 1012  . free water 250 mL  250 mL Per Tube Q4H [name], MD   250 mL at 05/02/17 0449  . heparin injection 5,000 Units  5,000 Units Subcutaneous Q8H Monia Sabal, PA-C   5,000 Units at 05/01/17 1354  . hydrALAZINE (APRESOLINE) injection 10-20 mg  10-20 mg Intravenous Q6H PRN Collene Gobble, MD      . insulin aspart (novoLOG) injection 0-20 Units  0-20 Units Subcutaneous Q4H Diallo, Abdoulaye, MD   3 Units at 05/02/17 0842  . labetalol (NORMODYNE) tablet 200 mg  200 mg Per Tube Q8H Erick Colace, NP   200 mg at 05/02/17 1093  . labetalol (NORMODYNE,TRANDATE) injection 5 mg  5 mg Intravenous Q10 min PRN Collene Gobble, MD      . levETIRAcetam (KEPPRA) 100 MG/ML solution 500 mg  500 mg Per Tube BID Susa Raring, RPH   500 mg at 05/01/17 2152  . LORazepam (ATIVAN) injection 1-2 mg  1-2 mg Intravenous Q2H PRN Allie Bossier, MD      . MEDLINE mouth rinse  15 mL Mouth Rinse q12n4p Simonne Maffucci B, MD   15 mL at 05/01/17 1603  . neomycin-bacitracin-polymyxin (NEOSPORIN) ointment 1 application  1 application Topical Daily [name], MD   1 application at 23/55/73 1012  . sodium chloride flush (NS) 0.9 % injection 10-40 mL  10-40 mL Intracatheter PRN Simonne Maffucci B, MD      . thiamine (VITAMIN B-1) tablet 100 mg  100 mg Per Tube Daily [name], MD   100 mg at 05/01/17 1012     Discharge Medications: Please see discharge summary for a list of discharge medications.  Relevant Imaging Results:  Relevant Lab Results:   Additional Information SS#: [id number]. [employer] started Medicaid process at [place] on Monday 2/25. Medicaid worker is Daphene Calamity: [phone].  Candie Chroman, LCSW

## 2017-05-02 NOTE — Progress Notes (Signed)
Pt noted to cough up blood.  Upon inspection pt noted to have nose bleed and drainage down back of throat.  Jeannette Corpus, NP notified.  Order received to hold sq heparin.

## 2017-05-02 NOTE — Progress Notes (Signed)
Modified Barium Swallow Progress Note  Patient Details  Name: Richard Bennett MRN: [id number] Date of Birth: 05/14/1950  Today's Date: 05/02/2017  Modified Barium Swallow completed.  Full report located under Chart Review in the Imaging Section.  Brief recommendations include the following:  Clinical Impression  Pt exhibited severe oral dysphagia marked by decreased bolus cohesion/control/lingual cupping, discoordination, decreased lingual propulsion resulting in mild vallecular premature spill and severe sublingual and buccal cavity residue. He was unable to maneuver barium onto tongue for transit therefore [place] necessary to remove boluses. Minimal pharyngeal swallows initiated however when transfer to pharynx and through esophagus was successful, swallow mechanisms were protective and adequate. 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 Continue NPO recommended and speech therapy to improve oral coordination and ability to transfer boluses.          Swallow Evaluation Recommendations       SLP Diet Recommendations: NPO       Medication Administration: Via alternative means               Oral Care Recommendations: Oral care QID        Houston Siren 05/02/2017,10:34 AM   Orbie Pyo Colvin Caroli.Ed [employer] [phone]

## 2017-05-02 NOTE — Progress Notes (Addendum)
PROGRESS NOTE    Richard Bennett  HEN:[id number] DOB: March 17, 1950 DOA: 04/11/2017 PCP: [name], MD   Brief Narrative:   67 y.o. 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  Per the report patient was wandering around and is nonverbal and later found to have a generalized tonic-clonic seizure.  EMS was called and patient was given Versed.  No further history is available as family is not able to be reached.  Patient is encephalopathic.     Subjective: 3/1 eyes open spontaneously moves left upper extremity does not follow commands. Nonverbal. Mild withdraw to painful stimuli lateral lower extremity.    Assessment & Plan:   Principal Problem:   Status epilepticus ([place]) Active Problems:   Hypertensive emergency   Seizure ([place])   CKD (chronic kidney disease) stage 3, GFR 30-59 ml/min (HCC)   Hyperosmolar non-ketotic state in patient with type 2 diabetes mellitus ([name])   Acute respiratory failure with hypoxia ([place])   Acute encephalopathy   Cerebral thrombosis with cerebral infarction   Dysphagia   Goals of care, counseling/discussion   Palliative care encounter  Acute Bilateral Watershed infarct -Secondary to cocaine use -Patient unlikely to recover any meaningful function..   Acute respiratory failure with hypoxia -Resolved -Titrate O2 to maintain SPO2> 93%   Acute encephalopathy -Opens eyes, spontaneously moves LUE. Otherwise unresponsive.  Seizures -Keppra 500 mg  BID  -Seizure protocol  Essential HTN -Amlodipine 10 mg daily -3/1 increase clonidine 0.2 mg BID -Labetalol 200 mg TID -Hydralazine PRN  Dysphagia -Secondary to bilateral watershed infarct.  -2/26  S/P PEG tube placement . -3/1 failed Modified Barium Swallow study.  Polysubstance abuse  -On admission positive for  cocaine and benzodiazepine  Anemia chronic disease -No serious signs of bleeding. Some dried blood around right nare. -Anemia panel: Most consistent with anemia of chronic disease Recent Labs  Lab 04/26/17 0714 04/27/17 0234 04/29/17 0242 05/01/17 0304  HGB 8.5* 8.0* 8.4* 8.2*  -Stable  RUE swelling -Doppler pending: R/O DVT.     Goals of care -2/24 PALLIATIVE CARE: Consult placed.Bilateral Watershed infarct, eyes open unresponsive. Family with on realistic expectations. Address CODE STATUS should be DO NOT RESUSCITATE,     DVT prophylaxis: Subcutaneous heparin Code Status: Full Family Communication: None Disposition Plan: SNF; awaiting placement.      Consultants:  Stroke team [facility]     Procedures/Significant Events:  2/08  Admit with acute encephalopathy, seizures, hyperglycemia, and HTN emergency 2/09  Started on Dilantin per neurology  2/10  Sedated and now with right hemiparesis for an MRI 2/14  Hemodialysis 2/15  Tolerating CPAP/PS but not following commands 2/20  Had to be placed back on vent support, still not following commands 2/22  Extubated  2/26 PEG tube placement 3/1 Modified Barium Swallow study. Failed     I have personally reviewed and interpreted all radiology studies and my findings are as above.  VENTILATOR SETTINGS: None   Cultures 2/8 blood negative   Antimicrobials: Anti-infectives (From admission, onward)   Start     Stop   04/11/17 1345  Ampicillin-Sulbactam (UNASYN) 3 g in sodium chloride 0.9 % 100 mL IVPB     04/17/17 1315   04/11/17 0730  Ampicillin-Sulbactam (UNASYN) 3 g in sodium chloride 0.9 % 100 mL IVPB  Status:  Discontinued     04/11/17 1330  Devices   LINES / TUBES:  ETT 2/8 >> 2/22 OG tube 2/8 >> 2/22  R IJ HD cath 2/13 >> 2/22 Foley 2/10 (placed by urology secondary to urethral stricture) >> out  PEG tube 2/26>>    Continuous Infusions: . feeding supplement (JEVITY 1.5 CAL/FIBER) 1,000  mL (05/01/17 2152)     Objective: Vitals:   05/01/17 1910 05/01/17 2335 05/02/17 0330 05/02/17 0716  BP: (!) 155/78 (!) 153/86 (!) 145/95 (!) 170/67  Pulse: 70 68 70 72  Resp: _0 Temp: 99.2 F (37.3 C) (!) 97 F (36.1 C) 97.8 F (36.6 C) 98.2 F (36.8 C)  TempSrc: Axillary Axillary Oral Oral  SpO2: 100% 100% 100% 100%  Weight:   159 lb 13.3 oz (72.5 kg)   Height:        Intake/Output Summary (Last 24 hours) at 05/02/2017 5956 Last data filed at 05/02/2017 3875 Gross per 24 hour  Intake 1150 ml  Output 475 ml  Net 675 ml   Filed Weights   04/30/17 0342 05/01/17 0509 05/02/17 0330  Weight: 156 lb 15.5 oz (71.2 kg) 158 lb 11.7 oz (72 kg) 159 lb 13.3 oz (72.5 kg)    Physical Exam:  General: Eyes open, noncommunicative, No acute respiratory distress Neck:  Negative scars, masses, torticollis, lymphadenopathy, JVD Lungs: Clear to auscultation bilaterally without wheezes or crackles Cardiovascular: Regular rate and rhythm without murmur gallop or rub normal S1 and S2 Abdomen: negative abdominal pain, nondistended, positive soft, bowel sounds, no rebound, no ascites, no appreciable mass Extremities: No significant cyanosis, clubbing, or edema bilateral lower extremities Skin: Negative rashes, lesions, ulcers Psychiatric:  Unable to evaluate secondary to CVA  Central nervous system:  Eyes open, spontaneously moves LUE, does not respond to painful stimuli except in LUE. Nonverbal, does not follow commands.  .     Data Reviewed: Care during the described time interval was provided by me .  I have reviewed this patient's available data, including medical history, events of note, physical examination, and all test results as part of my evaluation.   CBC: Recent Labs  Lab 04/26/17 0714 04/27/17 0234 04/29/17 0242 05/01/17 0304  WBC 7.5 8.5 6.5 7.0  HGB 8.5* 8.0* 8.4* 8.2*  HCT 27.1* 25.6* 27.1* 26.1*  MCV 86.0 85.9 87.7 87.3  PLT 279 282 294 643   Basic  Metabolic Panel: Recent Labs  Lab 04/26/17 0714 04/27/17 0234 04/28/17 0213 04/29/17 0242 04/30/17 0925 05/01/17 0304  NA 141 141 146* 150* 152* 149*  K 3.7 3.9 3.6 3.7 4.5 4.4  CL 101 105 108 117* 122* 120*  CO2 _1 21* 18*  GLUCOSE 171* 178* 122* 137* 200* 228*  BUN 113* 113* 109* 90* 71* 62*  CREATININE 3.02* 2.71* 2.63* 2.34* 2.43* 2.20*  CALCIUM 8.7* 8.5* 8.6* 8.6* 8.5* 8.5*  MG  --   --  2.7*  --   --   --   PHOS 5.5* 4.7* 3.9  --   --   --    GFR: Estimated Creatinine Clearance: 32 mL/min (A) (by C-G formula based on SCr of 2.2 mg/dL (H)). Liver Function Tests: Recent Labs  Lab 04/26/17 0714 04/27/17 0234 04/28/17 0213 04/30/17 0925  AST  --   --   --  46*  ALT  --   --   --  32  ALKPHOS  --   --   --  214*  BILITOT  --   --   --  0.5  PROT  --   --   --  6.2*  ALBUMIN 2.2* 2.2* 2.1* 2.1*   No results for input(s): LIPASE, AMYLASE in the last 168 hours. No results for input(s): AMMONIA in the last 168 hours. Coagulation Profile: Recent Labs  Lab 04/28/17 0213  INR 1.09   Cardiac Enzymes: No results for input(s): CKTOTAL, CKMB, CKMBINDEX, TROPONINI in the last 168 hours. BNP (last 3 results) No results for input(s): PROBNP in the last 8760 hours. HbA1C: No results for input(s): HGBA1C in the last 72 hours. CBG: Recent Labs  Lab 05/01/17 1557 05/01/17 1611 05/01/17 2001 05/01/17 2337 05/02/17 0332  GLUCAP 220* 249* 197* 265* 247*   Lipid Profile: No results for input(s): CHOL, HDL, LDLCALC, TRIG, CHOLHDL, LDLDIRECT in the last 72 hours. Thyroid Function Tests: No results for input(s): TSH, T4TOTAL, FREET4, T3FREE, THYROIDAB in the last 72 hours. Anemia Panel: No results for input(s): VITAMINB12, FOLATE, FERRITIN, TIBC, IRON, RETICCTPCT in the last 72 hours. Urine analysis:    Component Value Date/Time   COLORURINE STRAW (A) 04/11/2017 0447   APPEARANCEUR CLEAR 04/11/2017 0447   LABSPEC 1.013 04/11/2017 0447   PHURINE 7.0 04/11/2017  0447   GLUCOSEU >=500 (A) 04/11/2017 0447   HGBUR SMALL (A) 04/11/2017 0447   BILIRUBINUR NEGATIVE 04/11/2017 0447   KETONESUR NEGATIVE 04/11/2017 0447   PROTEINUR 100 (A) 04/11/2017 0447   NITRITE NEGATIVE 04/11/2017 0447   LEUKOCYTESUR NEGATIVE 04/11/2017 0447   Sepsis Labs: _0 (procalcitonin:4,lacticidven:4)  )No results found for this or any previous visit (from the past 240 hour(s)).       Radiology Studies: No results found.      Scheduled Meds: . amLODipine  10 mg Per Tube Daily  . aspirin  325 mg Per Tube Daily  . atorvastatin  40 mg Per Tube q1800  . chlorhexidine gluconate (MEDLINE KIT)  15 mL Mouth Rinse BID  . cloNIDine  0.2 mg Per Tube BID  . feeding supplement (PRO-STAT SUGAR FREE 64)  30 mL Per Tube Daily  . folic acid  1 mg Per Tube Daily  . free water  250 mL Per Tube Q4H  . heparin  5,000 Units Subcutaneous Q8H  . insulin aspart  0-20 Units Subcutaneous Q4H  . labetalol  200 mg Per Tube Q8H  . levETIRAcetam  500 mg Per Tube BID  . mouth rinse  15 mL Mouth Rinse q12n4p  . neomycin-bacitracin-polymyxin  1 application Topical Daily  . thiamine  100 mg Per Tube Daily   Continuous Infusions: . feeding supplement (JEVITY 1.5 CAL/FIBER) 1,000 mL (05/01/17 2152)     LOS: 21 days    Time spent: 40 minutes    WOODS, Geraldo Docker, MD [facility] Pager [phone]   If 7PM-7AM, please contact night-coverage [email or web address] Password [email or web address] 05/02/2017, 8:22 AM

## 2017-05-02 NOTE — Progress Notes (Signed)
Right upper extremity venous duplex has been completed. Negative for DVT. There is evidence of acute deep vein thrombosis involving the basilic vein of the right upper extremity. Results were given to the patient's nurse, Anda Kraft.  05/02/17 3:17 PM Richard Bennett RVT

## 2017-05-02 NOTE — Clinical Social Work Note (Addendum)
Patient's brother called asking if there was any way we could assist with him becoming HCPOA. CSW explained that since patient is not fully alert and oriented and not able to verbalize that he wants him to become HCPOA, legally we cannot proceed with that paperwork. Patient's brother stated DSS is requesting paperwork for that to proceed in regards to his pension. Patient's brother stated he called a lawyer regarding the matter but they gave him a phone number for another lawyer to contact. He has not called her yet but will do so. Patient's brother and sister-in-law asked when patient would be moved and would prefer for it to occur on a weekday. CSW explained that guarantees can be made but that CSW and the facility are waiting on confirmation from DSS that the Medicaid application is officially pending.  Dayton Scrape, [place] [phone]  11:55 am CSW left another voicemail for [facility] worker, asking that she call either this CSW or the SNF admissions coordinator. CSW emphasized that this is the only thing we are waiting on for discharge. Per SNF clinical liaison, they start authorization with his [facility] and should have a determination today. If approved, he can discharge to the facility today and they can try to reach the DSS social worker later.  Dayton Scrape, [place]  3:56 pm SNF has not received an authorization decision from [facility] yet. CSW attempted calling Medicaid worker again. Did not leave another voicemail. CSW called Medicaid worker's supervisor. He stated he told her to call yesterday. CSW asked him to have her call SNF admissions coordinator to provide needed information.  Dayton Scrape, [place]

## 2017-05-03 DIAGNOSIS — L8990 Pressure ulcer of unspecified site, unspecified stage: Secondary | ICD-10-CM

## 2017-05-03 LAB — GLUCOSE, CAPILLARY
GLUCOSE-CAPILLARY: 177 mg/dL — AB (ref 65–99)
GLUCOSE-CAPILLARY: 291 mg/dL — AB (ref 65–99)
Glucose-Capillary: 212 mg/dL — ABNORMAL HIGH (ref 65–99)
Glucose-Capillary: 178 mg/dL — ABNORMAL HIGH (ref 65–99)
Glucose-Capillary: 323 mg/dL — ABNORMAL HIGH (ref 65–99)

## 2017-05-03 LAB — HEPARIN LEVEL (UNFRACTIONATED): HEPARIN UNFRACTIONATED: 0.5 [IU]/mL (ref 0.30–0.70)

## 2017-05-03 MED ORDER — ACETAMINOPHEN 325 MG PO TABS
650.0000 mg | ORAL_TABLET | Freq: Four times a day (QID) | ORAL | Status: DC | PRN
  Administered 2017-05-03 – 2017-05-07 (×6): 650 mg
  Filled 2017-05-03 (×7): qty 2

## 2017-05-03 MED ORDER — HEPARIN (PORCINE) IN NACL 100-0.45 UNIT/ML-% IJ SOLN
750.0000 [IU]/h | INTRAMUSCULAR | Status: DC
  Administered 2017-05-03: 800 [IU]/h via INTRAVENOUS
  Filled 2017-05-03: qty 250

## 2017-05-03 NOTE — Progress Notes (Addendum)
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 EMS was called and the patient was given Versed.   Significant Events: 2/08Admit withacute encephalopathy, seizures,hyperglycemia, and HTN emergency - intubated 2/09 Started on Dilantin per Neurology  2/10Sedated - right hemiparesis > MRI - foley placed by Urology (urethral stricture) 2/14Hemodialysis 2/15 Tolerating CPAP/PS but not following commands 2/20Had to be placed back on vent support, still not following commands 2/22 Extubated   Subjective: Opens eyes - does not clearly focus on or follow the examiner.  No evident resp distress or pain.  No family present.    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  Secondary to cocaine use - has stabilized - not expected to improve significantly beyond his present state - needs lifelong SNF placement   R basilic vein non-occlusive DVT Noted on venous duplex 05/02/17 - I find no evidence of PICC in the R UE this hospital stay - anticoagulate w/ heparin, and if no bleeding will transition to elquis in 24-48hrs  Dysphagia SLP following - remains NPO - now s/p PEG tube   Acute hypoxic respiratory failure in setting of seizure activity  Resolved - sats 100% on RA    Acute encephalopathy Remains noncommunicative - suspect he suffered significant brain injury due to his seizure and strokes - minimized all sedating meds in a stepwise fashion - do not expect signif improvement beyond his present state   Acute kidney failure  Due to above - crt seems to be trending down again - follow trend - BUN much improved at this time w/o signif  change in mental status   Hypernatremia Due to [facility] - corrected w/ free water via PEG   Seizures Appear controlled for now   Cocaine abuse  UDS positive for cocaine and benzos (given by EMS) at time of admit   Anemia No signs of bleeding - most c/w ACD  DM2 A1c 11.8 04/17/17 - CBG controlled   DVT prophylaxis: SQ heparin  Code Status: FULL CODE Family Communication: no family present at time of exam  Disposition Plan: SDU   Consultants:  Neurology  PCCM  Antimicrobials:  None    Objective: Blood pressure 133/80, pulse 69, temperature 98.8 F (37.1 C), temperature source Oral, resp. rate 13, height 5' 8"  (1.727 m), weight 73.4 kg (161 lb 13.1 oz), SpO2 100 %.  Intake/Output Summary (Last 24 hours) at 05/03/2017 1022 Last data filed at 05/03/2017 0800 Gross per 24 hour  Intake 1100 ml  Output 1425 ml  Net -325 ml   Filed Weights   05/01/17 0509 05/02/17 0330 05/03/17 0340  Weight: 72 kg (158 lb 11.7 oz) 72.5 kg (159 lb 13.3 oz) 73.4 kg (161 lb 13.1 oz)    Examination: General: No acute respiratory distress Lungs: CTA B  Cardiovascular: RRR - no M  Abdomen: NT/ND, soft, bs+, no mass  Extremities: No edema B LE   CBC: Recent Labs  Lab 04/27/17 0234 04/29/17 0242 05/01/17 0304 05/02/17 1012  WBC 8.5 6.5 7.0 8.2  HGB 8.0* 8.4* 8.2* 8.1*  HCT 25.6* 27.1* 26.1* 26.3*  MCV 85.9 87.7 87.3 86.5  PLT 282  294 292 595   Basic Metabolic Panel: Recent Labs  Lab 04/27/17 0234 04/28/17 0213 04/29/17 0242 04/30/17 0925 05/01/17 0304 05/02/17 1012  NA 141 146* 150* 152* 149* 145  K 3.9 3.6 3.7 4.5 4.4 4.4  CL 105 108 117* 122* 120* 113*  CO2 23 25 22  21* 18* 21*  GLUCOSE 178* 122* 137* 200* 228* 152*  BUN 113* 109* 90* 71* 62* 55*  CREATININE 2.71* 2.63* 2.34* 2.43* 2.20* 1.99*  CALCIUM 8.5* 8.6* 8.6* 8.5* 8.5* 8.7*  MG  --  2.7*  --   --   --  2.3  PHOS 4.7* 3.9  --   --   --   --    GFR: Estimated Creatinine Clearance: 35.3 mL/min (A) (by C-G formula  based on SCr of 1.99 mg/dL (H)).  Liver Function Tests: Recent Labs  Lab 04/27/17 0234 04/28/17 0213 04/30/17 0925  AST  --   --  46*  ALT  --   --  32  ALKPHOS  --   --  214*  BILITOT  --   --  0.5  PROT  --   --  6.2*  ALBUMIN 2.2* 2.1* 2.1*    Coagulation Profile: Recent Labs  Lab 04/28/17 0213  INR 1.09    HbA1C: Hgb A1c MFr Bld  Date/Time Value Ref Range Status  04/17/2017 08:30 AM 11.8 (H) 4.8 - 5.6 % Final    Comment:    (NOTE) Pre diabetes:          5.7%-6.4% Diabetes:              >6.4% Glycemic control for   <7.0% adults with diabetes   07/25/2016 04:38 AM 11.9 (H) 4.8 - 5.6 % Final    Comment:    (NOTE)         Pre-diabetes: 5.7 - 6.4         Diabetes: >6.4         Glycemic control for adults with diabetes: <7.0     CBG: Recent Labs  Lab 05/02/17 1642 05/02/17 2008 05/02/17 2318 05/03/17 0342 05/03/17 0759  GLUCAP 230* 200* 190* 212* 177*     Scheduled Meds: . amLODipine  10 mg Per Tube Daily  . aspirin  325 mg Per Tube Daily  . atorvastatin  40 mg Per Tube q1800  . chlorhexidine gluconate (MEDLINE KIT)  15 mL Mouth Rinse BID  . cloNIDine  0.3 mg Per Tube BID  . feeding supplement (PRO-STAT SUGAR FREE 64)  30 mL Per Tube Daily  . folic acid  1 mg Per Tube Daily  . free water  250 mL Per Tube Q4H  . heparin  5,000 Units Subcutaneous Q8H  . insulin aspart  0-20 Units Subcutaneous Q4H  . labetalol  200 mg Per Tube Q8H  . levETIRAcetam  500 mg Per Tube BID  . mouth rinse  15 mL Mouth Rinse q12n4p  . neomycin-bacitracin-polymyxin  1 application Topical Daily  . thiamine  100 mg Per Tube Daily     LOS: 22 days   [name], MD [facility] Office  [phone] Pager - Text Page per Amion as per below:  On-Call/Text Page:      [email or web address]      password [email or web address]  If 7PM-7AM, please contact night-coverage [email or web address] Password [email or web address] 05/03/2017, 10:22 AM

## 2017-05-03 NOTE — Progress Notes (Signed)
ANTICOAGULATION CONSULT NOTE  Pharmacy Consult for IV heparin Indication: new DVT, also w/ recent stroke  No Known Allergies  Patient Measurements: Height: 5\' 8"  (172.7 cm) Weight: 161 lb 13.1 oz (73.4 kg) IBW/kg (Calculated) : 68.4 Heparin Dosing Weight: 73.4  Vital Signs: Temp: 98.1 F (36.7 C) (03/02 1939) Temp Source: Oral (03/02 1939) BP: 161/72 (03/02 1939) Pulse Rate: 68 (03/02 1939)  Labs: Recent Labs    05/01/17 0304 05/02/17 1012 05/03/17 2014  HGB 8.2* 8.1*  --   HCT 26.1* 26.3*  --   PLT 292 302  --   HEPARINUNFRC  --   --  0.50  CREATININE 2.20* 1.99*  --     Estimated Creatinine Clearance: 35.3 mL/min (A) (by C-G formula based on SCr of 1.99 mg/dL (H)).   Medical History: Past Medical History:  Diagnosis Date  . Chest pain   . Colon polyps    adenomatous  . Hyperlipidemia   . Hypertension   . Refusal of blood transfusions as patient is Jehovah's Witness   . Stroke ([place])   . Type II diabetes mellitus (HCC)     Medications:  Infusions:  . feeding supplement (JEVITY 1.5 CAL/FIBER) 1,000 mL (05/03/17 1700)  . heparin 800 Units/hr (05/03/17 1600)    Assessment: 67 yo male admitted with bilateral watershed infarcts, and found with DVT in R basilic vein. Pharmacy asked to begin anticoagulation with IV heparin.  Planning to transition to Eliquis soon.   Initial heparin level therapeutic at 0.5 but at top of the range. CBC stable. No bleed or IV line issues per RN.  Goal of Therapy:  Heparin level 0.3-0.5 given recent stroke Monitor platelets by anticoagulation protocol: Yes   Plan:  1. Reduce IV heparin gtt slightly to 750 units/hr to ensure stays in range 3. Daily heparin level and CBC, monitor for s/sx bleeding 4. F/u plans to start Eliquis.  Elicia Lamp, PharmD, BCPS Clinical Pharmacist 05/03/2017 9:27 PM

## 2017-05-03 NOTE — Clinical Social Work Note (Signed)
Facility needs PT notes for insurance authorization. RN notified.   [place], [place]

## 2017-05-03 NOTE — Progress Notes (Signed)
ANTICOAGULATION CONSULT NOTE - Initial Consult  Pharmacy Consult for IV heparin Indication: new DVT, also w/ recent stroke  No Known Allergies  Patient Measurements: Height: 5\' 8"  (172.7 cm) Weight: 161 lb 13.1 oz (73.4 kg) IBW/kg (Calculated) : 68.4 Heparin Dosing Weight: 73.4  Vital Signs: BP: 133/80 (03/02 0500) Pulse Rate: 69 (03/02 0340)  Labs: Recent Labs    05/01/17 0304 05/02/17 1012  HGB 8.2* 8.1*  HCT 26.1* 26.3*  PLT 292 302  CREATININE 2.20* 1.99*    Estimated Creatinine Clearance: 35.3 mL/min (A) (by C-G formula based on SCr of 1.99 mg/dL (H)).   Medical History: Past Medical History:  Diagnosis Date  . Chest pain   . Colon polyps    adenomatous  . Hyperlipidemia   . Hypertension   . Refusal of blood transfusions as patient is Jehovah's Witness   . Stroke ([place])   . Type II diabetes mellitus (HCC)     Medications:  Infusions:  . feeding supplement (JEVITY 1.5 CAL/FIBER) 1,000 mL (05/02/17 1751)    Assessment: 67 yo male admitted with bilateral watershed infarcts, and found with DVT in R basilic vein. Pharmacy asked to begin anticoagulation with IV heparin.  Planning to transition to Eliquis soon.   Goal of Therapy:  Heparin level 0.3-0.5 given recent stroke Monitor platelets by anticoagulation protocol: Yes   Plan:  1. Start IV heparin gtt at rate of 800 units/hr.  No bolus. 2. Check heparin level 8 hrs after gtt starts. 3. Daily heparin level and CBC. 4. F/u plans to start Eliquis.  Uvaldo Rising, BCPS  Clinical Pharmacist Pager [phone]  05/03/2017 11:27 AM

## 2017-05-04 LAB — BASIC METABOLIC PANEL
Anion gap: 10 (ref 5–15)
BUN: 61 mg/dL — AB (ref 6–20)
CALCIUM: 8.3 mg/dL — AB (ref 8.9–10.3)
CO2: 21 mmol/L — AB (ref 22–32)
Chloride: 113 mmol/L — ABNORMAL HIGH (ref 101–111)
Creatinine, Ser: 2.01 mg/dL — ABNORMAL HIGH (ref 0.61–1.24)
GFR calc Af Amer: 38 mL/min — ABNORMAL LOW (ref >60)
GFR, EST NON AFRICAN AMERICAN: 33 mL/min — AB (ref >60)
GLUCOSE: 172 mg/dL — AB (ref 65–99)
Potassium: 4.3 mmol/L (ref 3.5–5.1)
Sodium: 144 mmol/L (ref 135–145)

## 2017-05-04 LAB — CBC
HCT: 25.1 % — ABNORMAL LOW (ref 39.0–52.0)
Hemoglobin: 7.8 g/dL — ABNORMAL LOW (ref 13.0–17.0)
MCH: 26.5 pg (ref 26.0–34.0)
MCHC: 31.1 g/dL (ref 30.0–36.0)
MCV: 85.4 fL (ref 78.0–100.0)
PLATELETS: 248 10*3/uL (ref 150–400)
RBC: 2.94 MIL/uL — ABNORMAL LOW (ref 4.22–5.81)
RDW: 13.7 % (ref 11.5–15.5)
WBC: 8.8 10*3/uL (ref 4.0–10.5)

## 2017-05-04 LAB — GLUCOSE, CAPILLARY
GLUCOSE-CAPILLARY: 231 mg/dL — AB (ref 65–99)
GLUCOSE-CAPILLARY: 183 mg/dL — AB (ref 65–99)
GLUCOSE-CAPILLARY: 166 mg/dL — AB (ref 65–99)
GLUCOSE-CAPILLARY: 231 mg/dL — AB (ref 65–99)
Glucose-Capillary: 197 mg/dL — ABNORMAL HIGH (ref 65–99)
Glucose-Capillary: 183 mg/dL — ABNORMAL HIGH (ref 65–99)
Glucose-Capillary: 231 mg/dL — ABNORMAL HIGH (ref 65–99)

## 2017-05-04 LAB — HEPARIN LEVEL (UNFRACTIONATED): Heparin Unfractionated: 0.5 IU/mL (ref 0.30–0.70)

## 2017-05-04 MED ORDER — HEPARIN (PORCINE) IN NACL 100-0.45 UNIT/ML-% IJ SOLN
800.0000 [IU]/h | INTRAMUSCULAR | Status: DC
  Administered 2017-05-04: 700 [IU]/h via INTRAVENOUS
  Administered 2017-05-06: 800 [IU]/h via INTRAVENOUS
  Filled 2017-05-04 (×2): qty 250

## 2017-05-04 MED ORDER — HYDRALAZINE HCL 20 MG/ML IJ SOLN
10.0000 mg | Freq: Four times a day (QID) | INTRAMUSCULAR | Status: DC | PRN
  Filled 2017-05-04: qty 1

## 2017-05-04 MED ORDER — POLYETHYLENE GLYCOL 3350 17 G PO PACK: 17.0000 g | PACK | Freq: Every day | ORAL | Status: DC

## 2017-05-04 MED ORDER — CHLORHEXIDINE GLUCONATE 0.12 % MT SOLN
15.0000 mL | Freq: Two times a day (BID) | OROMUCOSAL | Status: DC
Start: 2017-05-04 — End: 2017-05-13
  Administered 2017-05-04 – 2017-05-13 (×17): 15 mL via OROMUCOSAL
  Filled 2017-05-04 (×16): qty 15

## 2017-05-04 MED ORDER — POLYETHYLENE GLYCOL 3350 17 G PO PACK
17.0000 g | PACK | Freq: Every day | ORAL | Status: DC | PRN
  Administered 2017-05-04: 17 g via ORAL

## 2017-05-04 NOTE — Progress Notes (Signed)
ANTICOAGULATION CONSULT NOTE  Pharmacy Consult for IV heparin Indication: new DVT, also w/ recent stroke  No Known Allergies  Patient Measurements: Height: 5\' 8"  (172.7 cm) Weight: 161 lb 13.1 oz (73.4 kg) IBW/kg (Calculated) : 68.4 Heparin Dosing Weight: 73.4  Vital Signs: Temp: 98.4 F (36.9 C) (03/03 0748) Temp Source: Oral (03/03 0748) BP: 168/86 (03/03 0748) Pulse Rate: 71 (03/03 0748)  Labs: Recent Labs    05/02/17 1012 05/03/17 2014 05/04/17 0253  HGB 8.1*  --  7.8*  HCT 26.3*  --  25.1*  PLT 302  --  248  HEPARINUNFRC  --  0.50 0.50  CREATININE 1.99*  --  2.01*    Estimated Creatinine Clearance: 35 mL/min (A) (by C-G formula based on SCr of 2.01 mg/dL (H)).   Medical History: Past Medical History:  Diagnosis Date  . Chest pain   . Colon polyps    adenomatous  . Hyperlipidemia   . Hypertension   . Refusal of blood transfusions as patient is Jehovah's Witness   . Stroke ([place])   . Type II diabetes mellitus (HCC)     Medications:  Infusions:  . feeding supplement (JEVITY 1.5 CAL/FIBER) 1,000 mL (05/03/17 1700)  . heparin 750 Units/hr (05/03/17 2136)    Assessment: 67 yo male admitted with bilateral watershed infarcts, and found with DVT in R basilic vein. Pharmacy asked to begin anticoagulation with IV heparin.  Planning to transition to Eliquis soon.   Today's heparin level therapeutic at 0.5 but at top of the range. CBC stable. No bleed or IV line issues per RN.  Goal of Therapy:  Heparin level 0.3-0.5 given recent stroke Monitor platelets by anticoagulation protocol: Yes   Plan:  1. Reduce IV heparin gtt slightly to 700 units/hr to ensure stays in range 3. Daily heparin level and CBC, monitor for s/sx bleeding 4. F/u plans to start Eliquis.  Uvaldo Rising, BCPS  Clinical Pharmacist Pager [phone]  05/04/2017 8:35 AM

## 2017-05-04 NOTE — Progress Notes (Signed)
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 EMS was called and the patient was given Versed.   Significant Events: 2/08Admit withacute encephalopathy, seizures,hyperglycemia, and HTN emergency - intubated 2/09 Started on Dilantin per Neurology  2/10Sedated - right hemiparesis > MRI - foley placed by Urology (urethral stricture) 2/14Hemodialysis 2/15 Tolerating CPAP/PS but not following commands 2/20Had to be placed back on vent support, still not following commands 2/22 Extubated   Subjective: No change in clinical status.    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  likely secondary to bilateral carotid stenosis in the setting of episodic hypotension with hypertensive emergency secondary to cocaine use - has stabilized - not expected to improve significantly beyond his present state - needs lifelong SNF placement   B carotid stenosis  carotid doppler this admit noted left CCA > 50%, left ICA 60-79%, and right ICA 40-59% stenosis - previously noted as early as 07/2106 on MRI  HTN BP not at goal - adjust tx and follow - goal is 140-160 in setting of B carotid stenosis and watershed infarcts   R basilic vein non-occlusive DVT Noted on venous duplex 05/02/17 - I find no evidence of PICC in the R UE this hospital stay - anticoagulate w/ heparin, and if no bleeding will transition to elquis in 24-48hrs  Dysphagia SLP following - remains NPO - now s/p PEG tube w/ ongoing tube feeding   Acute hypoxic respiratory failure in setting of seizure activity  Resolved - sats 100% on RA    Acute  encephalopathy Remains noncommunicative - suspect he suffered significant brain injury due to his seizure and strokes - minimized all sedating meds in a stepwise fashion - do not expect signif improvement beyond his present state   Acute kidney failure  Due to above - BUN much improved at this time w/o signif change in mental status   Recent Labs  Lab 04/29/17 0242 04/30/17 0925 05/01/17 0304 05/02/17 1012 05/04/17 0253  CREATININE 2.34* 2.43* 2.20* 1.99* 2.01*    Hypernatremia Due to [facility] - corrected w/ free water via PEG   Seizures Appear controlled for now   Cocaine abuse  UDS positive for cocaine and benzos (given by EMS) at time of admit   Anemia No signs of bleeding - most c/w ACD  DM2 A1c 11.8 04/17/17 - CBG controlled   DVT prophylaxis: SQ heparin  Code Status: FULL CODE Family Communication: no family present at time of exam  Disposition Plan: SDU   Consultants:  Neurology  PCCM  Antimicrobials:  None    Objective: Blood pressure (!) 168/86, pulse 71, temperature 98.4 F (36.9 C), temperature source Oral, resp. rate 20, height 5' 8"  (1.727 m), weight 72.8 kg (160 lb 7.9 oz), SpO2 100 %.  Intake/Output Summary (Last 24 hours) at 05/04/2017 1118 Last data filed at 05/04/2017 0000 Gross per 24 hour  Intake 1774 ml  Output 1225 ml  Net 549 ml   Filed Weights   05/02/17 0330 05/03/17 0340 05/04/17 0615  Weight: 72.5 kg (159 lb 13.3 oz) 73.4 kg (161 lb 13.1 oz) 72.8 kg (160 lb  7.9 oz)    Examination: General: No acute respiratory distress Lungs: CTA B w/o wheezing  Cardiovascular: RRR - no M or rub  Abdomen: NT/ND, soft, bs+, no mass  Extremities: No signif edema B LE   CBC: Recent Labs  Lab 04/29/17 0242 05/01/17 0304 05/02/17 1012 05/04/17 0253  WBC 6.5 7.0 8.2 8.8  HGB 8.4* 8.2* 8.1* 7.8*  HCT 27.1* 26.1* 26.3* 25.1*  MCV 87.7 87.3 86.5 85.4  PLT 294 292 302 355   Basic Metabolic Panel: Recent Labs  Lab 04/28/17 0213 04/29/17 0242  04/30/17 0925 05/01/17 0304 05/02/17 1012 05/04/17 0253  NA 146* 150* 152* 149* 145 144  K 3.6 3.7 4.5 4.4 4.4 4.3  CL 108 117* 122* 120* 113* 113*  CO2 25 22 21* 18* 21* 21*  GLUCOSE 122* 137* 200* 228* 152* 172*  BUN 109* 90* 71* 62* 55* 61*  CREATININE 2.63* 2.34* 2.43* 2.20* 1.99* 2.01*  CALCIUM 8.6* 8.6* 8.5* 8.5* 8.7* 8.3*  MG 2.7*  --   --   --  2.3  --   PHOS 3.9  --   --   --   --   --    GFR: Estimated Creatinine Clearance: 35 mL/min (A) (by C-G formula based on SCr of 2.01 mg/dL (H)).  Liver Function Tests: Recent Labs  Lab 04/28/17 0213 04/30/17 0925  AST  --  46*  ALT  --  32  ALKPHOS  --  214*  BILITOT  --  0.5  PROT  --  6.2*  ALBUMIN 2.1* 2.1*    Coagulation Profile: Recent Labs  Lab 04/28/17 0213  INR 1.09    HbA1C: Hgb A1c MFr Bld  Date/Time Value Ref Range Status  04/17/2017 08:30 AM 11.8 (H) 4.8 - 5.6 % Final    Comment:    (NOTE) Pre diabetes:          5.7%-6.4% Diabetes:              >6.4% Glycemic control for   <7.0% adults with diabetes   07/25/2016 04:38 AM 11.9 (H) 4.8 - 5.6 % Final    Comment:    (NOTE)         Pre-diabetes: 5.7 - 6.4         Diabetes: >6.4         Glycemic control for adults with diabetes: <7.0     CBG: Recent Labs  Lab 05/03/17 1628 05/03/17 1941 05/03/17 2320 05/04/17 0335 05/04/17 0745  GLUCAP 323* 291* 197* 183* 231*     Scheduled Meds: . amLODipine  10 mg Per Tube Daily  . aspirin  325 mg Per Tube Daily  . atorvastatin  40 mg Per Tube q1800  . chlorhexidine gluconate (MEDLINE KIT)  15 mL Mouth Rinse BID  . cloNIDine  0.3 mg Per Tube BID  . feeding supplement (PRO-STAT SUGAR FREE 64)  30 mL Per Tube Daily  . folic acid  1 mg Per Tube Daily  . free water  250 mL Per Tube Q4H  . insulin aspart  0-20 Units Subcutaneous Q4H  . labetalol  200 mg Per Tube Q8H  . levETIRAcetam  500 mg Per Tube BID  . mouth rinse  15 mL Mouth Rinse q12n4p  . neomycin-bacitracin-polymyxin  1 application Topical  Daily  . thiamine  100 mg Per Tube Daily     LOS: 23 days   [name], MD [facility] Office  [phone] Pager - Text Page per Shea Evans  as per below:  On-Call/Text Page:      [email or web address]      password [email or web address]  If 7PM-7AM, please contact night-coverage [email or web address] Password [email or web address] 05/04/2017, 11:18 AM

## 2017-05-05 ENCOUNTER — Inpatient Hospital Stay (HOSPITAL_COMMUNITY): Payer: Medicare HMO

## 2017-05-05 LAB — CBC
HCT: 26.9 % — ABNORMAL LOW (ref 39.0–52.0)
HEMOGLOBIN: 8.5 g/dL — AB (ref 13.0–17.0)
MCH: 26.9 pg (ref 26.0–34.0)
MCHC: 31.6 g/dL (ref 30.0–36.0)
MCV: 85.1 fL (ref 78.0–100.0)
PLATELETS: 256 10*3/uL (ref 150–400)
RBC: 3.16 MIL/uL — AB (ref 4.22–5.81)
RDW: 13.6 % (ref 11.5–15.5)
WBC: 11.8 10*3/uL — AB (ref 4.0–10.5)

## 2017-05-05 LAB — GLUCOSE, CAPILLARY
GLUCOSE-CAPILLARY: 200 mg/dL — AB (ref 65–99)
GLUCOSE-CAPILLARY: 174 mg/dL — AB (ref 65–99)
GLUCOSE-CAPILLARY: 209 mg/dL — AB (ref 65–99)
GLUCOSE-CAPILLARY: 278 mg/dL — AB (ref 65–99)
GLUCOSE-CAPILLARY: 219 mg/dL — AB (ref 65–99)
Glucose-Capillary: 273 mg/dL — ABNORMAL HIGH (ref 65–99)
Glucose-Capillary: 238 mg/dL — ABNORMAL HIGH (ref 65–99)

## 2017-05-05 LAB — HEPARIN LEVEL (UNFRACTIONATED)
Heparin Unfractionated: 0.17 IU/mL — ABNORMAL LOW (ref 0.30–0.70)
Heparin Unfractionated: 0.3 IU/mL (ref 0.30–0.70)
Heparin Unfractionated: 0.23 IU/mL — ABNORMAL LOW (ref 0.30–0.70)

## 2017-05-05 MED ORDER — FREE WATER
100.0000 mL | Freq: Three times a day (TID) | Status: DC
Start: 2017-05-05 — End: 2017-05-09
  Administered 2017-05-05 – 2017-05-08 (×9): 100 mL

## 2017-05-05 MED ORDER — DEXTROSE-NACL 5-0.45 % IV SOLN
INTRAVENOUS | Status: DC
  Administered 2017-05-05 – 2017-05-08 (×3): via INTRAVENOUS

## 2017-05-05 MED ORDER — INSULIN ASPART 100 UNIT/ML ~~LOC~~ SOLN
0.0000 [IU] | SUBCUTANEOUS | Status: DC
  Administered 2017-05-05: 5 [IU] via SUBCUTANEOUS
  Administered 2017-05-05 – 2017-05-06 (×4): 3 [IU] via SUBCUTANEOUS
  Administered 2017-05-06: 2 [IU] via SUBCUTANEOUS
  Administered 2017-05-06 (×2): 3 [IU] via SUBCUTANEOUS
  Administered 2017-05-07: 2 [IU] via SUBCUTANEOUS
  Administered 2017-05-07: 5 [IU] via SUBCUTANEOUS
  Administered 2017-05-07 (×2): 3 [IU] via SUBCUTANEOUS
  Administered 2017-05-07: 5 [IU] via SUBCUTANEOUS
  Administered 2017-05-07 – 2017-05-08 (×4): 3 [IU] via SUBCUTANEOUS
  Administered 2017-05-08: 5 [IU] via SUBCUTANEOUS
  Administered 2017-05-08: 3 [IU] via SUBCUTANEOUS
  Administered 2017-05-09: 5 [IU] via SUBCUTANEOUS
  Administered 2017-05-09 (×2): 2 [IU] via SUBCUTANEOUS
  Administered 2017-05-09: 3 [IU] via SUBCUTANEOUS
  Administered 2017-05-09: 1 [IU] via SUBCUTANEOUS
  Administered 2017-05-09: 3 [IU] via SUBCUTANEOUS
  Administered 2017-05-10: 7 [IU] via SUBCUTANEOUS
  Administered 2017-05-10: 2 [IU] via SUBCUTANEOUS
  Administered 2017-05-10 (×3): 3 [IU] via SUBCUTANEOUS
  Administered 2017-05-10: 5 [IU] via SUBCUTANEOUS
  Administered 2017-05-11: 9 [IU] via SUBCUTANEOUS
  Administered 2017-05-11 (×2): 7 [IU] via SUBCUTANEOUS

## 2017-05-05 MED ORDER — IOPAMIDOL (ISOVUE-300) INJECTION 61%
INTRAVENOUS | Status: AC
  Filled 2017-05-05: qty 30

## 2017-05-05 NOTE — Progress Notes (Signed)
Physical Therapy Treatment Patient Details Name: Richard Bennett MRN: [id number] DOB: 24-Dec-1950 Today's Date: 05/05/2017    History of Present Illness Pt is a 67 y/o male admitted secondary to seizures, UDS + for cocaine and benzos. Pt also with acute on chronic CVA, felt to be watershed in nature. Pt required intubation with vent support from 2/8 to 2/22. Pt also required CRRT for a brief period. PMH including but not limited to CVA, DM and HTN.    PT Comments    Pt remains dependent with all mobility due to severe deficits in cognition and hemiplegia. Continue to feel pt will need long term care.   Follow Up Recommendations  SNF(will need long term)     Equipment Recommendations  Other (comment)(Provided by SNF)    Recommendations for Other Services       Precautions / Restrictions Precautions Precautions: Fall Precaution Comments: PEG Restrictions Weight Bearing Restrictions: No    Mobility  Bed Mobility Overal bed mobility: Needs Assistance             General bed mobility comments: Placed bed in chair position and attempted to bring trunk forward from bed but pt grimacing with attempts.  Transfers                    Ambulation/Gait                 Stairs            Wheelchair Mobility    Modified Rankin (Stroke Patients Only)       Balance                                            Cognition Arousal/Alertness: Awake/alert Behavior During Therapy: Flat affect Overall Cognitive Status: Difficult to assess Area of Impairment: Following commands                       Following Commands: (Does not follow any commands)       General Comments: Pt with lt gaze preference. Initially not turning head or eyes past midline to rt with clapping at rt ear. At end of session pt turning head/eyes to rt and lt when name called.      Exercises      General Comments        Pertinent Vitals/Pain Pain  Assessment: Faces Faces Pain Scale: Hurts little more Pain Location: Unsure. Grimaced with attempts to bring trunk forward. ?back vs abdomen Pain Descriptors / Indicators: Grimacing Pain Intervention(s): Limited activity within patient's tolerance;Monitored during session    Home Living                      Prior Function            PT Goals (current goals can now be found in the care plan section) Progress towards PT goals: Not progressing toward goals - comment    Frequency    Min 1X/week      PT Plan Current plan remains appropriate    Co-evaluation              AM-PAC PT "6 Clicks" Daily Activity  Outcome Measure  Difficulty turning over in bed (including adjusting bedclothes, sheets and blankets)?: Unable Difficulty moving from lying on back to sitting on the side of the bed? : Unable  Difficulty sitting down on and standing up from a chair with arms (e.g., wheelchair, bedside commode, etc,.)?: Unable Help needed moving to and from a bed to chair (including a wheelchair)?: Total Help needed walking in hospital room?: Total Help needed climbing 3-5 steps with a railing? : Total 6 Click Score: 6    End of Session   Activity Tolerance: Other (comment)(Limited by cognition) Patient left: in bed;with call bell/phone within reach;with bed alarm set   PT Visit Diagnosis: Other abnormalities of gait and mobility (R26.89);Other symptoms and signs involving the nervous system (R29.898);Hemiplegia and hemiparesis Hemiplegia - Right/Left: Right Hemiplegia - caused by: Cerebral infarction     Time: 1030-1314 PT Time Calculation (min) (ACUTE ONLY): 10 min  Charges:  $Therapeutic Activity: 8-22 mins                    G Codes:       [facility] PT [place] 05/05/2017, 12:30 PM

## 2017-05-05 NOTE — Progress Notes (Signed)
[place] for IV Heparin Indication: new DVT, also w/ recent stroke  No Known Allergies  Patient Measurements: Height: 5\' 8"  (172.7 cm) Weight: 160 lb 7.9 oz (72.8 kg) IBW/kg (Calculated) : 68.4 Heparin Dosing Weight: 73.4  Vital Signs: Temp: 99.4 F (37.4 C) (03/04 0411) Temp Source: Oral (03/04 0411) BP: 162/83 (03/04 0411) Pulse Rate: 76 (03/04 0411)  Labs: Recent Labs    05/02/17 1012 05/03/17 2014 05/04/17 0253 05/05/17 0241  HGB 8.1*  --  7.8* 8.5*  HCT 26.3*  --  25.1* 26.9*  PLT 302  --  248 256  HEPARINUNFRC  --  0.50 0.50 0.17*  CREATININE 1.99*  --  2.01*  --     Estimated Creatinine Clearance: 35 mL/min (A) (by C-G formula based on SCr of 2.01 mg/dL (H)).   Medical History: Past Medical History:  Diagnosis Date  . Chest pain   . Colon polyps    adenomatous  . Hyperlipidemia   . Hypertension   . Refusal of blood transfusions as patient is Jehovah's Witness   . Stroke ([place])   . Type II diabetes mellitus (HCC)     Medications:  Infusions:  . feeding supplement (JEVITY 1.5 CAL/FIBER) 1,000 mL (05/03/17 1700)  . heparin 700 Units/hr (05/05/17 0400)    Assessment: 67 yo male admitted with bilateral watershed infarcts, and found with DVT in R basilic vein. Pharmacy asked to begin anticoagulation with IV heparin.  Planning to transition to Eliquis soon.   Heparin level sub-therapeutic this AM after rate decrease  Goal of Therapy:  Heparin level 0.3-0.5 given recent stroke Monitor platelets by anticoagulation protocol: Yes   Plan:  Inc heparin back to 750 units/hr 1300 HL  Narda Bonds, PharmD, BCPS Clinical Pharmacist Phone: [phone]

## 2017-05-05 NOTE — Progress Notes (Signed)
Dr. Thereasa Solo paged concerning PEG tube and rising temp.

## 2017-05-05 NOTE — Progress Notes (Signed)
[place] for IV Heparin Indication: new DVT, also w/ recent stroke  No Known Allergies  Patient Measurements: Height: 5\' 8"  (172.7 cm) Weight: 160 lb 7.9 oz (72.8 kg) IBW/kg (Calculated) : 68.4 Heparin Dosing Weight: 73.4  Vital Signs: Temp: 99.4 F (37.4 C) (03/04 1208) Temp Source: Axillary (03/04 1208) BP: 151/79 (03/04 1208) Pulse Rate: 72 (03/04 1208)  Labs: Recent Labs    05/04/17 0253 05/05/17 0241 05/05/17 1224  HGB 7.8* 8.5*  --   HCT 25.1* 26.9*  --   PLT 248 256  --   HEPARINUNFRC 0.50 0.17* 0.30  CREATININE 2.01*  --   --     Estimated Creatinine Clearance: 35 mL/min (A) (by C-G formula based on SCr of 2.01 mg/dL (H)).   Medical History: Past Medical History:  Diagnosis Date  . Chest pain   . Colon polyps    adenomatous  . Hyperlipidemia   . Hypertension   . Refusal of blood transfusions as patient is Jehovah's Witness   . Stroke ([place])   . Type II diabetes mellitus (HCC)     Medications:  Infusions:  . heparin 750 Units/hr (05/05/17 0600)    Assessment: 67 yo male admitted with bilateral watershed infarcts, and found with DVT in R basilic vein. Pharmacy asked to begin anticoagulation with IV heparin.  Planning to transition to Eliquis soon.   Heparin level therapeutic around rate increase (0.3). Levels had been on higher end of goal (0.5) previously on 750 units/hr. Hgb is stable at 8.5, plts WNL. No signs/symptoms of bleeding outside of slight blood at PEG tube area and coughing up blood tinted sputum.   Goal of Therapy:  Heparin level 0.3-0.5 given recent stroke Monitor platelets by anticoagulation protocol: Yes   Plan:  Continue heparin at 750 units/hr Will order confirmatory level given levels on rate previously Monitor heparin level and CBC Monitor for worsening signs/symptoms of bleeding  Doylene Canard, PharmD Clinical Pharmacist  Pager: [phone] Clinical Phone for 05/05/2017 until 3:30pm:  [phone] If after 3:30pm, please call main pharmacy at [phone]

## 2017-05-05 NOTE — Progress Notes (Signed)
Night nurse informed bloody yellow color discharge from around PEG tube area. Stopped TF at 0630.around abdomen also hard when push. Temp 101.1 in the morning. Dr. Thereasa Solo notified this matter and administered tylenol. Plan for CT of abdomen in the morning. Administered CT PO contrast via PEG at 0915. Small amount of dark red blood came from PEG tube. Dr. Thereasa Solo made aware of it. Heprin infusing at 750 due to DVT. HS [employer]

## 2017-05-05 NOTE — Clinical Social Work Note (Signed)
Received call back from [facility] worker. Provided phone number for [facility] admissions coordinator. She will call to provide needed Medicaid pending information. She did not receive FL2 last week. CSW faxed again.  Dayton Scrape, [place]

## 2017-05-05 NOTE — Progress Notes (Signed)
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 EMS was called and the patient was given Versed.   Significant Events: 2/08Admit withacute encephalopathy, seizures,hyperglycemia, and HTN emergency - intubated 2/09 Started on Dilantin per Neurology  2/10Sedated - right hemiparesis > MRI - foley placed by Urology (urethral stricture) 2/14Hemodialysis 2/15 Tolerating CPAP/PS but not following commands 2/20Had to be placed back on vent support, still not following commands 2/22 Extubated   Subjective: Pt developed evidence of abdom pain last night/early this morning, w/ apparent tube feed and some blood coming out around PEG insertion site.  His abdom was felt to be firm in the area of the PEG insertion.  Tube feds were stopped.  At the time of my exam he does not appear to be in any acute pain or signif distress.      Assessment & Plan:  Leaking around PEG tube  May be simple malposition of bumper, but need to r/o more serious situation w/ CT to look for abscess/tracking of feeds into abdom wall, etc. 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 - R side flaccid  likely secondary to bilateral carotid stenosis in the setting of episodic hypotension with hypertensive emergency secondary to cocaine use - has stabilized - not expected to improve significantly beyond his present state - needs lifelong SNF placement   B carotid stenosis  carotid doppler this admit noted left CCA > 50%, left ICA 60-79%, and right ICA 40-59% stenosis - previously noted as early as 07/2106 on MRI  HTN BP remains elevated above goal at times, but  must exercise care to avoid overcorrection in this setting - follow w/o med changes for now - goal is 140-160 in setting of B carotid stenosis and watershed infarcts   R basilic vein non-occlusive DVT Noted on venous duplex 05/02/17 - I find no evidence of a PICC in the R UE during this hospital stay - anticoagulate w/ heparin, and if no bleeding will transition to elquis when otherwise appropriate  Dysphagia SLP following - remains NPO - now s/p PEG    Acute hypoxic respiratory failure in setting of seizure activity  Resolved - sats 100% on RA    Acute encephalopathy Remains noncommunicative - suspect he suffered significant brain injury due to his seizure and strokes - minimized all sedating meds in a stepwise fashion - do not expect signif improvement beyond his present state   Acute kidney failure  Due to above - BUN much improved w/o signif change in mental status - crt stable   Recent Labs  Lab 04/29/17 0242 04/30/17 0925 05/01/17 0304 05/02/17 1012 05/04/17 0253  CREATININE 2.34* 2.43* 2.20* 1.99* 2.01*    Hypernatremia Due to [facility] - corrected w/ free water via PEG   Seizures Appear controlled for now   Cocaine abuse  UDS positive for cocaine and benzos (given by EMS) at time of admit   Anemia No signs of bleeding - most c/w ACD  DM2 A1c 11.8 04/17/17 - CBG trending upward - make no changes in tx while tube feeds on hold  DVT prophylaxis: SQ heparin  Code Status:  FULL CODE Family Communication: no family present at time of exam  Disposition Plan: SDU   Consultants:  Neurology  PCCM  Antimicrobials:  None    Objective: Blood pressure (!) 173/84, pulse 81, temperature 100.1 F (37.8 C), temperature source Axillary, resp. rate 16, height 5\' 8"  (1.727 m), weight 72.8 kg (160 lb 7.9 oz), SpO2 98 %.  Intake/Output Summary (Last 24 hours) at 05/05/2017 1104 Last data filed at 05/05/2017 0800 Gross per 24 hour  Intake 1862.24 ml  Output 2380 ml  Net -517.76  ml   Filed Weights   05/02/17 0330 05/03/17 0340 05/04/17 0615  Weight: 72.5 kg (159 lb 13.3 oz) 73.4 kg (161 lb 13.1 oz) 72.8 kg (160 lb 7.9 oz)    Examination: General: No acute respiratory distress - opens eyes - will not follow commands for me  Lungs: CTA B - no wheeze or crackles  Cardiovascular: RRR w/o M  Abdomen: non-distended - thin - some mild induration at PEG insertion site but no large scale abnormality of abdom wall appreciable - no rebound - does not appear particularly painful to exam for me (no grimace w/ deep pressure around tube - no response at all) Extremities: No signif edema B LE   CBC: Recent Labs  Lab 04/29/17 0242 05/01/17 0304 05/02/17 1012 05/04/17 0253 05/05/17 0241  WBC 6.5 7.0 8.2 8.8 11.8*  HGB 8.4* 8.2* 8.1* 7.8* 8.5*  HCT 27.1* 26.1* 26.3* 25.1* 26.9*  MCV 87.7 87.3 86.5 85.4 85.1  PLT 294 292 302 248 503   Basic Metabolic Panel: Recent Labs  Lab 04/29/17 0242 04/30/17 0925 05/01/17 0304 05/02/17 1012 05/04/17 0253  NA 150* 152* 149* 145 144  K 3.7 4.5 4.4 4.4 4.3  CL 117* 122* 120* 113* 113*  CO2 22 21* 18* 21* 21*  GLUCOSE 137* 200* 228* 152* 172*  BUN 90* 71* 62* 55* 61*  CREATININE 2.34* 2.43* 2.20* 1.99* 2.01*  CALCIUM 8.6* 8.5* 8.5* 8.7* 8.3*  MG  --   --   --  2.3  --    GFR: Estimated Creatinine Clearance: 35 mL/min (A) (by C-G formula based on SCr of 2.01 mg/dL (H)).  Liver Function Tests: Recent Labs  Lab 04/30/17 0925  AST 46*  ALT 32  ALKPHOS 214*  BILITOT 0.5  PROT 6.2*  ALBUMIN 2.1*    Coagulation Profile: No results for input(s): INR, PROTIME in the last 168 hours.  HbA1C: Hgb A1c MFr Bld  Date/Time Value Ref Range Status  04/17/2017 08:30 AM 11.8 (H) 4.8 - 5.6 % Final    Comment:    (NOTE) Pre diabetes:          5.7%-6.4% Diabetes:              >6.4% Glycemic control for   <7.0% adults with diabetes   07/25/2016 04:38 AM 11.9 (H) 4.8 - 5.6 % Final    Comment:    (NOTE)         Pre-diabetes:  5.7 - 6.4         Diabetes: >6.4         Glycemic control for adults with diabetes: <7.0     CBG: Recent Labs  Lab 05/04/17 1619 05/04/17 1944 05/04/17 2307 05/05/17 0403 05/05/17 0739  GLUCAP 183* 166* 231* 200* 174*     Scheduled Meds: . amLODipine  10 mg Per Tube Daily  . aspirin  325 mg Per Tube Daily  . atorvastatin  40 mg Per Tube q1800  .  chlorhexidine  15 mL Mouth/Throat BID  . cloNIDine  0.3 mg Per Tube BID  . folic acid  1 mg Per Tube Daily  . free water  100 mL Per Tube Q8H  . insulin aspart  0-20 Units Subcutaneous Q4H  . iopamidol      . labetalol  200 mg Per Tube Q8H  . levETIRAcetam  500 mg Per Tube BID  . neomycin-bacitracin-polymyxin  1 application Topical Daily  . thiamine  100 mg Per Tube Daily     LOS: 24 days   [name], MD [facility] Office  [phone] Pager - Text Page per Amion as per below:  On-Call/Text Page:      [email or web address]      password [email or web address]  If 7PM-7AM, please contact night-coverage [email or web address] Password [email or web address] 05/05/2017, 11:04 AM

## 2017-05-05 NOTE — Progress Notes (Signed)
ANTICOAGULATION CONSULT NOTE  Pharmacy Consult for IV Heparin Indication: new DVT, also w/ recent stroke  No Known Allergies  Patient Measurements: Height: 5\' 8"  (172.7 cm) Weight: 160 lb 7.9 oz (72.8 kg) IBW/kg (Calculated) : 68.4 Heparin Dosing Weight: 73.4  Vital Signs: Temp: 98.7 F (37.1 C) (03/04 2017) Temp Source: Oral (03/04 2017) BP: 164/82 (03/04 2017) Pulse Rate: 81 (03/04 2017)  Labs: Recent Labs    05/04/17 0253 05/05/17 0241 05/05/17 1224 05/05/17 2043  HGB 7.8* 8.5*  --   --   HCT 25.1* 26.9*  --   --   PLT 248 256  --   --   HEPARINUNFRC 0.50 0.17* 0.30 0.23*  CREATININE 2.01*  --   --   --     Estimated Creatinine Clearance: 35 mL/min (A) (by C-G formula based on SCr of 2.01 mg/dL (H)).   Assessment: 67 yo male admitted with bilateral watershed infarcts, and found with DVT in R basilic vein. Pharmacy asked to begin anticoagulation with IV heparin. Planning to transition to Eliquis soon.   Heparin level sub-therapeutic this evening at 0.23 units/mL- level has been varying. Minor bleeding around PEG tube area, but has not worsened and no new bleeding. No issues with infusion per RN.  Goal of Therapy:  Heparin level 0.3-0.5 given recent stroke Monitor platelets by anticoagulation protocol: Yes   Plan:  Increase heparin to 800 units/hr Daily heparin level and CBC Monitor for worsening signs/symptoms of bleeding  Larosa Rhines D. [place], PharmD, [place] Clinical Pharmacist [phone] 05/05/2017 9:56 PM

## 2017-05-05 NOTE — Progress Notes (Addendum)
While cleaning Peg site, patient grimicing and tube feed/ bloody mixture comes from under Peg site when pressing on the abdomen, abdomen very hard to touch.  Patient grimicing and moaning with Site care.  Dr. On call paged.  Tube feeds turned off at this time, bowel sounds +

## 2017-05-05 NOTE — Plan of Care (Signed)
Patient unable to communicate needs, will follow simple commands, able to say his name and place tonight.  Attempting to verbalize however unable to express what he is trying to say.  Patient had several large loose stools after suppository given.  Continue to turn and provide skin care.

## 2017-05-06 ENCOUNTER — Inpatient Hospital Stay (HOSPITAL_COMMUNITY): Payer: Medicare HMO

## 2017-05-06 LAB — COMPREHENSIVE METABOLIC PANEL
ALT: 25 U/L (ref 17–63)
AST: 35 U/L (ref 15–41)
Albumin: 1.9 g/dL — ABNORMAL LOW (ref 3.5–5.0)
Alkaline Phosphatase: 205 U/L — ABNORMAL HIGH (ref 38–126)
Anion gap: 10 (ref 5–15)
BUN: 56 mg/dL — AB (ref 6–20)
CHLORIDE: 110 mmol/L (ref 101–111)
CO2: 20 mmol/L — AB (ref 22–32)
CREATININE: 2.31 mg/dL — AB (ref 0.61–1.24)
Calcium: 8.2 mg/dL — ABNORMAL LOW (ref 8.9–10.3)
GFR calc Af Amer: 32 mL/min — ABNORMAL LOW (ref >60)
GFR, EST NON AFRICAN AMERICAN: 28 mL/min — AB (ref >60)
GLUCOSE: 232 mg/dL — AB (ref 65–99)
Potassium: 5 mmol/L (ref 3.5–5.1)
Sodium: 140 mmol/L (ref 135–145)
Total Bilirubin: 0.5 mg/dL (ref 0.3–1.2)
Total Protein: 6 g/dL — ABNORMAL LOW (ref 6.5–8.1)

## 2017-05-06 LAB — CBC
HCT: 24.8 % — ABNORMAL LOW (ref 39.0–52.0)
HEMOGLOBIN: 7.7 g/dL — AB (ref 13.0–17.0)
MCH: 26.3 pg (ref 26.0–34.0)
MCHC: 31 g/dL (ref 30.0–36.0)
MCV: 84.6 fL (ref 78.0–100.0)
Platelets: 216 10*3/uL (ref 150–400)
RBC: 2.93 MIL/uL — AB (ref 4.22–5.81)
RDW: 13.5 % (ref 11.5–15.5)
WBC: 15 10*3/uL — ABNORMAL HIGH (ref 4.0–10.5)

## 2017-05-06 LAB — GLUCOSE, CAPILLARY
GLUCOSE-CAPILLARY: 225 mg/dL — AB (ref 65–99)
GLUCOSE-CAPILLARY: 213 mg/dL — AB (ref 65–99)
Glucose-Capillary: 207 mg/dL — ABNORMAL HIGH (ref 65–99)
Glucose-Capillary: 208 mg/dL — ABNORMAL HIGH (ref 65–99)
Glucose-Capillary: 184 mg/dL — ABNORMAL HIGH (ref 65–99)

## 2017-05-06 LAB — HEPARIN LEVEL (UNFRACTIONATED): Heparin Unfractionated: 0.11 IU/mL — ABNORMAL LOW (ref 0.30–0.70)

## 2017-05-06 MED ORDER — HEPARIN (PORCINE) IN NACL 100-0.45 UNIT/ML-% IJ SOLN
900.0000 [IU]/h | INTRAMUSCULAR | Status: DC
  Administered 2017-05-06: 900 [IU]/h via INTRAVENOUS

## 2017-05-06 MED ORDER — PIPERACILLIN-TAZOBACTAM 3.375 G IVPB 30 MIN
3.3750 g | Freq: Three times a day (TID) | INTRAVENOUS | Status: AC
  Administered 2017-05-06 – 2017-05-12 (×18): 3.375 g via INTRAVENOUS
  Filled 2017-05-06 (×19): qty 50

## 2017-05-06 MED ORDER — POLYETHYLENE GLYCOL 3350 17 G PO PACK
17.0000 g | PACK | Freq: Every day | ORAL | Status: DC | PRN
  Administered 2017-05-07: 17 g
  Filled 2017-05-06: qty 1

## 2017-05-06 NOTE — Progress Notes (Signed)
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 EMS was called and the patient was given Versed.   Significant Events: 2/08Admit withacute encephalopathy, seizures,hyperglycemia, and HTN emergency - intubated 2/09 Started on Dilantin per Neurology  2/10Sedated - right hemiparesis > MRI - foley placed by Urology (urethral stricture) 2/14Hemodialysis 2/15 Tolerating CPAP/PS but not following commands 2/20Had to be placed back on vent support, still not following commands 2/22 Extubated   Subjective: Pt developed evidence of abdom pain 3/3 night > 3/4.  CT abdom did not reveal any acute findings, but today his abdom is clearly sore to palpation, particularly superior to the tube insertion site.  There is no appreciable d/c around the tube at the time of my exam.  Brown/bile colored fluid is noted on suctioning through the PEG tube.      Assessment & Plan:  Abdom pain / PEG insertion site pain  No more leaking w/ stopping of TF - CT abdom w/o contrast has not revealed any signif findings in the area of his pain/the PEG - cont to hold TF for now - follow serial exam  FUO - Leukocytosis Only suspicious finding on exam is the PEG insertion tenderness - check blood cx - initiate broad spectrum abx to cover for potential peritonitis or abdom wall cellulitis once cultures are obtained   Anemia No sign of signif bleeding at present, but Hgb is slowly trending down in a pt who will not allow blood transfusions - stop anticoag and follow in serial fashion   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 - R side flaccid  likely secondary to bilateral  carotid stenosis in the setting of episodic hypotension with hypertensive emergency secondary to cocaine use - has stabilized - not expected to improve significantly beyond his present state - needs lifelong SNF placement   B carotid stenosis  carotid doppler this admit noted left CCA > 50%, left ICA 60-79%, and right ICA 40-59% stenosis - previously noted as early as 07/2106 on MRI  HTN BP elevated above goal at times, but must exercise care to avoid overcorrection in this setting - follow w/o med changes for now as low points are below goal - goal is 140-160 in setting of B carotid stenosis and watershed infarcts   R basilic vein non-occlusive DVT Noted on venous duplex 05/02/17 - I find no evidence of a PICC in the R UE during this hospital stay - have been anticoagulating w/ heparin, but w/ Hgb dropping in patient who will not allow transfusion we must stop anticoag for now and follow   Dysphagia SLP following - remains NPO - now s/p PEG but no feeding for now due to above   Acute hypoxic respiratory failure in setting of seizure activity  Resolved - sats 100% on RA    Acute encephalopathy Remains noncommunicative - suspect he suffered significant brain injury due to his seizure and strokes - minimized all sedating meds in a stepwise fashion - do not expect signif improvement beyond his present state   Acute kidney failure  Due to above - crt stable at this time   Recent Labs  Lab 04/30/17 0925 05/01/17  0304 05/02/17 1012 05/04/17 0253 05/06/17 0737  CREATININE 2.43* 2.20* 1.99* 2.01* 2.31*    Hypernatremia Due to [facility] - corrected w/ free water   Seizures controlled for now   Cocaine abuse  UDS positive for cocaine and benzos (given by EMS) at time of admit   DM2 A1c 11.8 04/17/17 - CBG trending upward w/ dextrose in IV - slow IV rate and follow   DVT prophylaxis: SCDs Code Status: FULL CODE Family Communication: no family present at time of exam  Disposition Plan:  SDU   Consultants:  Neurology  PCCM  Antimicrobials:  None    Objective: Blood pressure (!) 166/82, pulse 77, temperature 99.7 F (37.6 C), temperature source Oral, resp. rate 19, height 5\' 8"  (1.727 m), weight 72.8 kg (160 lb 7.9 oz), SpO2 98 %.  Intake/Output Summary (Last 24 hours) at 05/06/2017 1131 Last data filed at 05/06/2017 0905 Gross per 24 hour  Intake 1318.5 ml  Output 1525 ml  Net -206.5 ml   Filed Weights   05/02/17 0330 05/03/17 0340 05/04/17 0615  Weight: 72.5 kg (159 lb 13.3 oz) 73.4 kg (161 lb 13.1 oz) 72.8 kg (160 lb 7.9 oz)    Examination: General: No acute respiratory distress - opens eyes Lungs: CTA B w/o wheeze or crackles  Cardiovascular: RRR w/o M  Abdomen: non-distended - thin - no clear abnormality on gross inspection - today he clearly grimaces w/ attempts to palpate the area superior to the PEG insertion, and moves his hand to cover the area - no d/c around PEG - no signif induration or d/c - no calor  Extremities: No signif edema B LE   CBC: Recent Labs  Lab 05/01/17 0304 05/02/17 1012 05/04/17 0253 05/05/17 0241 05/06/17 0349  WBC 7.0 8.2 8.8 11.8* 15.0*  HGB 8.2* 8.1* 7.8* 8.5* 7.7*  HCT 26.1* 26.3* 25.1* 26.9* 24.8*  MCV 87.3 86.5 85.4 85.1 84.6  PLT 292 302 248 256 937   Basic Metabolic Panel: Recent Labs  Lab 04/30/17 0925 05/01/17 0304 05/02/17 1012 05/04/17 0253 05/06/17 0737  NA 152* 149* 145 144 140  K 4.5 4.4 4.4 4.3 5.0  CL 122* 120* 113* 113* 110  CO2 21* 18* 21* 21* 20*  GLUCOSE 200* 228* 152* 172* 232*  BUN 71* 62* 55* 61* 56*  CREATININE 2.43* 2.20* 1.99* 2.01* 2.31*  CALCIUM 8.5* 8.5* 8.7* 8.3* 8.2*  MG  --   --  2.3  --   --    GFR: Estimated Creatinine Clearance: 30.4 mL/min (A) (by C-G formula based on SCr of 2.31 mg/dL (H)).  Liver Function Tests: Recent Labs  Lab 04/30/17 0925 05/06/17 0737  AST 46* 35  ALT 32 25  ALKPHOS 214* 205*  BILITOT 0.5 0.5  PROT 6.2* 6.0*  ALBUMIN 2.1* 1.9*     HbA1C: Hgb A1c MFr Bld  Date/Time Value Ref Range Status  04/17/2017 08:30 AM 11.8 (H) 4.8 - 5.6 % Final    Comment:    (NOTE) Pre diabetes:          5.7%-6.4% Diabetes:              >6.4% Glycemic control for   <7.0% adults with diabetes   07/25/2016 04:38 AM 11.9 (H) 4.8 - 5.6 % Final    Comment:    (NOTE)         Pre-diabetes: 5.7 - 6.4         Diabetes: >6.4  Glycemic control for adults with diabetes: <7.0     CBG: Recent Labs  Lab 05/05/17 1750 05/05/17 2018 05/05/17 2320 05/06/17 0332 05/06/17 0808  GLUCAP 278* 219* 238* 225* 213*     Scheduled Meds: . amLODipine  10 mg Per Tube Daily  . aspirin  325 mg Per Tube Daily  . atorvastatin  40 mg Per Tube q1800  . chlorhexidine  15 mL Mouth/Throat BID  . cloNIDine  0.3 mg Per Tube BID  . folic acid  1 mg Per Tube Daily  . free water  100 mL Per Tube Q8H  . insulin aspart  0-9 Units Subcutaneous Q4H  . labetalol  200 mg Per Tube Q8H  . levETIRAcetam  500 mg Per Tube BID  . thiamine  100 mg Per Tube Daily     LOS: 25 days   [name], MD [facility] Office  [phone] Pager - Text Page per Shea Evans as per below:  On-Call/Text Page:      [email or web address]      password [email or web address]  If 7PM-7AM, please contact night-coverage [email or web address] Password [facility] 05/06/2017, 11:31 AM

## 2017-05-06 NOTE — Progress Notes (Signed)
Nutrition Follow-up  DOCUMENTATION CODES:   Not applicable  INTERVENTION:   -Once medically appropriate, recommend:  Initiate Osmolite 1.5 @ 20 ml/hr via PEG and increase by 10 ml every 4 hours to goal rate of 50 ml/hr.   30 ml Prostat BID.    Tube feeding regimen provides 2000 kcal (100% of needs), 105 grams of protein, and 914 ml of H2O.   -Also recommend 15 ml liquid MVI daily  NUTRITION DIAGNOSIS:   Inadequate oral intake related to inability to eat as evidenced by NPO status.  Ongoing  GOAL:   Patient will meet greater than or equal to 90% of their needs  Unmet  MONITOR:   TF tolerance, Diet advancement, Labs, I & O's  REASON FOR ASSESSMENT:   Ventilator, Consult(verbal) Enteral/tube feeding initiation and management  ASSESSMENT:   67 yo male with PMH of HTN, HLD, Jehovah's witness, DM-2, stroke who was admitted on 2/8 with seizures, HTN emergency, nonketotic hyperglycemia. Required intubation on admission.  2/23- s/p BSE, recommended continued NPO status, transferred from ICU to SDU 2/26- PEG placed 2/27- NGT removed 3/1- s/p MBSS, recommend continue NPO status; rt basilic vein non-occlusive DVT found 3/4- TF held due to blood coming from PEG tube site and distended abdomen  Case discussed with RN, who confirms that TF have been on hold since yesterday related to discharge from PEG site and abdominal distention (prior to this event pt had been tolerating TF well). CT scan done yesterday was unremarkable. Per RN and MD, Hgb has been dropping and heparin has been d/c. Per RN, suspicious for bleed. RN also shares that while PEG site appearance is improving, pt still distended and complains of abdominal discomfort. Continue plan to hold TF.   Labs reviewed: CBGS: 207-213 (inaptient orders for glycemic control are 0-9 units insulin aspart every 4 hours).  Pt also on D5 due to NPO status.   Diet Order:  Seizure precautions Diet NPO time specified  EDUCATION  NEEDS:   No education needs have been identified at this time  Skin:  Skin Assessment: Skin Integrity Issues: Skin Integrity Issues:: Stage II, Other (Comment) Stage II: sacrum Other: laceration to head  Last BM:  05/05/17  Height:   Ht Readings from Last 1 Encounters:  04/11/17 5\' 8"  (1.727 m)    Weight:   Wt Readings from Last 1 Encounters:  05/04/17 160 lb 7.9 oz (72.8 kg)    Ideal Body Weight:  70 kg  BMI:  Body mass index is 24.4 kg/m.  Estimated Nutritional Needs:   Kcal:  1900-2100  Protein:  105-120 grams  Fluid:  1.9-2.1 L    Shemika Robbs A. Jimmye Norman, RD, LDN, CDE Pager: [phone] After hours Pager: [phone]

## 2017-05-06 NOTE — Progress Notes (Signed)
Start for IV Heparin Indication: new DVT, also w/ recent stroke  No Known Allergies  Patient Measurements: Height: 5\' 8"  (172.7 cm) Weight: 160 lb 7.9 oz (72.8 kg) IBW/kg (Calculated) : 68.4 Heparin Dosing Weight: 73.4  Vital Signs: Temp: 100 F (37.8 C) (03/05 0348) Temp Source: Oral (03/05 0348) BP: 155/79 (03/04 2348) Pulse Rate: 71 (03/05 0348)  Labs: Recent Labs    05/04/17 0253 05/05/17 0241 05/05/17 1224 05/05/17 2043 05/06/17 0349  HGB 7.8* 8.5*  --   --  7.7*  HCT 25.1* 26.9*  --   --  24.8*  PLT 248 256  --   --  216  HEPARINUNFRC 0.50 0.17* 0.30 0.23* 0.11*  CREATININE 2.01*  --   --   --   --     Estimated Creatinine Clearance: 35 mL/min (A) (by C-G formula based on SCr of 2.01 mg/dL (H)).   Medical History: Past Medical History:  Diagnosis Date  . Chest pain   . Colon polyps    adenomatous  . Hyperlipidemia   . Hypertension   . Refusal of blood transfusions as patient is Jehovah's Witness   . Stroke ([place])   . Type II diabetes mellitus ([facility])     Medications:  Infusions:  . dextrose 5 % and 0.45% NaCl 75 mL/hr at 05/05/17 1757  . heparin 800 Units/hr (05/06/17 0347)    Assessment: 67 yo male admitted with bilateral watershed infarcts, and found with DVT in R basilic vein. Pharmacy asked to begin anticoagulation with IV heparin.  Planning to transition to Eliquis soon.   Heparin level sub-therapeutic this AM after rate decrease  Goal of Therapy:  Heparin level 0.3-0.5 given recent stroke Monitor platelets by anticoagulation protocol: Yes   Plan:  Inc heparin to 900 units/hr 1330 HL  Narda Bonds, PharmD, BCPS Clinical Pharmacist Phone: [phone]

## 2017-05-07 DIAGNOSIS — A419 Sepsis, unspecified organism: Secondary | ICD-10-CM

## 2017-05-07 DIAGNOSIS — R509 Fever, unspecified: Secondary | ICD-10-CM

## 2017-05-07 DIAGNOSIS — I82A11 Acute embolism and thrombosis of right axillary vein: Secondary | ICD-10-CM

## 2017-05-07 DIAGNOSIS — N189 Chronic kidney disease, unspecified: Secondary | ICD-10-CM

## 2017-05-07 LAB — COMPREHENSIVE METABOLIC PANEL
ALT: 26 U/L (ref 17–63)
AST: 43 U/L — ABNORMAL HIGH (ref 15–41)
Albumin: 1.8 g/dL — ABNORMAL LOW (ref 3.5–5.0)
Alkaline Phosphatase: 218 U/L — ABNORMAL HIGH (ref 38–126)
Anion gap: 12 (ref 5–15)
BILIRUBIN TOTAL: 0.8 mg/dL (ref 0.3–1.2)
BUN: 55 mg/dL — AB (ref 6–20)
CHLORIDE: 109 mmol/L (ref 101–111)
CO2: 19 mmol/L — ABNORMAL LOW (ref 22–32)
CREATININE: 2.41 mg/dL — AB (ref 0.61–1.24)
Calcium: 8.2 mg/dL — ABNORMAL LOW (ref 8.9–10.3)
GFR calc Af Amer: 31 mL/min — ABNORMAL LOW (ref >60)
GFR, EST NON AFRICAN AMERICAN: 26 mL/min — AB (ref >60)
Glucose, Bld: 178 mg/dL — ABNORMAL HIGH (ref 65–99)
Potassium: 5 mmol/L (ref 3.5–5.1)
Sodium: 140 mmol/L (ref 135–145)
Total Protein: 6.2 g/dL — ABNORMAL LOW (ref 6.5–8.1)

## 2017-05-07 LAB — GLUCOSE, CAPILLARY
GLUCOSE-CAPILLARY: 218 mg/dL — AB (ref 65–99)
GLUCOSE-CAPILLARY: 220 mg/dL — AB (ref 65–99)
GLUCOSE-CAPILLARY: 231 mg/dL — AB (ref 65–99)
Glucose-Capillary: 187 mg/dL — ABNORMAL HIGH (ref 65–99)
Glucose-Capillary: 264 mg/dL — ABNORMAL HIGH (ref 65–99)
Glucose-Capillary: 265 mg/dL — ABNORMAL HIGH (ref 65–99)
Glucose-Capillary: 282 mg/dL — ABNORMAL HIGH (ref 65–99)

## 2017-05-07 LAB — CBC
HEMATOCRIT: 25.9 % — AB (ref 39.0–52.0)
Hemoglobin: 8.1 g/dL — ABNORMAL LOW (ref 13.0–17.0)
MCH: 26.6 pg (ref 26.0–34.0)
MCHC: 31.3 g/dL (ref 30.0–36.0)
MCV: 84.9 fL (ref 78.0–100.0)
Platelets: 229 10*3/uL (ref 150–400)
RBC: 3.05 MIL/uL — AB (ref 4.22–5.81)
RDW: 13.4 % (ref 11.5–15.5)
WBC: 15.7 10*3/uL — ABNORMAL HIGH (ref 4.0–10.5)

## 2017-05-07 MED ORDER — ACETAMINOPHEN 160 MG/5ML PO SOLN
650.0000 mg | Freq: Four times a day (QID) | ORAL | Status: DC | PRN
  Administered 2017-05-07 – 2017-05-13 (×3): 650 mg
  Filled 2017-05-07 (×3): qty 20.3

## 2017-05-07 NOTE — Progress Notes (Signed)
SLP Cancellation Note  Patient Details Name: Naithen Rivenburg MRN: [id number] DOB: 10/06/1950   Cancelled treatment:        Checked with RN prior to session who reported pt has temp of 102 and obviously with decreased response and interaction than baseline. Will continue efforts.    Houston Siren 05/07/2017, 2:03 PM  Orbie Pyo Colvin Caroli.Ed [employer] [phone]

## 2017-05-07 NOTE — Progress Notes (Addendum)
PROGRESS NOTE    Richard Bennett  VOJ:[id number] DOB: 04-25-1950 DOA: 04/11/2017 PCP: [name], MD   Brief Narrative:   67 y.o. 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  Per the report patient was wandering around and is nonverbal and later found to have a generalized tonic-clonic seizure.  EMS was called and patient was given Versed.  No further history is available as family is not able to be reached.  Patient is encephalopathic.     Subjective: 3/6 MAXIMUM TEMPERATURE last 24 hours 39.1C, attempting to speak. Per RN Mliss Sax patient will state his name at times, which also painful stimuli especially 1 palpating LLQ of abdomen.      Assessment & Plan:   Principal Problem:   Status epilepticus ([place]) Active Problems:   Hypertensive emergency   Seizure ([place])   CKD (chronic kidney disease) stage 3, GFR 30-59 ml/min ([facility])   Hyperosmolar non-ketotic state in patient with type 2 diabetes mellitus ([place])   Acute respiratory failure with hypoxia ([place])   Acute encephalopathy   Cerebral thrombosis with cerebral infarction   Dysphagia   Goals of care, counseling/discussion   Palliative care encounter   Pressure injury of skin  Acute Bilateral Watershed infarct -Secondary to cocaine use -Patient unlikely to recover any meaningful function. Has been some mild improvement but remains essentially obtunded..   Acute respiratory failure with hypoxia -Resolved -Titrate O2 to maintain SPO2> 93%   Acute encephalopathy -Opens eyes, spontaneously moves LUE. Otherwise unresponsive.  Seizures -Keppra 500 mg  BID  -Seizure protocol  Essential HTN -Amlodipine 10 mg daily -Clonidine 0.3 mg BID -Labetalol 200 mg TID -Hydralazine PRN  Dysphagia -Secondary to bilateral watershed infarct.  -2/26  S/P  PEG tube placement . -3/1 failed Modified Barium Swallow study. Continue NPO  Polysubstance abuse  -On admission positive for cocaine and benzodiazepine  Anemia chronic disease -No serious signs of bleeding. Some dried blood around right nare. -Anemia panel: Most consistent with anemia of chronic disease Recent Labs  Lab 05/01/17 0304 05/02/17 1012 05/04/17 0253 05/05/17 0241 05/06/17 0349 05/07/17 0223  HGB 8.2* 8.1* 7.8* 8.5* 7.7* 8.1*  -Stable  RUE swelling/positive nonocclusive DVT -Doppler pending: RIGHT upper extremity basilic vein positive nonocclusive DVT -Unfortunately cannot anticoagulate patient secondary to continued hemoglobin drop. This in a patient who for religious beliefs will not allow transfusions.   Sepsis due to unspecified organism -Meets criteria for sepsis. WBC> 12, temp> 30C, site of infection LLQ abdominal wall. -Continue current empiric antibiotics  FUO/Leukocytosis -Continues to spike fevers, continued LLQ abdominal pain. -See sepsis   LLQ Abdominal pain/PEG insertion site pain -CT abdomen/pelvis nondiagnostic for acute cause of pain. -Although CT scan negative upon exam area warm to touch. Hard to touch: Abdominal wall cellulitis? No evidence of abscess on CT of abdomen.  -  Elevated liver enzyme      Goals of care -2/24 PALLIATIVE CARE: Consult placed.Bilateral Watershed infarct, eyes open unresponsive. Family with on realistic expectations. Address CODE STATUS should be DO NOT RESUSCITATE,     DVT prophylaxis: Subcutaneous heparin Code Status: Full Family Communication: None Disposition Plan: SNF; awaiting placement.      Consultants:  Stroke team [facility] M     Procedures/Significant Events:  2/08  Admit with acute encephalopathy, seizures, hyperglycemia, and HTN emergency 2/09  Started on Dilantin per neurology  2/10  Sedated and now with right hemiparesis for an MRI 2/14  Hemodialysis 2/15  Tolerating CPAP/PS but not  following commands 2/20  Had to be placed back on vent support, still not following commands 2/22  Extubated  2/26 PEG tube placement 3/1 Modified Barium Swallow study. Failed 3/4 CT abdomen pelvis WO contrast:Small amount of nonspecific perisplenic fluid.Otherwise, no acute abdominal or pelvic pathology. -Small bilateral pleural effusion with dependent atelectasis. -Gastrostomy tube in satisfactory position.     I have personally reviewed and interpreted all radiology studies and my findings are as above.  VENTILATOR SETTINGS: None   Cultures 2/8 blood negative 3/5 blood pending     Antimicrobials: Anti-infectives (From admission, onward)   Start     Stop   05/06/17 2200  piperacillin-tazobactam (ZOSYN) IVPB 3.375 g         04/28/17 0930  ceFAZolin (ANCEF) IVPB 2g/100 mL premix     04/29/17 1603   04/11/17 1345  Ampicillin-Sulbactam (UNASYN) 3 g in sodium chloride 0.9 % 100 mL IVPB     04/17/17 1315   04/11/17 0730  Ampicillin-Sulbactam (UNASYN) 3 g in sodium chloride 0.9 % 100 mL IVPB  Status:  Discontinued     04/11/17 1330       Devices   LINES / TUBES:  ETT 2/8 >> 2/22 OG tube 2/8 >> 2/22  R IJ HD cath 2/13 >> 2/22 Foley 2/10 (placed by urology secondary to urethral stricture) >> out  PEG tube 2/26>>    Continuous Infusions: . dextrose 5 % and 0.45% NaCl 50 mL/hr at 05/06/17 1234  . piperacillin-tazobactam 3.375 g (05/07/17 0505)     Objective: Vitals:   05/06/17 1700 05/06/17 2003 05/06/17 2312 05/07/17 0334  BP: 139/68 (!) 151/91 123/65 138/81  Pulse:  88 79 86  Resp:      Temp: 100 F (37.8 C) (!) 101.9 F (38.8 C) 98.1 F (36.7 C) 98 F (36.7 C)  TempSrc:  Oral Oral Oral  SpO2:  97% 100% 98%  Weight:      Height:        Intake/Output Summary (Last 24 hours) at 05/07/2017 0700 Last data filed at 05/07/2017 0644 Gross per 24 hour  Intake 578.7 ml  Output 1450 ml  Net -871.3 ml   Filed Weights   05/02/17 0330 05/03/17 0340 05/04/17  0615  Weight: 159 lb 13.3 oz (72.5 kg) 161 lb 13.1 oz (73.4 kg) 160 lb 7.9 oz (72.8 kg)   Physical Exam:  General: Eyes open, patient attempted to pronounce his name. No acute respiratory distress Neck:  Negative scars, masses, torticollis, lymphadenopathy, JVD Lungs: Clear to auscultation bilaterally without wheezes or crackles Cardiovascular: Regular rate and rhythm without murmur gallop or rub normal S1 and S2 Abdomen: Positive LLQ abdominal pain, warm to the touch, LLQ hard area, nondistended, hypoactive bowel sounds, no rebound, no ascites, no appreciable mass. PEG tube site covered and clean negative discharge or sign of infection. Extremities: No significant cyanosis, clubbing, or edema bilateral lower extremities Skin: Negative rashes, lesions, ulcers Psychiatric:  Unable to evaluate secondary CVA Central nervous system:  Eyes open, spontaneously moves LUE, mild withdrawal painful stimuli RLE, mostly nonverbal. Does not follow commands.   .     Data Reviewed: Care during the described time interval was provided by me .  I have reviewed this patient's available data, including medical history, events of note, physical examination, and all test results as part of my evaluation.   CBC: Recent  Labs  Lab 05/02/17 1012 05/04/17 0253 05/05/17 0241 05/06/17 0349 05/07/17 0223  WBC 8.2 8.8 11.8* 15.0* 15.7*  HGB 8.1* 7.8* 8.5* 7.7* 8.1*  HCT 26.3* 25.1* 26.9* 24.8* 25.9*  MCV 86.5 85.4 85.1 84.6 84.9  PLT 302 248 256 216 235   Basic Metabolic Panel: Recent Labs  Lab 05/01/17 0304 05/02/17 1012 05/04/17 0253 05/06/17 0737 05/07/17 0223  NA 149* 145 144 140 140  K 4.4 4.4 4.3 5.0 5.0  CL 120* 113* 113* 110 109  CO2 18* 21* 21* 20* 19*  GLUCOSE 228* 152* 172* 232* 178*  BUN 62* 55* 61* 56* 55*  CREATININE 2.20* 1.99* 2.01* 2.31* 2.41*  CALCIUM 8.5* 8.7* 8.3* 8.2* 8.2*  MG  --  2.3  --   --   --    GFR: Estimated Creatinine Clearance: 29.2 mL/min (A) (by C-G formula  based on SCr of 2.41 mg/dL (H)). Liver Function Tests: Recent Labs  Lab 04/30/17 0925 05/06/17 0737 05/07/17 0223  AST 46* 35 43*  ALT 32 25 26  ALKPHOS 214* 205* 218*  BILITOT 0.5 0.5 0.8  PROT 6.2* 6.0* 6.2*  ALBUMIN 2.1* 1.9* 1.8*   No results for input(s): LIPASE, AMYLASE in the last 168 hours. No results for input(s): AMMONIA in the last 168 hours. Coagulation Profile: No results for input(s): INR, PROTIME in the last 168 hours. Cardiac Enzymes: No results for input(s): CKTOTAL, CKMB, CKMBINDEX, TROPONINI in the last 168 hours. BNP (last 3 results) No results for input(s): PROBNP in the last 8760 hours. HbA1C: No results for input(s): HGBA1C in the last 72 hours. CBG: Recent Labs  Lab 05/06/17 1214 05/06/17 1707 05/06/17 2001 05/07/17 0053 05/07/17 0353  GLUCAP 207* 208* 184* 220* 187*   Lipid Profile: No results for input(s): CHOL, HDL, LDLCALC, TRIG, CHOLHDL, LDLDIRECT in the last 72 hours. Thyroid Function Tests: No results for input(s): TSH, T4TOTAL, FREET4, T3FREE, THYROIDAB in the last 72 hours. Anemia Panel: No results for input(s): VITAMINB12, FOLATE, FERRITIN, TIBC, IRON, RETICCTPCT in the last 72 hours. Urine analysis:    Component Value Date/Time   COLORURINE STRAW (A) 04/11/2017 0447   APPEARANCEUR CLEAR 04/11/2017 0447   LABSPEC 1.013 04/11/2017 0447   PHURINE 7.0 04/11/2017 0447   GLUCOSEU >=500 (A) 04/11/2017 0447   HGBUR SMALL (A) 04/11/2017 0447   BILIRUBINUR NEGATIVE 04/11/2017 0447   KETONESUR NEGATIVE 04/11/2017 0447   PROTEINUR 100 (A) 04/11/2017 0447   NITRITE NEGATIVE 04/11/2017 0447   LEUKOCYTESUR NEGATIVE 04/11/2017 0447   Sepsis Labs: @LABRCNTIP (procalcitonin:4,lacticidven:4)  )No results found for this or any previous visit (from the past 240 hour(s)).       Radiology Studies: Ct Abdomen Pelvis Wo Contrast  Result Date: 05/05/2017 CLINICAL DATA:  Abdominal pain, fever, abscess EXAM: CT ABDOMEN AND PELVIS WITHOUT  CONTRAST TECHNIQUE: Multidetector CT imaging of the abdomen and pelvis was performed following the standard protocol without IV contrast. COMPARISON:  03/16/2016 FINDINGS: Lower chest: Small bilateral pleural effusions with dependent atelectasis. Hepatobiliary: No focal liver abnormality is seen. No gallstones, gallbladder wall thickening, or biliary dilatation. Pancreas: Unremarkable. No pancreatic ductal dilatation or surrounding inflammatory changes. Spleen: Normal in size without focal abnormality. Small amount of nonspecific perisplenic fluid. Adrenals/Urinary Tract: Adrenal glands are unremarkable. Kidneys are normal, without renal calculi, focal lesion, or hydronephrosis. Bladder is unremarkable. Stomach/Bowel: Stomach is within normal limits. Gastrostomy tube in satisfactory position. No evidence of bowel wall thickening, distention, or inflammatory changes. Moderate amount of stool in the ascending and transverse  colon. Vascular/Lymphatic: Aortic atherosclerosis. No enlarged abdominal or pelvic lymph nodes. Reproductive: Prostate is unremarkable. Other: No abdominal wall hernia or abnormality. No abdominopelvic ascites. Musculoskeletal: No acute or significant osseous findings. Posterior lumbar interbody fusion at L4-5. IMPRESSION: 1. Small amount of nonspecific perisplenic fluid. 2. Otherwise, no acute abdominal or pelvic pathology. 3. Small bilateral pleural effusion with dependent atelectasis. 4.  Aortic Atherosclerosis (ICD10-I70.0). 5. Gastrostomy tube in satisfactory position. Electronically Signed   By: Kathreen Devoid   On: 05/05/2017 13:01   Dg Chest Port 1 View  Result Date: 05/06/2017 CLINICAL DATA:  Fever of unknown origin. EXAM: PORTABLE CHEST 1 VIEW COMPARISON:  04/25/2017 FINDINGS: Support devices on the prior study have been removed. The cardiac silhouette is enlarged. Aortic atherosclerosis is noted. The lungs are mildly hypoinflated. Patchy airspace opacities are present in both lung  bases. No sizable pleural effusion or pneumothorax is identified. No acute osseous abnormality is seen. A retained scratched at a small radiopaque foreign body projects over the mid right chest. IMPRESSION: Mild hypoinflation with patchy bibasilar opacities which may reflect pneumonia or atelectasis. Electronically Signed   By: Logan Bores M.D.   On: 05/06/2017 14:09        Scheduled Meds: . amLODipine  10 mg Per Tube Daily  . aspirin  325 mg Per Tube Daily  . atorvastatin  40 mg Per Tube q1800  . chlorhexidine  15 mL Mouth/Throat BID  . cloNIDine  0.3 mg Per Tube BID  . folic acid  1 mg Per Tube Daily  . free water  100 mL Per Tube Q8H  . insulin aspart  0-9 Units Subcutaneous Q4H  . labetalol  200 mg Per Tube Q8H  . levETIRAcetam  500 mg Per Tube BID  . thiamine  100 mg Per Tube Daily   Continuous Infusions: . dextrose 5 % and 0.45% NaCl 50 mL/hr at 05/06/17 1234  . piperacillin-tazobactam 3.375 g (05/07/17 0505)     LOS: 26 days    Time spent: 75 minutes    Shaunn Tackitt, Geraldo Docker, MD [facility] Pager [phone]   If 7PM-7AM, please contact night-coverage [email or web address] Password [email or web address] 05/07/2017, 7:00 AM

## 2017-05-07 NOTE — Clinical Social Work Note (Signed)
[facility] has received an authorization denial from [facility] for rehab. They have been able to confirm that his Medicaid application is pending. Patient can discharge to the facility when stable with 30-day LOG which was approved by medical director for RNCM/CSW dept on 2/26.  Dayton Scrape, [place]

## 2017-05-08 ENCOUNTER — Inpatient Hospital Stay (HOSPITAL_COMMUNITY): Payer: Medicare HMO

## 2017-05-08 DIAGNOSIS — R1032 Left lower quadrant pain: Secondary | ICD-10-CM

## 2017-05-08 DIAGNOSIS — N184 Chronic kidney disease, stage 4 (severe): Secondary | ICD-10-CM

## 2017-05-08 LAB — GLUCOSE, CAPILLARY
GLUCOSE-CAPILLARY: 205 mg/dL — AB (ref 65–99)
GLUCOSE-CAPILLARY: 217 mg/dL — AB (ref 65–99)
GLUCOSE-CAPILLARY: 222 mg/dL — AB (ref 65–99)
GLUCOSE-CAPILLARY: 233 mg/dL — AB (ref 65–99)
Glucose-Capillary: 222 mg/dL — ABNORMAL HIGH (ref 65–99)
Glucose-Capillary: 217 mg/dL — ABNORMAL HIGH (ref 65–99)

## 2017-05-08 LAB — CBC WITH DIFFERENTIAL/PLATELET
BASOS PCT: 0 %
Basophils Absolute: 0 10*3/uL (ref 0.0–0.1)
Eosinophils Absolute: 0 10*3/uL (ref 0.0–0.7)
Eosinophils Relative: 0 %
HEMATOCRIT: 23.3 % — AB (ref 39.0–52.0)
Hemoglobin: 7.5 g/dL — ABNORMAL LOW (ref 13.0–17.0)
LYMPHS ABS: 1.2 10*3/uL (ref 0.7–4.0)
Lymphocytes Relative: 10 %
MCH: 26.7 pg (ref 26.0–34.0)
MCHC: 32.2 g/dL (ref 30.0–36.0)
MCV: 82.9 fL (ref 78.0–100.0)
MONOS PCT: 8 %
Monocytes Absolute: 1 10*3/uL (ref 0.1–1.0)
Neutro Abs: 9.7 10*3/uL — ABNORMAL HIGH (ref 1.7–7.7)
Neutrophils Relative %: 82 %
Platelets: 203 10*3/uL (ref 150–400)
RBC: 2.81 MIL/uL — AB (ref 4.22–5.81)
RDW: 13.4 % (ref 11.5–15.5)
WBC: 11.9 10*3/uL — AB (ref 4.0–10.5)

## 2017-05-08 LAB — COMPREHENSIVE METABOLIC PANEL
ALBUMIN: 1.6 g/dL — AB (ref 3.5–5.0)
ALT: 26 U/L (ref 17–63)
AST: 47 U/L — AB (ref 15–41)
Alkaline Phosphatase: 180 U/L — ABNORMAL HIGH (ref 38–126)
Anion gap: 14 (ref 5–15)
BILIRUBIN TOTAL: 0.5 mg/dL (ref 0.3–1.2)
BUN: 62 mg/dL — AB (ref 6–20)
CALCIUM: 8 mg/dL — AB (ref 8.9–10.3)
CO2: 16 mmol/L — ABNORMAL LOW (ref 22–32)
CREATININE: 2.9 mg/dL — AB (ref 0.61–1.24)
Chloride: 110 mmol/L (ref 101–111)
GFR calc Af Amer: 24 mL/min — ABNORMAL LOW (ref >60)
GFR, EST NON AFRICAN AMERICAN: 21 mL/min — AB (ref >60)
GLUCOSE: 250 mg/dL — AB (ref 65–99)
POTASSIUM: 4.5 mmol/L (ref 3.5–5.1)
Sodium: 140 mmol/L (ref 135–145)
TOTAL PROTEIN: 5.2 g/dL — AB (ref 6.5–8.1)

## 2017-05-08 LAB — LACTIC ACID, PLASMA: Lactic Acid, Venous: 1.2 mmol/L (ref 0.5–1.9)

## 2017-05-08 LAB — MAGNESIUM: Magnesium: 2.4 mg/dL (ref 1.7–2.4)

## 2017-05-08 NOTE — Progress Notes (Signed)
Results for ANTOINE, VANDERMEULEN (MRN [id number]) as of 05/08/2017 09:36  Ref. Range 05/07/2017 17:02 05/07/2017 20:27 05/07/2017 23:51 05/08/2017 05:57 05/08/2017 07:59  Glucose-Capillary Latest Ref Range: 65 - 99 mg/dL 218 (H) 265 (H) 282 (H) 205 (H) 233 (H)  Noted that blood sugars have been greater than 180 mg/dl.  Recommend adding Lantus 15 units daily which is his home dose of insulin. Will continue to monitor blood sugars while in the hospital.  Harvel Ricks RN BSN CDE Diabetes Coordinator Pager: [phone]  8am-5pm

## 2017-05-08 NOTE — Progress Notes (Signed)
  Speech Language Pathology Treatment: Cognitive-Linquistic;Dysphagia  Patient Details Name: Richard Bennett MRN: [id number] DOB: 11/21/1950 Today's Date: 05/08/2017 Time: 1017-5102 SLP Time Calculation (min) (ACUTE ONLY): 17 min  Assessment / Plan / Recommendation Clinical Impression  Pt more interactive, able to sustain attention up to 10 seconds versus 1-2 last week. Verbally responsed to simple questions 50% of the time given additional time and repetition. Unable to problem solve month provided with max cues (visual with calendar and verbal "what comes after Feb"; able to fill in blank Jan, Feb, __). Responded "tastes good" when queried. Following oral care consumed ice chip with oral delay/holding. Pt's dysphagia severe (with expected longer term need for his PEG) and will not be appropriate for repeat MBS while here however recommend continue tx at discharge for hopeful improvements in future.   HPI HPI: 67 yo M with hx CVA, DM, HTN, presents to the ER with acute encephalopathy and seizures.  Intubated 2/8-2/22. Most recent MRI dated 04/16/2017 is showing new acute infarcts bilaterally left greater then right that appear to be watershed infarcts. Chest xray dated 04/25/2017 is showing no acute cardiopulmonary issues. Patient is known to [facility] service from previous admission with swallowing evaluation dated 07/25/2016 with recommendation for a dysphagia 2 diet with nectar thick liquids      SLP Plan  Continue with current plan of care       Recommendations  Diet recommendations: NPO Medication Administration: Via alternative means                Oral Care Recommendations: Oral care QID Follow up Recommendations: Skilled Nursing facility SLP Visit Diagnosis: Dysphagia, oral phase (R13.11) Attention and concentration deficit following: Cerebral infarction Plan: Continue with current plan of care                       Houston Siren 05/08/2017, 11:01 AM   Orbie Pyo  Colvin Caroli.[employer] [phone]

## 2017-05-08 NOTE — Progress Notes (Signed)
PROGRESS NOTE    Richard Bennett  SEG:[id number] DOB: 1950-07-29 DOA: 04/11/2017 PCP: [name], MD   Brief Narrative:   67 y.o. 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  Per the report patient was wandering around and is nonverbal and later found to have a generalized tonic-clonic seizure.  EMS was called and patient was given Versed.  No further history is available as family is not able to be reached.  Patient is encephalopathic.     Subjective: 3/7 afebrile last 24 hours. Alert, answer some questions appropriately A/O 2 (does not know when, why).      Assessment & Plan:   Principal Problem:   Status epilepticus ([place]) Active Problems:   Hypertensive emergency   Seizure ([place])   CKD (chronic kidney disease) stage 3, GFR 30-59 ml/min (HCC)   Hyperosmolar non-ketotic state in patient with type 2 diabetes mellitus ([place])   Acute respiratory failure with hypoxia ([place])   Acute encephalopathy   Cerebral thrombosis with cerebral infarction   Dysphagia   Goals of care, counseling/discussion   Palliative care encounter   Pressure injury of skin  Acute Bilateral Watershed infarct -Secondary to cocaine use -patient with some mild improvement now A/O 2.. Still does not follow commands.  Acute respiratory failure with hypoxia -Resolved -Titrate O2 to maintain SPO2> 93%   Acute encephalopathy -Opens eyes, spontaneously moves LUE. Beginning to answer some questions appropriately  Seizures -Keppra500 mg  BID  -Seizure protocol  Essential HTN -Amlodipine 10 mg daily -Clonidine 0.3 mg BID -Labetalol 200 mg TID -Hydralazine PRN  Dysphagia -Secondary to bilateral watershed infarct.  -2/26  S/P PEG tube placement . -3/1 failed Modified Barium Swallow study. Continue NPO -3/7 patient much more  alert requests repeat swallow evaluation  Polysubstance abuse  -On admission positive for cocaine and benzodiazepine  Anemia chronic disease -No serious signs of bleeding. Some dried blood around right nare. -Anemia panel: Most consistent with anemia of chronic disease Recent Labs  Lab 05/02/17 1012 05/04/17 0253 05/05/17 0241 05/06/17 0349 05/07/17 0223  HGB 8.1* 7.8* 8.5* 7.7* 8.1*  -Stable  RUE swelling/positive nonocclusive DVT -Doppler pending: RIGHT upper extremity basilic vein positive nonocclusive DVT -Unfortunately cannot anticoagulate patient secondary to continued hemoglobin drop. This in a patient who for religious beliefs will not allow transfusions.   Sepsis due to unspecified organism -Meets criteria for sepsis. WBC> 12, temp> 30C, site of infection LLQ abdominal wall. -Continue current empiric antibiotics  FUO/Leukocytosis -continue LLQ abdominal pain -Afebrile last 24 hrs -See sepsis  LLQ Abdominal pain/PEG insertion site pain -CT abdomen/pelvis nondiagnostic for acute cause of pain. -Although CT scan negative upon exam area warm to touch. Hard to touch: Abdominal wall cellulitis? No evidence of abscess on CT of abdomen.  -residuals with trickle feeds. Hold tube feeds. KUB pending  Elevated liver enzyme -improving continue to monitor     Goals of care -2/24 PALLIATIVE CARE: Consult placed.Bilateral Watershed infarct, eyes open unresponsive. Family with on realistic expectations. Address CODE STATUS should be DO NOT RESUSCITATE,     DVT prophylaxis: Subcutaneous heparin Code Status: Full Family Communication: None Disposition Plan: SNF; awaiting placement.      Consultants:  Stroke team [facility]     Procedures/Significant Events:  2/08  Admit with acute encephalopathy, seizures, hyperglycemia, and HTN emergency 2/09  Started on Dilantin  per neurology  2/10  Sedated and now with right hemiparesis for an MRI 2/14  Hemodialysis 2/15   Tolerating CPAP/PS but not following commands 2/20  Had to be placed back on vent support, still not following commands 2/22  Extubated  2/26 PEG tube placement 3/1 Modified Barium Swallow study. Failed 3/4 CT abdomen pelvis WO contrast:Small amount of nonspecific perisplenic fluid.Otherwise, no acute abdominal or pelvic pathology. -Small bilateral pleural effusion with dependent atelectasis. -Gastrostomy tube in satisfactory position.     I have personally reviewed and interpreted all radiology studies and my findings are as above.  VENTILATOR SETTINGS: None   Cultures 2/8 blood negative 3/5 blood pending     Antimicrobials: Anti-infectives (From admission, onward)   Start     Stop   05/06/17 2200  piperacillin-tazobactam (ZOSYN) IVPB 3.375 g         04/28/17 0930  ceFAZolin (ANCEF) IVPB 2g/100 mL premix     04/29/17 1603   04/11/17 1345  Ampicillin-Sulbactam (UNASYN) 3 g in sodium chloride 0.9 % 100 mL IVPB     04/17/17 1315   04/11/17 0730  Ampicillin-Sulbactam (UNASYN) 3 g in sodium chloride 0.9 % 100 mL IVPB  Status:  Discontinued     04/11/17 1330       Devices   LINES / TUBES:  ETT 2/8 >> 2/22 OG tube 2/8 >> 2/22  R IJ HD cath 2/13 >> 2/22 Foley 2/10 (placed by urology secondary to urethral stricture) >> out  PEG tube 2/26>>    Continuous Infusions: . dextrose 5 % and 0.45% NaCl 50 mL/hr at 05/07/17 1600  . piperacillin-tazobactam 3.375 g (05/07/17 2202)     Objective: Vitals:   05/07/17 1926 05/07/17 2339 05/08/17 0343 05/08/17 0719  BP: (!) 123/54  131/61   Pulse: 74     Resp:      Temp: 98.1 F (36.7 C) 98.9 F (37.2 C)  98.2 F (36.8 C)  TempSrc: Oral Axillary Axillary Oral  SpO2: 98%  100%   Weight:      Height:        Intake/Output Summary (Last 24 hours) at 05/08/2017 0811 Last data filed at 05/08/2017 0801 Gross per 24 hour  Intake 1762.5 ml  Output 1225 ml  Net 537.5 ml   Filed Weights   05/02/17 0330 05/03/17 0340 05/04/17  0615  Weight: 159 lb 13.3 oz (72.5 kg) 161 lb 13.1 oz (73.4 kg) 160 lb 7.9 oz (72.8 kg)   Physical Exam:  General: A/O 2 (does not know when, why). does not follow commands,No acute respiratory distress Neck:  Negative scars, masses, torticollis, lymphadenopathy, JVD Lungs: Clear to auscultation bilaterally without wheezes or crackles Cardiovascular: Regular rate and rhythm without murmur gallop or rub normal S1 and S2 Abdomen: Positive LLQ abdominal pain, warm to the touch, LLQ hard area, nondistended, hypoactive bowel sounds, no rebound, no ascites, no appreciable mass. PEG tube site covered and clean positive yellowish discharge, on gauze. Extremities: No significant cyanosis, clubbing, or edema bilateral lower extremities Skin: Negative rashes, lesions, ulcers Psychiatric:  Unable to evaluate secondary CVA Central nervous system:  Eyes open, spontaneously moves LUE, mild withdrawal painful stimuli RLE, mostly nonverbal. Does not follow commands.  .     Data Reviewed: Care during the described time interval was provided by me .  I have reviewed this patient's available data, including medical history, events of note, physical examination, and all test results as part of my evaluation.   CBC: Recent Labs  Lab 05/02/17 1012 05/04/17 0253 05/05/17 0241 05/06/17 0349 05/07/17 0223  WBC 8.2 8.8 11.8* 15.0* 15.7*  HGB 8.1* 7.8* 8.5* 7.7* 8.1*  HCT 26.3* 25.1* 26.9* 24.8* 25.9*  MCV 86.5 85.4 85.1 84.6 84.9  PLT 302 248 256 216 086   Basic Metabolic Panel: Recent Labs  Lab 05/02/17 1012 05/04/17 0253 05/06/17 0737 05/07/17 0223  NA 145 144 140 140  K 4.4 4.3 5.0 5.0  CL 113* 113* 110 109  CO2 21* 21* 20* 19*  GLUCOSE 152* 172* 232* 178*  BUN 55* 61* 56* 55*  CREATININE 1.99* 2.01* 2.31* 2.41*  CALCIUM 8.7* 8.3* 8.2* 8.2*  MG 2.3  --   --   --    GFR: Estimated Creatinine Clearance: 29.2 mL/min (A) (by C-G formula based on SCr of 2.41 mg/dL (H)). Liver Function  Tests: Recent Labs  Lab 05/06/17 0737 05/07/17 0223  AST 35 43*  ALT 25 26  ALKPHOS 205* 218*  BILITOT 0.5 0.8  PROT 6.0* 6.2*  ALBUMIN 1.9* 1.8*   No results for input(s): LIPASE, AMYLASE in the last 168 hours. No results for input(s): AMMONIA in the last 168 hours. Coagulation Profile: No results for input(s): INR, PROTIME in the last 168 hours. Cardiac Enzymes: No results for input(s): CKTOTAL, CKMB, CKMBINDEX, TROPONINI in the last 168 hours. BNP (last 3 results) No results for input(s): PROBNP in the last 8760 hours. HbA1C: No results for input(s): HGBA1C in the last 72 hours. CBG: Recent Labs  Lab 05/07/17 1214 05/07/17 1702 05/07/17 2027 05/07/17 2351 05/08/17 0557  GLUCAP 264* 218* 265* 282* 205*   Lipid Profile: No results for input(s): CHOL, HDL, LDLCALC, TRIG, CHOLHDL, LDLDIRECT in the last 72 hours. Thyroid Function Tests: No results for input(s): TSH, T4TOTAL, FREET4, T3FREE, THYROIDAB in the last 72 hours. Anemia Panel: No results for input(s): VITAMINB12, FOLATE, FERRITIN, TIBC, IRON, RETICCTPCT in the last 72 hours. Urine analysis:    Component Value Date/Time   COLORURINE STRAW (A) 04/11/2017 0447   APPEARANCEUR CLEAR 04/11/2017 0447   LABSPEC 1.013 04/11/2017 0447   PHURINE 7.0 04/11/2017 0447   GLUCOSEU >=500 (A) 04/11/2017 0447   HGBUR SMALL (A) 04/11/2017 0447   BILIRUBINUR NEGATIVE 04/11/2017 0447   KETONESUR NEGATIVE 04/11/2017 0447   PROTEINUR 100 (A) 04/11/2017 0447   NITRITE NEGATIVE 04/11/2017 0447   LEUKOCYTESUR NEGATIVE 04/11/2017 0447   Sepsis Labs: @LABRCNTIP (procalcitonin:4,lacticidven:4)  ) Recent Results (from the past 240 hour(s))  Culture, blood (routine x 2)     Status: None (Preliminary result)   Collection Time: 05/06/17  7:11 PM  Result Value Ref Range Status   Specimen Description BLOOD LEFT HAND  Final   Special Requests IN PEDIATRIC BOTTLE Blood Culture adequate volume  Final   Culture   Final    NO GROWTH < 24  HOURS Performed at [facility], [address], [place], [place] [address]    Report Status PENDING  Incomplete  Culture, blood (routine x 2)     Status: None (Preliminary result)   Collection Time: 05/06/17  7:12 PM  Result Value Ref Range Status   Specimen Description BLOOD LEFT HAND  Final   Special Requests IN PEDIATRIC BOTTLE Blood Culture adequate volume  Final   Culture   Final    NO GROWTH < 24 HOURS Performed at [facility], [address], [place], [place] [address]    Report Status PENDING  Incomplete  Radiology Studies: Dg Chest Port 1 View  Result Date: 05/06/2017 CLINICAL DATA:  Fever of unknown origin. EXAM: PORTABLE CHEST 1 VIEW COMPARISON:  04/25/2017 FINDINGS: Support devices on the prior study have been removed. The cardiac silhouette is enlarged. Aortic atherosclerosis is noted. The lungs are mildly hypoinflated. Patchy airspace opacities are present in both lung bases. No sizable pleural effusion or pneumothorax is identified. No acute osseous abnormality is seen. A retained scratched at a small radiopaque foreign body projects over the mid right chest. IMPRESSION: Mild hypoinflation with patchy bibasilar opacities which may reflect pneumonia or atelectasis. Electronically Signed   By: Logan Bores M.D.   On: 05/06/2017 14:09        Scheduled Meds: . amLODipine  10 mg Per Tube Daily  . aspirin  325 mg Per Tube Daily  . atorvastatin  40 mg Per Tube q1800  . chlorhexidine  15 mL Mouth/Throat BID  . cloNIDine  0.3 mg Per Tube BID  . folic acid  1 mg Per Tube Daily  . free water  100 mL Per Tube Q8H  . insulin aspart  0-9 Units Subcutaneous Q4H  . labetalol  200 mg Per Tube Q8H  . levETIRAcetam  500 mg Per Tube BID  . thiamine  100 mg Per Tube Daily   Continuous Infusions: . dextrose 5 % and 0.45% NaCl 50 mL/hr at 05/07/17 1600  . piperacillin-tazobactam 3.375 g (05/07/17 2202)     LOS: 27 days    Time spent: 40  minutes    Atonya Templer, Geraldo Docker, MD [facility] Pager [phone]   If 7PM-7AM, please contact night-coverage [email or web address] Password [email or web address] 05/08/2017, 8:11 AM

## 2017-05-08 NOTE — Progress Notes (Signed)
There is still dark blood & yellow discharge from PEG tube area, continue to stop TF. Pt answered name, birthday and place, but he answered only his name when ask different questions in the morning. Continue to move Lt. Side, but Rt. Side no movement noticed. Pt's brother & daughter in law came to visit patient. He told their names. He stated one sentence he would like to have ice. Pt's more reactive and response now. HS [employer]

## 2017-05-09 ENCOUNTER — Inpatient Hospital Stay (HOSPITAL_COMMUNITY): Payer: Medicare HMO

## 2017-05-09 LAB — CBC
HCT: 23.2 % — ABNORMAL LOW (ref 39.0–52.0)
Hemoglobin: 7.4 g/dL — ABNORMAL LOW (ref 13.0–17.0)
MCH: 25.8 pg — ABNORMAL LOW (ref 26.0–34.0)
MCHC: 31.9 g/dL (ref 30.0–36.0)
MCV: 80.8 fL (ref 78.0–100.0)
Platelets: 202 10*3/uL (ref 150–400)
RBC: 2.87 MIL/uL — AB (ref 4.22–5.81)
RDW: 13.1 % (ref 11.5–15.5)
WBC: 9.9 10*3/uL (ref 4.0–10.5)

## 2017-05-09 LAB — GLUCOSE, CAPILLARY
GLUCOSE-CAPILLARY: 139 mg/dL — AB (ref 65–99)
GLUCOSE-CAPILLARY: 198 mg/dL — AB (ref 65–99)
Glucose-Capillary: 257 mg/dL — ABNORMAL HIGH (ref 65–99)
Glucose-Capillary: 198 mg/dL — ABNORMAL HIGH (ref 65–99)
Glucose-Capillary: 224 mg/dL — ABNORMAL HIGH (ref 65–99)
Glucose-Capillary: 198 mg/dL — ABNORMAL HIGH (ref 65–99)

## 2017-05-09 MED ORDER — ALUM & MAG HYDROXIDE-SIMETH 200-200-20 MG/5ML PO SUSP: 15.0000 mL | ORAL | Status: DC | PRN

## 2017-05-09 MED ORDER — ADULT MULTIVITAMIN W/MINERALS CH
1.0000 | ORAL_TABLET | Freq: Every day | ORAL | Status: DC
  Administered 2017-05-09 – 2017-05-13 (×5): 1 via ORAL
  Filled 2017-05-09 (×5): qty 1

## 2017-05-09 MED ORDER — INSULIN GLARGINE 100 UNIT/ML ~~LOC~~ SOLN
10.0000 [IU] | Freq: Every day | SUBCUTANEOUS | Status: DC
Start: 2017-05-09 — End: 2017-05-11
  Administered 2017-05-09 – 2017-05-10 (×2): 10 [IU] via SUBCUTANEOUS
  Filled 2017-05-09 (×3): qty 0.1

## 2017-05-09 MED ORDER — FREE WATER
200.0000 mL | Freq: Three times a day (TID) | Status: DC
  Administered 2017-05-09 – 2017-05-12 (×9): 200 mL

## 2017-05-09 MED ORDER — CLONIDINE HCL 0.2 MG PO TABS
0.2000 mg | ORAL_TABLET | Freq: Two times a day (BID) | ORAL | Status: DC
  Administered 2017-05-09 – 2017-05-13 (×8): 0.2 mg
  Filled 2017-05-09 (×8): qty 1

## 2017-05-09 MED ORDER — OSMOLITE 1.5 CAL PO LIQD
1000.0000 mL | ORAL | Status: DC
  Administered 2017-05-09: 20 mL/h
  Administered 2017-05-10: 1000 mL
  Filled 2017-05-09 (×4): qty 1000

## 2017-05-09 MED ORDER — RESOURCE THICKENUP CLEAR PO POWD
ORAL | Status: DC | PRN
  Filled 2017-05-09: qty 125

## 2017-05-09 NOTE — Progress Notes (Signed)
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 EMS was called and the patient was given Versed.   Significant Events: 2/08Admit withacute encephalopathy, seizures,hyperglycemia, and HTN emergency - intubated 2/09 Started on Dilantin per Neurology  2/10Sedated - right hemiparesis > MRI - foley placed by Urology (urethral stricture) 2/14Hemodialysis 2/15 Tolerating CPAP/PS but not following commands 2/20Had to be placed back on vent support, still not following commands 2/22 Extubated   Subjective: The patient is dramatically different at the time of my visit today compared to 05/06/17 when I last saw him.  He turns and looks at me when I enter the room and responds "fine" when I asked him how he is doing.  He is able to answer some very simple questions.  He is not oriented to place or time but clearly understands my questions.  He denies chest pain shortness of breath nausea vomiting or abdominal pain.  There is no reaction whatsoever when I palpate around his PEG tube.     Assessment & Plan:  Abdom pain / PEG insertion site pain  No more leaking w/ stopping of TF - CT abdom w/o contrast did not reveal any signif findings in the area of his pain/the PEG - exam entirely benign today - resume tube feeding at low rate and slowly advance as able   FUO - Leukocytosis Only suspicious finding on exam was the PEG insertion tenderness - blood cx negative x2 - broad spectrum abx continues - now afebrile w/ improving WBC - cont abx for 7 day course - no clear sight of infection found, but is clinically much improved    Anemia No sign of signif bleeding at present, but Hgb is  slowly trending down in a pt who will not allow blood transfusions - stopped anticoag - recheck Hgb today and in AM   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 - R side flaccid  likely secondary to bilateral carotid stenosis in the setting of episodic hypotension with hypertensive emergency secondary to cocaine use - has stabilized, and over the last 48hrs has markedly improved in regard to alertness and interaction - nonetheless, continues to need lifelong SNF placement   B carotid stenosis  carotid doppler this admit noted left CCA > 50%, left ICA 60-79%, and right ICA 40-59% stenosis - previously noted as early as 07/2106 on MRI  HTN must exercise care to avoid overcorrection in this setting - SBP goal is 140-160 in setting of B carotid stenosis and watershed infarcts - w/ systolics dipping close to 100 will stop some BP meds and follow    R basilic vein non-occlusive DVT Noted on venous duplex 05/02/17 - I find no evidence of a prior PICC in the R UE during this hospital stay - have been anticoagulating w/ heparin, but w/ Hgb dropping in patient who will not allow transfusion we must stop anticoag for now and follow   Dysphagia SLP following - remains NPO - now s/p PEG - resume PEG feeds today - SLP re-evaluating for possible improved swallow w/ increased alertness  Acute hypoxic respiratory failure in setting of seizure activity  Resolved - sats 100% on RA  Acute encephalopathy Has markedly improved over the last 48hrs- suspect he suffered significant brain injury due to his seizure and strokes - minimized all sedating meds in a stepwise fashion  Acute kidney failure  Due to above - crt has been climbing over the last 48hrs - hydrate and follow trend   Recent Labs  Lab 05/04/17 0253 05/06/17 0737 05/07/17 0223 05/08/17 0843  CREATININE 2.01* 2.31* 2.41* 2.90*    Hypernatremia Due to [facility] - corrected w/ free water   Seizures controlled for now    Cocaine abuse  UDS positive for cocaine and benzos (given by EMS) at time of admit   DM2 A1c 11.8 04/17/17 - CBG poorly controlled - adjust insulin tx w/ resumption of PEG feeding   DVT prophylaxis: SCDs Code Status: FULL CODE Family Communication: no family present at time of exam  Disposition Plan: med/surg   Consultants:  Neurology  PCCM  Antimicrobials:  None    Objective: Blood pressure 137/77, pulse 62, temperature (!) 97.4 F (36.3 C), temperature source Oral, resp. rate 15, height 5\' 8"  (1.727 m), weight 72.8 kg (160 lb 7.9 oz), SpO2 100 %.  Intake/Output Summary (Last 24 hours) at 05/09/2017 1134 Last data filed at 05/09/2017 0423 Gross per 24 hour  Intake 1109.17 ml  Output 650 ml  Net 459.17 ml   Filed Weights   05/02/17 0330 05/03/17 0340 05/04/17 0615  Weight: 72.5 kg (159 lb 13.3 oz) 73.4 kg (161 lb 13.1 oz) 72.8 kg (160 lb 7.9 oz)    Examination: General: No acute respiratory distress - much more interactive  Lungs: CTA B - no wheeze or crackles  Cardiovascular: RRR w/o M or rub  Abdomen: non-distended - thin - no clear abnormality on gross inspection - no induration - no evidence of pain w/ firm palpation around PEG insertion site - no d/c   Extremities: No signif edema B LE   CBC: Recent Labs  Lab 05/04/17 0253 05/05/17 0241 05/06/17 0349 05/07/17 0223 05/08/17 1137  WBC 8.8 11.8* 15.0* 15.7* 11.9*  NEUTROABS  --   --   --   --  9.7*  HGB 7.8* 8.5* 7.7* 8.1* 7.5*  HCT 25.1* 26.9* 24.8* 25.9* 23.3*  MCV 85.4 85.1 84.6 84.9 82.9  PLT 248 256 216 229 628   Basic Metabolic Panel: Recent Labs  Lab 05/04/17 0253 05/06/17 0737 05/07/17 0223 05/08/17 0843  NA 144 140 140 140  K 4.3 5.0 5.0 4.5  CL 113* 110 109 110  CO2 21* 20* 19* 16*  GLUCOSE 172* 232* 178* 250*  BUN 61* 56* 55* 62*  CREATININE 2.01* 2.31* 2.41* 2.90*  CALCIUM 8.3* 8.2* 8.2* 8.0*  MG  --   --   --  2.4   GFR: Estimated Creatinine Clearance: 24.2 mL/min (A) (by C-G  formula based on SCr of 2.9 mg/dL (H)).  Liver Function Tests: Recent Labs  Lab 05/06/17 0737 05/07/17 0223 05/08/17 0843  AST 35 43* 47*  ALT 25 26 26   ALKPHOS 205* 218* 180*  BILITOT 0.5 0.8 0.5  PROT 6.0* 6.2* 5.2*  ALBUMIN 1.9* 1.8* 1.6*    HbA1C: Hgb A1c MFr Bld  Date/Time Value Ref Range Status  04/17/2017 08:30 AM 11.8 (H) 4.8 - 5.6 % Final    Comment:    (NOTE) Pre diabetes:          5.7%-6.4% Diabetes:              >6.4% Glycemic control for   <  7.0% adults with diabetes   07/25/2016 04:38 AM 11.9 (H) 4.8 - 5.6 % Final    Comment:    (NOTE)         Pre-diabetes: 5.7 - 6.4         Diabetes: >6.4         Glycemic control for adults with diabetes: <7.0     CBG: Recent Labs  Lab 05/08/17 1639 05/08/17 1939 05/08/17 2344 05/09/17 0425 05/09/17 0808  GLUCAP 222* 217* 217* 257* 198*     Scheduled Meds: . amLODipine  10 mg Per Tube Daily  . aspirin  325 mg Per Tube Daily  . atorvastatin  40 mg Per Tube q1800  . chlorhexidine  15 mL Mouth/Throat BID  . cloNIDine  0.3 mg Per Tube BID  . folic acid  1 mg Per Tube Daily  . free water  100 mL Per Tube Q8H  . insulin aspart  0-9 Units Subcutaneous Q4H  . labetalol  200 mg Per Tube Q8H  . levETIRAcetam  500 mg Per Tube BID  . thiamine  100 mg Per Tube Daily     LOS: 28 days   [name], MD [facility] Office  [phone] Pager - Text Page per Shea Evans as per below:  On-Call/Text Page:      [email or web address]      password [email or web address]  If 7PM-7AM, please contact night-coverage [email or web address] Password [email or web address] 05/09/2017, 11:34 AM

## 2017-05-09 NOTE — Care Management Important Message (Signed)
Important Message  Patient Details  Name: Richard Bennett MRN: [id number] Date of Birth: August 13, 1950   Medicare Important Message Given:  Yes    Richard Bennett P Richard Bennett 05/09/2017, 2:12 PM

## 2017-05-09 NOTE — Progress Notes (Signed)
Modified Barium Swallow Progress Note  Patient Details  Name: Richard Bennett MRN: [id number] Date of Birth: 06/03/1950  Today's Date: 05/09/2017  Modified Barium Swallow completed.  Full report located under Chart Review in the Imaging Section.  Brief recommendations include the following:  Clinical Impression  Pt exhibited improved oral phase function compared to prior MBS. Continues with decreased oral cohesion, lingual strength (lingual residue) and incomplete transfer (piecemeal swallow) however to a much lesser degree. Pharyngeal phase marked by delayed swallow initiation intermittently to the pyrifrom sinuses, decreased laryngeal closure leading to penetration with thin to vocal cords (silent) and nectar with questionable trace aspiration. Min-mild vallecular residue due to reduced epiglottic inversion. Esophageal scan did not reveal abnormalities (no esophageal dx with MBS). He continues to be at risk for aspiration given severe cognitive-language impairments. Recommend Dys 1 (puree), honey thick liquids, given additional time to swallow second time and clear oral cavity, sit upright, small bites/sips.       Swallow Evaluation Recommendations       SLP Diet Recommendations: Dysphagia 1 (Puree) solids;Honey thick liquids   Liquid Administration via: Cup;No straw   Medication Administration: Crushed with puree   Supervision: Patient able to self feed;Full supervision/cueing for compensatory strategies   Compensations: Minimize environmental distractions;Slow rate;Small sips/bites;Lingual sweep for clearance of pocketing;Multiple dry swallows after each bite/sip   Postural Changes: Seated upright at 90 degrees   Oral Care Recommendations: Oral care BID   Other Recommendations: Order thickener from pharmacy    Houston Siren 05/09/2017,3:28 PM  Orbie Pyo Colvin Caroli.Ed [employer] [phone]

## 2017-05-09 NOTE — Clinical Social Work Note (Signed)
CSW continues to follow for discharge needs.  Aylah Yeary, CSW [phone]  

## 2017-05-09 NOTE — Progress Notes (Signed)
Nutrition Follow-up  DOCUMENTATION CODES:   Not applicable  INTERVENTION:   -Continue Osmolite 1.5 @ 20 ml/hr via PEG and increase by 10 ml every 4 hours to goal rate of 50 ml/hr.   Tube feeding regimen provides 1800 kcal (95% of needs), 75 grams of protein, and 914 ml of H2O.   -Magic Cup TID with meals -MVI daily  NUTRITION DIAGNOSIS:   Inadequate oral intake related to inability to eat as evidenced by NPO status.  Ongoing  GOAL:   Patient will meet greater than or equal to 90% of their needs  Progressing  MONITOR:   TF tolerance, Diet advancement, Labs, I & O's  REASON FOR ASSESSMENT:   Ventilator, Consult(verbal) Enteral/tube feeding initiation and management  ASSESSMENT:   67 yo male with PMH of HTN, HLD, Jehovah's witness, DM-2, stroke who was admitted on 2/8 with seizures, HTN emergency, nonketotic hyperglycemia. Required intubation on admission.  2/23- s/p BSE, recommended continued NPO status, transferred from ICU to SDU 2/26- PEG placed 2/27- NGT removed 3/1- s/p MBSS, recommend continue NPO status; rt basilic vein non-occlusive DVT found 3/4- TF held due to blood coming from PEG tube site and distended abdomen  Pt sitting up in bed at time of visit; more alert and interactive today. Per MD notes, TF to be reinstated today. Plan to initiate Osmolite 1.5 @ 20 ml/hr and advance 12 ml/hr every 4 hours to goal rate of 50 ml/hr. However, TF was not yet started at time of visit.   Per SLP notes, plan for MBSS today. ADDENDUM: pt advanced to a dysphagia 1 diet with honey thick liquids s/p MBSS this afternoon. RD will add Magic Cup to support nutritional needs.  Labs reviewed: CBGS: [id number] (inpatient orders for glycemic control are 0-9 units insulin aspart every 4 hours and 10 units insulin glargine daily).   Diet Order:  Seizure precautions DIET - DYS 1 Room service appropriate? Yes; Fluid consistency: Honey Thick  EDUCATION NEEDS:   No education needs  have been identified at this time  Skin:  Skin Assessment: Skin Integrity Issues: Skin Integrity Issues:: Stage II, Other (Comment) Stage II: sacrum Other: laceration to head  Last BM:  05/05/17  Height:   Ht Readings from Last 1 Encounters:  04/11/17 5\' 8"  (1.727 m)    Weight:   Wt Readings from Last 1 Encounters:  05/04/17 160 lb 7.9 oz (72.8 kg)    Ideal Body Weight:  70 kg  BMI:  Body mass index is 24.4 kg/m.  Estimated Nutritional Needs:   Kcal:  1900-2100  Protein:  105-120 grams  Fluid:  1.9-2.1 L    Merrell Borsuk A. Jimmye Norman, RD, LDN, CDE Pager: [phone] After hours Pager: [phone]

## 2017-05-09 NOTE — Progress Notes (Signed)
  Speech Language Pathology Treatment: Dysphagia  Patient Details Name: Richard Bennett MRN: [id number] DOB: 1950/11/13 Today's Date: 05/09/2017 Time: 7116-5790 SLP Time Calculation (min) (ACUTE ONLY): 8 min  Assessment / Plan / Recommendation Clinical Impression  Pt continues to be alert with improved interaction given increased time and repetition. Transition and cohesion of nectar thick liquids improved compared to prior sessions/MBS on 3/1. No s/s aspiration; recommend repeat MBS scheduled today at 1330.   HPI HPI: 67 yo M with hx CVA, DM, HTN, presents to the ER with acute encephalopathy and seizures.  Intubated 2/8-2/22. Most recent MRI dated 04/16/2017 is showing new acute infarcts bilaterally left greater then right that appear to be watershed infarcts. Chest xray dated 04/25/2017 is showing no acute cardiopulmonary issues. Patient is known to [facility] service from previous admission with swallowing evaluation dated 07/25/2016 with recommendation for a dysphagia 2 diet with nectar thick liquids      SLP Plan  MBS       Recommendations  Diet recommendations: NPO Medication Administration: Via alternative means                Oral Care Recommendations: Oral care QID Follow up Recommendations: Skilled Nursing facility SLP Visit Diagnosis: Dysphagia, oral phase (R13.11) Attention and concentration deficit following: Cerebral infarction Plan: MBS       GO                Houston Siren 05/09/2017, 10:19 AM  Orbie Pyo Colvin Caroli.[employer] [phone]

## 2017-05-09 NOTE — Progress Notes (Signed)
Physical Therapy Treatment Patient Details Name: Richard Bennett MRN: [id number] DOB: 18-Jun-1950 Today's Date: 05/09/2017    History of Present Illness Pt is a 67 y.o. male admitted 04/11/17 with acute encephalopathy and seizures; UDS positive for cocaine and benzos. Intub 2/8-2/22. Required CRRT for a brief period. MRI 2/8 showed no acute intracranial abnormality. MRI 2/13 showed new bilateral infarcts (L>R), thought to be watershed infarcts; per cardiology, likely secondary to bilateral carotid stenosis in setting of HTN emergency secondary to cocaine use. MRA 2/13 negative for L MCA branch occlusion. No new brain imaging since 2/13. PMH includes CVA (07/2016), DM, HTN.   PT Comments    Pt demonstrating improved cognition and mobility this session. Continues to demonstrate R-sided hemiplegia and significant inattention with L-side gaze preference. Able to follow commands simple one-step commands 50-75%, at times with 5-30 sec response time. Required maxA to sit EOB; initially requiring min guard for seated balance, with mod-maxA to correct many anterior/posterior LOB while sitting. Recommend +2 assist or lift for transfers to chair. Frequency updated to reflect patient's improvement. Will follow acutely.   Follow Up Recommendations  SNF     Equipment Recommendations  (TBD next venue)    Recommendations for Other Services OT consult     Precautions / Restrictions Precautions Precautions: Fall Precaution Comments: PEG, R-side inattention Restrictions Weight Bearing Restrictions: No    Mobility  Bed Mobility Overal bed mobility: Needs Assistance Bed Mobility: Rolling;Sidelying to Sit;Sit to Supine           General bed mobility comments: Able to roll to R-side with minA, total A to roll towards L-side for repositioning; maxA+2 to scoot up in bed. Able to sit EOB with maxA(+1) to assist BLEs to EOB and trunk elevation; R-side flaccid throughout  Transfers                  General transfer comment: NT today. Recommend +2 for squat pivot transfer or use of lift  Ambulation/Gait                 Stairs            Wheelchair Mobility    Modified Rankin (Stroke Patients Only) Modified Rankin (Stroke Patients Only) Pre-Morbid Rankin Score: No symptoms Modified Rankin: Severe disability     Balance Overall balance assessment: Needs assistance     Sitting balance - Comments: Able to sit with support of LUE and min guard, but easily off balance requiring mod-maxA to correct from falling in all directions Postural control: Right lateral lean;Posterior lean                                  Cognition Arousal/Alertness: Awake/alert Behavior During Therapy: Flat affect Overall Cognitive Status: Impaired/Different from baseline Area of Impairment: Orientation;Attention;Following commands;Safety/judgement;Awareness;Problem solving                 Orientation Level: Disoriented to;Time;Situation Current Attention Level: Sustained   Following Commands: Follows one step commands inconsistently;Follows one step commands with increased time;Follows multi-step commands inconsistently Safety/Judgement: Decreased awareness of deficits Awareness: Intellectual Problem Solving: Slow processing;Decreased initiation;Difficulty sequencing;Requires verbal cues;Requires tactile cues General Comments: Follows simple, one-step commands 50-75% of session. Required 5-30 seconds to respond to questions or directions at times, although answering most questions appropriately. Answers yes/no questions by responding "yes" to most, even if answer is clearly "no"      Exercises  General Comments        Pertinent Vitals/Pain Pain Assessment: No/denies pain Faces Pain Scale: Hurts little more Pain Intervention(s): Monitored during session    Home Living                      Prior Function            PT Goals (current goals  can now be found in the care plan section) Acute Rehab PT Goals Patient Stated Goal: None stated PT Goal Formulation: With patient Time For Goal Achievement: 05/23/17 Potential to Achieve Goals: Fair Progress towards PT goals: Progressing toward goals    Frequency    Min 3X/week      PT Plan Discharge plan needs to be updated    Co-evaluation              AM-PAC PT "6 Clicks" Daily Activity  Outcome Measure  Difficulty turning over in bed (including adjusting bedclothes, sheets and blankets)?: Unable Difficulty moving from lying on back to sitting on the side of the bed? : Unable Difficulty sitting down on and standing up from a chair with arms (e.g., wheelchair, bedside commode, etc,.)?: Unable Help needed moving to and from a bed to chair (including a wheelchair)?: Total Help needed walking in hospital room?: Total Help needed climbing 3-5 steps with a railing? : Total 6 Click Score: 6    End of Session   Activity Tolerance: Patient tolerated treatment well Patient left: in bed;with call bell/phone within reach;with bed alarm set;with nursing/sitter in room Nurse Communication: Mobility status;Need for lift equipment PT Visit Diagnosis: Other abnormalities of gait and mobility (R26.89);Other symptoms and signs involving the nervous system (R29.898);Hemiplegia and hemiparesis Hemiplegia - Right/Left: Right Hemiplegia - caused by: Cerebral infarction     Time: 5597-4163 PT Time Calculation (min) (ACUTE ONLY): 25 min  Charges:  $Therapeutic Activity: 23-37 mins                    G Codes:      Mabeline Caras, PT, DPT Acute Rehab Services  Pager: [place] [facility] 05/09/2017, 5:45 PM

## 2017-05-10 DIAGNOSIS — E870 Hyperosmolality and hypernatremia: Secondary | ICD-10-CM

## 2017-05-10 DIAGNOSIS — D72829 Elevated white blood cell count, unspecified: Secondary | ICD-10-CM

## 2017-05-10 LAB — COMPREHENSIVE METABOLIC PANEL
ALK PHOS: 200 U/L — AB (ref 38–126)
ALT: 37 U/L (ref 17–63)
AST: 74 U/L — AB (ref 15–41)
Albumin: 1.8 g/dL — ABNORMAL LOW (ref 3.5–5.0)
Anion gap: 13 (ref 5–15)
BILIRUBIN TOTAL: 0.5 mg/dL (ref 0.3–1.2)
BUN: 58 mg/dL — AB (ref 6–20)
CALCIUM: 8 mg/dL — AB (ref 8.9–10.3)
CO2: 17 mmol/L — ABNORMAL LOW (ref 22–32)
CREATININE: 2.84 mg/dL — AB (ref 0.61–1.24)
Chloride: 111 mmol/L (ref 101–111)
GFR calc Af Amer: 25 mL/min — ABNORMAL LOW (ref >60)
GFR, EST NON AFRICAN AMERICAN: 22 mL/min — AB (ref >60)
Glucose, Bld: 250 mg/dL — ABNORMAL HIGH (ref 65–99)
POTASSIUM: 4.1 mmol/L (ref 3.5–5.1)
Sodium: 141 mmol/L (ref 135–145)
TOTAL PROTEIN: 5.9 g/dL — AB (ref 6.5–8.1)

## 2017-05-10 LAB — GLUCOSE, CAPILLARY
GLUCOSE-CAPILLARY: 248 mg/dL — AB (ref 65–99)
GLUCOSE-CAPILLARY: 320 mg/dL — AB (ref 65–99)
GLUCOSE-CAPILLARY: 278 mg/dL — AB (ref 65–99)
GLUCOSE-CAPILLARY: 220 mg/dL — AB (ref 65–99)
Glucose-Capillary: 232 mg/dL — ABNORMAL HIGH (ref 65–99)
Glucose-Capillary: 227 mg/dL — ABNORMAL HIGH (ref 65–99)

## 2017-05-10 LAB — CBC
HEMATOCRIT: 23.2 % — AB (ref 39.0–52.0)
Hemoglobin: 7.6 g/dL — ABNORMAL LOW (ref 13.0–17.0)
MCH: 27 pg (ref 26.0–34.0)
MCHC: 32.8 g/dL (ref 30.0–36.0)
MCV: 82.6 fL (ref 78.0–100.0)
Platelets: 211 10*3/uL (ref 150–400)
RBC: 2.81 MIL/uL — ABNORMAL LOW (ref 4.22–5.81)
RDW: 13.7 % (ref 11.5–15.5)
WBC: 7.9 10*3/uL (ref 4.0–10.5)

## 2017-05-10 NOTE — Clinical Social Work Note (Addendum)
Facility with have appropriate material for food feeds 3/10. Facility requesting pt get to the facility around 11. CSW will reach out to family. MD notified and agreeable. CSW will set up transport on 3/10 for 10:30 am.  2:03 Spoke with pt's son. Pt's son agreeable to d/c around 10:30-11:00 tomorrow--CSW will call once transport is set up.   [place], [place]

## 2017-05-10 NOTE — Plan of Care (Signed)
Patient was much more alert and talkative today; able to answer orientation questions x 2-3 (not time) and able to express needs via short words or statements.  Patient transferred up to chair and back to bed today for approx three hours - tolerated well.  Right side remains flaccid. Consuming PO diet per dysphagia I diet and honey thickened liquids w/o any sign of aspiration, coughing or choking.  Tube feeding continues. Patient expected to discharge to [facility] tomorrow morning.  Will continue to monitor.

## 2017-05-10 NOTE — Progress Notes (Addendum)
PROGRESS NOTE    Richard Bennett  ATF:[id number] DOB: 1950/07/05 DOA: 04/11/2017 PCP: [name], MD   Brief Narrative:   67 y.o. 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  Per the report patient was wandering around and is nonverbal and later found to have a generalized tonic-clonic seizure.  EMS was called and patient was given Versed.  No further history is available as family is not able to be reached.  Patient is encephalopathic.     Subjective: 3/9 A/O 3 (does not know when), negative CP, negative SOB, negative abdominal pain. Consumed 100% of his lunch. Follows commands.      Assessment & Plan:   Principal Problem:   Status epilepticus ([place]) Active Problems:   Hypertensive emergency   Seizure ([place])   CKD (chronic kidney disease) stage 3, GFR 30-59 ml/min (HCC)   Hyperosmolar non-ketotic state in patient with type 2 diabetes mellitus ([place])   Acute respiratory failure with hypoxia ([place])   Acute encephalopathy   Cerebral thrombosis with cerebral infarction   Dysphagia   Goals of care, counseling/discussion   Palliative care encounter   Pressure injury of skin  Acute Bilateral Watershed infarct -Secondary to cocaine use -Significantly improved and ready for discharge.   Acute respiratory failure with hypoxia -Resolved -Titrate O2 to maintain SPO2> 93%   Acute encephalopathy -Opens eyes, spontaneously moves LUE. Beginning to answer some questions appropriately -Sitting in chair comfortably, not eating his meals. Following commands and answering most questions appropriately  Acute on CKD Stage 3 (baselineCr 1.87-2.18) Recent Labs  Lab 05/04/17 0253 05/06/17 0737 05/07/17 0223 05/08/17 0843 05/10/17 0551  CREATININE 2.01* 2.31* 2.41* 2.90* 2.84*  -Hold all nephrotoxic  medication -Hydrate and monitor.  Hypernatremia -Resolved  Seizures -Keppra 500 mg  BID  -Seizure protocol  Essential HTN -Clonidine 0.2 mg BID -Labetalol 200 mg TID -Hydralazine PRN  Dysphagia -Secondary to bilateral watershed infarct.  -2/26  S/P PEG tube placement .  - 3/8 passed swallowing evaluation: Dysphagia 1 honey thick liquids.  Polysubstance abuse  -On admission positive for cocaine and benzodiazepine  Anemia chronic disease -No serious signs of bleeding. Some dried blood around right nare. -Anemia panel: Most consistent with anemia of chronic disease Recent Labs  Lab 05/04/17 0253 05/05/17 0241 05/06/17 0349 05/07/17 0223 05/08/17 1137 05/09/17 1206 05/10/17 0551  HGB 7.8* 8.5* 7.7* 8.1* 7.5* 7.4* 7.6*  -Stable  RUE swelling/positive nonocclusive DVT -Doppler pending: RIGHT upper extremity basilic vein positive nonocclusive DVT - This in a patient who for religious beliefs will not allow transfusion. Unfortunately cannot anticoagulate patient secondary to continued hemoglobin drop.   Sepsis due to unspecified organism -Meets criteria for sepsis. WBC> 12, temp> 30C, site of infection LLQ abdominal wall. -Continue current empiric antibiotics  FUO/Leukocytosis -continue LLQ abdominal pain -Afebrile last 24 hrs -See sepsis  LLQ Abdominal pain/PEG insertion site pain -CT abdomen/pelvis nondiagnostic for acute cause of pain. -Although CT scan negative upon exam area warm to touch. Hard to touch: Abdominal wall cellulitis? No evidence of abscess on CT of abdomen.  -Continue tube feeds  Elevated liver enzyme -improving continue to monitor     Goals of care -2/24 PALLIATIVE CARE: Consult placed.Bilateral Watershed infarct, eyes open unresponsive. Family with on realistic expectations. Address CODE STATUS should be DO NOT RESUSCITATE,     DVT prophylaxis: Subcutaneous heparin  Code Status: Full Family Communication: None Disposition Plan:  SNF;.      Consultants:  Stroke team [facility] M     Procedures/Significant Events:  2/08  Admit with acute encephalopathy, seizures, hyperglycemia, and HTN emergency 2/09  Started on Dilantin per neurology  2/10  Sedated and now with right hemiparesis for an MRI 2/14  Hemodialysis 2/15  Tolerating CPAP/PS but not following commands 2/20  Had to be placed back on vent support, still not following commands 2/22  Extubated  2/26 PEG tube placement 3/1 Modified Barium Swallow study. Failed 3/4 CT abdomen pelvis WO contrast:Small amount of nonspecific perisplenic fluid.Otherwise, no acute abdominal or pelvic pathology. -Small bilateral pleural effusion with dependent atelectasis. -Gastrostomy tube in satisfactory position.     I have personally reviewed and interpreted all radiology studies and my findings are as above.  VENTILATOR SETTINGS: None   Cultures 2/8 blood negative 3/5 blood pending     Antimicrobials: Anti-infectives (From admission, onward)   Start     Stop   05/06/17 2200  piperacillin-tazobactam (ZOSYN) IVPB 3.375 g         04/28/17 0930  ceFAZolin (ANCEF) IVPB 2g/100 mL premix     04/29/17 1603   04/11/17 1345  Ampicillin-Sulbactam (UNASYN) 3 g in sodium chloride 0.9 % 100 mL IVPB     04/17/17 1315   04/11/17 0730  Ampicillin-Sulbactam (UNASYN) 3 g in sodium chloride 0.9 % 100 mL IVPB  Status:  Discontinued     04/11/17 1330       Devices   LINES / TUBES:  ETT 2/8 >> 2/22 OG tube 2/8 >> 2/22  R IJ HD cath 2/13 >> 2/22 Foley 2/10 (placed by urology secondary to urethral stricture) >> out  PEG tube 2/26>>    Continuous Infusions: . feeding supplement (OSMOLITE 1.5 CAL) 1,000 mL (05/10/17 3267)  . piperacillin-tazobactam 3.375 g (05/10/17 1245)     Objective: Vitals:   05/09/17 1920 05/09/17 2248 05/09/17 2341 05/10/17 0000  BP: 134/75 134/65 139/78   Pulse: 63 76 64 63  Resp: 16  16   Temp:   (!) 97.4 F (36.3 C)   TempSrc: Oral   Oral   SpO2: 98%  100% 99%  Weight:      Height:        Intake/Output Summary (Last 24 hours) at 05/10/2017 0855 Last data filed at 05/10/2017 8099 Gross per 24 hour  Intake 620 ml  Output 450 ml  Net 170 ml   Filed Weights   05/02/17 0330 05/03/17 0340 05/04/17 0615  Weight: 159 lb 13.3 oz (72.5 kg) 161 lb 13.1 oz (73.4 kg) 160 lb 7.9 oz (72.8 kg)   Physical Exam:  General:  A/O 3 (does not know when), follows commands, sitting in chair, No acute respiratory distress Neck:  Negative scars, masses, torticollis, lymphadenopathy, JVD Lungs: Clear to auscultation bilaterally without wheezes or crackles Cardiovascular: Regular rate and rhythm without murmur gallop or rub normal S1 and S2 Abdomen: negative abdominal pain, nondistended, positive soft, bowel sounds, no rebound, no ascites, no appreciable mass, PEG tube present Extremities: No significant cyanosis, clubbing, or edema bilateral lower extremities Skin: Negative rashes, lesions, ulcers Psychiatric:  Negative depression, negative anxiety, negative fatigue, negative mania  Central nervous system:  Cranial nerves II through XII intact, tongue/uvula midline, LUE/LLE muscle strength 3-4/5, mild withdrawal to painful stimuli RLE and RU E, sensation intact throughout,  negative dysarthria, negative expressive aphasia, negative receptive aphasia.  Marland Kitchen  Data Reviewed: Care during the described time interval was provided by me .  I have reviewed this patient's available data, including medical history, events of note, physical examination, and all test results as part of my evaluation.   CBC: Recent Labs  Lab 05/06/17 0349 05/07/17 0223 05/08/17 1137 05/09/17 1206 05/10/17 0551  WBC 15.0* 15.7* 11.9* 9.9 7.9  NEUTROABS  --   --  9.7*  --   --   HGB 7.7* 8.1* 7.5* 7.4* 7.6*  HCT 24.8* 25.9* 23.3* 23.2* 23.2*  MCV 84.6 84.9 82.9 80.8 82.6  PLT 216 229 203 202 616   Basic Metabolic Panel: Recent Labs  Lab 05/04/17 0253  05/06/17 0737 05/07/17 0223 05/08/17 0843 05/10/17 0551  NA 144 140 140 140 141  K 4.3 5.0 5.0 4.5 4.1  CL 113* 110 109 110 111  CO2 21* 20* 19* 16* 17*  GLUCOSE 172* 232* 178* 250* 250*  BUN 61* 56* 55* 62* 58*  CREATININE 2.01* 2.31* 2.41* 2.90* 2.84*  CALCIUM 8.3* 8.2* 8.2* 8.0* 8.0*  MG  --   --   --  2.4  --    GFR: Estimated Creatinine Clearance: 24.8 mL/min (A) (by C-G formula based on SCr of 2.84 mg/dL (H)). Liver Function Tests: Recent Labs  Lab 05/06/17 0737 05/07/17 0223 05/08/17 0843 05/10/17 0551  AST 35 43* 47* 74*  ALT 25 26 26  37  ALKPHOS 205* 218* 180* 200*  BILITOT 0.5 0.8 0.5 0.5  PROT 6.0* 6.2* 5.2* 5.9*  ALBUMIN 1.9* 1.8* 1.6* 1.8*   No results for input(s): LIPASE, AMYLASE in the last 168 hours. No results for input(s): AMMONIA in the last 168 hours. Coagulation Profile: No results for input(s): INR, PROTIME in the last 168 hours. Cardiac Enzymes: No results for input(s): CKTOTAL, CKMB, CKMBINDEX, TROPONINI in the last 168 hours. BNP (last 3 results) No results for input(s): PROBNP in the last 8760 hours. HbA1C: No results for input(s): HGBA1C in the last 72 hours. CBG: Recent Labs  Lab 05/09/17 1544 05/09/17 1924 05/09/17 2346 05/10/17 0503 05/10/17 0807  GLUCAP 198* 139* 198* 232* 248*   Lipid Profile: No results for input(s): CHOL, HDL, LDLCALC, TRIG, CHOLHDL, LDLDIRECT in the last 72 hours. Thyroid Function Tests: No results for input(s): TSH, T4TOTAL, FREET4, T3FREE, THYROIDAB in the last 72 hours. Anemia Panel: No results for input(s): VITAMINB12, FOLATE, FERRITIN, TIBC, IRON, RETICCTPCT in the last 72 hours. Urine analysis:    Component Value Date/Time   COLORURINE STRAW (A) 04/11/2017 0447   APPEARANCEUR CLEAR 04/11/2017 0447   LABSPEC 1.013 04/11/2017 0447   PHURINE 7.0 04/11/2017 0447   GLUCOSEU >=500 (A) 04/11/2017 0447   HGBUR SMALL (A) 04/11/2017 0447   BILIRUBINUR NEGATIVE 04/11/2017 0447   KETONESUR NEGATIVE  04/11/2017 0447   PROTEINUR 100 (A) 04/11/2017 0447   NITRITE NEGATIVE 04/11/2017 0447   LEUKOCYTESUR NEGATIVE 04/11/2017 0447   Sepsis Labs: @LABRCNTIP (procalcitonin:4,lacticidven:4)  ) Recent Results (from the past 240 hour(s))  Culture, blood (routine x 2)     Status: None (Preliminary result)   Collection Time: 05/06/17  7:11 PM  Result Value Ref Range Status   Specimen Description BLOOD LEFT HAND  Final   Special Requests IN PEDIATRIC BOTTLE Blood Culture adequate volume  Final   Culture   Final    NO GROWTH 3 DAYS Performed at [facility], [place] [address], [place], [place] [address]    Report Status PENDING  Incomplete  Culture, blood (routine x 2)  Status: None (Preliminary result)   Collection Time: 05/06/17  7:12 PM  Result Value Ref Range Status   Specimen Description BLOOD LEFT HAND  Final   Special Requests IN PEDIATRIC BOTTLE Blood Culture adequate volume  Final   Culture   Final    NO GROWTH 3 DAYS Performed at [facility], [place] [address], [place], [place] [address]    Report Status PENDING  Incomplete         Radiology Studies: Dg Abd 1 View  Result Date: 05/08/2017 CLINICAL DATA:  Possible upper GI bleed with dark blood coming from the patient's indwelling gastrostomy tube. EXAM: ABDOMEN - 1 VIEW COMPARISON:  CT abdomen and pelvis 05/05/2017. FINDINGS: Bowel gas pattern unremarkable without evidence of obstruction or significant ileus. Moderate to large stool burden in the colon. Residual oral contrast material in the colon related to the CT 3 days ago. Gastrostomy tube tip projects over the expected location of the distal body of the stomach. Prior L4-5 fusion. IMPRESSION: No acute abdominal abnormality. As there is residual oral contrast in the colon from the CT 3 days ago, a hypotonic colon without associated distension is suspected. Electronically Signed   By: Evangeline Dakin M.D.   On: 05/08/2017 22:04   Dg Swallowing Func-speech  Pathology  Result Date: 05/09/2017 Objective Swallowing Evaluation: Type of Study: MBS-Modified Barium Swallow Study  Patient Details Name: Mozell Hardacre MRN: [id number] Date of Birth: 17-Sep-1950 Today's Date: 05/09/2017 Time: SLP Start Time (ACUTE ONLY): 1326 -SLP Stop Time (ACUTE ONLY): 1345 SLP Time Calculation (min) (ACUTE ONLY): 19 min Past Medical History: Past Medical History: Diagnosis Date . Chest pain  . Colon polyps   adenomatous . Hyperlipidemia  . Hypertension  . Refusal of blood transfusions as patient is Jehovah's Witness  . Stroke ([place])  . Type II diabetes mellitus ([place])  Past Surgical History: Past Surgical History: Procedure Laterality Date . APPENDECTOMY   . BACK SURGERY   . IR GASTROSTOMY TUBE MOD SED  04/29/2017 . LUMBAR DISC SURGERY    "herniated" HPI: 67 yo M with hx CVA, DM, HTN, presents to the ER with acute encephalopathy and seizures.  Intubated 2/8-2/22. Most recent MRI dated 04/16/2017 is showing new acute infarcts bilaterally left greater then right that appear to be watershed infarcts. Chest xray dated 04/25/2017 is showing no acute cardiopulmonary issues. MBS 05/02/17 recommended NPO. Increased oral cohesion, control and transit today at bedside therefore repeat MBS recommended.  Subjective: The patient was seen sitting upright in bed.  Assessment / Plan / Recommendation CHL IP CLINICAL IMPRESSIONS 05/09/2017 Clinical Impression Pt exhibited improved oral phase function compared to prior MBS. Continues with decreased oral cohesion, lingual strength (lingual residue) and incomplete transfer (piecemeal swallow) however to a much lesser degree. Pharyngeal phase marked by delayed swallow initiation intermittently to the pyrifrom sinuses, decreased laryngeal closure leading to penetration with thin to vocal cords (silent) and nectar with questionable trace aspiration. Min-mild vallecular residue due to reduced epiglottic inversion. Esophageal scan did not reveal abnormalities (no esophageal dx  with MBS). He continues to be at risk for aspiration given severe cognitive-language impairments. Recommend Dys 1 (puree), honey thick liquids, given additional time to swallow second time and clear oral cavity, sit upright, small bites/sips.     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 Frontal lobe and executive function deficit following -- Impact on safety and function Moderate aspiration risk   CHL IP TREATMENT RECOMMENDATION 05/09/2017 Treatment  Recommendations Therapy as outlined in treatment plan below   Prognosis 05/09/2017 Prognosis for Safe Diet Advancement Fair Barriers to Reach Goals Cognitive deficits;Severity of deficits Barriers/Prognosis Comment -- CHL IP DIET RECOMMENDATION 05/09/2017 SLP Diet Recommendations Dysphagia 1 (Puree) solids;Honey thick liquids Liquid Administration via Cup;No straw Medication Administration Crushed with puree Compensations Minimize environmental distractions;Slow rate;Small sips/bites;Lingual sweep for clearance of pocketing;Multiple dry swallows after each bite/sip Postural Changes Seated upright at 90 degrees   CHL IP OTHER RECOMMENDATIONS 05/09/2017 Recommended Consults -- Oral Care Recommendations Oral care BID Other Recommendations Order thickener from pharmacy   CHL IP FOLLOW UP RECOMMENDATIONS 05/09/2017 Follow up Recommendations Skilled Nursing facility   [facility] IP FREQUENCY AND DURATION 05/09/2017 Speech Therapy Frequency (ACUTE ONLY) min 2x/week Treatment Duration 2 weeks      CHL IP ORAL PHASE 05/09/2017 Oral Phase Impaired Oral - Pudding Teaspoon -- Oral - Pudding Cup -- Oral - Honey Teaspoon -- Oral - Honey Cup Piecemeal swallowing;Lingual/palatal residue Oral - Nectar Teaspoon -- Oral - Nectar Cup Piecemeal swallowing;Lingual/palatal residue;Decreased bolus cohesion Oral - Nectar Straw -- Oral - Thin Teaspoon -- Oral - Thin Cup Piecemeal swallowing;Lingual/palatal residue Oral - Thin Straw  Piecemeal swallowing;Lingual/palatal residue Oral - Puree Piecemeal swallowing;Lingual/palatal residue Oral - Mech Soft -- Oral - Regular -- Oral - Multi-Consistency -- Oral - Pill -- Oral Phase - Comment --  CHL IP PHARYNGEAL PHASE 05/09/2017 Pharyngeal Phase Impaired Pharyngeal- Pudding Teaspoon -- Pharyngeal -- Pharyngeal- Pudding Cup -- Pharyngeal -- Pharyngeal- Honey Teaspoon -- Pharyngeal -- Pharyngeal- Honey Cup Pharyngeal residue - valleculae;Reduced pharyngeal peristalsis Pharyngeal -- Pharyngeal- Nectar Teaspoon -- Pharyngeal -- Pharyngeal- Nectar Cup Pharyngeal residue - valleculae;Reduced epiglottic inversion;Penetration/Apiration after swallow Pharyngeal Material enters airway, remains ABOVE vocal cords and not ejected out Pharyngeal- Nectar Straw -- Pharyngeal -- Pharyngeal- Thin Teaspoon -- Pharyngeal -- Pharyngeal- Thin Cup Penetration/Aspiration during swallow;Delayed swallow initiation-pyriform sinuses Pharyngeal Material enters airway, remains ABOVE vocal cords then ejected out Pharyngeal- Thin Straw Penetration/Aspiration during swallow Pharyngeal Material enters airway, CONTACTS cords and not ejected out Pharyngeal- Puree Pharyngeal residue - valleculae;Reduced epiglottic inversion Pharyngeal -- Pharyngeal- Mechanical Soft -- Pharyngeal -- Pharyngeal- Regular -- Pharyngeal -- Pharyngeal- Multi-consistency -- Pharyngeal -- Pharyngeal- Pill -- Pharyngeal -- Pharyngeal Comment --  CHL IP CERVICAL ESOPHAGEAL PHASE 05/09/2017 Cervical Esophageal Phase WFL Pudding Teaspoon -- Pudding Cup -- Honey Teaspoon -- Honey Cup -- Nectar Teaspoon -- Nectar Cup -- Nectar Straw -- Thin Teaspoon -- Thin Cup -- Thin Straw -- Puree -- Mechanical Soft -- Regular -- Multi-consistency -- Pill -- Cervical Esophageal Comment -- No flowsheet data found. [place] Siren 05/09/2017, 3:28 PM lumbosacral                   Scheduled Meds: . aspirin  325 mg Per Tube Daily  . atorvastatin  40 mg Per Tube q1800  .  chlorhexidine  15 mL Mouth/Throat BID  . cloNIDine  0.2 mg Per Tube BID  . folic acid  1 mg Per Tube Daily  . free water  200 mL Per Tube Q8H  . insulin aspart  0-9 Units Subcutaneous Q4H  . insulin glargine  10 Units Subcutaneous Daily  . labetalol  200 mg Per Tube Q8H  . levETIRAcetam  500 mg Per Tube BID  . multivitamin with minerals  1 tablet Oral Daily  . thiamine  100 mg Per Tube Daily   Continuous Infusions: . feeding supplement (OSMOLITE 1.5 CAL) 1,000 mL (05/10/17 2536)  . piperacillin-tazobactam 3.375 g (05/10/17 6440)  LOS: 29 days    Time spent: 40 minutes    Rekia Kujala, Geraldo Docker, MD [facility] Pager [phone]   If 7PM-7AM, please contact night-coverage [email or web address] Password [email or web address] 05/10/2017, 8:55 AM

## 2017-05-11 DIAGNOSIS — E1165 Type 2 diabetes mellitus with hyperglycemia: Secondary | ICD-10-CM

## 2017-05-11 DIAGNOSIS — E118 Type 2 diabetes mellitus with unspecified complications: Secondary | ICD-10-CM

## 2017-05-11 LAB — BASIC METABOLIC PANEL
Anion gap: 10 (ref 5–15)
BUN: 60 mg/dL — AB (ref 6–20)
CO2: 19 mmol/L — ABNORMAL LOW (ref 22–32)
Calcium: 7.8 mg/dL — ABNORMAL LOW (ref 8.9–10.3)
Chloride: 113 mmol/L — ABNORMAL HIGH (ref 101–111)
Creatinine, Ser: 3.12 mg/dL — ABNORMAL HIGH (ref 0.61–1.24)
GFR calc Af Amer: 22 mL/min — ABNORMAL LOW (ref >60)
GFR calc non Af Amer: 19 mL/min — ABNORMAL LOW (ref >60)
GLUCOSE: 434 mg/dL — AB (ref 65–99)
POTASSIUM: 3.5 mmol/L (ref 3.5–5.1)
Sodium: 142 mmol/L (ref 135–145)

## 2017-05-11 LAB — GLUCOSE, CAPILLARY
GLUCOSE-CAPILLARY: 148 mg/dL — AB (ref 65–99)
GLUCOSE-CAPILLARY: 152 mg/dL — AB (ref 65–99)
GLUCOSE-CAPILLARY: 278 mg/dL — AB (ref 65–99)
Glucose-Capillary: 321 mg/dL — ABNORMAL HIGH (ref 65–99)
Glucose-Capillary: 342 mg/dL — ABNORMAL HIGH (ref 65–99)
Glucose-Capillary: 415 mg/dL — ABNORMAL HIGH (ref 65–99)

## 2017-05-11 LAB — CULTURE, BLOOD (ROUTINE X 2)
CULTURE: NO GROWTH
Culture: NO GROWTH
Special Requests: ADEQUATE
Special Requests: ADEQUATE

## 2017-05-11 LAB — CBC
HCT: 23.5 % — ABNORMAL LOW (ref 39.0–52.0)
HEMOGLOBIN: 7.4 g/dL — AB (ref 13.0–17.0)
MCH: 25.9 pg — ABNORMAL LOW (ref 26.0–34.0)
MCHC: 31.5 g/dL (ref 30.0–36.0)
MCV: 82.2 fL (ref 78.0–100.0)
Platelets: 228 10*3/uL (ref 150–400)
RBC: 2.86 MIL/uL — ABNORMAL LOW (ref 4.22–5.81)
RDW: 13.5 % (ref 11.5–15.5)
WBC: 7.7 10*3/uL (ref 4.0–10.5)

## 2017-05-11 LAB — LIPID PANEL
CHOL/HDL RATIO: 7.3 ratio
Cholesterol: 124 mg/dL (ref 0–200)
HDL: 17 mg/dL — AB (ref >40)
LDL CALC: 65 mg/dL (ref 0–99)
Triglycerides: 212 mg/dL — ABNORMAL HIGH (ref <150)
VLDL: 42 mg/dL — ABNORMAL HIGH (ref 0–40)

## 2017-05-11 LAB — MAGNESIUM: Magnesium: 2.3 mg/dL (ref 1.7–2.4)

## 2017-05-11 LAB — HEMOGLOBIN A1C
HEMOGLOBIN A1C: 10.6 % — AB (ref 4.8–5.6)
Mean Plasma Glucose: 257.52 mg/dL

## 2017-05-11 MED ORDER — INSULIN ASPART 100 UNIT/ML ~~LOC~~ SOLN
0.0000 [IU] | SUBCUTANEOUS | Status: DC
  Administered 2017-05-11: 2 [IU] via SUBCUTANEOUS
  Administered 2017-05-11: 8 [IU] via SUBCUTANEOUS
  Administered 2017-05-11: 3 [IU] via SUBCUTANEOUS
  Administered 2017-05-12: 2 [IU] via SUBCUTANEOUS

## 2017-05-11 MED ORDER — INSULIN GLARGINE 100 UNIT/ML ~~LOC~~ SOLN
18.0000 [IU] | Freq: Every day | SUBCUTANEOUS | Status: DC
  Administered 2017-05-11 – 2017-05-13 (×3): 18 [IU] via SUBCUTANEOUS
  Filled 2017-05-11 (×4): qty 0.18

## 2017-05-11 MED ORDER — SODIUM CHLORIDE 0.9 % IV SOLN
INTRAVENOUS | Status: DC
  Administered 2017-05-11 – 2017-05-12 (×2): via INTRAVENOUS
  Administered 2017-05-13 (×2): 150 mL via INTRAVENOUS

## 2017-05-11 MED ORDER — INSULIN ASPART 100 UNIT/ML ~~LOC~~ SOLN
8.0000 [IU] | Freq: Three times a day (TID) | SUBCUTANEOUS | Status: DC
Start: 2017-05-11 — End: 2017-05-12
  Administered 2017-05-12 (×2): 8 [IU] via SUBCUTANEOUS

## 2017-05-11 NOTE — Progress Notes (Signed)
PROGRESS NOTE    Richard Bennett  WIO:[id number] DOB: November 29, 1950 DOA: 04/11/2017 PCP: [name], MD   Brief Narrative:   67 y.o. 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  Per the report patient was wandering around and is nonverbal and later found to have a generalized tonic-clonic seizure.  EMS was called and patient was given Versed.  No further history is available as family is not able to be reached.  Patient is encephalopathic.     Subjective: 3/10 A/O 3 (does not when), negative CP, negative SOB, negative abdominal pain. Watching the basketball game on television. States his favorite foot on team is [employer]..      Assessment & Plan:   Principal Problem:   Status epilepticus ([place]) Active Problems:   Hypertensive emergency   Seizure ([place])   CKD (chronic kidney disease) stage 3, GFR 30-59 ml/min (HCC)   Hyperosmolar non-ketotic state in patient with type 2 diabetes mellitus ([place])   Acute respiratory failure with hypoxia ([place])   Acute encephalopathy   Cerebral thrombosis with cerebral infarction   Dysphagia   Goals of care, counseling/discussion   Palliative care encounter   Pressure injury of skin  Acute Bilateral Watershed infarct -Secondary to cocaine use -Significantly improved and ready for discharge.   Acute respiratory failure with hypoxia -Resolved -Titrate O2 to maintain SPO2> 93%   Acute encephalopathy -Opens eyes, spontaneously moves LUE. Beginning to answer some questions appropriately -Sitting in chair comfortably, not eating his meals. Following commands and answering most questions appropriately  Acute on CKD Stage 3 (baselineCr 1.87-2.18) Recent Labs  Lab 05/06/17 0737 05/07/17 0223 05/08/17 0843 05/10/17 0551 05/11/17 1054  CREATININE 2.31* 2.41*  2.90* 2.84* 3.12*  -Hold all nephrotoxic medication -3/10 start normal saline 122ml/hr  Hypernatremia -Resolved  Seizures -Keppra 500 mg  BID  -Seizure protocol  Essential HTN -Clonidine 0.2 mg BID -Labetalol 200 mg TID -Hydralazine PRN  Dysphagia -Secondary to bilateral watershed infarct.  -2/26  S/P PEG tube placement .  - 3/8 passed swallowing evaluation: Dysphagia 1 honey thick liquids.  Polysubstance abuse  -On admission positive for cocaine and benzodiazepine  Anemia chronic disease -No serious signs of bleeding. Some dried blood around right nare. -Anemia panel: Most consistent with anemia of chronic disease Recent Labs  Lab 05/05/17 0241 05/06/17 0349 05/07/17 0223 05/08/17 1137 05/09/17 1206 05/10/17 0551 05/11/17 1054  HGB 8.5* 7.7* 8.1* 7.5* 7.4* 7.6* 7.4*  -Stable  RUE swelling/positive nonocclusive DVT -Doppler pending: RIGHT upper extremity basilic vein positive nonocclusive DVT - This in a patient who for religious beliefs will not allow transfusion. Unfortunately cannot anticoagulate patient secondary to continued hemoglobin drop.   Sepsis due to unspecified organism -Meets criteria for sepsis. WBC> 12, temp> 30C, site of infection LLQ abdominal wall. -Continue current empiric antibiotics  FUO/Leukocytosis -continue LLQ abdominal pain -Afebrile last 24 hrs -See sepsis  LLQ Abdominal pain/PEG insertion site pain -CT abdomen/pelvis nondiagnostic for acute cause of pain. -Although CT scan negative upon exam area warm to touch. Hard to touch: Abdominal wall cellulitis? No evidence of abscess on CT of abdomen.  -Continue tube feeds  Elevated liver enzyme -improving continue to monitor  Diabetes type 2 uncontrolled with complications -4/26 Hemoglobin A1c= 11.8 - 3/10 increase Lantus 18 units daily -3/10 NovoLog 8 units QAC -3/10 moderate SSI  Goals of care -2/24 PALLIATIVE CARE: Consult placed.Bilateral Watershed infarct, eyes open  unresponsive. Family with on realistic expectations. Address CODE STATUS should be DO NOT RESUSCITATE,     DVT prophylaxis: Subcutaneous heparin (hold) secondary to bleed Code Status: Full Family Communication: None Disposition Plan: SNF;.      Consultants:  Stroke team [facility] M     Procedures/Significant Events:  2/08  Admit with acute encephalopathy, seizures, hyperglycemia, and HTN emergency 2/09  Started on Dilantin per neurology  2/10  Sedated and now with right hemiparesis for an MRI 2/14  Hemodialysis 2/15  Tolerating CPAP/PS but not following commands 2/20  Had to be placed back on vent support, still not following commands 2/22  Extubated  2/26 PEG tube placement 3/1 Modified Barium Swallow study. Failed 3/4 CT abdomen pelvis WO contrast:Small amount of nonspecific perisplenic fluid.Otherwise, no acute abdominal or pelvic pathology. -Small bilateral pleural effusion with dependent atelectasis. -Gastrostomy tube in satisfactory position.     I have personally reviewed and interpreted all radiology studies and my findings are as above.  VENTILATOR SETTINGS: None   Cultures 2/8 blood negative 3/5 blood pending     Antimicrobials: Anti-infectives (From admission, onward)   Start     Stop   05/06/17 2200  piperacillin-tazobactam (ZOSYN) IVPB 3.375 g         04/28/17 0930  ceFAZolin (ANCEF) IVPB 2g/100 mL premix     04/29/17 1603   04/11/17 1345  Ampicillin-Sulbactam (UNASYN) 3 g in sodium chloride 0.9 % 100 mL IVPB     04/17/17 1315   04/11/17 0730  Ampicillin-Sulbactam (UNASYN) 3 g in sodium chloride 0.9 % 100 mL IVPB  Status:  Discontinued     04/11/17 1330       Devices   LINES / TUBES:  ETT 2/8 >> 2/22 OG tube 2/8 >> 2/22  R IJ HD cath 2/13 >> 2/22 Foley 2/10 (placed by urology secondary to urethral stricture) >> out  PEG tube 2/26>>    Continuous Infusions: . piperacillin-tazobactam 3.375 g (05/11/17 1316)      Objective: Vitals:   05/10/17 1934 05/11/17 0452 05/11/17 0709 05/11/17 1100  BP: (!) 149/91 (!) 147/75 (!) 152/80 (!) 143/78  Pulse:   72 73  Resp: (!) 22     Temp: (!) 97.4 F (36.3 C) 97.8 F (36.6 C) 97.6 F (36.4 C) 97.8 F (36.6 C)  TempSrc: Oral Oral Oral Oral  SpO2: 91%  100% 100%  Weight:      Height:        Intake/Output Summary (Last 24 hours) at 05/11/2017 1443 Last data filed at 05/11/2017 3220 Gross per 24 hour  Intake 756.66 ml  Output 350 ml  Net 406.66 ml   Filed Weights   05/02/17 0330 05/03/17 0340 05/04/17 0615  Weight: 159 lb 13.3 oz (72.5 kg) 161 lb 13.1 oz (73.4 kg) 160 lb 7.9 oz (72.8 kg)   Physical Exam:  General:  A/O 3 (does not know when), follows commands, lying in bed, No acute respiratory distress Neck:  Negative scars, masses, torticollis, lymphadenopathy, JVD Lungs: Clear to auscultation bilaterally without wheezes or crackles Cardiovascular: Regular rate and rhythm without murmur gallop or rub normal S1 and S2 Abdomen: negative abdominal pain, nondistended, positive soft, bowel sounds, no rebound, no ascites, no appreciable mass, PEG tube present Extremities: No significant cyanosis, clubbing, or edema bilateral lower extremities Skin: Negative rashes, lesions, ulcers Psychiatric:  Negative depression, negative anxiety, negative fatigue, negative mania  Central nervous  system:  Cranial nerves II through XII intact, tongue/uvula midline, LUE/LLE muscle strength 3-4/5, mild withdrawal to painful stimuli RLE and RU E, sensation intact throughout,  negative dysarthria, negative expressive aphasia, negative receptive aphasia.  .     Data Reviewed: Care during the described time interval was provided by me .  I have reviewed this patient's available data, including medical history, events of note, physical examination, and all test results as part of my evaluation.   CBC: Recent Labs  Lab 05/07/17 0223 05/08/17 1137 05/09/17 1206  05/10/17 0551 05/11/17 1054  WBC 15.7* 11.9* 9.9 7.9 7.7  NEUTROABS  --  9.7*  --   --   --   HGB 8.1* 7.5* 7.4* 7.6* 7.4*  HCT 25.9* 23.3* 23.2* 23.2* 23.5*  MCV 84.9 82.9 80.8 82.6 82.2  PLT 229 203 202 211 412   Basic Metabolic Panel: Recent Labs  Lab 05/06/17 0737 05/07/17 0223 05/08/17 0843 05/10/17 0551 05/11/17 1054  NA 140 140 140 141 142  K 5.0 5.0 4.5 4.1 3.5  CL 110 109 110 111 113*  CO2 20* 19* 16* 17* 19*  GLUCOSE 232* 178* 250* 250* 434*  BUN 56* 55* 62* 58* 60*  CREATININE 2.31* 2.41* 2.90* 2.84* 3.12*  CALCIUM 8.2* 8.2* 8.0* 8.0* 7.8*  MG  --   --  2.4  --  2.3   GFR: Estimated Creatinine Clearance: 22.5 mL/min (A) (by C-G formula based on SCr of 3.12 mg/dL (H)). Liver Function Tests: Recent Labs  Lab 05/06/17 0737 05/07/17 0223 05/08/17 0843 05/10/17 0551  AST 35 43* 47* 74*  ALT 25 26 26  37  ALKPHOS 205* 218* 180* 200*  BILITOT 0.5 0.8 0.5 0.5  PROT 6.0* 6.2* 5.2* 5.9*  ALBUMIN 1.9* 1.8* 1.6* 1.8*   No results for input(s): LIPASE, AMYLASE in the last 168 hours. No results for input(s): AMMONIA in the last 168 hours. Coagulation Profile: No results for input(s): INR, PROTIME in the last 168 hours. Cardiac Enzymes: No results for input(s): CKTOTAL, CKMB, CKMBINDEX, TROPONINI in the last 168 hours. BNP (last 3 results) No results for input(s): PROBNP in the last 8760 hours. HbA1C: No results for input(s): HGBA1C in the last 72 hours. CBG: Recent Labs  Lab 05/10/17 2016 05/10/17 2322 05/11/17 0451 05/11/17 0812 05/11/17 1152  GLUCAP 220* 227* 321* 342* 415*   Lipid Profile: No results for input(s): CHOL, HDL, LDLCALC, TRIG, CHOLHDL, LDLDIRECT in the last 72 hours. Thyroid Function Tests: No results for input(s): TSH, T4TOTAL, FREET4, T3FREE, THYROIDAB in the last 72 hours. Anemia Panel: No results for input(s): VITAMINB12, FOLATE, FERRITIN, TIBC, IRON, RETICCTPCT in the last 72 hours. Urine analysis:    Component Value Date/Time    COLORURINE STRAW (A) 04/11/2017 0447   APPEARANCEUR CLEAR 04/11/2017 0447   LABSPEC 1.013 04/11/2017 0447   PHURINE 7.0 04/11/2017 0447   GLUCOSEU >=500 (A) 04/11/2017 0447   HGBUR SMALL (A) 04/11/2017 0447   BILIRUBINUR NEGATIVE 04/11/2017 0447   KETONESUR NEGATIVE 04/11/2017 0447   PROTEINUR 100 (A) 04/11/2017 0447   NITRITE NEGATIVE 04/11/2017 0447   LEUKOCYTESUR NEGATIVE 04/11/2017 0447   Sepsis Labs: @LABRCNTIP (procalcitonin:4,lacticidven:4)  ) Recent Results (from the past 240 hour(s))  Culture, blood (routine x 2)     Status: None (Preliminary result)   Collection Time: 05/06/17  7:11 PM  Result Value Ref Range Status   Specimen Description BLOOD LEFT HAND  Final   Special Requests IN PEDIATRIC BOTTLE Blood Culture adequate volume  Final  Culture   Final    NO GROWTH 4 DAYS Performed at [facility], [place] [address], [place], [place] [address]    Report Status PENDING  Incomplete  Culture, blood (routine x 2)     Status: None (Preliminary result)   Collection Time: 05/06/17  7:12 PM  Result Value Ref Range Status   Specimen Description BLOOD LEFT HAND  Final   Special Requests IN PEDIATRIC BOTTLE Blood Culture adequate volume  Final   Culture   Final    NO GROWTH 4 DAYS Performed at [facility], [place] [address], [place], [place] [address]    Report Status PENDING  Incomplete         Radiology Studies: No results found.      Scheduled Meds: . aspirin  325 mg Per Tube Daily  . atorvastatin  40 mg Per Tube q1800  . chlorhexidine  15 mL Mouth/Throat BID  . cloNIDine  0.2 mg Per Tube BID  . folic acid  1 mg Per Tube Daily  . free water  200 mL Per Tube Q8H  . insulin aspart  0-9 Units Subcutaneous Q4H  . insulin glargine  18 Units Subcutaneous Daily  . labetalol  200 mg Per Tube Q8H  . levETIRAcetam  500 mg Per Tube BID  . multivitamin with minerals  1 tablet Oral Daily  . thiamine  100 mg Per Tube Daily   Continuous Infusions: .  piperacillin-tazobactam 3.375 g (05/11/17 1316)     LOS: 30 days    Time spent: 40 minutes    Kailena Lubas, Geraldo Docker, MD [facility] Pager [phone]   If 7PM-7AM, please contact night-coverage [email or web address] Password [facility] 05/11/2017, 2:43 PM

## 2017-05-11 NOTE — Clinical Social Work Note (Addendum)
CSW will set up transport follow d/c summary/order. RN to go ahead and call report to [phone]. Report must be called before pt gets to the facility. Pt will go to room 224B.  [place], [place]

## 2017-05-11 NOTE — Clinical Social Work Note (Signed)
Per MD pt will d/c tomorrow if sugars are stable. Facility notified. Attempted to notify family--awaiting call back.   Richard Bennett, Richard Bennett

## 2017-05-11 NOTE — Progress Notes (Signed)
OT Cancellation Note and defer to SNF  Patient Details Name: Richard Bennett MRN: [id number] DOB: 08-25-1950   Cancelled Treatment:    Reason Eval/Treat Not Completed: Other (comment)(Defer to next venue of care). Pt has discharge and transfer scheduled to dc to [facility] SNF. Per recent RN notes Pt is more alert and communicative. Recommend that OT perform eval at SNF level. Will defer to next venue of care.   Merri Ray Purity Irmen 05/11/2017, 10:01 AM  Hulda Humphrey OTR/L [phone]

## 2017-05-11 NOTE — Progress Notes (Signed)
  Speech Language Pathology Treatment: Dysphagia  Patient Details Name: Richard Bennett MRN: [id number] DOB: 09-27-1950 Today's Date: 05/11/2017 Time: 7001-7494 SLP Time Calculation (min) (ACUTE ONLY): 12 min  Assessment / Plan / Recommendation Clinical Impression  Pt's alertness and interaction continues to improve. He responds verbally to simple biographical questions; he is oriented to location and situation. He is distractible, requiring verbal, tactile cues occasionally to remain focused on PO trials. With min cues, he self-feeds small cup sips of honey thick liquids. Oral control, transfer and timing appear improved. With larger sips he appears to piecemeal, but initiates second swallow rapidly and there is adequate oral clearance. With purees, bolus formation appears mildly prolonged, but pt clears entire bolus with single swallow. Did not provide upgraded trials as pt began to fidget, losing interest in PO. Pt likely to d/c tomorrow. Recommend considering repeat instrumental assessment (MBS or FEES) to determine if diet can be advance; could return as an outpatient if unable to repeat prior to d/c.  Marland Kitchen   HPI HPI: 67 yo M with hx CVA, DM, HTN, presents to the ER with acute encephalopathy and seizures.  Intubated 2/8-2/22. Most recent MRI dated 04/16/2017 is showing new acute infarcts bilaterally left greater then right that appear to be watershed infarcts. Chest xray dated 04/25/2017 is showing no acute cardiopulmonary issues. MBS 05/02/17 recommended NPO. Increased oral cohesion, control and transit today at bedside therefore repeat MBS recommended.       SLP Plan  Continue with current plan of care       Recommendations  Diet recommendations: Dysphagia 1 (puree);Honey-thick liquid Liquids provided via: Cup;Teaspoon Medication Administration: Crushed with puree Supervision: Full supervision/cueing for compensatory strategies;Staff to assist with self feeding Compensations: Minimize  environmental distractions;Slow rate;Small sips/bites;Lingual sweep for clearance of pocketing;Multiple dry swallows after each bite/sip Postural Changes and/or Swallow Maneuvers: Seated upright 90 degrees                Oral Care Recommendations: Oral care BID Follow up Recommendations: Skilled Nursing facility SLP Visit Diagnosis: Dysphagia, oropharyngeal phase (R13.12) Attention and concentration deficit following: Cerebral infarction Plan: Continue with current plan of care       [place], [place], [place] Speech-Language Pathologist [place] 05/11/2017, 5:18 PM

## 2017-05-12 LAB — GLUCOSE, CAPILLARY
GLUCOSE-CAPILLARY: 130 mg/dL — AB (ref 65–99)
GLUCOSE-CAPILLARY: 115 mg/dL — AB (ref 65–99)
GLUCOSE-CAPILLARY: 103 mg/dL — AB (ref 65–99)
GLUCOSE-CAPILLARY: 53 mg/dL — AB (ref 65–99)
GLUCOSE-CAPILLARY: 57 mg/dL — AB (ref 65–99)
GLUCOSE-CAPILLARY: 57 mg/dL — AB (ref 65–99)
Glucose-Capillary: 47 mg/dL — ABNORMAL LOW (ref 65–99)
Glucose-Capillary: 49 mg/dL — ABNORMAL LOW (ref 65–99)
Glucose-Capillary: 53 mg/dL — ABNORMAL LOW (ref 65–99)
Glucose-Capillary: 53 mg/dL — ABNORMAL LOW (ref 65–99)
Glucose-Capillary: 52 mg/dL — ABNORMAL LOW (ref 65–99)
Glucose-Capillary: 129 mg/dL — ABNORMAL HIGH (ref 65–99)

## 2017-05-12 LAB — CBC
HCT: 25.1 % — ABNORMAL LOW (ref 39.0–52.0)
Hemoglobin: 7.9 g/dL — ABNORMAL LOW (ref 13.0–17.0)
MCH: 25.7 pg — AB (ref 26.0–34.0)
MCHC: 31.5 g/dL (ref 30.0–36.0)
MCV: 81.8 fL (ref 78.0–100.0)
PLATELETS: 238 10*3/uL (ref 150–400)
RBC: 3.07 MIL/uL — ABNORMAL LOW (ref 4.22–5.81)
RDW: 13.4 % (ref 11.5–15.5)
WBC: 8.3 10*3/uL (ref 4.0–10.5)

## 2017-05-12 LAB — BASIC METABOLIC PANEL
Anion gap: 12 (ref 5–15)
BUN: 59 mg/dL — AB (ref 6–20)
CALCIUM: 8.1 mg/dL — AB (ref 8.9–10.3)
CHLORIDE: 117 mmol/L — AB (ref 101–111)
CO2: 17 mmol/L — ABNORMAL LOW (ref 22–32)
Creatinine, Ser: 3.31 mg/dL — ABNORMAL HIGH (ref 0.61–1.24)
GFR, EST AFRICAN AMERICAN: 21 mL/min — AB (ref >60)
GFR, EST NON AFRICAN AMERICAN: 18 mL/min — AB (ref >60)
Glucose, Bld: 147 mg/dL — ABNORMAL HIGH (ref 65–99)
Potassium: 3.5 mmol/L (ref 3.5–5.1)
Sodium: 146 mmol/L — ABNORMAL HIGH (ref 135–145)

## 2017-05-12 LAB — MAGNESIUM: MAGNESIUM: 2.3 mg/dL (ref 1.7–2.4)

## 2017-05-12 MED ORDER — DEXTROSE 50 % IV SOLN
1.0000 | Freq: Once | INTRAVENOUS | Status: AC
Start: 2017-05-12 — End: 2017-05-12
  Administered 2017-05-12: 50 mL via INTRAVENOUS
  Filled 2017-05-12 (×2): qty 50

## 2017-05-12 MED ORDER — INSULIN ASPART 100 UNIT/ML ~~LOC~~ SOLN
0.0000 [IU] | Freq: Three times a day (TID) | SUBCUTANEOUS | Status: DC
  Administered 2017-05-13 (×2): 2 [IU] via SUBCUTANEOUS

## 2017-05-12 MED ORDER — INSULIN ASPART 100 UNIT/ML ~~LOC~~ SOLN
0.0000 [IU] | Freq: Three times a day (TID) | SUBCUTANEOUS | Status: DC
Start: 2017-05-12 — End: 2017-05-12

## 2017-05-12 MED ORDER — FREE WATER
300.0000 mL | Freq: Four times a day (QID) | Status: DC
  Administered 2017-05-12 – 2017-05-13 (×5): 300 mL

## 2017-05-12 MED ORDER — INSULIN ASPART 100 UNIT/ML ~~LOC~~ SOLN: 6.0000 [IU] | Freq: Three times a day (TID) | SUBCUTANEOUS | Status: DC

## 2017-05-12 MED ORDER — INSULIN ASPART 100 UNIT/ML ~~LOC~~ SOLN: 0.0000 [IU] | Freq: Every day | SUBCUTANEOUS | Status: DC

## 2017-05-12 NOTE — Care Management Important Message (Signed)
Important Message  Patient Details  Name: Richard Bennett MRN: [id number] Date of Birth: [birth year]   Medicare Important Message Given:  Yes    [name] [name] 05/12/2017, 12:09 PM

## 2017-05-12 NOTE — Progress Notes (Signed)
Hypoglycemic 49 @ 1751, Pt had apple juice for 1 cup and other stuff, but blood sugar was not going up, stayed 50's. Dr. Thereasa Solo notified this matter and administered 1 ample of D50W then glucose went up to 124 now. HS [employer]

## 2017-05-12 NOTE — Progress Notes (Signed)
Nutrition Follow-up  DOCUMENTATION CODES:   Not applicable  INTERVENTION:   -Continue Magic Cup TID with meals -Continue MVI daily  NUTRITION DIAGNOSIS:   Increased nutrient needs related to wound healing as evidenced by estimated needs.  Ongoing  GOAL:   Patient will meet greater than or equal to 90% of their needs  Progressing  MONITOR:   PO intake, Supplement acceptance, Diet advancement, Labs, Weight trends, I & O's  REASON FOR ASSESSMENT:   Ventilator, Consult(verbal) Enteral/tube feeding initiation and management  ASSESSMENT:   67 yo male with PMH of HTN, HLD, Jehovah's witness, DM-2, stroke who was admitted on 2/8 with seizures, HTN emergency, nonketotic hyperglycemia. Required intubation on admission.  2/23- s/p BSE, recommended continued NPO status, transferred from ICU to SDU 2/26- PEG placed 2/27- NGT removed 3/1- s/p MBSS, recommend continue NPO status; rt basilic vein non-occlusive DVT found 3/4- TF held due to blood coming from PEG tube site and distended abdomen 3/8- s/p MBSS- advanced to dysphagia 1 diet with honey thick liquids; TF d/c  Reviewed I/O's: +3.2 L x 24 hours and +5.8 L since admission.   Spoke with pt at bedside, who responded to some close ended questions. He is happy that he is now able to eat and denies any difficulty with tolerating diet. He reports he did not eat breakfast this morning. Meal completion 25-75%.   Per CSW notes, plan for potential d/ to SNF today.   Labs reviewed: Na: 146, CBGS: 115-152 (inpatient orders for glycemic control are 0-15 units insulin aspart every 4 hours, 8 units insulin aspart TID with meals, and 18 units insulin glargine daily).   NUTRITION - FOCUSED PHYSICAL EXAM:    Most Recent Value  Orbital Region  No depletion  Upper Arm Region  No depletion  Thoracic and Lumbar Region  No depletion  Buccal Region  No depletion  Temple Region  No depletion  Clavicle Bone Region  No depletion  Clavicle and  Acromion Bone Region  No depletion  Scapular Bone Region  No depletion  Dorsal Hand  No depletion  Patellar Region  Mild depletion  Anterior Thigh Region  Mild depletion  Posterior Calf Region  Mild depletion  Edema (RD Assessment)  None  Hair  Reviewed  Eyes  Reviewed  Mouth  Reviewed  Skin  Reviewed  Nails  Reviewed       Diet Order:  Seizure precautions DIET - DYS 1 Room service appropriate? Yes; Fluid consistency: Honey Thick; Fluid restriction: Other (see comments)  EDUCATION NEEDS:   Not appropriate for education at this time  Skin:  Skin Assessment: Skin Integrity Issues: Skin Integrity Issues:: Stage II, Other (Comment) Stage II: sacrum Other: laceration to head  Last BM:  05/12/17 (via rectal tube)  Height:   Ht Readings from Last 1 Encounters:  04/11/17 5\' 8"  (1.727 m)    Weight:   Wt Readings from Last 1 Encounters:  05/04/17 160 lb 7.9 oz (72.8 kg)    Ideal Body Weight:  70 kg  BMI:  Body mass index is 24.4 kg/m.  Estimated Nutritional Needs:   Kcal:  1900-2100  Protein:  105-120 grams  Fluid:  1.9-2.1 L    Logen Heintzelman A. Jimmye Norman, RD, LDN, CDE Pager: [phone] After hours Pager: [phone]

## 2017-05-12 NOTE — Clinical Social Work Note (Addendum)
CSW continues to follow for discharge needs.  [place] Scrape, [place] [phone]  2:50 pm CSW called patient's brother to update on disposition status. CSW explained that SNF will need him or another relative to come in and complete paperwork prior to discharge to the facility. Patient upset by this, asking why he didn't have to do this in May of last year and what would happen if he was not available. CSW explained that this was just the message given from the facility. Patient's brother has the flu. CSW stated that per the facility, he can come into the facility to complete paperwork with a mask on. Patient's son replied by sarcastically stating, "Oh how nice of you. How sweet of you." CSW stated that facility would follow up with him on a time to complete paperwork. SNF clinical liaison notified.  Dayton Scrape, [place]

## 2017-05-12 NOTE — Progress Notes (Signed)
Physical Therapy Treatment Patient Details Name: Richard Bennett MRN: [id number] DOB: 1950-05-01 Today's Date: 05/12/2017    History of Present Illness Pt is a 67 y.o. male admitted 04/11/17 with acute encephalopathy and seizures; UDS positive for cocaine and benzos. Intub 2/8-2/22. Required CRRT for a brief period. MRI 2/8 showed no acute intracranial abnormality. MRI 2/13 showed new bilateral infarcts (L>R), thought to be watershed infarcts; per cardiology, likely secondary to bilateral carotid stenosis in setting of HTN emergency secondary to cocaine use. MRA 2/13 negative for L MCA branch occlusion. No new brain imaging since 2/13. PMH includes CVA (07/2016), DM, HTN.    PT Comments    Pt performed increased activity and able to speak and have conversation with therapist.  Pt would benefit from +3 assistance to attempt improved posturing in standing and blocking of R knee.  SNF remains appropriate for follow up as he continues to progress at a slower pace.    Follow Up Recommendations  SNF;Supervision/Assistance - 24 hour     Equipment Recommendations  (TBD at next venue)    Recommendations for Other Services       Precautions / Restrictions Precautions Precautions: Fall Precaution Comments: PEG, R-side inattention Restrictions Weight Bearing Restrictions: No    Mobility  Bed Mobility Overal bed mobility: Needs Assistance Bed Mobility: Rolling;Sidelying to Sit;Sit to Sidelying Rolling: Mod assist;Max assist(rolling to the R mod, rolling to the L max.  ) Sidelying to sit: Max assist     Sit to sidelying: Max assist;+2 for physical assistance General bed mobility comments: Pt required assistance with R side as he remains weak in UE/LE.  Pt performed rolling to R and L for pericare and pad placement.  Pt able to use rail with L hand to assist in sitting.    Transfers Overall transfer level: Needs assistance Equipment used: None;2 person hand held assist Transfers: Sit to/from  Stand Sit to Stand: Max assist;+2 physical assistance         General transfer comment: Required blocking on R knee.  RUE supported in standing.  Attempted to side step leading with RLE, required total assistance for R side stepping.  Performed sit to stand x4.    Ambulation/Gait                 Stairs            Wheelchair Mobility    Modified Rankin (Stroke Patients Only)       Balance Overall balance assessment: Needs assistance   Sitting balance-Leahy Scale: Fair Sitting balance - Comments: with LUE support on rail.       Standing balance-Leahy Scale: Zero                              Cognition Arousal/Alertness: Awake/alert Behavior During Therapy: Flat affect Overall Cognitive Status: Impaired/Different from baseline Area of Impairment: Orientation;Attention;Following commands;Safety/judgement;Awareness;Problem solving                 Orientation Level: Disoriented to;Time;Situation Current Attention Level: Sustained   Following Commands: Follows one step commands inconsistently;Follows one step commands with increased time;Follows multi-step commands inconsistently Safety/Judgement: Decreased awareness of deficits Awareness: Intellectual Problem Solving: Slow processing;Decreased initiation;Difficulty sequencing;Requires verbal cues;Requires tactile cues General Comments: Follows simple, one-step commands 50-75% of session. Required 5-30 seconds to respond to questions or directions at times, although answering most questions appropriately. Answers yes/no questions by responding "yes" to most, even if answer is clearly "no"  Exercises      General Comments        Pertinent Vitals/Pain Pain Assessment: No/denies pain Faces Pain Scale: Hurts little more Pain Location: Unsure. Grimaced with attempts to bring trunk forward. ?back vs abdomen Pain Descriptors / Indicators: Grimacing Pain Intervention(s): Monitored during  session;Repositioned    Home Living                      Prior Function            PT Goals (current goals can now be found in the care plan section) Acute Rehab PT Goals Patient Stated Goal: None stated Potential to Achieve Goals: Fair Progress towards PT goals: Progressing toward goals    Frequency    Min 3X/week      PT Plan Discharge plan needs to be updated    Co-evaluation              AM-PAC PT "6 Clicks" Daily Activity  Outcome Measure  Difficulty turning over in bed (including adjusting bedclothes, sheets and blankets)?: Unable Difficulty moving from lying on back to sitting on the side of the bed? : Unable Difficulty sitting down on and standing up from a chair with arms (e.g., wheelchair, bedside commode, etc,.)?: Unable Help needed moving to and from a bed to chair (including a wheelchair)?: A Lot Help needed walking in hospital room?: Total Help needed climbing 3-5 steps with a railing? : Total 6 Click Score: 7    End of Session Equipment Utilized During Treatment: Gait belt Activity Tolerance: Patient tolerated treatment well Patient left: in bed;with call bell/phone within reach;with bed alarm set;with nursing/sitter in room Nurse Communication: Mobility status;Need for lift equipment PT Visit Diagnosis: Other abnormalities of gait and mobility (R26.89);Other symptoms and signs involving the nervous system (R29.898);Hemiplegia and hemiparesis Hemiplegia - Right/Left: Right Hemiplegia - caused by: Cerebral infarction     Time: 9233-0076 PT Time Calculation (min) (ACUTE ONLY): 34 min  Charges:  $Therapeutic Activity: 23-37 mins                    G CodesGovernor Rooks, PTA pager [phone]    Cristela Blue 05/12/2017, 6:49 PM

## 2017-05-12 NOTE — Progress Notes (Signed)
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 EMS was called and the patient was given Versed.   Significant Events: 2/08Admit withacute encephalopathy, seizures,hyperglycemia, and HTN emergency - intubated 2/09 Started on Dilantin per Neurology  2/10Sedated - right hemiparesis > MRI - foley placed by Urology (urethral stricture) 2/14Hemodialysis 2/15 Tolerating CPAP/PS but not following commands 2/20Had to be placed back on vent support, still not following commands 2/22 Extubated   Subjective: Remains convsersant/interactive, though confused and lethargic.  Denies cp, sob, n/v, or abdom pain.    Assessment & Plan:  Abdom pain / PEG insertion site pain  Exam remains benign - tolerated resumption of  tube feeds w/o difficulty - now cleared for diet per SLP - TF stopped 3/10 - monitor intake w/o TF for now   FUO - Leukocytosis Only suspicious finding on exam was the PEG insertion tenderness - blood cx negative x2 - broad spectrum abx tx to continue for 7 days - now afebrile w/ normal WBC - no clear sight of infection found, but is clinically much improved    Anemia No sign of signif bleeding at present, but Hgb was trending down in a pt who will not allow blood transfusions - stopped anticoag - f/u in AM w/ volume expansion   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 - R side flaccid  likely secondary to bilateral carotid stenosis in the setting of episodic hypotension with hypertensive emergency secondary to cocaine use - has markedly improved in regard to alertness and interaction - nonetheless, continues to need lifelong SNF placement     B carotid stenosis  carotid doppler this admit noted left CCA > 50%, left ICA 60-79%, and right ICA 40-59% stenosis - previously noted as early as 07/2106 on MRI  HTN must exercise care to avoid overcorrection in this setting - SBP goal is 140-160 in setting of B carotid stenosis and watershed infarcts - BP presently at goal   R basilic vein non-occlusive DVT Noted on venous duplex 05/02/17 - I find no evidence of a prior PICC in the R UE during this hospital stay - have been anticoagulating w/ heparin, but w/ Hgb dropping in patient who will not allow transfusion anticoag was stopped   Dysphagia SLP following - pt now on modified diet   Acute hypoxic respiratory failure in setting of seizure activity  Resolved - sats 100% on RA    Acute encephalopathy Has markedly improved over last 3-5 days- suspect he suffered significant brain injury due to his seizure and strokes - minimized all sedating meds in a stepwise fashion  Acute kidney failure  Due to above - crt still climbing - appears volume depelted - volume expand today w/ IVF and PEG free water - recheck crt in AM   Recent Labs  Lab 05/07/17 0223 05/08/17 0843 05/10/17 0551 05/11/17 1054 05/12/17 0243  CREATININE 2.41* 2.90* 2.84* 3.12* 3.31*    Hypernatremia Due to [facility] - recurring - increase free water via PEG - increase IVF   Seizures controlled for now   Cocaine abuse  UDS positive for cocaine and benzos (given by EMS) at time of admit  DM2 A1c 11.8 04/17/17 - CBG controlled   DVT prophylaxis: SCDs Code Status: FULL CODE Family Communication: no family present at time of exam  Disposition Plan: med/surg - not yet ready for SNF due to worsening crt - possible SNF 3/12 if renal fxn improved   Consultants:  Neurology  PCCM  Antimicrobials:  None    Objective: Blood pressure (!) 155/78, pulse 72, temperature 97.6 F (36.4 C), temperature source Oral, resp. rate (!) 22, height 5\' 8"  (1.727 m), weight  72.8 kg (160 lb 7.9 oz), SpO2 99 %.  Intake/Output Summary (Last 24 hours) at 05/12/2017 0844 Last data filed at 05/12/2017 0757 Gross per 24 hour  Intake 3485.83 ml  Output 400 ml  Net 3085.83 ml   Filed Weights   05/02/17 0330 05/03/17 0340 05/04/17 0615  Weight: 72.5 kg (159 lb 13.3 oz) 73.4 kg (161 lb 13.1 oz) 72.8 kg (160 lb 7.9 oz)    Examination: General: No acute respiratory distress - conversant though lethargic  Lungs: CTA B Cardiovascular: RRR w/o M or rub  Abdomen: non-distended - thin - no evidence of pain w/ palpation around PEG insertion site - bs+ Extremities: No edema B LE   CBC: Recent Labs  Lab 05/08/17 1137 05/09/17 1206 05/10/17 0551 05/11/17 1054 05/12/17 0243  WBC 11.9* 9.9 7.9 7.7 8.3  NEUTROABS 9.7*  --   --   --   --   HGB 7.5* 7.4* 7.6* 7.4* 7.9*  HCT 23.3* 23.2* 23.2* 23.5* 25.1*  MCV 82.9 80.8 82.6 82.2 81.8  PLT 203 202 211 228 295   Basic Metabolic Panel: Recent Labs  Lab 05/07/17 0223 05/08/17 0843 05/10/17 0551 05/11/17 1054 05/12/17 0243  NA 140 140 141 142 146*  K 5.0 4.5 4.1 3.5 3.5  CL 109 110 111 113* 117*  CO2 19* 16* 17* 19* 17*  GLUCOSE 178* 250* 250* 434* 147*  BUN 55* 62* 58* 60* 59*  CREATININE 2.41* 2.90* 2.84* 3.12* 3.31*  CALCIUM 8.2* 8.0* 8.0* 7.8* 8.1*  MG  --  2.4  --  2.3 2.3   GFR: Estimated Creatinine Clearance: 21.2 mL/min (A) (by C-G formula based on SCr of 3.31 mg/dL (H)).  Liver Function Tests: Recent Labs  Lab 05/06/17 0737 05/07/17 0223 05/08/17 0843 05/10/17 0551  AST 35 43* 47* 74*  ALT 25 26 26  37  ALKPHOS 205* 218* 180* 200*  BILITOT 0.5 0.8 0.5 0.5  PROT 6.0* 6.2* 5.2* 5.9*  ALBUMIN 1.9* 1.8* 1.6* 1.8*    HbA1C: Hgb A1c MFr Bld  Date/Time Value Ref Range Status  05/11/2017 11:16 AM 10.6 (H) 4.8 - 5.6 % Final    Comment:    (NOTE) Pre diabetes:          5.7%-6.4% Diabetes:              >6.4% Glycemic control for   <7.0% adults with diabetes   04/17/2017 08:30 AM 11.8 (H) 4.8  - 5.6 % Final    Comment:    (NOTE) Pre diabetes:          5.7%-6.4% Diabetes:              >6.4% Glycemic control for   <7.0% adults with diabetes     CBG: Recent Labs  Lab 05/11/17 1623 05/11/17 1954 05/11/17 2340 05/12/17 0521 05/12/17 0756  GLUCAP 278* 148* 152* 130* 115*     Scheduled Meds: . aspirin  325 mg Per Tube Daily  . atorvastatin  40  mg Per Tube q1800  . chlorhexidine  15 mL Mouth/Throat BID  . cloNIDine  0.2 mg Per Tube BID  . folic acid  1 mg Per Tube Daily  . free water  200 mL Per Tube Q8H  . insulin aspart  0-15 Units Subcutaneous Q4H  . insulin aspart  8 Units Subcutaneous TID WC  . insulin glargine  18 Units Subcutaneous Daily  . labetalol  200 mg Per Tube Q8H  . levETIRAcetam  500 mg Per Tube BID  . multivitamin with minerals  1 tablet Oral Daily  . thiamine  100 mg Per Tube Daily     LOS: 31 days   [name], MD [facility] Office  [phone] Pager - Text Page per Amion as per below:  On-Call/Text Page:      [email or web address]      password [email or web address]  If 7PM-7AM, please contact night-coverage [email or web address] Password [facility] 05/12/2017, 8:43 AM

## 2017-05-13 LAB — CBC
HEMATOCRIT: 24.3 % — AB (ref 39.0–52.0)
Hemoglobin: 7.8 g/dL — ABNORMAL LOW (ref 13.0–17.0)
MCH: 26.4 pg (ref 26.0–34.0)
MCHC: 32.1 g/dL (ref 30.0–36.0)
MCV: 82.4 fL (ref 78.0–100.0)
PLATELETS: 255 10*3/uL (ref 150–400)
RBC: 2.95 MIL/uL — AB (ref 4.22–5.81)
RDW: 13.9 % (ref 11.5–15.5)
WBC: 8.2 10*3/uL (ref 4.0–10.5)

## 2017-05-13 LAB — BASIC METABOLIC PANEL
Anion gap: 11 (ref 5–15)
BUN: 49 mg/dL — ABNORMAL HIGH (ref 6–20)
CHLORIDE: 118 mmol/L — AB (ref 101–111)
CO2: 15 mmol/L — ABNORMAL LOW (ref 22–32)
Calcium: 7.8 mg/dL — ABNORMAL LOW (ref 8.9–10.3)
Creatinine, Ser: 2.88 mg/dL — ABNORMAL HIGH (ref 0.61–1.24)
GFR calc non Af Amer: 21 mL/min — ABNORMAL LOW (ref >60)
GFR, EST AFRICAN AMERICAN: 25 mL/min — AB (ref >60)
Glucose, Bld: 172 mg/dL — ABNORMAL HIGH (ref 65–99)
POTASSIUM: 3.8 mmol/L (ref 3.5–5.1)
SODIUM: 144 mmol/L (ref 135–145)

## 2017-05-13 LAB — GLUCOSE, CAPILLARY
GLUCOSE-CAPILLARY: 149 mg/dL — AB (ref 65–99)
Glucose-Capillary: 182 mg/dL — ABNORMAL HIGH (ref 65–99)
Glucose-Capillary: 173 mg/dL — ABNORMAL HIGH (ref 65–99)
Glucose-Capillary: 187 mg/dL — ABNORMAL HIGH (ref 65–99)

## 2017-05-13 MED ORDER — ADULT MULTIVITAMIN W/MINERALS CH: 1.0000 | ORAL_TABLET | Freq: Every day | ORAL | Status: DC

## 2017-05-13 MED ORDER — ATORVASTATIN CALCIUM 40 MG PO TABS: 40.0000 mg | ORAL_TABLET | Freq: Every day | ORAL | Status: DC

## 2017-05-13 MED ORDER — ASPIRIN 325 MG PO TABS: 325.0000 mg | ORAL_TABLET | Freq: Every day | ORAL | Status: DC

## 2017-05-13 MED ORDER — ACETAMINOPHEN 160 MG/5ML PO SOLN: 650.0000 mg | Freq: Four times a day (QID) | ORAL | 0 refills | Status: DC | PRN

## 2017-05-13 MED ORDER — LABETALOL HCL 200 MG PO TABS: 200.0000 mg | ORAL_TABLET | Freq: Three times a day (TID) | ORAL | Status: DC

## 2017-05-13 MED ORDER — LEVETIRACETAM 100 MG/ML PO SOLN: 500.0000 mg | Freq: Two times a day (BID) | ORAL | 12 refills | Status: DC

## 2017-05-13 MED ORDER — INSULIN GLARGINE 100 UNIT/ML ~~LOC~~ SOLN: 18.0000 [IU] | Freq: Every day | SUBCUTANEOUS | 11 refills | Status: DC

## 2017-05-13 MED ORDER — CLONIDINE HCL 0.2 MG PO TABS: 0.2000 mg | ORAL_TABLET | Freq: Three times a day (TID) | ORAL | Status: DC

## 2017-05-13 MED ORDER — ALUM & MAG HYDROXIDE-SIMETH 200-200-20 MG/5ML PO SUSP: 15.0000 mL | ORAL | 0 refills | Status: DC | PRN

## 2017-05-13 MED ORDER — FOLIC ACID 1 MG PO TABS: 1.0000 mg | ORAL_TABLET | Freq: Every day | ORAL | Status: DC

## 2017-05-13 MED ORDER — POLYETHYLENE GLYCOL 3350 17 G PO PACK: 17.0000 g | PACK | Freq: Every day | ORAL | 0 refills | Status: DC | PRN

## 2017-05-13 MED ORDER — INSULIN ASPART 100 UNIT/ML ~~LOC~~ SOLN: 0.0000 [IU] | Freq: Every day | SUBCUTANEOUS | 11 refills | Status: DC

## 2017-05-13 MED ORDER — THIAMINE HCL 100 MG PO TABS: 100.0000 mg | ORAL_TABLET | Freq: Every day | ORAL | Status: DC

## 2017-05-13 MED ORDER — DOCUSATE SODIUM 50 MG/5ML PO LIQD: 100.0000 mg | Freq: Two times a day (BID) | ORAL | 0 refills | Status: DC | PRN

## 2017-05-13 MED ORDER — FREE WATER: 400.0000 mL | Freq: Four times a day (QID) | Status: DC

## 2017-05-13 MED ORDER — INSULIN ASPART 100 UNIT/ML ~~LOC~~ SOLN: 0.0000 [IU] | Freq: Three times a day (TID) | SUBCUTANEOUS | 11 refills | Status: DC

## 2017-05-13 MED ORDER — BISACODYL 10 MG RE SUPP: 10.0000 mg | Freq: Every day | RECTAL | 0 refills | Status: DC | PRN

## 2017-05-13 NOTE — Clinical Social Work Note (Signed)
Director of social work department has agreed to complete patient's SNF admissions paperwork until family's flu has resolved and they can come in to complete it themselves. Clinical liaison for the facility notified patient's brother and he is agreeable to this.   Richard Bennett, [place]

## 2017-05-13 NOTE — Progress Notes (Signed)
Report given to [place]. Iv's removed; belongings collected; reviewed d/c instructions [place]; will continue to monitor.   [place]  Laylamarie Meuser, RN

## 2017-05-13 NOTE — Progress Notes (Signed)
Physical Therapy Treatment Patient Details Name: Richard Bennett MRN: [id number] DOB: Apr 07, 1950 Today's Date: 05/13/2017    History of Present Illness Pt is a 67 y.o. male admitted 04/11/17 with acute encephalopathy and seizures; UDS positive for cocaine and benzos. Intub 2/8-2/22. Required CRRT for a brief period. MRI 2/8 showed no acute intracranial abnormality. MRI 2/13 showed new bilateral infarcts (L>R), thought to be watershed infarcts; per cardiology, likely secondary to bilateral carotid stenosis in setting of HTN emergency secondary to cocaine use. MRA 2/13 negative for L MCA branch occlusion. No new brain imaging since 2/13. PMH includes CVA (07/2016), DM, HTN.    PT Comments    PTA passing by patient's room and noticed chair alarm sounding.  Pt had rolled to his right and slid his hips forward onto the edge of the recliner chair.  Unable to lower the recliner and transfer back to bed so PTA and charge nurse use maximove lift equipment to transfer from chair back to bed.  Pt appears more restless after sitting in the chair x2 hours.  Pt required assistance for rolling and positioning post transfer to remove pad and improve position in bed.   Follow Up Recommendations  SNF;Supervision/Assistance - 24 hour     Equipment Recommendations  (TBD at next venue)    Recommendations for Other Services OT consult     Precautions / Restrictions Precautions Precautions: Fall Precaution Comments: PEG, R-side inattention, dense hemiplegia Restrictions Weight Bearing Restrictions: No    Mobility  Bed Mobility Overal bed mobility: Needs Assistance Bed Mobility: Rolling(to remove maximove pad after placed back to bed with maximove.  ) Mod assistance to roll R and L.         Transfers Overall transfer level: Needs assistance           General transfer comment: RN and PT positioned patient and placed maximove sling for transfer back to bed.  Pt appears restless and had slid his  bottom to the foot rest of the chair.  Pt was not safe to stand from this position so maximove was used.    Ambulation/Gait                 Stairs            Wheelchair Mobility    Modified Rankin (Stroke Patients Only) Modified Rankin (Stroke Patients Only) Pre-Morbid Rankin Score: No symptoms Modified Rankin: Severe disability     Balance                               Cognition Arousal/Alertness: Awake/alert Behavior During Therapy: Flat affect Overall Cognitive Status: Impaired/Different from baseline Area of Impairment: Orientation;Attention;Following commands;Awareness;Problem solving                   Current Attention Level: Sustained   Following Commands: Follows one step commands inconsistently;Follows one step commands with increased time;Follows multi-step commands inconsistently Safety/Judgement: Decreased awareness of deficits Awareness: Intellectual Problem Solving: Slow processing;Decreased initiation;Difficulty sequencing;Requires verbal cues;Requires tactile cues General Comments: unreliable yes/no responses      Exercises      General Comments        Pertinent Vitals/Pain Pain Assessment: Faces Faces Pain Scale: No hurt    Home Living                      Prior Function            PT  Goals (current goals can now be found in the care plan section) Acute Rehab PT Goals Patient Stated Goal: None stated Potential to Achieve Goals: Fair Progress towards PT goals: Progressing toward goals    Frequency    Min 3X/week      PT Plan Current plan remains appropriate       AM-PAC PT "6 Clicks" Daily Activity  Outcome Measure  Difficulty turning over in bed (including adjusting bedclothes, sheets and blankets)?: Unable Difficulty moving from lying on back to sitting on the side of the bed? : Unable Difficulty sitting down on and standing up from a chair with arms (e.g., wheelchair, bedside  commode, etc,.)?: Unable Help needed moving to and from a bed to chair (including a wheelchair)?: Total Help needed walking in hospital room?: Total Help needed climbing 3-5 steps with a railing? : Total 6 Click Score: 6    End of Session Equipment Utilized During Treatment: Gait belt Activity Tolerance: Patient tolerated treatment well Patient left: in bed;with call bell/phone within reach;with bed alarm set;with nursing/sitter in room Nurse Communication: Mobility status;Need for lift equipment PT Visit Diagnosis: Other abnormalities of gait and mobility (R26.89);Other symptoms and signs involving the nervous system (R29.898);Hemiplegia and hemiparesis Hemiplegia - Right/Left: Right Hemiplegia - caused by: Cerebral infarction     Time: 1405-1420 PT Time Calculation (min) (ACUTE ONLY): 15 min  Charges:  $Therapeutic Activity: 8-22 mins                    G CodesGovernor Rooks, PTA pager [phone]    Cristela Blue 05/13/2017, 6:17 PM

## 2017-05-13 NOTE — Evaluation (Signed)
Occupational Therapy Evaluation Patient Details Name: Richard Bennett MRN: [id number] DOB: 30-Oct-1950 Today's Date: 05/13/2017    History of Present Illness Pt is a 67 y.o. male admitted 04/11/17 with acute encephalopathy and seizures; UDS positive for cocaine and benzos. Intub 2/8-2/22. Required CRRT for a brief period. MRI 2/8 showed no acute intracranial abnormality. MRI 2/13 showed new bilateral infarcts (L>R), thought to be watershed infarcts; per cardiology, likely secondary to bilateral carotid stenosis in setting of HTN emergency secondary to cocaine use. MRA 2/13 negative for L MCA branch occlusion. No new brain imaging since 2/13. PMH includes CVA (07/2016), DM, HTN.   Clinical Impression   Pt is typically independent and lives alone. Presents with dense R hemiplegia, R inattention, impaired cognition generalized weakness. Pt requires min to total assist for ADL and 2 person assist for OOB transfers. Pt will need extensive rehab upon discharge. Will follow acutely.    Follow Up Recommendations  SNF;Supervision/Assistance - 24 hour    Equipment Recommendations       Recommendations for Other Services       Precautions / Restrictions Precautions Precautions: Fall Precaution Comments: PEG, R-side inattention, dense hemiplegia Restrictions Weight Bearing Restrictions: No      Mobility Bed Mobility Overal bed mobility: Needs Assistance Bed Mobility: Rolling;Sidelying to Sit Rolling: Min assist;Mod assist(min to R, mod to L) Sidelying to sit: Mod assist       General bed mobility comments: assist for LEs over EOB and to raise trunk, increased time and heavy reliance on R UE  Transfers Overall transfer level: Needs assistance   Transfers: Squat Pivot Transfers     Squat pivot transfers: +2 physical assistance;Total assist     General transfer comment: cues to reach for opposite arm of chair, minimal weight bearing through L LE    Balance Overall balance assessment:  Needs assistance Sitting-balance support: Feet supported Sitting balance-Leahy Scale: Fair Sitting balance - Comments: intermittent propping on L UE or with rail, statically     Standing balance-Leahy Scale: Zero                             ADL either performed or assessed with clinical judgement   ADL Overall ADL's : Needs assistance/impaired Eating/Feeding: Sitting;Supervision/ safety Eating/Feeding Details (indicate cue type and reason): drinking only observed Grooming: Moderate assistance;Sitting   Upper Body Bathing: Sitting;Total assistance   Lower Body Bathing: Total assistance;Bed level   Upper Body Dressing : Sitting;Total assistance   Lower Body Dressing: Total assistance;Bed level   Toilet Transfer: +2 for physical assistance;Total assistance;Squat-pivot Toilet Transfer Details (indicate cue type and reason): simulated to chair Toileting- Clothing Manipulation and Hygiene: Total assistance;Bed level               Vision         Perception Perception Perception Tested?: Yes Perception Deficits: Inattention/neglect Inattention/Neglect: Does not attend to right visual field;Does not attend to right side of body   Praxis      Pertinent Vitals/Pain Pain Assessment: Faces Faces Pain Scale: No hurt     Hand Dominance Right   Extremity/Trunk Assessment Upper Extremity Assessment Upper Extremity Assessment: RUE deficits/detail RUE Deficits / Details: Full PROM, mild increased tone in elbow, edematous, no active movement RUE Sensation: decreased light touch;decreased proprioception RUE Coordination: decreased fine motor;decreased gross motor   Lower Extremity Assessment Lower Extremity Assessment: Defer to PT evaluation       Communication Communication Communication: Expressive difficulties;Receptive  difficulties   Cognition Arousal/Alertness: Awake/alert Behavior During Therapy: Flat affect Overall Cognitive Status:  Impaired/Different from baseline Area of Impairment: Orientation;Attention;Following commands;Awareness;Problem solving                 Orientation Level: Disoriented to;Time;Situation Current Attention Level: Sustained   Following Commands: Follows one step commands inconsistently;Follows one step commands with increased time;Follows multi-step commands inconsistently Safety/Judgement: Decreased awareness of deficits Awareness: Intellectual Problem Solving: Slow processing;Decreased initiation;Difficulty sequencing;Requires verbal cues;Requires tactile cues General Comments: unreliable yes/no responses   General Comments       Exercises     Shoulder Instructions      Home Living Family/patient expects to be discharged to:: Unsure Living Arrangements: Alone Available Help at Discharge: Family;Available PRN/intermittently Type of Home: Apartment                              Lives With: Alone    Prior Functioning/Environment Level of Independence: Independent                 OT Problem List: Decreased strength;Decreased activity tolerance;Impaired balance (sitting and/or standing);Decreased coordination;Decreased cognition;Impaired UE functional use      OT Treatment/Interventions: Self-care/ADL training;DME and/or AE instruction;Neuromuscular education;Visual/perceptual remediation/compensation;Balance training;Patient/family education;Therapeutic activities    OT Goals(Current goals can be found in the care plan section) Acute Rehab OT Goals Patient Stated Goal: None stated OT Goal Formulation: Patient unable to participate in goal setting Time For Goal Achievement: 05/27/17 Potential to Achieve Goals: Good ADL Goals Pt Will Perform Eating: with supervision;sitting Pt Will Perform Grooming: with min assist;sitting Additional ADL Goal #1: Pt will locate visual targets L of midline with minimal verbal cues. Additional ADL Goal #2: Pt will perform  bed mobility with min assist in preparation for ADL at EOB. Additional ADL Goal #3: Pt will locate R UE with his L with moderate assistance to protect from injury and position.  OT Frequency: Min 2X/week   Barriers to D/C:            Co-evaluation PT/OT/SLP Co-Evaluation/Treatment: Yes Reason for Co-Treatment: For patient/therapist safety   OT goals addressed during session: ADL's and self-care      AM-PAC PT "6 Clicks" Daily Activity     Outcome Measure Help from another person eating meals?: A Little Help from another person taking care of personal grooming?: A Lot Help from another person toileting, which includes using toliet, bedpan, or urinal?: Total Help from another person bathing (including washing, rinsing, drying)?: Total Help from another person to put on and taking off regular upper body clothing?: Total Help from another person to put on and taking off regular lower body clothing?: Total 6 Click Score: 9   End of Session Equipment Utilized During Treatment: Gait belt  Activity Tolerance: Patient tolerated treatment well Patient left: in chair;with call bell/phone within reach;with chair alarm set  OT Visit Diagnosis: Unsteadiness on feet (R26.81);Muscle weakness (generalized) (M62.81);Hemiplegia and hemiparesis;Cognitive communication deficit (R41.841) Symptoms and signs involving cognitive functions: Cerebral infarction Hemiplegia - Right/Left: Right Hemiplegia - caused by: Cerebral infarction                Time: 6568-1275 OT Time Calculation (min): 31 min Charges:  OT General Charges $OT Visit: 1 Visit OT Evaluation $OT Eval Moderate Complexity: 1 Mod G-Codes:     06/02/2017 Nestor Lewandowsky, OTR/L Pager: [phone]  Werner Lean Haze Boyden 06/02/17, 12:25 PM

## 2017-05-13 NOTE — Clinical Social Work Placement (Signed)
   CLINICAL SOCIAL WORK PLACEMENT  NOTE  Date:  05/13/2017  Patient Details  Name: Richard Bennett MRN: [id number] Date of Birth: August 13, 1950  Clinical Social Work is seeking post-discharge placement for this patient at the [place] level of care (*CSW will initial, date and re-position this form in  chart as items are completed):  Yes   Patient/family provided with [place] Work Department's list of facilities offering this level of care within the geographic area requested by the patient (or if unable, by the patient's family).  Yes   Patient/family informed of their freedom to choose among providers that offer the needed level of care, that participate in Medicare, Medicaid or managed care program needed by the patient, have an available bed and are willing to accept the patient.  Yes   Patient/family informed of [facility]'s ownership interest in [facility] and [facility], as well as of the fact that they are under no obligation to receive care at these facilities.  PASRR submitted to EDS on 04/28/17     PASRR number received on       Existing PASRR number confirmed on 04/28/17     FL2 transmitted to all facilities in geographic area requested by pt/family on 04/28/17     FL2 transmitted to all facilities within larger geographic area on       Patient informed that his/her managed care company has contracts with or will negotiate with certain facilities, including the following:        Yes   Patient/family informed of bed offers received.  Patient chooses bed at [facility]     Physician recommends and patient chooses bed at      Patient to be transferred to [facility] on 05/13/17.  Patient to be transferred to facility by [facility]     Patient family notified on 05/13/17 of transfer.  Name of family member notified:  Rutha Bouchard      PHYSICIAN Please prepare prescriptions     Additional Comment:    _______________________________________________ Candie Chroman, LCSW 05/13/2017, 3:26 PM

## 2017-05-13 NOTE — Discharge Summary (Signed)
DISCHARGE SUMMARY  Richard Bennett  MR#: [id number]  DOB:12-Jun-1950  Date of Admission: 04/11/2017 Date of Discharge: 05/13/2017  Attending Physician:Mel Langan Hennie Duos, MD  Patient's EXB:MWUXLKG, Richard Grief, MD  Consults: Neurology  PCCM  Disposition: D/C to SNF   Follow-up Appts:  Contact information for follow-up providers    [name], MD. Schedule an appointment as soon as possible for a visit in 6 week(s).   Specialties:  Neurology, Radiology Contact information: [address] [place] [address] [phone]            Contact information for after-discharge care    [facility] .   Service:  Skilled Nursing Contact information: 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 EMS was called and the patient was given Versed.   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  likely secondary to bilateral carotid stenosis in the setting of episodic hypotension with hypertensive emergency secondary to cocaine use - has markedly improved in regard to alertness and interaction - nonetheless, continues to need lifelong SNF placement   B carotid stenosis  carotid doppler this admit noted left CCA > 50%, left ICA 60-79%, and right ICA 40-59% stenosis - previously noted as early as 07/2106 on MRI  HTN must exercise care to avoid overcorrection in this setting - SBP goal is 140-160 in setting of B carotid stenosis and watershed infarcts - BP remains variable and requires frequent titration of medical tx   Abdom pain / PEG insertion site pain  Exam remains benign - tolerated resumption of  tube feeds w/o difficulty - has also been cleared for diet per SLP - TF stopped 3/10 - monitor intake w/o TF for now - cont free water via tube to correct hypernatremia   FUO - Leukocytosis - resolved  Only suspicious finding on exam was the PEG insertion tenderness - blood cx negative x2 - broad spectrum abx continued for 7 days - afebrile w/ normal WBC at time of d/c - no clear source of infection found, but is clinically much improved    Anemia of CKD as well as poor nutrition  No sign of signif bleeding at time of d/c - Hgb was trending  down in a pt who will not allow blood transfusions - stopped anticoag - re-check Hgb intermittently   R basilic vein non-occlusive DVT Noted on venous duplex 05/02/17 - pt did not have a PICC in the R UE during this hospital stay - had been anticoagulating w/ heparin, but w/ Hgb dropping in patient who will not allow transfusion anticoag  was stopped - no apparent complications at time of d/c - follow on serial exam   Dysphagia SLP following - pt now on modified diet (Dysphagia 1 - honey thick liquids) - cont to assess for possible improvement in future   Acute hypoxic respiratory failure in setting of seizure activity  Resolved - sats 100% on RA   Acute encephalopathy Has markedly improved over last 3-5 days of hospital stay -  suspect he suffered significant brain injury due to his seizure and strokes - minimize all sedating meds   Acute kidney failure > new CKD Creatinine appears to be stabilizing around 2.8 - crt is very sensitive to changes in volume status - assure pt remains hydrated w/ free water flushes via his PEG  Hypernatremia Due to [facility] - corrected w/ free water via PEG  Seizures controlled    Cocaine abuse UDS positive for cocaine and benzos (given by EMS) at time of admit   DM2 A1c 11.8 04/17/17 - CBG controlled   Allergies as of 05/13/2017   No Known Allergies     Medication List    STOP taking these medications   ferrous sulfate 325 (65 FE) MG tablet   hydrochlorothiazide 12.5 MG capsule Commonly known as:  MICROZIDE   LANTUS SOLOSTAR 100 UNIT/ML Solostar Pen Generic drug:  Insulin Glargine Replaced by:  insulin glargine 100 UNIT/ML injection   losartan 50 MG tablet Commonly known as:  COZAAR   metoprolol tartrate 25 MG tablet Commonly known as:  LOPRESSOR   sodium bicarbonate 650 MG tablet   VIBERZI 100 MG Tabs Generic drug:  Eluxadoline     TAKE these medications   acetaminophen 160 MG/5ML solution Commonly known as:  TYLENOL Place 20.3 mLs (650 mg total) into feeding tube every 6 (six) hours as needed for mild pain or fever.   alum & mag hydroxide-simeth 200-200-20 MG/5ML suspension Commonly known as:  MAALOX/MYLANTA Take 15 mLs by mouth every 4 (four) hours as needed for indigestion or heartburn.   amLODipine 10 MG tablet Commonly known as:  NORVASC Take 10 mg  by mouth daily.   aspirin 325 MG tablet Place 1 tablet (325 mg total) into feeding tube daily. Start taking on:  05/14/2017 What changed:  how to take this   atorvastatin 40 MG tablet Commonly known as:  LIPITOR Place 1 tablet (40 mg total) into feeding tube daily at 6 PM. What changed:    medication strength  how much to take  how to take this   bisacodyl 10 MG suppository Commonly known as:  DULCOLAX Place 1 suppository (10 mg total) rectally daily as needed for moderate constipation.   cloNIDine 0.2 MG tablet Commonly known as:  CATAPRES Place 1 tablet (0.2 mg total) into feeding tube 3 (three) times daily. What changed:    medication strength  how much to take  how to take this  when to take this   docusate 50 MG/5ML liquid Commonly known as:  COLACE Place 10 mLs (100 mg total) into feeding tube 2 (two) times daily as needed for mild constipation.   folic acid 1 MG tablet Commonly  known as:  FOLVITE Place 1 tablet (1 mg total) into feeding tube daily. Start taking on:  05/14/2017   free water Soln Place 400 mLs into feeding tube every 6 (six) hours.   insulin aspart 100 UNIT/ML injection Commonly known as:  novoLOG Inject 0-5 Units into the skin at bedtime.   insulin aspart 100 UNIT/ML injection Commonly known as:  novoLOG Inject 0-9 Units into the skin 3 (three) times daily with meals.   insulin glargine 100 UNIT/ML injection Commonly known as:  LANTUS Inject 0.18 mLs (18 Units total) into the skin daily. Start taking on:  05/14/2017 Replaces:  LANTUS SOLOSTAR 100 UNIT/ML Solostar Pen   labetalol 200 MG tablet Commonly known as:  NORMODYNE Place 1 tablet (200 mg total) into feeding tube every 8 (eight) hours.   levETIRAcetam 100 MG/ML solution Commonly known as:  KEPPRA Place 5 mLs (500 mg total) into feeding tube 2 (two) times daily.   multivitamin with minerals Tabs tablet Take 1 tablet by mouth daily. Start taking on:  05/14/2017     polyethylene glycol packet Commonly known as:  MIRALAX / GLYCOLAX Place 17 g into feeding tube daily as needed for severe constipation.   thiamine 100 MG tablet Place 1 tablet (100 mg total) into feeding tube daily. Start taking on:  05/14/2017       Day of Discharge BP (!) 188/103 (BP Location: Left Arm)   Pulse 76   Temp 98 F (36.7 C) (Oral)   Resp 18   Ht 5\' 8"  (1.727 m)   Wt 72.8 kg (160 lb 7.9 oz)   SpO2 100%   BMI 24.40 kg/m   Physical Exam: General: No acute respiratory distress Lungs: Clear to auscultation bilaterally without wheezes or crackles Cardiovascular: Regular rate and rhythm without murmur gallop or rub normal S1 and S2 Abdomen: Nontender, nondistended, soft, bowel sounds positive, PEG insertion site clean and dry  Extremities: No significant cyanosis, clubbing, or edema bilateral lower extremities  Basic Metabolic Panel: Recent Labs  Lab 05/08/17 0843 05/10/17 0551 05/11/17 1054 05/12/17 0243 05/13/17 0227  NA 140 141 142 146* 144  K 4.5 4.1 3.5 3.5 3.8  CL 110 111 113* 117* 118*  CO2 16* 17* 19* 17* 15*  GLUCOSE 250* 250* 434* 147* 172*  BUN 62* 58* 60* 59* 49*  CREATININE 2.90* 2.84* 3.12* 3.31* 2.88*  CALCIUM 8.0* 8.0* 7.8* 8.1* 7.8*  MG 2.4  --  2.3 2.3  --     Liver Function Tests: Recent Labs  Lab 05/07/17 0223 05/08/17 0843 05/10/17 0551  AST 43* 47* 74*  ALT 26 26 37  ALKPHOS 218* 180* 200*  BILITOT 0.8 0.5 0.5  PROT 6.2* 5.2* 5.9*  ALBUMIN 1.8* 1.6* 1.8*    CBC: Recent Labs  Lab 05/08/17 1137 05/09/17 1206 05/10/17 0551 05/11/17 1054 05/12/17 0243 05/13/17 0227  WBC 11.9* 9.9 7.9 7.7 8.3 8.2  NEUTROABS 9.7*  --   --   --   --   --   HGB 7.5* 7.4* 7.6* 7.4* 7.9* 7.8*  HCT 23.3* 23.2* 23.2* 23.5* 25.1* 24.3*  MCV 82.9 80.8 82.6 82.2 81.8 82.4  PLT 203 202 211 228 238 255    CBG: Recent Labs  Lab 05/12/17 1940 05/12/17 2353 05/13/17 0346 05/13/17 0829 05/13/17 1223  GLUCAP 129* 182* 149* 187* 173*     Recent Results (from the past 240 hour(s))  Culture, blood (routine x 2)     Status: None   Collection Time:  05/06/17  7:11 PM  Result Value Ref Range Status   Specimen Description BLOOD LEFT HAND  Final   Special Requests IN PEDIATRIC BOTTLE Blood Culture adequate volume  Final   Culture   Final    NO GROWTH 5 DAYS Performed at [facility], [place] [address], [place], [place] [address]    Report Status 05/11/2017 FINAL  Final  Culture, blood (routine x 2)     Status: None   Collection Time: 05/06/17  7:12 PM  Result Value Ref Range Status   Specimen Description BLOOD LEFT HAND  Final   Special Requests IN PEDIATRIC BOTTLE Blood Culture adequate volume  Final   Culture   Final    NO GROWTH 5 DAYS Performed at [facility], [place] [address] [place], [place] [address]    Report Status 05/11/2017 FINAL  Final      Time spent in discharge (includes decision making & examination of pt): 35 minutes  05/13/2017, 2:16 PM   [name], MD [facility] Office  [phone] Pager [phone]  On-Call/Text Page:      [email or web address]      password [facility]

## 2017-05-13 NOTE — Progress Notes (Signed)
Pt pulled flexiseal loose after repeated attempts to yank it out. Foley cath emptied before transport (736mL). Pt unable to sign off on d/c paperwork.   [place]  Herlinda Heady, RN

## 2017-05-13 NOTE — Progress Notes (Signed)
Physical Therapy Treatment Patient Details Name: Richard Bennett MRN: [id number] DOB: 11-Jan-1951 Today's Date: 05/13/2017    History of Present Illness Pt is a 66 y.o. male admitted 04/11/17 with acute encephalopathy and seizures; UDS positive for cocaine and benzos. Intub 2/8-2/22. Required CRRT for a brief period. MRI 2/8 showed no acute intracranial abnormality. MRI 2/13 showed new bilateral infarcts (L>R), thought to be watershed infarcts; per cardiology, likely secondary to bilateral carotid stenosis in setting of HTN emergency secondary to cocaine use. MRA 2/13 negative for L MCA branch occlusion. No new brain imaging since 2/13. PMH includes CVA (07/2016), DM, HTN.    PT Comments    Pt performed seated balance activities, propping on R side and reaching Outside BOS.  Pt remains weak on R side which impairs his transfers and his abilities to perform standing and stand pivot.  Pt is to leave for rehab at SNF.  Pt is making more improvements at this time and much more appropriate for therapeutic interventions.    Follow Up Recommendations  SNF;Supervision/Assistance - 24 hour     Equipment Recommendations  (TBD at next venue)    Recommendations for Other Services OT consult     Precautions / Restrictions Precautions Precautions: Fall Precaution Comments: PEG, R-side inattention, dense hemiplegia Restrictions Weight Bearing Restrictions: No    Mobility  Bed Mobility Overal bed mobility: Needs Assistance Bed Mobility: Rolling;Sidelying to Sit Rolling: Min assist;Mod assist(min to R and mod to L) Sidelying to sit: Mod assist       General bed mobility comments: assist for LEs over EOB and to raise trunk, increased time and heavy reliance on R UE  Transfers Overall transfer level: Needs assistance Equipment used: None;2 person hand held assist Transfers: Squat Pivot Transfers     Squat pivot transfers: +2 physical assistance;Total assist     General transfer comment:  cues to reach for opposite arm of chair, minimal weight bearing through L LE  Ambulation/Gait                 Stairs            Wheelchair Mobility    Modified Rankin (Stroke Patients Only) Modified Rankin (Stroke Patients Only) Pre-Morbid Rankin Score: No symptoms Modified Rankin: Severe disability     Balance Overall balance assessment: Needs assistance Sitting-balance support: Feet supported Sitting balance-Leahy Scale: Fair Sitting balance - Comments: intermittent propping on L UE or with rail, statically Postural control: Right lateral lean;Posterior lean   Standing balance-Leahy Scale: Zero                              Cognition Arousal/Alertness: Awake/alert Behavior During Therapy: Flat affect Overall Cognitive Status: Impaired/Different from baseline Area of Impairment: Orientation;Attention;Following commands;Awareness;Problem solving                   Current Attention Level: Sustained   Following Commands: Follows one step commands inconsistently;Follows one step commands with increased time;Follows multi-step commands inconsistently Safety/Judgement: Decreased awareness of deficits Awareness: Intellectual Problem Solving: Slow processing;Decreased initiation;Difficulty sequencing;Requires verbal cues;Requires tactile cues General Comments: unreliable yes/no responses      Exercises      General Comments        Pertinent Vitals/Pain Pain Assessment: Faces Faces Pain Scale: No hurt    Home Living                      Prior Function  PT Goals (current goals can now be found in the care plan section) Acute Rehab PT Goals Patient Stated Goal: None stated Potential to Achieve Goals: Fair Progress towards PT goals: Progressing toward goals    Frequency    Min 3X/week      PT Plan Current plan remains appropriate    Co-evaluation PT/OT/SLP Co-Evaluation/Treatment: Yes Reason for  Co-Treatment: Complexity of the patient's impairments (multi-system involvement);Necessary to address cognition/behavior during functional activity;For patient/therapist safety PT goals addressed during session: Mobility/safety with mobility;Balance OT goals addressed during session: ADL's and self-care      AM-PAC PT "6 Clicks" Daily Activity  Outcome Measure  Difficulty turning over in bed (including adjusting bedclothes, sheets and blankets)?: Unable Difficulty moving from lying on back to sitting on the side of the bed? : Unable Difficulty sitting down on and standing up from a chair with arms (e.g., wheelchair, bedside commode, etc,.)?: Unable Help needed moving to and from a bed to chair (including a wheelchair)?: Total Help needed walking in hospital room?: Total Help needed climbing 3-5 steps with a railing? : Total 6 Click Score: 6    End of Session Equipment Utilized During Treatment: Gait belt Activity Tolerance: Patient tolerated treatment well Patient left: in bed;with call bell/phone within reach;with bed alarm set;with nursing/sitter in room Nurse Communication: Mobility status;Need for lift equipment PT Visit Diagnosis: Other abnormalities of gait and mobility (R26.89);Other symptoms and signs involving the nervous system (R29.898);Hemiplegia and hemiparesis Hemiplegia - Right/Left: Right Hemiplegia - caused by: Cerebral infarction     Time: 7026-3785 PT Time Calculation (min) (ACUTE ONLY): 30 min  Charges:  $Therapeutic Activity: 8-22 mins                    G CodesGovernor Bennett, PTA pager [phone]    Richard Bennett 05/13/2017, 6:11 PM

## 2017-05-13 NOTE — Discharge Instructions (Signed)
You had absorbable sutures placed in your scalp.  These will dissolve and fall out on their own in about 7 days.

## 2017-05-13 NOTE — Clinical Social Work Note (Addendum)
CSW facilitated patient discharge including contacting patient family and facility to confirm patient discharge plans. Clinical information faxed to facility and family agreeable with plan. CSW arranged ambulance transport via [facility] to [facility]. RN to call report prior to discharge [phone] Room 224B).  CSW will sign off for now as social work intervention is no longer needed. Please consult [place] again if new needs arise.  Dayton Scrape, [place]

## 2017-05-14 ENCOUNTER — Non-Acute Institutional Stay (SKILLED_NURSING_FACILITY): Payer: Medicare HMO | Admitting: Adult Health

## 2017-05-14 ENCOUNTER — Telehealth: Payer: Self-pay | Admitting: *Deleted

## 2017-05-14 ENCOUNTER — Encounter: Payer: Self-pay | Admitting: Adult Health

## 2017-05-14 DIAGNOSIS — IMO0002 Reserved for concepts with insufficient information to code with codable children: Secondary | ICD-10-CM

## 2017-05-14 DIAGNOSIS — I639 Cerebral infarction, unspecified: Secondary | ICD-10-CM | POA: Diagnosis not present

## 2017-05-14 DIAGNOSIS — N183 Chronic kidney disease, stage 3 unspecified: Secondary | ICD-10-CM

## 2017-05-14 DIAGNOSIS — G8101 Flaccid hemiplegia affecting right dominant side: Secondary | ICD-10-CM

## 2017-05-14 DIAGNOSIS — E43 Unspecified severe protein-calorie malnutrition: Secondary | ICD-10-CM | POA: Diagnosis not present

## 2017-05-14 DIAGNOSIS — E1165 Type 2 diabetes mellitus with hyperglycemia: Secondary | ICD-10-CM

## 2017-05-14 DIAGNOSIS — E785 Hyperlipidemia, unspecified: Secondary | ICD-10-CM

## 2017-05-14 DIAGNOSIS — E1151 Type 2 diabetes mellitus with diabetic peripheral angiopathy without gangrene: Secondary | ICD-10-CM

## 2017-05-14 DIAGNOSIS — Z931 Gastrostomy status: Secondary | ICD-10-CM | POA: Diagnosis not present

## 2017-05-14 DIAGNOSIS — I82611 Acute embolism and thrombosis of superficial veins of right upper extremity: Secondary | ICD-10-CM

## 2017-05-14 DIAGNOSIS — D638 Anemia in other chronic diseases classified elsewhere: Secondary | ICD-10-CM

## 2017-05-14 DIAGNOSIS — E1169 Type 2 diabetes mellitus with other specified complication: Secondary | ICD-10-CM

## 2017-05-14 NOTE — Progress Notes (Addendum)
Location:   [facility] Room Number: [place] of Service:  SNF (31)   CODE STATUS: Full Code  No Known Allergies  Chief Complaint  Patient presents with  . Hospitalization Follow-up    Hospital Follow up    HPI:  He is a 67 year old with a history of diabetes; hypertension; polysubstance abuse who has been hospitalized for bilateral periventricular and watershed cva; with aphasia and right side flaccid. He is here for rehab; more than likely this does represent a long term placement for him. He is unable to participate in the hpi or ros. There are no reports of aspiration; on reports of poor appetite; no indications of uncontrolled pain. He will continue to be followed for his chronic illnesses including: hypertension; diabetes and epilepticus. There are no nursing concerns at this time.   Past Medical History:  Diagnosis Date  . Chest pain   . Colon polyps    adenomatous  . Hyperlipidemia   . Hypertension   . Refusal of blood transfusions as patient is Jehovah's Witness   . Stroke ([place])   . Type II diabetes mellitus ([place])     Past Surgical History:  Procedure Laterality Date  . APPENDECTOMY    . BACK SURGERY    . IR GASTROSTOMY TUBE MOD SED  04/29/2017  . LUMBAR DISC SURGERY     "herniated"    Social History   Socioeconomic History  . Marital status: Divorced    Spouse name: Not on file  . Number of children: 4  . Years of education: 41  . Highest education level: Not on file  Social Needs  . Financial resource strain: Not on file  . Food insecurity - worry: Not on file  . Food insecurity - inability: Not on file  . Transportation needs - medical: Not on file  . Transportation needs - non-medical: Not on file  Occupational History  . Occupation: Truck - Local  Tobacco Use  . Smoking status: Former Smoker    Packs/day: 0.50    Years: 20.00    Pack years: 10.00    Types: Cigarettes    Last attempt to quit: 03/04/1978    Years since  quitting: 39.2  . Smokeless tobacco: Never Used  Substance and Sexual Activity  . Alcohol use: Yes    Comment: 10/30/2015 "might have 2-3 drinks/year"  . Drug use: Yes    Types: Heroin    Comment: 10/30/2015 "nothing in the 2000s"  . Sexual activity: No  Other Topics Concern  . Not on file  Social History Narrative   Fun: Cycling    Lives alone.  Is retired. Education 12 th grade.  Single.  Children - 4.  He is Jehovah witness.    Family History  Problem Relation Age of Onset  . Diabetes Mother   . Stroke Mother   . Cancer Father   . Diabetes Brother   . Seizures Brother   . Colon cancer Neg Hx   . Esophageal cancer Neg Hx   . Stomach cancer Neg Hx   . Rectal cancer Neg Hx       VITAL SIGNS BP (!) 182/98   Pulse 88   Temp 99.7 F (37.6 C)   Resp 18   SpO2 98%   Outpatient Encounter Medications as of 05/14/2017  Medication Sig  . acetaminophen (TYLENOL) 160 MG/5ML solution Place 20.3 mLs (650 mg total) into feeding tube every 6 (six) hours as needed for mild  pain or fever.  Marland Kitchen alum & mag hydroxide-simeth (MAALOX/MYLANTA) 200-200-20 MG/5ML suspension Take 15 mLs by mouth every 4 (four) hours as needed for indigestion or heartburn.  Marland Kitchen amLODipine (NORVASC) 10 MG tablet Take 10 mg by mouth daily.  Marland Kitchen aspirin 325 MG tablet Place 1 tablet (325 mg total) into feeding tube daily.  Marland Kitchen atorvastatin (LIPITOR) 40 MG tablet Place 1 tablet (40 mg total) into feeding tube daily at 6 PM.  . bisacodyl (DULCOLAX) 10 MG suppository Place 1 suppository (10 mg total) rectally daily as needed for moderate constipation.  . cloNIDine (CATAPRES) 0.2 MG tablet Place 1 tablet (0.2 mg total) into feeding tube 3 (three) times daily.  Marland Kitchen docusate (COLACE) 50 MG/5ML liquid Place 10 mLs (100 mg total) into feeding tube 2 (two) times daily as needed for mild constipation.  . folic acid (FOLVITE) 1 MG tablet Place 1 tablet (1 mg total) into feeding tube daily.  . insulin aspart (NOVOLOG) 100 UNIT/ML injection  Inject 0-5 Units into the skin at bedtime.  . insulin aspart (NOVOLOG) 100 UNIT/ML injection Inject 0-9 Units into the skin 3 (three) times daily with meals.  . insulin glargine (LANTUS) 100 UNIT/ML injection Inject 0.18 mLs (18 Units total) into the skin daily.  Marland Kitchen labetalol (NORMODYNE) 200 MG tablet Place 1 tablet (200 mg total) into feeding tube every 8 (eight) hours.  . levETIRAcetam (KEPPRA) 100 MG/ML solution Place 5 mLs (500 mg total) into feeding tube 2 (two) times daily.  . Multiple Vitamin (MULTIVITAMIN WITH MINERALS) TABS tablet Take 1 tablet by mouth daily.  . polyethylene glycol (MIRALAX / GLYCOLAX) packet Place 17 g into feeding tube daily as needed for severe constipation.  . thiamine 100 MG tablet Place 1 tablet (100 mg total) into feeding tube daily.  . Water For Irrigation, Sterile (FREE WATER) SOLN Place 400 mLs into feeding tube every 6 (six) hours.   No facility-administered encounter medications on file as of 05/14/2017.      SIGNIFICANT DIAGNOSTIC EXAMS  TODAY:   04-11-17: chest x-ray: 1. Endotracheal tube seen ending 3-4 cm above the carina. 2. Vascular congestion. Right perihilar airspace opacity is new from the prior study and may reflect asymmetric interstitial edema.  04-11-17: ct of head and cervical spine: 1. No CT evidence for acute intracranial abnormality. Atrophy and small vessel ischemic changes of the white matter. 2. Straightening of the cervical spine. No definite acute osseous Abnormality.  04-11-17: MRI brain: 1. Small, symmetric bilateral subdural CSF hygromas are new since the 2018 MRI, measuring 5 mm each. No midline shift and minimal associated intracranial mass effect. 2. No acute intracranial hemorrhage. No other acute intracranial abnormality. 3. Chronic nonspecific cerebral white matter signal changes.  04-13-17: ct of head: 1. Progressive white matter hypoattenuation in the posterior left frontal lobe is concerning for infarct. Infection is  considered less likely. There may be some cortical involvement is well. The changes are mostly subcortical. 2. Stable white matter disease on the right. 3. Bilateral subdural hygromas with increased since the previous CT scan.  04-14-17: carotid doppler:  Right Carotid: Elevated common carotid artery velocities, along with plaque morphology suggest hemodynamically significant stenosis versus  proximal obstruction. Elevated right internal carotid artery velocities suggest low range 40-59% stenosis, however this is likely due to proximal obstruction given lack of significant plaque morphology. Left Carotid: Elevated common carotid artery velocities, along with significant plaque morphology is suggestive of >50% stenosis. This is consistent with previous findings by CTA and ultrasound. Elevated  left internal carotid artery velocities suggest low range 60-79% stenosis, however this may be due to proximal obstruction given lack of significant plaque morphology. Vertebrals:  Right vertebral artery was patent with antegrade flow. Left vertebral artery exhibits atypical waveform, suggestive of possible proximal obstruction. Subclavians: Left subclavian artery was moderately stenotic. Normal flow hemodynamics were seen in the right subclavian artery. Correlate with CT angio if clinically necessary  04-16-17 MRA head: 1. Positive for mild to moderate irregularity and stenosis of multiple left MCA M2 and M3 branches, but negative for left MCA branch occlusion. 2. Otherwise negative intracranial MRA.  04-16-17: renal ultrasound: 1. No hydronephrosis. Increased renal parenchymal echogenicity is consistent with chronic medical renal disease. 2. Incidentally visualized small pleural effusions.  04-16-17: MRI brain: New areas of acute infarct bilaterally, left greater than right. These appear to be watershed infarcts bilaterally. Negative for hemorrhage.  05-15-17: 2-d echo:   - Left ventricle: The cavity size was  normal. Wall thickness was increased in a pattern of moderate LVH. There was severe concentric hypertrophy. Systolic function was normal. The estimated ejection fraction was in the range of 60% to 65%. Wall motion was normal; there were no regional wall motion abnormalities. Doppler parameters are consistent with abnormal left ventricular relaxation (grade 1 diastolic dysfunction). The E/e&' ratio is between 8-15, suggesting indeterminate LV filling pressure. - Aortic valve: Trileaflet. There was no stenosis. There was no regurgitation. - Mitral valve: Mildly thickened leaflets . There was mild regurgitation. - Left atrium: The atrium was normal in size. - Inferior vena cava: The vessel was dilated. The respirophasic diameter changes were blunted (< 50%), consistent with elevated central venous pressure. On ventilator.   05-02-17: right upper extremity doppler: No evidence of obstruction was seen in the central veins. Evidence of acute superficial venous thrombosis in the basilic vein.   04-22-17: chest x-ray: Cardiomegaly. No active disease.   04-29-17: peg tube placed  05-02-17: swallow study: Continue NPO recommended and speech therapy to improve oral coordination and ability to transfer boluses.   05-05-17: ct of abdomen and pelvis: 1. Small amount of nonspecific perisplenic fluid. 2. Otherwise, no acute abdominal or pelvic pathology. 3. Small bilateral pleural effusion with dependent atelectasis. 4.  Aortic Atherosclerosis (ICD10-I70.0). 5. Gastrostomy tube in satisfactory position.  05-06-17: chest x-ray: Mild hypoinflation with patchy bibasilar opacities which may reflect pneumonia or atelectasis.     LABS REVIEWED TODAY:   04-11-17: wbc 5.5; hgb 11.0; hct 33.5; mcv 81.7; plt 214; glucose 715; bun 21; creat 2.71; k+ 3.5; na++ 135; ca 8.2; ast 52; albumin 2.8; ammonia 52; mag 1.8; phos 2.2; trig 87; blood culture: no growth; urine drug screen: + cocaine + benzo 04-12-17: dilantin 18.7 04-15-17: wbc  7.7; hgb 9.3; hct 30.0; mcv 85.7; plt 194; glucose 281; bun 41; creat 3.19; k+ 5.7; na++ 146; ca 8.1; mag 1.8 04-17-17: wbc 8.5; hgb 8.0; hct 25.7; mcv 84.5; plt 195; glucose 86; bun 60; creat 3.28; k+ 4.4; na++ 148; albumin 1.8; phos 3.8; chol 172; ldl 111; trig 98; hdl 41; hgb a1c 11.8 04-20-17: wbc 5.5; hgb 7.3; hct 22.8; mcv 82.9; plt 209; glucose 97; bun 71; creat 3.11; k+ 3.5; na++ 140; ca 8.2   04-23-17: wbc 10.0; hgb 7.5; hct 24.0; mcv 83.9; plt 248; glucose 197; bun 106; creat 3.31; k+ 4.1; na++ 140; ca 8.5; mag 2.3 04-27-17: wbc 8.5; hgb 8.0; hct 25.6; mcv 85.9; plt 282; glucose 178; bun 113; creat 2.71; k+ 3.9; na++ 141; ca 7.5; phos  4.7; albumin 2.2 04-28-17: vit B 12: 894; iron 49; tibc 286; folate 22.9; ferritin 276; mag 2.7  04-30-17: glucose 200; bun 71; creat 2.43; k+ 4.5; na++ 1152; ca 8.5; ast 46; alk phos 214; albumin 2.1  05-04-17: wbc 8.8; hgb 7.8; hct 25.1; mcv 85.4; plt 248; glucose 172; bun 61; creat 2.01; k+ 4.3; na++ 144; ca 8.3 05-06-17: wbc 15.0; hgb 7.7; hct 24.8; mcv 84.6; plt 216; glucose 232; bun 56; creat 2.31; k+ 5.0; na++ 140; ca 8.2; alk phos 205; albumin 1.9 05-11-17; wbc 7.7; hgb 7.4; hct 23.5; mcv 82.2; plt 228; glucose 434; bun 60; creat 3.12; k+ 3.5; na++ 142; ca 7.8; mag 2.3 hgb a1c 10.6; chol 124; ldl 65; trig 212; hdl 17  05-13-17: wbc 8.2; hgb 7.8; hct 24.3; mcv 82.4; plt 255; glucose 172; bun 49; creat 2.88; k+ 3.8; na++ 144; ca 7.8   Review of Systems  Unable to perform ROS: Other (expressive aphasia )    /Physical Exam  Constitutional: He appears well-developed and well-nourished. No distress.  Overweight   Neck: No JVD present. No thyromegaly present.  Cardiovascular: Normal rate, regular rhythm and intact distal pulses.  Murmur heard. 1/6  Pulmonary/Chest: Effort normal and breath sounds normal. No respiratory distress.  Abdominal: Soft. Bowel sounds are normal. He exhibits no distension. There is no tenderness.  Peg tube present without signs of  infection present   Genitourinary:  Genitourinary Comments: Has foley   Musculoskeletal: He exhibits no edema.  Right side is flaccid Left side is weak   Lymphadenopathy:    He has no cervical adenopathy.  Neurological: He is alert.  Skin: Skin is warm and dry. He is not diaphoretic.     ASSESSMENT/ PLAN:  TODAY:   1. Dysphagia due to recent cerebrovascular accident: stable without signs of infection present; will continue honey thick liquids; does have a peg tube present.   2. 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  Acute basilic vein embolism on right: is stable will continue asa 325 mg daily anticoagulation had been stopped due to low hgb. He did not have a picc line in place in the right arm during this hospitalization.   5. Hypertension associated with diabetes: without change b/p: 182/90 will continue norvasc 10 mg daily; clonidine 0.2 mg three times daily; labetolol 200 mg every 8 hours;  will begin hydralazine 12.5 mg twice daily his blood pressure goal is 140-160 due to his bilateral carotid stenosis.   6. Diabetes type 2 uncontrolled, peripheral vascular complications: hgb T9Q 30.0: without change: will continue novolog 5 units with meals; and lantus 18 units nightly and will monitor his status; he is on asa and statin; not on ace/arb due to renal function.   7. Status epilepticus: stable will continue keppra 500 mg twice daily   8. Dyslipidemia associated with type 2 diabetes mellitus: stable ldl 65;trig 212 will continue lipitor 40 mg daily   9. CKD stage 3, GFR 30-59: stable bun 49 creat 2.88 will monitor  10. Anemia of chronic disease: stable hgb 7.8; declines transfusions; is Fara Boros witness  11. Polysubstance abuse: history of cocaine abuse. Will continue  thiamine and folic acid  12. Severe protein calorie malnutrition: albumin is 1.9; will being prostat 30 cc three times daily  13. Urine retention: has foley will need  to be seen by urology; will monitor     Will check cmp; cbc;    MD is aware of resident's narcotic use and is in agreement with current plan of care. We will attempt to wean resident as apropriate   Ok Edwards NP [facility]  Contact [phone] Monday through Friday 8am- 5pm  After hours call [phone]

## 2017-05-14 NOTE — Telephone Encounter (Signed)
Jolane @ [place] she can be reached at [phone], calling to schedule 6 wk f/u with Dr. Leta Baptist.  He was seen in [facility] for:  Stroke, Seizures.

## 2017-05-14 NOTE — Progress Notes (Signed)
Entered in error

## 2017-05-15 ENCOUNTER — Non-Acute Institutional Stay (SKILLED_NURSING_FACILITY): Payer: Medicare HMO | Admitting: Internal Medicine

## 2017-05-15 ENCOUNTER — Encounter: Payer: Self-pay | Admitting: Internal Medicine

## 2017-05-15 DIAGNOSIS — E1151 Type 2 diabetes mellitus with diabetic peripheral angiopathy without gangrene: Secondary | ICD-10-CM

## 2017-05-15 DIAGNOSIS — I6522 Occlusion and stenosis of left carotid artery: Secondary | ICD-10-CM | POA: Diagnosis not present

## 2017-05-15 DIAGNOSIS — Z8673 Personal history of transient ischemic attack (TIA), and cerebral infarction without residual deficits: Secondary | ICD-10-CM | POA: Diagnosis not present

## 2017-05-15 DIAGNOSIS — R569 Unspecified convulsions: Secondary | ICD-10-CM

## 2017-05-15 DIAGNOSIS — I10 Essential (primary) hypertension: Secondary | ICD-10-CM

## 2017-05-15 DIAGNOSIS — N183 Chronic kidney disease, stage 3 unspecified: Secondary | ICD-10-CM

## 2017-05-15 DIAGNOSIS — IMO0002 Reserved for concepts with insufficient information to code with codable children: Secondary | ICD-10-CM

## 2017-05-15 DIAGNOSIS — E1169 Type 2 diabetes mellitus with other specified complication: Secondary | ICD-10-CM

## 2017-05-15 DIAGNOSIS — I82611 Acute embolism and thrombosis of superficial veins of right upper extremity: Secondary | ICD-10-CM

## 2017-05-15 DIAGNOSIS — E785 Hyperlipidemia, unspecified: Secondary | ICD-10-CM

## 2017-05-15 DIAGNOSIS — I639 Cerebral infarction, unspecified: Secondary | ICD-10-CM

## 2017-05-15 DIAGNOSIS — E43 Unspecified severe protein-calorie malnutrition: Secondary | ICD-10-CM | POA: Diagnosis not present

## 2017-05-15 DIAGNOSIS — E1165 Type 2 diabetes mellitus with hyperglycemia: Secondary | ICD-10-CM

## 2017-05-15 DIAGNOSIS — N319 Neuromuscular dysfunction of bladder, unspecified: Secondary | ICD-10-CM

## 2017-05-15 DIAGNOSIS — G8101 Flaccid hemiplegia affecting right dominant side: Secondary | ICD-10-CM

## 2017-05-15 NOTE — Progress Notes (Signed)
Patient ID: Richard Bennett, male   DOB: 1950/03/28, 67 y.o.   MRN: [id number]  Provider:  DR Arletha Grippe Location:  [place] Room Number: [place] of Service:  SNF ([id number])  PCP: Richard Ebbs, MD Patient Care Team: Richard Ebbs, MD as PCP - General (Internal Medicine)  Extended Emergency Contact Information Primary Emergency Contact: Bennett,Richard Address: [address]          [place], [place] [address] Johnnette Litter of [place] Phone: [phone] Mobile Phone: [phone] Relation: Brother Secondary Emergency Contact: Bennett,Richard Address: [address]          [place], [place] [address] [place] Work Phone: [phone] Mobile Phone: [phone] Relation: Relative  Code Status: Full Code Goals of Care: Advanced Directive information Advanced Directives 05/15/2017  Does Patient Have a Medical Advance Directive? No  Would patient like information on creating a medical advance directive? No - Patient declined      Chief Complaint  Patient presents with  . New Admit To SNF    Admission    HPI: Patient is a 67 y.o. 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 ASA used for anticoagulation. Neurology consulted. He req'd HD. FUO occurred with no source identified. He did receive broad spectrum abx x 7 days. RUE venous doppler US revealed nonocclusive right basilic vein DVT. He rec'd anticoagulation with Heparin but it was stopped due to dropping Hgb and pt refused transfusion. UDS (+) for cocaine on admission and BZDs (given in the ED). Cr peaked 3.31--> 2.88;  albumin 1.8; ASt 74; alk phos 200; Na peaked 148-->141; Hgb 7.8 (nml ferritin/folate/B12; iron 49);  A1c 11.8%; LDL 111 at d/c. He  presents to SNF for short term rehab.  Today he has no concerns. Nursing reports frequent falls OOB (at least 2-3 per day). BP remains elevated 180-190/80-90s. CBGs 150-170s. No low BS reactions. 48 hr Care Plan Mtg completed today. He is a poor historian due to nonverbal state and expressive aphasia. Hx obtained from chart. He is taking ASA daily. He has a foley cath 2/2 urinary retention  HTN - BP elevated. He takes norvasc 10 mg daily; clonidine 0.2 mg three times daily; labetolol 200 mg every 8 hours; hydralazine 12.5 mg twice daily. SBP goal 140-160 due to his bilateral carotid stenosis.   DM - uncontrolled. A1c 10.6%. He takes novolog 5 units with meals; lantus 18 units nightly. He has PAD and takes ASA and statin; he is not on ACE/ARB due to renal function (CKDstage 3)  Status epilepticus/sz d/o -  stable on keppra 500 mg twice daily   Dyslipidemia - stable on lipitor 40 mg daily. LDL 65; TG 212  CKD - stage 3. Cr 2.88  Anemia of chronic disease - stable. He has declined PRBCs/blood pdts due to religion (Jehovah Witness) hgb 7.8;   Polysubstance abuse/hx cocaine abuse - takes thiamine and folic acid  Severe protein calorie malnutrition - albumin 1.9; he gets  prostat 30 cc three times daily   Past Medical History:  Diagnosis Date  . Chest pain   . Colon polyps    adenomatous  . Hyperlipidemia   . Hypertension   . Refusal of blood transfusions as patient is Jehovah's Witness   . Stroke ([place])   .  Type II diabetes mellitus ([place])    Past Surgical History:  Procedure Laterality Date  . APPENDECTOMY    . BACK SURGERY    . IR GASTROSTOMY TUBE MOD SED  04/29/2017  . LUMBAR DISC SURGERY     "herniated"    reports that he quit smoking about 39 years ago. His smoking use included cigarettes. He has a 10.00 pack-year smoking history. he has never used smokeless tobacco. He reports that he drinks alcohol. He reports that he uses drugs. Drug: Heroin. Social History   Socioeconomic  History  . Marital status: Divorced    Spouse name: Not on file  . Number of children: 4  . Years of education: 73  . Highest education level: Not on file  Social Needs  . Financial resource strain: Not on file  . Food insecurity - worry: Not on file  . Food insecurity - inability: Not on file  . Transportation needs - medical: Not on file  . Transportation needs - non-medical: Not on file  Occupational History  . Occupation: Truck - Local  Tobacco Use  . Smoking status: Former Smoker    Packs/day: 0.50    Years: 20.00    Pack years: 10.00    Types: Cigarettes    Last attempt to quit: 03/04/1978    Years since quitting: 39.2  . Smokeless tobacco: Never Used  Substance and Sexual Activity  . Alcohol use: Yes    Comment: 10/30/2015 "might have 2-3 drinks/year"  . Drug use: Yes    Types: Heroin    Comment: 10/30/2015 "nothing in the 2000s"  . Sexual activity: No  Other Topics Concern  . Not on file  Social History Narrative   Fun: Cycling    Lives alone.  Is retired. Education 12 th grade.  Single.  Children - 4.  He is Jehovah witness.     Functional Status Survey:    Family History  Problem Relation Age of Onset  . Diabetes Mother   . Stroke Mother   . Cancer Father   . Diabetes Brother   . Seizures Brother   . Colon cancer Neg Hx   . Esophageal cancer Neg Hx   . Stomach cancer Neg Hx   . Rectal cancer Neg Hx     Health Maintenance  Topic Date Due  . FOOT EXAM  08/05/2017 (Originally 01/21/1961)  . OPHTHALMOLOGY EXAM  08/05/2017 (Originally 01/21/1961)  . Hepatitis C Screening  08/05/2017 (Originally Mar 29, 1950)  . INFLUENZA VACCINE  05/15/2018 (Originally 10/02/2016)  . URINE MICROALBUMIN  05/15/2018 (Originally 01/21/1961)  . PNA vac Low Risk Adult (1 of 2 - PCV13) 05/15/2018 (Originally 10/30/2016)  . HEMOGLOBIN A1C  11/11/2017  . COLONOSCOPY  10/19/2018  . TETANUS/TDAP  04/12/2027    No Known Allergies  Outpatient Encounter Medications as of 05/15/2017    Medication Sig  . acetaminophen (TYLENOL) 160 MG/5ML solution Place 20.3 mLs (650 mg total) into feeding tube every 6 (six) hours as needed for mild pain or fever.  Marland Kitchen alum & mag hydroxide-simeth (MAALOX/MYLANTA) 200-200-20 MG/5ML suspension Take 15 mLs by mouth every 4 (four) hours as needed for indigestion or heartburn.  Marland Kitchen amLODipine (NORVASC) 10 MG tablet Take 10 mg by mouth daily.  Marland Kitchen aspirin 325 MG tablet Place 1 tablet (325 mg total) into feeding tube daily.  Marland Kitchen atorvastatin (LIPITOR) 40 MG tablet Place 1 tablet (40 mg total) into feeding tube daily at 6 PM.  . bisacodyl (DULCOLAX) 10 MG suppository Place 1 suppository (  10 mg total) rectally daily as needed for moderate constipation.  . cloNIDine (CATAPRES) 0.2 MG tablet Place 1 tablet (0.2 mg total) into feeding tube 3 (three) times daily.  Marland Kitchen docusate (COLACE) 50 MG/5ML liquid Place 10 mLs (100 mg total) into feeding tube 2 (two) times daily as needed for mild constipation.  . folic acid (FOLVITE) 1 MG tablet Place 1 tablet (1 mg total) into feeding tube daily.  . insulin aspart (NOVOLOG FLEXPEN) 100 UNIT/ML FlexPen Inject 5 Units into the skin. After meals  . insulin glargine (LANTUS) 100 UNIT/ML injection Inject 0.18 mLs (18 Units total) into the skin daily.  Marland Kitchen labetalol (NORMODYNE) 200 MG tablet Place 1 tablet (200 mg total) into feeding tube every 8 (eight) hours.  . levETIRAcetam (KEPPRA) 100 MG/ML solution Place 5 mLs (500 mg total) into feeding tube 2 (two) times daily.  . Multiple Vitamin (MULTIVITAMIN WITH MINERALS) TABS tablet Take 1 tablet by mouth daily.  . Nutritional Supplements (NUTRITIONAL SUPPLEMENT PO) Low Concentrated Sweets Diet -  Pureed texture, honey thickened Fluids consistency  . Nutritional Supplements (PROMOD) LIQD Give 30 cc by mouth three times daily for low albumin  . polyethylene glycol (MIRALAX / GLYCOLAX) packet Place 17 g into feeding tube daily as needed for severe constipation.  . thiamine 100 MG tablet  Place 1 tablet (100 mg total) into feeding tube daily.  . Water For Irrigation, Sterile (FREE WATER) SOLN Place 400 mLs into feeding tube every 6 (six) hours.  . [DISCONTINUED] insulin aspart (NOVOLOG) 100 UNIT/ML injection Inject 0-5 Units into the skin at bedtime. (Patient not taking: Reported on 05/15/2017)  . [DISCONTINUED] insulin aspart (NOVOLOG) 100 UNIT/ML injection Inject 0-9 Units into the skin 3 (three) times daily with meals. (Patient not taking: Reported on 05/15/2017)   No facility-administered encounter medications on file as of 05/15/2017.     Review of Systems  Unable to perform ROS: Patient nonverbal    Vitals:   05/15/17 1059  Weight: 172 lb 4.8 oz (78.2 kg)  Height: 5' 8"  (1.727 m)   Body mass index is 26.2 kg/m. Physical Exam  Constitutional: He appears well-developed and well-nourished.  Lying in bed in NAD  HENT:  Mouth/Throat: Oropharynx is clear and moist.  MMM; no oral thrush  Eyes: Pupils are equal, round, and reactive to light. No scleral icterus.  Neck: Neck supple. Carotid bruit is not present. No thyromegaly present.  Cardiovascular: Normal rate, regular rhythm and intact distal pulses. Exam reveals no gallop and no friction rub.  Murmur (1/6 SEM) heard. RUE edema noted; RLE swelling present; no calf TTP b/l  Pulmonary/Chest: Effort normal and breath sounds normal. He has no wheezes. He has no rales. He exhibits no tenderness.  Abdominal: Soft. Normal appearance and bowel sounds are normal. He exhibits no distension, no abdominal bruit, no pulsatile midline mass and no mass. There is no hepatomegaly. There is no tenderness. There is no rigidity, no rebound and no guarding. No hernia.  Peg tube intact and clamped with no redness or d/c at insertion site   Genitourinary:  Genitourinary Comments: Foley cath intact and DTG clear yellow urine  Musculoskeletal: He exhibits edema and tenderness.  Lymphadenopathy:    He has no cervical adenopathy.    Neurological: He is alert. Gait abnormal.  Right hemiparesis; does not move LLE spontaneously; aphasic  Skin: Skin is warm and dry. No rash noted.  Psychiatric: He has a normal mood and affect. His behavior is normal. Thought content normal.  Labs reviewed: Basic Metabolic Panel: Recent Labs    04/26/17 0714 04/27/17 0234 04/28/17 0213  05/08/17 0843  05/11/17 1054 05/12/17 0243 05/13/17 0227  NA 141 141 146*   < > 140   < > 142 146* 144  K 3.7 3.9 3.6   < > 4.5   < > 3.5 3.5 3.8  CL 101 105 108   < > 110   < > 113* 117* 118*  CO2 23 23 25    < > 16*   < > 19* 17* 15*  GLUCOSE 171* 178* 122*   < > 250*   < > 434* 147* 172*  BUN 113* 113* 109*   < > 62*   < > 60* 59* 49*  CREATININE 3.02* 2.71* 2.63*   < > 2.90*   < > 3.12* 3.31* 2.88*  CALCIUM 8.7* 8.5* 8.6*   < > 8.0*   < > 7.8* 8.1* 7.8*  MG  --   --  2.7*   < > 2.4  --  2.3 2.3  --   PHOS 5.5* 4.7* 3.9  --   --   --   --   --   --    < > = values in this interval not displayed.   Liver Function Tests: Recent Labs    05/07/17 0223 05/08/17 0843 05/10/17 0551  AST 43* 47* 74*  ALT 26 26 37  ALKPHOS 218* 180* 200*  BILITOT 0.8 0.5 0.5  PROT 6.2* 5.2* 5.9*  ALBUMIN 1.8* 1.6* 1.8*   No results for input(s): LIPASE, AMYLASE in the last 8760 hours. Recent Labs    07/21/16 2304 04/11/17 0141  AMMONIA 40* 52*   CBC: Recent Labs    07/27/16 0318  04/14/17 0608  05/08/17 1137  05/11/17 1054 05/12/17 0243 05/13/17 0227  WBC 7.3   < > 8.3   < > 11.9*   < > 7.7 8.3 8.2  NEUTROABS 4.5  --  5.9  --  9.7*  --   --   --   --   HGB 9.9*   < > 9.3*   < > 7.5*   < > 7.4* 7.9* 7.8*  HCT 30.9*   < > 29.6*   < > 23.3*   < > 23.5* 25.1* 24.3*  MCV 81.5   < > 84.6   < > 82.9   < > 82.2 81.8 82.4  PLT 241   < > 154   < > 203   < > 228 238 255   < > = values in this interval not displayed.   Cardiac Enzymes: Recent Labs    07/22/16 1551 04/11/17 0140 04/11/17 0523 04/11/17 1001  CKTOTAL  --  97  --   --   TROPONINI  0.14*  --  0.06* 0.11*   BNP: Invalid input(s): POCBNP Lab Results  Component Value Date   HGBA1C 10.6 (H) 05/11/2017   No results found for: TSH Lab Results  Component Value Date   VITAMINB12 894 04/28/2017   Lab Results  Component Value Date   FOLATE 22.9 04/28/2017   Lab Results  Component Value Date   IRON 49 04/28/2017   TIBC 286 04/28/2017   FERRITIN 276 04/28/2017    Imaging and Procedures obtained prior to SNF admission: Ct Head Wo Contrast  Result Date: 04/11/2017 CLINICAL DATA:  Right-sided weakness and posturing EXAM: CT HEAD WITHOUT CONTRAST TECHNIQUE: Contiguous axial images were obtained from the base of the skull  through the vertex without intravenous contrast. COMPARISON:  04/11/2017 FINDINGS: Brain: No evidence of acute infarction, hemorrhage, hydrocephalus, extra-axial collection or mass lesion/mass effect. Diffuse atrophic changes are again noted. Vascular: No hyperdense vessel or unexpected calcification. Skull: Normal. Negative for fracture or focal lesion. Sinuses/Orbits: Sinuses are well aerated. Small mucosal retention cyst is noted within the both the right maxillary antrum and left ethmoid sinuses. No air-fluid level is noted. Other: None IMPRESSION: Chronic atrophic changes without acute abnormality. No significant interval change from the previous exam is noted. Electronically Signed   By: Inez Catalina M.D.   On: 04/11/2017 07:41   Ct Head Wo Contrast  Result Date: 04/11/2017 CLINICAL DATA:  Altered mental status with seizure EXAM: CT HEAD WITHOUT CONTRAST CT CERVICAL SPINE WITHOUT CONTRAST TECHNIQUE: Multidetector CT imaging of the head and cervical spine was performed following the standard protocol without intravenous contrast. Multiplanar CT image reconstructions of the cervical spine were also generated. COMPARISON:  CT head and C-spine 07/21/2016 FINDINGS: CT HEAD FINDINGS Brain: No acute territorial infarction, hemorrhage, or intracranial mass is seen.  Hypodensity within the periventricular white matter consistent with small-vessel ischemic changes. Stable ventricle size. Slight prominence of extra-axial CSF density at the convexities, could be due to atrophy or tiny chronic subdural effusions. Vascular: No hyperdense vessels. Scattered calcifications at the carotid siphons. Skull: Normal. Negative for fracture or focal lesion. Sinuses/Orbits: Old fracture medial wall left orbit. Mild sinus mucosal disease in the ethmoid sinuses. Small mucous retention cyst right maxillary sinus. No acute orbital abnormality. Other: None CT CERVICAL SPINE FINDINGS Alignment: Straightening of the cervical spine. No subluxation. Facet alignment within normal limits. Skull base and vertebrae: No acute fracture. No primary bone lesion or focal pathologic process. Soft tissues and spinal canal: No prevertebral fluid or swelling. No visible canal hematoma. Disc levels:  Moderate degenerative changes at C5-C6 and C6-C7. Upper chest: Negative. Other: None IMPRESSION: 1. No CT evidence for acute intracranial abnormality. Atrophy and small vessel ischemic changes of the white matter. 2. Straightening of the cervical spine. No definite acute osseous abnormality. Electronically Signed   By: Donavan Foil M.D.   On: 04/11/2017 02:29   Ct Cervical Spine Wo Contrast  Result Date: 04/11/2017 CLINICAL DATA:  Altered mental status with seizure EXAM: CT HEAD WITHOUT CONTRAST CT CERVICAL SPINE WITHOUT CONTRAST TECHNIQUE: Multidetector CT imaging of the head and cervical spine was performed following the standard protocol without intravenous contrast. Multiplanar CT image reconstructions of the cervical spine were also generated. COMPARISON:  CT head and C-spine 07/21/2016 FINDINGS: CT HEAD FINDINGS Brain: No acute territorial infarction, hemorrhage, or intracranial mass is seen. Hypodensity within the periventricular white matter consistent with small-vessel ischemic changes. Stable ventricle  size. Slight prominence of extra-axial CSF density at the convexities, could be due to atrophy or tiny chronic subdural effusions. Vascular: No hyperdense vessels. Scattered calcifications at the carotid siphons. Skull: Normal. Negative for fracture or focal lesion. Sinuses/Orbits: Old fracture medial wall left orbit. Mild sinus mucosal disease in the ethmoid sinuses. Small mucous retention cyst right maxillary sinus. No acute orbital abnormality. Other: None CT CERVICAL SPINE FINDINGS Alignment: Straightening of the cervical spine. No subluxation. Facet alignment within normal limits. Skull base and vertebrae: No acute fracture. No primary bone lesion or focal pathologic process. Soft tissues and spinal canal: No prevertebral fluid or swelling. No visible canal hematoma. Disc levels:  Moderate degenerative changes at C5-C6 and C6-C7. Upper chest: Negative. Other: None IMPRESSION: 1. No CT evidence for acute intracranial  abnormality. Atrophy and small vessel ischemic changes of the white matter. 2. Straightening of the cervical spine. No definite acute osseous abnormality. Electronically Signed   By: Donavan Foil M.D.   On: 04/11/2017 02:29   Mr Brain Wo Contrast  Result Date: 04/11/2017 CLINICAL DATA:  67 year old male with unexplained altered mental status and seizures. Hyperglycemia. EXAM: MRI HEAD WITHOUT CONTRAST TECHNIQUE: Multiplanar, multiecho pulse sequences of the brain and surrounding structures were obtained without intravenous contrast. COMPARISON:  Head CT without contrast 0707 hr today and earlier. Brain MRI 07/22/2016 FINDINGS: Brain: Bilateral CSF isointense subdural fluid collections are new since May 2018, measuring about 5 millimeters in thickness bilaterally (series 13, image 17, series 16, image 16). No midline shift. Minimal associated hemisphere mass effect. No acute intracranial hemorrhage identified. No restricted diffusion to suggest acute infarction. No midline shift, mass effect,  evidence of mass lesion, or ventriculomegaly. Cervicomedullary junction and pituitary are within normal limits. Patchy and confluent bilateral cerebral white matter T2 and FLAIR hyperintensity is stable since 2018. No cortical encephalomalacia or chronic cerebral blood products identified. Deep gray matter nuclei are stable and within normal limits. Brainstem and cerebellum appear negative. No new signal abnormality identified. Vascular: Major intracranial vascular flow voids are stable. Skull and upper cervical spine: Negative visible cervical spine. Normal bone marrow signal. Sinuses/Orbits: Stable and negative. Other: Visible internal auditory structures appear normal. Mastoids are clear. There is a small volume of fluid layering in the nasopharynx. Scalp and face soft tissues appear negative. IMPRESSION: 1. Small, symmetric bilateral subdural CSF hygromas are new since the 2018 MRI, measuring 5 mm each. No midline shift and minimal associated intracranial mass effect. 2. No acute intracranial hemorrhage. No other acute intracranial abnormality. 3. Chronic nonspecific cerebral white matter signal changes. Electronically Signed   By: Genevie Ann M.D.   On: 04/11/2017 09:57   Portable Chest Xray  Result Date: 04/12/2017 CLINICAL DATA:  Endotracheal tube present EXAM: PORTABLE CHEST 1 VIEW COMPARISON:  Yesterday FINDINGS: Endotracheal tube tip just below the clavicular heads. An orogastric tube reaches the stomach with side port at the GE junction. Improved aeration. No visible effusion or pneumothorax. Borderline heart size. Artifact from EKG leads. IMPRESSION: 1. Unremarkable positioning of endotracheal and orogastric tubes. 2. Improved aeration. Electronically Signed   By: Monte Fantasia M.D.   On: 04/12/2017 07:06   Portable Chest X-ray  Result Date: 04/11/2017 CLINICAL DATA:  Endotracheal tube placement. EXAM: PORTABLE CHEST 1 VIEW COMPARISON:  Chest radiograph performed earlier today at 1:41 a.m. FINDINGS:  The patient's endotracheal tube is seen ending 3-4 cm above the carina. An enteric tube is noted extending below the diaphragm. Vascular congestion is noted. Right perihilar airspace opacity may reflect asymmetric interstitial edema, new from the prior study. No pleural effusion or pneumothorax is seen. The cardiomediastinal silhouette is normal in size. No acute osseous abnormalities are identified. IMPRESSION: 1. Endotracheal tube seen ending 3-4 cm above the carina. 2. Vascular congestion. Right perihilar airspace opacity is new from the prior study and may reflect asymmetric interstitial edema. Electronically Signed   By: Garald Balding M.D.   On: 04/11/2017 06:07   Dg Chest Portable 1 View  Result Date: 04/11/2017 CLINICAL DATA:  Status post seizure. EXAM: PORTABLE CHEST 1 VIEW COMPARISON:  Chest radiograph performed 07/24/2016 FINDINGS: The lungs are well-aerated and clear. There is no evidence of focal opacification, pleural effusion or pneumothorax. The cardiomediastinal silhouette is borderline normal in size. No acute osseous abnormalities are seen. IMPRESSION: No  acute cardiopulmonary process seen. Electronically Signed   By: Garald Balding M.D.   On: 04/11/2017 02:07    Assessment/Plan   ICD-10-CM   1. Accelerated hypertension I10    s/p recent Hypertensive emergency; remains uncontrolled  2. 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 DM (diabetes mellitus), type 2, uncontrolled, periph vascular complic (HCC) V07.46    A02.98   4. Acute basilic vein embolism, right I82.611   5. Dyslipidemia associated with type 2 diabetes mellitus (HCC) E11.69    E78.5   6. Seizure ([place]) R56.9   7. History of recent stroke Z86.73   8. Left carotid stenosis I65.22   9. Unspecified severe protein-calorie malnutrition ([place]) E43   10. CKD (chronic kidney disease) stage 3, GFR 30-59 ml/min (HCC) N18.3   11. Neurogenic bladder N31.9    likely 2/2  stroke; s/p foley cath    Change clonidine 0.60m TID via peg. Goal SBP 140-160  Change mattress to bariatric for better comfort for pt and reduce falls  Keep bed at lowest level  Fall precautions  Cont other meds as ordered  Peg tube care as ordered. Free water flushes as ordered  Foley cath care as indicated. Urology eval pending  PT/OT/ST as ordered  GOAL: short term rehab with potential for long term care. Communicated with pt, nursing, and family via conference call.  Family/ staff Communication:  Care plan mtg  Labs/tests ordered: none  Yussuf Sawyers S. CPerlie Gold PTexas Endoscopy Centers LLCand Adult Medicine [address] Glen [address] AvenueGOphiem [place] [address]([phone] (Monday-Friday 8 AM - 5 PM) ([phone] 5 PM and follow prompts

## 2017-05-15 NOTE — Telephone Encounter (Signed)
Attempted to call Richard Bennett back at number provided and no answer.  Will attempt later.  (07-03-17 with [name], PM appt).

## 2017-05-15 NOTE — Telephone Encounter (Signed)
Spoke to  [place] with [facility] to make 6 wk f/u with provider.  Ok per Dr. Leta Baptist to see NP, [name]. Appt made 07-03-17 at 1515. (arrival 1245).

## 2017-05-15 NOTE — Telephone Encounter (Signed)
Could not LM (relayed to call back later).

## 2017-05-16 ENCOUNTER — Non-Acute Institutional Stay (SKILLED_NURSING_FACILITY): Payer: Medicare HMO | Admitting: Adult Health

## 2017-05-16 ENCOUNTER — Encounter: Payer: Self-pay | Admitting: Adult Health

## 2017-05-16 DIAGNOSIS — E1165 Type 2 diabetes mellitus with hyperglycemia: Secondary | ICD-10-CM

## 2017-05-16 DIAGNOSIS — E1159 Type 2 diabetes mellitus with other circulatory complications: Secondary | ICD-10-CM

## 2017-05-16 DIAGNOSIS — E1151 Type 2 diabetes mellitus with diabetic peripheral angiopathy without gangrene: Secondary | ICD-10-CM | POA: Diagnosis not present

## 2017-05-16 DIAGNOSIS — I10 Essential (primary) hypertension: Secondary | ICD-10-CM

## 2017-05-16 DIAGNOSIS — IMO0002 Reserved for concepts with insufficient information to code with codable children: Secondary | ICD-10-CM

## 2017-05-16 NOTE — Progress Notes (Signed)
Location:   [facility] Room Number: [place] of Service:  SNF (31)   CODE STATUS: Full Code  No Known Allergies  Chief Complaint  Patient presents with  . Acute Visit    Diabetes Mellitus, Hypertension    HPI:  He has had elevated blood pressure readings; with normal readings as well. His cbg readings are elevated.  There are no reports of headaches chest pain. There are no reports of missed medications. There are no reports of excessive hunger present or thirst present.   Past Medical History:  Diagnosis Date  . Chest pain   . Colon polyps    adenomatous  . Hyperlipidemia   . Hypertension   . Refusal of blood transfusions as patient is Jehovah's Witness   . Stroke ([place])   . Type II diabetes mellitus ([place])     Past Surgical History:  Procedure Laterality Date  . APPENDECTOMY    . BACK SURGERY    . IR GASTROSTOMY TUBE MOD SED  04/29/2017  . LUMBAR DISC SURGERY     "herniated"    Social History   Socioeconomic History  . Marital status: Divorced    Spouse name: Not on file  . Number of children: 4  . Years of education: 50  . Highest education level: Not on file  Social Needs  . Financial resource strain: Not on file  . Food insecurity - worry: Not on file  . Food insecurity - inability: Not on file  . Transportation needs - medical: Not on file  . Transportation needs - non-medical: Not on file  Occupational History  . Occupation: Truck - Local  Tobacco Use  . Smoking status: Former Smoker    Packs/day: 0.50    Years: 20.00    Pack years: 10.00    Types: Cigarettes    Last attempt to quit: 03/04/1978    Years since quitting: 39.2  . Smokeless tobacco: Never Used  Substance and Sexual Activity  . Alcohol use: Yes    Comment: 10/30/2015 "might have 2-3 drinks/year"  . Drug use: Yes    Types: Heroin    Comment: 10/30/2015 "nothing in the 2000s"  . Sexual activity: No  Other Topics Concern  . Not on file  Social History Narrative     Fun: Cycling    Lives alone.  Is retired. Education 12 th grade.  Single.  Children - 4.  He is Jehovah witness.    Family History  Problem Relation Age of Onset  . Diabetes Mother   . Stroke Mother   . Cancer Father   . Diabetes Brother   . Seizures Brother   . Colon cancer Neg Hx   . Esophageal cancer Neg Hx   . Stomach cancer Neg Hx   . Rectal cancer Neg Hx       VITAL SIGNS BP (!) 184/92   Pulse 80   Temp 98.8 F (37.1 C)   Resp 20   Ht 5' 8"  (1.727 m)   Wt 172 lb 4.8 oz (78.2 kg)   SpO2 93%   BMI 26.20 kg/m   Outpatient Encounter Medications as of 05/16/2017  Medication Sig  . acetaminophen (TYLENOL) 160 MG/5ML solution Place 20.3 mLs (650 mg total) into feeding tube every 6 (six) hours as needed for mild pain or fever.  Marland Kitchen alum & mag hydroxide-simeth (MAALOX/MYLANTA) 200-200-20 MG/5ML suspension Take 15 mLs by mouth every 4 (four) hours as needed for indigestion or heartburn.  Marland Kitchen  amLODipine (NORVASC) 10 MG tablet Take 10 mg by mouth daily.  Marland Kitchen aspirin 325 MG tablet Place 1 tablet (325 mg total) into feeding tube daily.  Marland Kitchen atorvastatin (LIPITOR) 40 MG tablet Place 1 tablet (40 mg total) into feeding tube daily at 6 PM.  . bisacodyl (DULCOLAX) 10 MG suppository Place 1 suppository (10 mg total) rectally daily as needed for moderate constipation.  . cloNIDine (CATAPRES) 0.3 MG tablet Take 0.3 mg by mouth 3 (three) times daily.  Marland Kitchen docusate (COLACE) 50 MG/5ML liquid Place 10 mLs (100 mg total) into feeding tube 2 (two) times daily as needed for mild constipation.  . folic acid (FOLVITE) 1 MG tablet Place 1 tablet (1 mg total) into feeding tube daily.  . insulin aspart (NOVOLOG FLEXPEN) 100 UNIT/ML FlexPen Inject 5 Units into the skin. After meals  . insulin glargine (LANTUS) 100 UNIT/ML injection Inject 0.18 mLs (18 Units total) into the skin daily.  Marland Kitchen labetalol (NORMODYNE) 200 MG tablet Place 1 tablet (200 mg total) into feeding tube every 8 (eight) hours.  .  levETIRAcetam (KEPPRA) 100 MG/ML solution Place 5 mLs (500 mg total) into feeding tube 2 (two) times daily.  . Multiple Vitamin (MULTIVITAMIN WITH MINERALS) TABS tablet Take 1 tablet by mouth daily.  . Nutritional Supplements (NUTRITIONAL SUPPLEMENT PO) Low Concentrated Sweets Diet -  Pureed texture, honey thickened Fluids consistency  . Nutritional Supplements (PROMOD) LIQD Give 30 cc by mouth three times daily for low albumin  . polyethylene glycol (MIRALAX / GLYCOLAX) packet Place 17 g into feeding tube daily as needed for severe constipation.  . thiamine 100 MG tablet Place 1 tablet (100 mg total) into feeding tube daily.  . Water For Irrigation, Sterile (FREE WATER) SOLN Place 400 mLs into feeding tube every 6 (six) hours.  . [DISCONTINUED] cloNIDine (CATAPRES) 0.2 MG tablet Place 1 tablet (0.2 mg total) into feeding tube 3 (three) times daily. (Patient not taking: Reported on 05/16/2017)   No facility-administered encounter medications on file as of 05/16/2017.      SIGNIFICANT DIAGNOSTIC EXAMS  PREVIOUS:   04-11-17: chest x-ray: 1. Endotracheal tube seen ending 3-4 cm above the carina. 2. Vascular congestion. Right perihilar airspace opacity is new from the prior study and may reflect asymmetric interstitial edema.  04-11-17: ct of head and cervical spine: 1. No CT evidence for acute intracranial abnormality. Atrophy and small vessel ischemic changes of the white matter. 2. Straightening of the cervical spine. No definite acute osseous Abnormality.  04-11-17: MRI brain: 1. Small, symmetric bilateral subdural CSF hygromas are new since the 2018 MRI, measuring 5 mm each. No midline shift and minimal associated intracranial mass effect. 2. No acute intracranial hemorrhage. No other acute intracranial abnormality. 3. Chronic nonspecific cerebral white matter signal changes.  04-13-17: ct of head: 1. Progressive white matter hypoattenuation in the posterior left frontal lobe is concerning for  infarct. Infection is considered less likely. There may be some cortical involvement is well. The changes are mostly subcortical. 2. Stable white matter disease on the right. 3. Bilateral subdural hygromas with increased since the previous CT scan.  04-14-17: carotid doppler:  Right Carotid: Elevated common carotid artery velocities, along with plaque morphology suggest hemodynamically significant stenosis versus  proximal obstruction. Elevated right internal carotid artery velocities suggest low range 40-59% stenosis, however this is likely due to proximal obstruction given lack of significant plaque morphology. Left Carotid: Elevated common carotid artery velocities, along with significant plaque morphology is suggestive of >50% stenosis. This  is consistent with previous findings by CTA and ultrasound. Elevated left internal carotid artery velocities suggest low range 60-79% stenosis, however this may be due to proximal obstruction given lack of significant plaque morphology. Vertebrals:  Right vertebral artery was patent with antegrade flow. Left vertebral artery exhibits atypical waveform, suggestive of possible proximal obstruction. Subclavians: Left subclavian artery was moderately stenotic. Normal flow hemodynamics were seen in the right subclavian artery. Correlate with CT angio if clinically necessary  04-16-17 MRA head: 1. Positive for mild to moderate irregularity and stenosis of multiple left MCA M2 and M3 branches, but negative for left MCA branch occlusion. 2. Otherwise negative intracranial MRA.  04-16-17: renal ultrasound: 1. No hydronephrosis. Increased renal parenchymal echogenicity is consistent with chronic medical renal disease. 2. Incidentally visualized small pleural effusions.  04-16-17: MRI brain: New areas of acute infarct bilaterally, left greater than right. These appear to be watershed infarcts bilaterally. Negative for hemorrhage.  05-15-17: 2-d echo:   - Left ventricle:  The cavity size was normal. Wall thickness was increased in a pattern of moderate LVH. There was severe concentric hypertrophy. Systolic function was normal. The estimated ejection fraction was in the range of 60% to 65%. Wall motion was normal; there were no regional wall motion abnormalities. Doppler parameters are consistent with abnormal left ventricular relaxation (grade 1 diastolic dysfunction). The E/e&' ratio is between 8-15, suggesting indeterminate LV filling pressure. - Aortic valve: Trileaflet. There was no stenosis. There was no regurgitation. - Mitral valve: Mildly thickened leaflets . There was mild regurgitation. - Left atrium: The atrium was normal in size. - Inferior vena cava: The vessel was dilated. The respirophasic diameter changes were blunted (< 50%), consistent with elevated central venous pressure. On ventilator.   05-02-17: right upper extremity doppler: No evidence of obstruction was seen in the central veins. Evidence of acute superficial venous thrombosis in the basilic vein.   04-22-17: chest x-ray: Cardiomegaly. No active disease.   04-29-17: peg tube placed  05-02-17: swallow study: Continue NPO recommended and speech therapy to improve oral coordination and ability to transfer boluses.   05-05-17: ct of abdomen and pelvis: 1. Small amount of nonspecific perisplenic fluid. 2. Otherwise, no acute abdominal or pelvic pathology. 3. Small bilateral pleural effusion with dependent atelectasis. 4.  Aortic Atherosclerosis (ICD10-I70.0). 5. Gastrostomy tube in satisfactory position.  05-06-17: chest x-ray: Mild hypoinflation with patchy bibasilar opacities which may reflect pneumonia or atelectasis.   NO NEW EXAMS    LABS REVIEWED PREVIOUS:   04-11-17: wbc 5.5; hgb 11.0; hct 33.5; mcv 81.7; plt 214; glucose 715; bun 21; creat 2.71; k+ 3.5; na++ 135; ca 8.2; ast 52; albumin 2.8; ammonia 52; mag 1.8; phos 2.2; trig 87; blood culture: no growth; urine drug screen: + cocaine +  benzo 04-12-17: dilantin 18.7 04-15-17: wbc 7.7; hgb 9.3; hct 30.0; mcv 85.7; plt 194; glucose 281; bun 41; creat 3.19; k+ 5.7; na++ 146; ca 8.1; mag 1.8 04-17-17: wbc 8.5; hgb 8.0; hct 25.7; mcv 84.5; plt 195; glucose 86; bun 60; creat 3.28; k+ 4.4; na++ 148; albumin 1.8; phos 3.8; chol 172; ldl 111; trig 98; hdl 41; hgb a1c 11.8 04-20-17: wbc 5.5; hgb 7.3; hct 22.8; mcv 82.9; plt 209; glucose 97; bun 71; creat 3.11; k+ 3.5; na++ 140; ca 8.2   04-23-17: wbc 10.0; hgb 7.5; hct 24.0; mcv 83.9; plt 248; glucose 197; bun 106; creat 3.31; k+ 4.1; na++ 140; ca 8.5; mag 2.3 04-27-17: wbc 8.5; hgb 8.0; hct 25.6; mcv 85.9; plt  282; glucose 178; bun 113; creat 2.71; k+ 3.9; na++ 141; ca 7.5; phos 4.7; albumin 2.2 04-28-17: vit B 12: 894; iron 49; tibc 286; folate 22.9; ferritin 276; mag 2.7  04-30-17: glucose 200; bun 71; creat 2.43; k+ 4.5; na++ 1152; ca 8.5; ast 46; alk phos 214; albumin 2.1  05-04-17: wbc 8.8; hgb 7.8; hct 25.1; mcv 85.4; plt 248; glucose 172; bun 61; creat 2.01; k+ 4.3; na++ 144; ca 8.3 05-06-17: wbc 15.0; hgb 7.7; hct 24.8; mcv 84.6; plt 216; glucose 232; bun 56; creat 2.31; k+ 5.0; na++ 140; ca 8.2; alk phos 205; albumin 1.9 05-11-17; wbc 7.7; hgb 7.4; hct 23.5; mcv 82.2; plt 228; glucose 434; bun 60; creat 3.12; k+ 3.5; na++ 142; ca 7.8; mag 2.3 hgb a1c 10.6; chol 124; ldl 65; trig 212; hdl 17  05-13-17: wbc 8.2; hgb 7.8; hct 24.3; mcv 82.4; plt 255; glucose 172; bun 49; creat 2.88; k+ 3.8; na++ 144; ca 7.8   NO NEW LABS    Review of Systems  Unable to perform ROS: Other (expressive aphasia )    Physical Exam  Constitutional: He appears well-developed and well-nourished. No distress.  Overweight   Neck: No thyromegaly present.  Cardiovascular: Normal rate, regular rhythm and intact distal pulses.  Murmur heard. 1/6  Pulmonary/Chest: Effort normal and breath sounds normal. No respiratory distress.  Abdominal: Soft. Bowel sounds are normal. He exhibits no distension. There is no  tenderness.  Peg tube present without signs of infection present   Genitourinary:  Genitourinary Comments: Has foley   Musculoskeletal: He exhibits no edema.  Right side is flaccid Left side is weak    Lymphadenopathy:    He has no cervical adenopathy.  Neurological: He is alert.  Skin: Skin is warm and dry. He is not diaphoretic.  Psychiatric: He has a normal mood and affect.     ASSESSMENT/ PLAN:  TODAY:   1. Hypertension associated with diabetes: without change b/p: 184/92 will continue norvasc 10 mg daily; clonidine 0.3 mg three times daily; labetolol 200 mg every 8 hours; will begin hydralazine 10 mg every 6 hours as needed for b/p >=190   his blood pressure goal is 140-160 due to his bilateral carotid stenosis.   6. Diabetes type 2 uncontrolled, peripheral vascular complications: hgb G9J 24.2: without change: will continue  lantus 18 units nightly   Will increase novolog to 6 units after meals. and will monitor his status; he is on asa and statin; not on ace/arb due to renal function.      MD is aware of resident's narcotic use and is in agreement with current plan of care. We will attempt to wean resident as apropriate    Ok Edwards NP [facility]  Contact [phone] Monday through Friday 8am- 5pm  After hours call [phone]

## 2017-05-17 ENCOUNTER — Inpatient Hospital Stay (HOSPITAL_COMMUNITY)
Admission: EM | Admit: 2017-05-17 | Discharge: 2017-05-21 | DRG: 193 | Disposition: A | Discharge status: Hospice | Payer: Medicare HMO | Attending: Family Medicine | Admitting: Family Medicine

## 2017-05-17 ENCOUNTER — Inpatient Hospital Stay (HOSPITAL_COMMUNITY): Payer: Medicare HMO

## 2017-05-17 ENCOUNTER — Emergency Department (HOSPITAL_COMMUNITY): Payer: Medicare HMO

## 2017-05-17 ENCOUNTER — Encounter (HOSPITAL_COMMUNITY): Payer: Self-pay

## 2017-05-17 DIAGNOSIS — Y95 Nosocomial condition: Secondary | ICD-10-CM | POA: Diagnosis present

## 2017-05-17 DIAGNOSIS — N183 Chronic kidney disease, stage 3 unspecified: Secondary | ICD-10-CM | POA: Diagnosis present

## 2017-05-17 DIAGNOSIS — Z66 Do not resuscitate: Secondary | ICD-10-CM | POA: Diagnosis present

## 2017-05-17 DIAGNOSIS — R569 Unspecified convulsions: Secondary | ICD-10-CM

## 2017-05-17 DIAGNOSIS — Z7189 Other specified counseling: Secondary | ICD-10-CM | POA: Diagnosis not present

## 2017-05-17 DIAGNOSIS — J9601 Acute respiratory failure with hypoxia: Secondary | ICD-10-CM | POA: Diagnosis present

## 2017-05-17 DIAGNOSIS — E878 Other disorders of electrolyte and fluid balance, not elsewhere classified: Secondary | ICD-10-CM | POA: Diagnosis present

## 2017-05-17 DIAGNOSIS — Z86718 Personal history of other venous thrombosis and embolism: Secondary | ICD-10-CM

## 2017-05-17 DIAGNOSIS — E1159 Type 2 diabetes mellitus with other circulatory complications: Secondary | ICD-10-CM | POA: Diagnosis present

## 2017-05-17 DIAGNOSIS — E785 Hyperlipidemia, unspecified: Secondary | ICD-10-CM | POA: Diagnosis present

## 2017-05-17 DIAGNOSIS — Z931 Gastrostomy status: Secondary | ICD-10-CM

## 2017-05-17 DIAGNOSIS — Z87891 Personal history of nicotine dependence: Secondary | ICD-10-CM

## 2017-05-17 DIAGNOSIS — F1410 Cocaine abuse, uncomplicated: Secondary | ICD-10-CM | POA: Diagnosis present

## 2017-05-17 DIAGNOSIS — E1169 Type 2 diabetes mellitus with other specified complication: Secondary | ICD-10-CM | POA: Diagnosis present

## 2017-05-17 DIAGNOSIS — E11649 Type 2 diabetes mellitus with hypoglycemia without coma: Secondary | ICD-10-CM

## 2017-05-17 DIAGNOSIS — J181 Lobar pneumonia, unspecified organism: Principal | ICD-10-CM | POA: Diagnosis present

## 2017-05-17 DIAGNOSIS — N179 Acute kidney failure, unspecified: Secondary | ICD-10-CM | POA: Diagnosis present

## 2017-05-17 DIAGNOSIS — G9341 Metabolic encephalopathy: Secondary | ICD-10-CM | POA: Diagnosis present

## 2017-05-17 DIAGNOSIS — D638 Anemia in other chronic diseases classified elsewhere: Secondary | ICD-10-CM | POA: Diagnosis present

## 2017-05-17 DIAGNOSIS — I69391 Dysphagia following cerebral infarction: Secondary | ICD-10-CM | POA: Diagnosis not present

## 2017-05-17 DIAGNOSIS — Z823 Family history of stroke: Secondary | ICD-10-CM

## 2017-05-17 DIAGNOSIS — Z515 Encounter for palliative care: Secondary | ICD-10-CM | POA: Diagnosis present

## 2017-05-17 DIAGNOSIS — Z981 Arthrodesis status: Secondary | ICD-10-CM

## 2017-05-17 DIAGNOSIS — E872 Acidosis: Secondary | ICD-10-CM | POA: Diagnosis present

## 2017-05-17 DIAGNOSIS — I6330 Cerebral infarction due to thrombosis of unspecified cerebral artery: Secondary | ICD-10-CM | POA: Diagnosis present

## 2017-05-17 DIAGNOSIS — E1165 Type 2 diabetes mellitus with hyperglycemia: Secondary | ICD-10-CM | POA: Diagnosis not present

## 2017-05-17 DIAGNOSIS — E43 Unspecified severe protein-calorie malnutrition: Secondary | ICD-10-CM | POA: Diagnosis present

## 2017-05-17 DIAGNOSIS — E870 Hyperosmolality and hypernatremia: Secondary | ICD-10-CM | POA: Diagnosis present

## 2017-05-17 DIAGNOSIS — G40901 Epilepsy, unspecified, not intractable, with status epilepticus: Secondary | ICD-10-CM | POA: Diagnosis present

## 2017-05-17 DIAGNOSIS — E871 Hypo-osmolality and hyponatremia: Secondary | ICD-10-CM | POA: Diagnosis not present

## 2017-05-17 DIAGNOSIS — I129 Hypertensive chronic kidney disease with stage 1 through stage 4 chronic kidney disease, or unspecified chronic kidney disease: Secondary | ICD-10-CM | POA: Diagnosis present

## 2017-05-17 DIAGNOSIS — J189 Pneumonia, unspecified organism: Secondary | ICD-10-CM | POA: Diagnosis not present

## 2017-05-17 DIAGNOSIS — Z7982 Long term (current) use of aspirin: Secondary | ICD-10-CM | POA: Diagnosis not present

## 2017-05-17 DIAGNOSIS — G40909 Epilepsy, unspecified, not intractable, without status epilepticus: Secondary | ICD-10-CM | POA: Diagnosis present

## 2017-05-17 DIAGNOSIS — E1122 Type 2 diabetes mellitus with diabetic chronic kidney disease: Secondary | ICD-10-CM | POA: Diagnosis present

## 2017-05-17 DIAGNOSIS — D649 Anemia, unspecified: Secondary | ICD-10-CM

## 2017-05-17 DIAGNOSIS — G9340 Encephalopathy, unspecified: Secondary | ICD-10-CM | POA: Diagnosis not present

## 2017-05-17 DIAGNOSIS — I69320 Aphasia following cerebral infarction: Secondary | ICD-10-CM | POA: Diagnosis not present

## 2017-05-17 DIAGNOSIS — I69351 Hemiplegia and hemiparesis following cerebral infarction affecting right dominant side: Secondary | ICD-10-CM

## 2017-05-17 DIAGNOSIS — Z833 Family history of diabetes mellitus: Secondary | ICD-10-CM

## 2017-05-17 DIAGNOSIS — Z794 Long term (current) use of insulin: Secondary | ICD-10-CM

## 2017-05-17 DIAGNOSIS — IMO0002 Reserved for concepts with insufficient information to code with codable children: Secondary | ICD-10-CM | POA: Diagnosis present

## 2017-05-17 DIAGNOSIS — E1151 Type 2 diabetes mellitus with diabetic peripheral angiopathy without gangrene: Secondary | ICD-10-CM | POA: Diagnosis not present

## 2017-05-17 DIAGNOSIS — R509 Fever, unspecified: Secondary | ICD-10-CM

## 2017-05-17 DIAGNOSIS — F1310 Sedative, hypnotic or anxiolytic abuse, uncomplicated: Secondary | ICD-10-CM | POA: Diagnosis present

## 2017-05-17 DIAGNOSIS — I10 Essential (primary) hypertension: Secondary | ICD-10-CM

## 2017-05-17 DIAGNOSIS — Z6826 Body mass index (BMI) 26.0-26.9, adult: Secondary | ICD-10-CM

## 2017-05-17 LAB — CBC WITH DIFFERENTIAL/PLATELET
BASOS ABS: 0 10*3/uL (ref 0.0–0.1)
Basophils Relative: 0 %
EOS ABS: 0 10*3/uL (ref 0.0–0.7)
Eosinophils Relative: 0 %
HCT: 28.1 % — ABNORMAL LOW (ref 39.0–52.0)
HEMOGLOBIN: 8.9 g/dL — AB (ref 13.0–17.0)
Lymphocytes Relative: 4 %
Lymphs Abs: 0.9 10*3/uL (ref 0.7–4.0)
MCH: 26.3 pg (ref 26.0–34.0)
MCHC: 31.7 g/dL (ref 30.0–36.0)
MCV: 82.9 fL (ref 78.0–100.0)
MONOS PCT: 6 %
Monocytes Absolute: 1.3 10*3/uL — ABNORMAL HIGH (ref 0.1–1.0)
Neutro Abs: 19.2 10*3/uL — ABNORMAL HIGH (ref 1.7–7.7)
Neutrophils Relative %: 90 %
PLATELETS: 379 10*3/uL (ref 150–400)
RBC: 3.39 MIL/uL — AB (ref 4.22–5.81)
RDW: 15 % (ref 11.5–15.5)
WBC Morphology: INCREASED
WBC: 21.4 10*3/uL — AB (ref 4.0–10.5)

## 2017-05-17 LAB — BLOOD GAS, ARTERIAL
Acid-base deficit: 5.9 mmol/L — ABNORMAL HIGH (ref 0.0–2.0)
Bicarbonate: 17 mmol/L — ABNORMAL LOW (ref 20.0–28.0)
DRAWN BY: 308601
FIO2: 21
O2 Saturation: 88.4 %
PATIENT TEMPERATURE: 98.6
pCO2 arterial: 25.2 mmHg — ABNORMAL LOW (ref 32.0–48.0)
pH, Arterial: 7.443 (ref 7.350–7.450)
pO2, Arterial: 54.1 mmHg — ABNORMAL LOW (ref 83.0–108.0)

## 2017-05-17 LAB — URINALYSIS, ROUTINE W REFLEX MICROSCOPIC
BILIRUBIN URINE: NEGATIVE
GLUCOSE, UA: NEGATIVE mg/dL
Ketones, ur: NEGATIVE mg/dL
Leukocytes, UA: NEGATIVE
NITRITE: POSITIVE — AB
PH: 5.5 (ref 5.0–8.0)
Protein, ur: 300 mg/dL — AB
Squamous Epithelial / LPF: NONE SEEN

## 2017-05-17 LAB — COMPREHENSIVE METABOLIC PANEL
ALBUMIN: 2.3 g/dL — AB (ref 3.5–5.0)
ALT: 38 U/L (ref 17–63)
ANION GAP: 10 (ref 5–15)
AST: 68 U/L — ABNORMAL HIGH (ref 15–41)
Alkaline Phosphatase: 327 U/L — ABNORMAL HIGH (ref 38–126)
BUN: 27 mg/dL — ABNORMAL HIGH (ref 6–20)
CALCIUM: 8.9 mg/dL (ref 8.9–10.3)
CHLORIDE: 117 mmol/L — AB (ref 101–111)
CO2: 21 mmol/L — ABNORMAL LOW (ref 22–32)
Creatinine, Ser: 2.09 mg/dL — ABNORMAL HIGH (ref 0.61–1.24)
GFR calc non Af Amer: 31 mL/min — ABNORMAL LOW (ref >60)
GFR, EST AFRICAN AMERICAN: 36 mL/min — AB (ref >60)
GLUCOSE: 69 mg/dL (ref 65–99)
POTASSIUM: 4 mmol/L (ref 3.5–5.1)
SODIUM: 148 mmol/L — AB (ref 135–145)
Total Bilirubin: 0.4 mg/dL (ref 0.3–1.2)
Total Protein: 7 g/dL (ref 6.5–8.1)

## 2017-05-17 LAB — INFLUENZA PANEL BY PCR (TYPE A & B)
Influenza A By PCR: NEGATIVE
Influenza B By PCR: NEGATIVE

## 2017-05-17 LAB — GLUCOSE, CAPILLARY: GLUCOSE-CAPILLARY: 141 mg/dL — AB (ref 65–99)

## 2017-05-17 LAB — CBG MONITORING, ED
GLUCOSE-CAPILLARY: 53 mg/dL — AB (ref 65–99)
GLUCOSE-CAPILLARY: 168 mg/dL — AB (ref 65–99)
GLUCOSE-CAPILLARY: 136 mg/dL — AB (ref 65–99)
Glucose-Capillary: 133 mg/dL — ABNORMAL HIGH (ref 65–99)
Glucose-Capillary: 133 mg/dL — ABNORMAL HIGH (ref 65–99)

## 2017-05-17 LAB — I-STAT CG4 LACTIC ACID, ED: LACTIC ACID, VENOUS: 1.64 mmol/L (ref 0.5–1.9)

## 2017-05-17 MED ORDER — FOLIC ACID 1 MG PO TABS
1.0000 mg | ORAL_TABLET | Freq: Every day | ORAL | Status: DC
  Administered 2017-05-18 – 2017-05-20 (×3): 1 mg
  Filled 2017-05-17 (×3): qty 1

## 2017-05-17 MED ORDER — DEXTROSE 50 % IV SOLN: 25.0000 g | Freq: Once | INTRAVENOUS | Status: DC

## 2017-05-17 MED ORDER — ENOXAPARIN SODIUM 40 MG/0.4ML ~~LOC~~ SOLN
40.0000 mg | Freq: Every day | SUBCUTANEOUS | Status: DC
  Administered 2017-05-17 – 2017-05-18 (×2): 40 mg via SUBCUTANEOUS
  Filled 2017-05-17 (×2): qty 0.4

## 2017-05-17 MED ORDER — SODIUM CHLORIDE 0.9 % IV SOLN
1.0000 g | Freq: Two times a day (BID) | INTRAVENOUS | Status: DC
  Administered 2017-05-17: 1 g via INTRAVENOUS
  Filled 2017-05-17 (×2): qty 1

## 2017-05-17 MED ORDER — VANCOMYCIN HCL 500 MG IV SOLR
500.0000 mg | Freq: Once | INTRAVENOUS | Status: AC
  Administered 2017-05-17: 500 mg via INTRAVENOUS
  Filled 2017-05-17: qty 500

## 2017-05-17 MED ORDER — ACETAMINOPHEN 160 MG/5ML PO SOLN
650.0000 mg | Freq: Four times a day (QID) | ORAL | Status: DC | PRN
Start: 2017-05-17 — End: 2017-05-20
  Administered 2017-05-18 – 2017-05-20 (×3): 650 mg
  Filled 2017-05-17 (×3): qty 20.3

## 2017-05-17 MED ORDER — INSULIN ASPART 100 UNIT/ML ~~LOC~~ SOLN
0.0000 [IU] | Freq: Three times a day (TID) | SUBCUTANEOUS | Status: DC
  Administered 2017-05-18 (×2): 2 [IU] via SUBCUTANEOUS

## 2017-05-17 MED ORDER — BISACODYL 10 MG RE SUPP
10.0000 mg | Freq: Every day | RECTAL | Status: DC | PRN
Start: 2017-05-17 — End: 2017-05-20

## 2017-05-17 MED ORDER — VANCOMYCIN HCL IN DEXTROSE 1-5 GM/200ML-% IV SOLN
1000.0000 mg | Freq: Once | INTRAVENOUS | Status: AC
  Administered 2017-05-17: 1000 mg via INTRAVENOUS
  Filled 2017-05-17: qty 200

## 2017-05-17 MED ORDER — LABETALOL HCL 200 MG PO TABS
200.0000 mg | ORAL_TABLET | Freq: Three times a day (TID) | ORAL | Status: DC
  Administered 2017-05-17 – 2017-05-20 (×9): 200 mg
  Filled 2017-05-17 (×9): qty 1

## 2017-05-17 MED ORDER — CLONIDINE HCL 0.2 MG PO TABS
0.3000 mg | ORAL_TABLET | Freq: Three times a day (TID) | ORAL | Status: DC
  Administered 2017-05-17 – 2017-05-19 (×5): 0.3 mg via ORAL
  Filled 2017-05-17 (×5): qty 1

## 2017-05-17 MED ORDER — PIPERACILLIN-TAZOBACTAM 3.375 G IVPB 30 MIN
3.3750 g | Freq: Once | INTRAVENOUS | Status: AC
  Administered 2017-05-17: 3.375 g via INTRAVENOUS
  Filled 2017-05-17: qty 50

## 2017-05-17 MED ORDER — DOCUSATE SODIUM 50 MG/5ML PO LIQD
100.0000 mg | Freq: Two times a day (BID) | ORAL | Status: DC | PRN
  Filled 2017-05-17: qty 10

## 2017-05-17 MED ORDER — VANCOMYCIN HCL 10 G IV SOLR: 1500.0000 mg | INTRAVENOUS | Status: DC

## 2017-05-17 MED ORDER — POLYETHYLENE GLYCOL 3350 17 G PO PACK: 17.0000 g | PACK | Freq: Every day | ORAL | Status: DC | PRN

## 2017-05-17 MED ORDER — ALUM & MAG HYDROXIDE-SIMETH 200-200-20 MG/5ML PO SUSP: 15.0000 mL | ORAL | Status: DC | PRN

## 2017-05-17 MED ORDER — ATORVASTATIN CALCIUM 40 MG PO TABS
40.0000 mg | ORAL_TABLET | Freq: Every day | ORAL | Status: DC
  Administered 2017-05-18 – 2017-05-19 (×2): 40 mg
  Filled 2017-05-17 (×2): qty 1

## 2017-05-17 MED ORDER — SODIUM CHLORIDE 0.9 % IV BOLUS (SEPSIS)
500.0000 mL | Freq: Once | INTRAVENOUS | Status: AC
  Administered 2017-05-17: 500 mL via INTRAVENOUS

## 2017-05-17 MED ORDER — FREE WATER
400.0000 mL | Freq: Four times a day (QID) | Status: DC
  Administered 2017-05-17 – 2017-05-20 (×10): 400 mL

## 2017-05-17 MED ORDER — LEVETIRACETAM 100 MG/ML PO SOLN
500.0000 mg | Freq: Two times a day (BID) | ORAL | Status: DC
  Administered 2017-05-17 – 2017-05-20 (×6): 500 mg
  Filled 2017-05-17 (×6): qty 5

## 2017-05-17 MED ORDER — DEXTROSE 50 % IV SOLN
INTRAVENOUS | Status: AC
  Administered 2017-05-17: 50 mL
  Filled 2017-05-17: qty 50

## 2017-05-17 MED ORDER — ASPIRIN 325 MG PO TABS
325.0000 mg | ORAL_TABLET | Freq: Every day | ORAL | Status: DC
Start: 2017-05-18 — End: 2017-05-20
  Administered 2017-05-18 – 2017-05-20 (×3): 325 mg
  Filled 2017-05-17 (×3): qty 1

## 2017-05-17 MED ORDER — VITAMIN B-1 100 MG PO TABS
100.0000 mg | ORAL_TABLET | Freq: Every day | ORAL | Status: DC
  Administered 2017-05-18 – 2017-05-20 (×3): 100 mg
  Filled 2017-05-17 (×3): qty 1

## 2017-05-17 NOTE — H&P (Addendum)
History and Physical    Richard Bennett ZDG:[id number] DOB: 03-22-1950 DOA: 05/17/2017  PCP: [name], MD  Patient coming from: SNF.   I have personally briefly reviewed patient's old medical records in [place]  Chief Complaint:   HPI: Richard Bennett is a 67 y.o. male with medical history significant of recent  Bilateral periventricular and watershed area CVA'S, with right flaccid paralysis, anemia of chronic disease, mod malnutrition, bilateral carotid stenosis, hypertension. Seizures, diabetes mellitus, right sided basilic non occlusive DVT, encephalopathic, cocaine abuse, hyperlipidemia, Jehovah's witness,  Brought from SNF for hypoglycemia and hypoxia. Patient is currently on NRB to keep sats >90's, encephalopathic, afebrile and hypertensive. Lab work revealed elevated wbc count, hemoglobin of 8.9, creatinine of 2.09 , BUN of 27. He was referrd to med service for admission for RLL pneumonia evident on CXR.    Review of Systems: PT CONFUSED, somnolent and not oriented. ROS COULD NOT BE OBTAINED.    Past Medical History:  Diagnosis Date  . Chest pain   . Colon polyps    adenomatous  . Hyperlipidemia   . Hypertension   . Refusal of blood transfusions as patient is Jehovah's Witness   . Stroke ([place])   . Type II diabetes mellitus ([place])     Past Surgical History:  Procedure Laterality Date  . APPENDECTOMY    . BACK SURGERY    . IR GASTROSTOMY TUBE MOD SED  04/29/2017  . LUMBAR DISC SURGERY     "herniated"     reports that he quit smoking about 39 years ago. His smoking use included cigarettes. He has a 10.00 pack-year smoking history. he has never used smokeless tobacco. He reports that he drinks alcohol. He reports that he uses drugs. Drug: Heroin.  No Known Allergies  Family History  Problem Relation Age of Onset  . Diabetes Mother   . Stroke Mother   . Cancer Father   . Diabetes Brother   . Seizures Brother   . Colon cancer Neg Hx   . Esophageal cancer  Neg Hx   . Stomach cancer Neg Hx   . Rectal cancer Neg Hx    Further family history couldn't be obtained.   Prior to Admission medications   Medication Sig Start Date End Date Taking? Authorizing Provider  acetaminophen (TYLENOL) 160 MG/5ML solution Place 20.3 mLs (650 mg total) into feeding tube every 6 (six) hours as needed for mild pain or fever. 05/13/17  Yes [name], MD  alum & mag hydroxide-simeth (MAALOX/MYLANTA) 200-200-20 MG/5ML suspension Take 15 mLs by mouth every 4 (four) hours as needed for indigestion or heartburn. 05/13/17  Yes [name], MD  amLODipine (NORVASC) 10 MG tablet Take 10 mg by mouth daily.   Yes [provider]  aspirin 325 MG tablet Place 1 tablet (325 mg total) into feeding tube daily. 05/14/17  Yes [name], MD  atorvastatin (LIPITOR) 40 MG tablet Place 1 tablet (40 mg total) into feeding tube daily at 6 PM. 05/13/17  Yes McClung, Kimberlee Nearing, MD  bisacodyl (DULCOLAX) 10 MG suppository Place 1 suppository (10 mg total) rectally daily as needed for moderate constipation. 05/13/17  Yes [name], MD  cloNIDine (CATAPRES) 0.3 MG tablet Take 0.3 mg by mouth 3 (three) times daily.   Yes [provider]  docusate (COLACE) 50 MG/5ML liquid Place 10 mLs (100 mg total) into feeding tube 2 (two) times daily as needed for mild constipation. 05/13/17  Yes [name],  MD  folic acid (FOLVITE) 1 MG tablet Place 1 tablet (1 mg total) into feeding tube daily. 05/14/17  Yes [name], MD  hydrALAZINE (APRESOLINE) 10 MG tablet Take 10 mg by mouth every 6 (six) hours as needed (SBP >/= 190).   Yes [provider]  insulin aspart (NOVOLOG FLEXPEN) 100 UNIT/ML FlexPen Inject 5 Units into the skin. After meals   Yes [provider]  insulin glargine (LANTUS) 100 UNIT/ML injection Inject 0.18 mLs (18 Units total) into the skin daily. Patient taking differently: Inject 18 Units into the skin at bedtime.   05/14/17  Yes [name], MD  labetalol (NORMODYNE) 200 MG tablet Place 1 tablet (200 mg total) into feeding tube every 8 (eight) hours. 05/13/17  Yes [name], MD  levETIRAcetam (KEPPRA) 100 MG/ML solution Place 5 mLs (500 mg total) into feeding tube 2 (two) times daily. 05/13/17  Yes [name], MD  Multiple Vitamin (MULTIVITAMIN WITH MINERALS) TABS tablet Take 1 tablet by mouth daily. 05/14/17  Yes [name], MD  Nutritional Supplements (PROMOD) LIQD Give 30 cc by mouth three times daily for low albumin   Yes [provider]  polyethylene glycol (MIRALAX / GLYCOLAX) packet Place 17 g into feeding tube daily as needed for severe constipation. 05/13/17  Yes [name], MD  thiamine 100 MG tablet Place 1 tablet (100 mg total) into feeding tube daily. 05/14/17  Yes [name], MD  Water For Irrigation, Sterile (FREE WATER) SOLN Place 400 mLs into feeding tube every 6 (six) hours. 05/13/17  Yes [name], MD    Physical Exam: Vitals:   05/17/17 1904 05/17/17 1930 05/17/17 2000 05/17/17 2030  BP: (!) 178/88 (!) 169/89 (!) 169/86 (!) 177/86  Pulse: 85 87 87 87  Resp: 18 14 10 18   Temp:      TempSrc:      SpO2: 100% 99% 98% 98%  Weight:      Height:        Constitutional: in mild to mod distress.  Vitals:   05/17/17 1904 05/17/17 1930 05/17/17 2000 05/17/17 2030  BP: (!) 178/88 (!) 169/89 (!) 169/86 (!) 177/86  Pulse: 85 87 87 87  Resp: 18 14 10 18   Temp:      TempSrc:      SpO2: 100% 99% 98% 98%  Weight:      Height:       Eyes: pupils sluggish, lids and conjunctivae normal ENMT: Mucous membranes are moist.  Neck: normal, supple, no masses, no thyromegaly Respiratory: bilateral rhonchi, more on the right. .  Cardiovascular: Regular rate and rhythm, no murmurs  Abdomen: soft , non tender, non distended, g tube in place.  Musculoskeletal: no clubbing / cyanosis.  Skin: no rashes, lesions, ulcers. No  induration Neurologic: encephalopathic, not responding to verbal cues, opening eyes to pain stimuli.  Psychiatric:not oriented.     Labs on Admission: I have personally reviewed following labs and imaging studies  CBC: Recent Labs  Lab 05/11/17 1054 05/12/17 0243 05/13/17 0227 05/17/17 1317  WBC 7.7 8.3 8.2 21.4*  NEUTROABS  --   --   --  19.2*  HGB 7.4* 7.9* 7.8* 8.9*  HCT 23.5* 25.1* 24.3* 28.1*  MCV 82.2 81.8 82.4 82.9  PLT 228 238 255 376   Basic Metabolic Panel: Recent Labs  Lab 05/11/17 1054 05/12/17 0243 05/13/17 0227 05/17/17 1317  NA 142 146* 144 148*  K 3.5 3.5 3.8 4.0  CL 113* 117* 118* 117*  CO2 19* 17* 15* 21*  GLUCOSE 434* 147* 172* 69  BUN 60* 59* 49* 27*  CREATININE 3.12* 3.31* 2.88* 2.09*  CALCIUM 7.8* 8.1* 7.8* 8.9  MG 2.3 2.3  --   --    GFR: Estimated Creatinine Clearance: 33.6 mL/min (A) (by C-G formula based on SCr of 2.09 mg/dL (H)). Liver Function Tests: Recent Labs  Lab 05/17/17 1317  AST 68*  ALT 38  ALKPHOS 327*  BILITOT 0.4  PROT 7.0  ALBUMIN 2.3*   No results for input(s): LIPASE, AMYLASE in the last 168 hours. No results for input(s): AMMONIA in the last 168 hours. Coagulation Profile: No results for input(s): INR, PROTIME in the last 168 hours. Cardiac Enzymes: No results for input(s): CKTOTAL, CKMB, CKMBINDEX, TROPONINI in the last 168 hours. BNP (last 3 results) No results for input(s): PROBNP in the last 8760 hours. HbA1C: No results for input(s): HGBA1C in the last 72 hours. CBG: Recent Labs  Lab 05/13/17 1223 05/17/17 1244 05/17/17 1340 05/17/17 1811 05/17/17 2022  GLUCAP 173* 53* 133* 168* 136*   Lipid Profile: No results for input(s): CHOL, HDL, LDLCALC, TRIG, CHOLHDL, LDLDIRECT in the last 72 hours. Thyroid Function Tests: No results for input(s): TSH, T4TOTAL, FREET4, T3FREE, THYROIDAB in the last 72 hours. Anemia Panel: No results for input(s): VITAMINB12, FOLATE, FERRITIN, TIBC, IRON, RETICCTPCT in  the last 72 hours. Urine analysis:    Component Value Date/Time   COLORURINE YELLOW 05/17/2017 1415   APPEARANCEUR HAZY (A) 05/17/2017 1415   LABSPEC >1.030 (H) 05/17/2017 1415   PHURINE 5.5 05/17/2017 1415   GLUCOSEU NEGATIVE 05/17/2017 1415   HGBUR MODERATE (A) 05/17/2017 1415   BILIRUBINUR NEGATIVE 05/17/2017 1415   KETONESUR NEGATIVE 05/17/2017 1415   PROTEINUR 300 (A) 05/17/2017 1415   NITRITE POSITIVE (A) 05/17/2017 1415   LEUKOCYTESUR NEGATIVE 05/17/2017 1415    Radiological Exams on Admission: Dg Chest Port 1 View  Result Date: 05/17/2017 CLINICAL DATA:  67 year old male with history of hypoglycemia. Unresponsive. Former smoker. EXAM: PORTABLE CHEST 1 VIEW COMPARISON:  Chest x-ray 05/06/2017. FINDINGS: Ill-defined opacity in the region of the right lower lobe, concerning for developing pneumonia. Left lung is clear. No pleural effusions. No evidence of pulmonary edema. Heart size is mildly enlarged. The patient is rotated to the right on today's exam, resulting in distortion of the mediastinal contours and reduced diagnostic sensitivity and specificity for mediastinal pathology. Aortic atherosclerosis. Metallic density projecting over the right mid hemithorax is unchanged compared to prior examinations. IMPRESSION: 1. Findings are concerning for probable right lower lobe pneumonia. Followup PA and lateral chest X-ray is recommended in 3-4 weeks following trial of antibiotic therapy to ensure resolution and exclude underlying malignancy. 2. Mild cardiomegaly. 3. Aortic atherosclerosis. Electronically Signed   By: Vinnie Langton M.D.   On: 05/17/2017 14:17      Assessment/Plan Active Problems:   Hypertension associated with diabetes ([place])   Seizure ([place])   DM (diabetes mellitus), type 2, uncontrolled, periph vascular complic (HCC)   Anemia, chronic disease   CKD (chronic kidney disease) stage 3, GFR 30-59 ml/min (HCC)   Status epilepticus (HCC)   Acute respiratory failure  with hypoxia (HCC)   Acute encephalopathy   Cerebral thrombosis with cerebral infarction   Dysphagia due to recent cerebrovascular accident (CVA)   S/P percutaneous endoscopic gastrostomy (PEG) tube placement ([place])   Dyslipidemia associated with type 2 diabetes mellitus ([place])   Unspecified severe protein-calorie malnutrition ([place])   HCAP (  healthcare-associated pneumonia)    Acute respiratory failure with hypoxia requiring NRB:  Admit to step down, start patient on IV vancomycin and cefepime for RLL PNEUMONIA and UTI.  Urine for strep pneumonia. Influenza pcr.  Will need SLP eval to rule out aspiration, once the patient is more alert and able to participate. As per the family ,he was able to take nectar thick liquids and was awake, alert and recognizing people.  Will get ABG and try to wean him down the NRB.     Recent multiple CVA'S WITH seizures:  Resume home meds.    Nutrition:  Oral nutrition held, and nutrition consulted for tube feeds.  Pt has a h/o dysphagia and has G tube.    Hypernatremia:  Due to poor free water intake.  Encourage free water via G tube  Severe protein energy malnutrition:  Dietary consulted.   Seizures:  Resume keppra via g tube.    Stage 3 CKD: Pt creatinine is better than baseline.   Acute encephalopathy;  Suspect secondary to health care associated pneumonia. Monitor for improvement.  Unclear if he had another stroke.  Hypertension:  Sub optimal. Resume labetalol, clonidine.    Diabetes Mellitus:  CBG (last 3)  Recent Labs    05/17/17 1340 05/17/17 1811 05/17/17 2022  GLUCAP 133* 168* 136*   Resume SSI. resume lantus when tube feeds are started.     Hyperlipidemia:  Resume lipitor.   In view of multiple medical problems , recent discharge from the hospital, recommend palliative care consult for goals of care.    Hypoglycemia: Probably diminished po intake, vs sepsis.  Holding lantus for now.   DVT prophylaxis:  lovenox.  Code Status: full code.  Family Communication: discussed with brother and sister in law over the phone.  Disposition Plan: pending clinical improvement.  Consults called: palliative care .  Admission status: inpatient. sdu   Hosie Poisson MD [facility] Pager [phone]  If 7PM-7AM, please contact night-coverage [email or web address] Password [facility]  05/17/2017, 9:14 PM

## 2017-05-17 NOTE — Progress Notes (Signed)
Pharmacy Antibiotic Note  Richard Bennett is a 67 y.o. male admitted on 05/17/2017 with pneumonia.  Pharmacy has been consulted for vanc/cefepime dosing.  Plan: 1) Vancomycin 1g x 1 already given at 1500. Give 500mg  x 1 extra dose to = 1500mg  total for first dose. Then start 1500mg  IV q48 - goal AUC 400-500 2) Cefepime 1g IV q12  Height: 5\' 8"  (172.7 cm) Weight: 172 lb (78 kg) IBW/kg (Calculated) : 68.4  Temp (24hrs), Avg:98 F (36.7 C), Min:97.8 F (36.6 C), Max:98.1 F (36.7 C)  Recent Labs  Lab 05/11/17 1054 05/12/17 0243 05/13/17 0227 05/17/17 1317 05/17/17 1421  WBC 7.7 8.3 8.2 21.4*  --   CREATININE 3.12* 3.31* 2.88* 2.09*  --   LATICACIDVEN  --   --   --   --  1.64    Estimated Creatinine Clearance: 33.6 mL/min (A) (by C-G formula based on SCr of 2.09 mg/dL (H)).    No Known Allergies   Thank you for allowing pharmacy to be a part of this patient's care.   Adrian Saran, PharmD, BCPS Pager [phone] 05/17/2017 4:03 PM

## 2017-05-17 NOTE — ED Triage Notes (Signed)
EMS reports from [place] at [facility] with Hypoglycemia, facilty nurse reports CBG at 30 in faciltiy given glucagon via PEG and EMS gave Glucagon 1mg  IM enroute  Initial CBG 36 After Glucagon IM CBG 53  BP 190/104 HR 88 Resp 30 Sp02 88 RA 100 10lts Non rebreather

## 2017-05-17 NOTE — Progress Notes (Signed)
A consult was received from an ED physician for Vancomycin & Zosyn per pharmacy dosing.  The patient's profile has been reviewed for ht/wt/allergies/indication/available labs.   A one time order has been placed for Vanc 1gm & Zosyn 3.375gm IV.  Further antibiotics/pharmacy consults should be ordered by admitting physician if indicated.                       Thank you, Biagio Borg 05/17/2017  2:04 PM

## 2017-05-17 NOTE — ED Notes (Signed)
ED TO INPATIENT HANDOFF REPORT  Name/Age/Gender Richard Bennett 67 y.o. male  Code Status Code Status History    Date Active Date Inactive Code Status Order ID Comments User Context   05/04/2017 11:29 05/13/2017 19:33 Full Code [id number]  [name], MD Inpatient   04/29/2017 16:44 05/04/2017 11:29 Full Code [id number]  Corrie Mckusick, DO Inpatient   04/16/2017 04:22 04/29/2017 16:44 Full Code [id number]  Arnell Asal, NP Inpatient   04/11/2017 05:14 04/11/2017 15:36 Full Code [id number]  Rise Patience, MD ED   07/22/2016 03:31 07/27/2016 20:07 Full Code [id number]  Roswell Nickel, MD ED   10/30/2015 14:44 11/02/2015 16:25 Full Code [id number]  Radene Gunning, NP ED      Home/SNF/Other Skilled nursing facility  Chief Complaint hyperglycemia  Level of Care/Admitting Diagnosis ED Disposition    ED Disposition Condition [place]: [facility] [100102]  Level of Care: Stepdown [14]  Admit to SDU based on following criteria: Respiratory Distress:  Frequent assessment and/or intervention to maintain adequate ventilation/respiration, pulmonary toilet, and respiratory treatment.  Diagnosis: HCAP (healthcare-associated pneumonia) [202542]  Admitting Physician: Hosie Poisson [[id number]]  Attending Physician: Hosie Poisson [[id number]]  Estimated length of stay: past midnight tomorrow  Certification:: I certify this patient will need inpatient services for at least 2 midnights  PT Class (Do Not Modify): Inpatient [101]  PT Acc Code (Do Not Modify): Private [1]       Medical History Past Medical History:  Diagnosis Date  . Chest pain   . Colon polyps    adenomatous  . Hyperlipidemia   . Hypertension   . Refusal of blood transfusions as patient is Jehovah's Witness   . Stroke ([place])   . Type II diabetes mellitus ([place])     Allergies No Known Allergies  IV Location/Drains/Wounds Patient Lines/Drains/Airways Status   Active Line/Drains/Airways    Name:   Placement date:   Placement time:   Site:   Days:   Peripheral IV 05/17/17 Left;Upper Arm   05/17/17    1314    Arm   less than 1   Gastrostomy/Enterostomy Gastrostomy 20 Fr. LUQ   04/29/17    1550    LUQ   18   Rectal Tube/Pouch   05/09/17    2300    -   8   Urethral Catheter Mitzi Davenport, RN Straight-tip 16 Fr.   05/08/17    0020    Straight-tip   9   Pressure Injury 05/02/17 Stage II -  Partial thickness loss of dermis presenting as a shallow open ulcer with a red, pink wound bed without slough.   05/02/17    0839     15   Wound / Incision (Open or Dehisced) 04/11/17 Laceration Head Posterior;Right;Upper closed, sutured   04/11/17    1200    Head   36          Labs/Imaging Results for orders placed or performed during the hospital encounter of 05/17/17 (from the past 48 hour(s))  CBG monitoring, ED     Status: Abnormal   Collection Time: 05/17/17 12:44 PM  Result Value Ref Range   Glucose-Capillary 53 (L) 65 - 99 mg/dL  CBC with Differential     Status: Abnormal   Collection Time: 05/17/17  1:17 PM  Result Value Ref Range   WBC 21.4 (H) 4.0 - 10.5 K/uL   RBC 3.39 (L) 4.22 - 5.81 MIL/uL   Hemoglobin 8.9 (L)  13.0 - 17.0 g/dL   HCT 28.1 (L) 39.0 - 52.0 %   MCV 82.9 78.0 - 100.0 fL   MCH 26.3 26.0 - 34.0 pg   MCHC 31.7 30.0 - 36.0 g/dL   RDW 15.0 11.5 - 15.5 %   Platelets 379 150 - 400 K/uL   Neutrophils Relative % 90 %   Lymphocytes Relative 4 %   Monocytes Relative 6 %   Eosinophils Relative 0 %   Basophils Relative 0 %   Neutro Abs 19.2 (H) 1.7 - 7.7 K/uL   Lymphs Abs 0.9 0.7 - 4.0 K/uL   Monocytes Absolute 1.3 (H) 0.1 - 1.0 K/uL   Eosinophils Absolute 0.0 0.0 - 0.7 K/uL   Basophils Absolute 0.0 0.0 - 0.1 K/uL   RBC Morphology POLYCHROMASIA PRESENT    WBC Morphology INCREASED BANDS (>20% BANDS)     Comment: Performed at [facility], [place] [address], [place], [place] [address]  Comprehensive metabolic panel     Status: Abnormal   Collection  Time: 05/17/17  1:17 PM  Result Value Ref Range   Sodium 148 (H) 135 - 145 mmol/L   Potassium 4.0 3.5 - 5.1 mmol/L   Chloride 117 (H) 101 - 111 mmol/L   CO2 21 (L) 22 - 32 mmol/L   Glucose, Bld 69 65 - 99 mg/dL   BUN 27 (H) 6 - 20 mg/dL   Creatinine, Ser 2.09 (H) 0.61 - 1.24 mg/dL   Calcium 8.9 8.9 - 10.3 mg/dL   Total Protein 7.0 6.5 - 8.1 g/dL   Albumin 2.3 (L) 3.5 - 5.0 g/dL   AST 68 (H) 15 - 41 U/L   ALT 38 17 - 63 U/L   Alkaline Phosphatase 327 (H) 38 - 126 U/L   Total Bilirubin 0.4 0.3 - 1.2 mg/dL   GFR calc non Af Amer 31 (L) >60 mL/min   GFR calc Af Amer 36 (L) >60 mL/min    Comment: (NOTE) The eGFR has been calculated using the CKD EPI equation. This calculation has not been validated in all clinical situations. eGFR's persistently <60 mL/min signify possible Chronic Kidney Disease.    Anion gap 10 5 - 15    Comment: Performed at [facility], [place] [address], [place], [place] [address]  CBG monitoring, ED     Status: Abnormal   Collection Time: 05/17/17  1:40 PM  Result Value Ref Range   Glucose-Capillary 133 (H) 65 - 99 mg/dL  Urinalysis, Routine w reflex microscopic     Status: Abnormal   Collection Time: 05/17/17  2:15 PM  Result Value Ref Range   Color, Urine YELLOW YELLOW   APPearance HAZY (A) CLEAR   Specific Gravity, Urine >1.030 (H) 1.005 - 1.030   pH 5.5 5.0 - 8.0   Glucose, UA NEGATIVE NEGATIVE mg/dL   Hgb urine dipstick MODERATE (A) NEGATIVE   Bilirubin Urine NEGATIVE NEGATIVE   Ketones, ur NEGATIVE NEGATIVE mg/dL   Protein, ur 300 (A) NEGATIVE mg/dL   Nitrite POSITIVE (A) NEGATIVE   Leukocytes, UA NEGATIVE NEGATIVE   RBC / HPF 6-30 0 - 5 RBC/hpf   WBC, UA 6-30 0 - 5 WBC/hpf   Bacteria, UA MANY (A) NONE SEEN   Squamous Epithelial / LPF NONE SEEN NONE SEEN   Mucus PRESENT    Granular Casts, UA PRESENT     Comment: Performed at [facility], [place] [address], [place], [place] [address]  I-Stat CG4 Lactic Acid, ED   (  not at  [facility])     Status: None   Collection Time: 05/17/17  2:21 PM  Result Value Ref Range   Lactic Acid, Venous 1.64 0.5 - 1.9 mmol/L  CBG monitoring, ED     Status: Abnormal   Collection Time: 05/17/17  6:11 PM  Result Value Ref Range   Glucose-Capillary 168 (H) 65 - 99 mg/dL  Blood gas, arterial     Status: Abnormal   Collection Time: 05/17/17  8:20 PM  Result Value Ref Range   FIO2 21.00    Delivery systems ROOM AIR    pH, Arterial 7.443 7.350 - 7.450   pCO2 arterial 25.2 (L) 32.0 - 48.0 mmHg   pO2, Arterial 54.1 (L) 83.0 - 108.0 mmHg   Bicarbonate 17.0 (L) 20.0 - 28.0 mmol/L   Acid-base deficit 5.9 (H) 0.0 - 2.0 mmol/L   O2 Saturation 88.4 %   Patient temperature 98.6    Collection site RIGHT RADIAL    Drawn by [id number]    Sample type ARTERIAL DRAW    Allens test (pass/fail) PASS PASS    Comment: Performed at [facility], [place] [address] [place],  [address]  CBG monitoring, ED     Status: Abnormal   Collection Time: 05/17/17  8:22 PM  Result Value Ref Range   Glucose-Capillary 136 (H) 65 - 99 mg/dL   Dg Chest Port 1 View  Result Date: 05/17/2017 CLINICAL DATA:  67 year old male with history of hypoglycemia. Unresponsive. Former smoker. EXAM: PORTABLE CHEST 1 VIEW COMPARISON:  Chest x-ray 05/06/2017. FINDINGS: Ill-defined opacity in the region of the right lower lobe, concerning for developing pneumonia. Left lung is clear. No pleural effusions. No evidence of pulmonary edema. Heart size is mildly enlarged. The patient is rotated to the right on today's exam, resulting in distortion of the mediastinal contours and reduced diagnostic sensitivity and specificity for mediastinal pathology. Aortic atherosclerosis. Metallic density projecting over the right mid hemithorax is unchanged compared to prior examinations. IMPRESSION: 1. Findings are concerning for probable right lower lobe pneumonia. Followup PA and lateral chest X-ray is recommended in 3-4 weeks  following trial of antibiotic therapy to ensure resolution and exclude underlying malignancy. 2. Mild cardiomegaly. 3. Aortic atherosclerosis. Electronically Signed   By: Vinnie Langton M.D.   On: 05/17/2017 14:17    Pending Labs Unresulted Labs (From admission, onward)   Start     Ordered   05/17/17 1945  Influenza panel by PCR (type A & B)  (Influenza PCR Panel)  Once,   R     05/17/17 1944   05/17/17 1547  Strep pneumoniae urinary antigen  Once,   R     05/17/17 1546   05/17/17 1547  Legionella Pneumophila Serogp 1 Ur Ag  Once,   R     05/17/17 1546   05/17/17 1547  Culture, expectorated sputum-assessment  Once,   R     05/17/17 1546   05/17/17 1349  Blood Culture (routine x 2)  BLOOD CULTURE X 2,   STAT     05/17/17 1349   Signed and Held  Gram stain  Once,   R     Signed and Held   Signed and Held  HIV antibody (Routine Screening)  Once,   R     Signed and Held   Signed and Held  Creatinine, serum  (enoxaparin (LOVENOX)    CrCl >/= 30 ml/min)  Weekly,   R    Comments:  while on  enoxaparin therapy    Signed and Held      Vitals/Pain Today's Vitals   05/17/17 1904 05/17/17 1930 05/17/17 2000 05/17/17 2030  BP: (!) 178/88 (!) 169/89 (!) 169/86 (!) 177/86  Pulse: 85 87 87 87  Resp: 18 14 10 18   Temp:      TempSrc:      SpO2: 100% 99% 98% 98%  Weight:      Height:      PainSc:        Isolation Precautions Droplet precaution  Medications Medications  vancomycin (VANCOCIN) 1,500 mg in sodium chloride 0.9 % 500 mL IVPB (not administered)  ceFEPIme (MAXIPIME) 1 g in sodium chloride 0.9 % 100 mL IVPB (not administered)  insulin aspart (novoLOG) injection 0-9 Units (not administered)  dextrose 50 % solution (50 mLs  Given 05/17/17 1314)  piperacillin-tazobactam (ZOSYN) IVPB 3.375 g (0 g Intravenous Stopped 05/17/17 1555)  vancomycin (VANCOCIN) IVPB 1000 mg/200 mL premix (0 mg Intravenous Stopped 05/17/17 1613)  sodium chloride 0.9 % bolus 500 mL (0 mLs Intravenous Stopped  05/17/17 1613)  vancomycin (VANCOCIN) 500 mg in sodium chloride 0.9 % 100 mL IVPB (0 mg Intravenous Stopped 05/17/17 1720)    Mobility non-ambulatory

## 2017-05-17 NOTE — ED Provider Notes (Signed)
[facility] DEPT Provider Note   CSN: [id number] Arrival date & time: 05/17/17  1228     History   Chief Complaint Chief Complaint  Patient presents with  . Hypoglycemia    HPI Richard Bennett is a 67 y.o. male with complex medical history who presents with hypoglycemia. PMH significant for recent CVA with right sided hemiplegia, IDDM, HTN, HLD, hx of seizures, polysubstance abuse, nonverbal, Jehovah's witness. He was recently discharged from the hospital to SNF due to a stroke and status epilepticus. He was admitted from 2/8-3/13. He was seen by his PCP yesterday who increased his sliding scale insulin since his A1C was 10. He takes 18 units of Lantus and his Novolog was increased to 6 units after meals. CXR on 3/5 showed mild hypoinflation with patchy bibasilar opacities. He also has a PEG and indwelling foley. Patient is full code.  LEVEL 5 CAVEAT due to patient being non-verbal    HPI  Past Medical History:  Diagnosis Date  . Chest pain   . Colon polyps    adenomatous  . Hyperlipidemia   . Hypertension   . Refusal of blood transfusions as patient is Jehovah's Witness   . Stroke ([place])   . Type II diabetes mellitus [facility])     Patient Active Problem List   Diagnosis Date Noted  . S/P percutaneous endoscopic gastrostomy (PEG) tube placement ([place]) 05/14/2017  . 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 Acute basilic vein embolism, right 05/14/2017  . Dyslipidemia associated with type 2 diabetes mellitus ([place]) 05/14/2017  . Unspecified severe protein-calorie malnutrition ([place]) 05/14/2017  . Pressure injury of skin 05/03/2017  . Dysphagia due to recent cerebrovascular accident (CVA)   . Goals of care, counseling/discussion   . Palliative care encounter   . Cerebral thrombosis with cerebral infarction 04/17/2017  . Acute respiratory failure with hypoxia ([place])   . Acute encephalopathy   .  Hyperosmolar non-ketotic state in patient with type 2 diabetes mellitus ([place]) 04/11/2017  . Status epilepticus ([place]) 04/11/2017  . Irritable bowel syndrome with diarrhea 08/13/2016  . Anemia, chronic disease 08/13/2016  . CKD (chronic kidney disease) stage 3, GFR 30-59 ml/min (HCC) 08/13/2016  . Left carotid stenosis 07/27/2016  . DM (diabetes mellitus), type 2, uncontrolled, periph vascular complic ([place]) 40/98/1191  . Seizure ([place])   . Hypertensive emergency 10/30/2015  . Hypertension associated with diabetes [facility])     Past Surgical History:  Procedure Laterality Date  . APPENDECTOMY    . BACK SURGERY    . IR GASTROSTOMY TUBE MOD SED  04/29/2017  . LUMBAR DISC SURGERY     "herniated"       Home Medications    Prior to Admission medications   Medication Sig Start Date End Date Taking? Authorizing Provider  acetaminophen (TYLENOL) 160 MG/5ML solution Place 20.3 mLs (650 mg total) into feeding tube every 6 (six) hours as needed for mild pain or fever. 05/13/17   [name], MD  alum & mag hydroxide-simeth (MAALOX/MYLANTA) 200-200-20 MG/5ML suspension Take 15 mLs by mouth every 4 (four) hours as needed for indigestion or heartburn. 05/13/17   [name], MD  amLODipine (NORVASC) 10 MG tablet Take 10 mg by mouth daily.    [provider]  aspirin 325 MG tablet Place 1 tablet (325 mg total) into feeding tube daily. 05/14/17   [name], MD  atorvastatin (LIPITOR) 40 MG tablet Place 1 tablet (40  mg total) into feeding tube daily at 6 PM. 05/13/17   [name], MD  bisacodyl (DULCOLAX) 10 MG suppository Place 1 suppository (10 mg total) rectally daily as needed for moderate constipation. 05/13/17   [name], MD  cloNIDine (CATAPRES) 0.3 MG tablet Take 0.3 mg by mouth 3 (three) times daily. 05/15/17   [provider]  docusate (COLACE) 50 MG/5ML liquid Place 10 mLs (100 mg total) into feeding tube 2 (two) times daily as needed for mild  constipation. 05/13/17   [name], MD  folic acid (FOLVITE) 1 MG tablet Place 1 tablet (1 mg total) into feeding tube daily. 05/14/17   [name], MD  insulin aspart (NOVOLOG FLEXPEN) 100 UNIT/ML FlexPen Inject 5 Units into the skin. After meals    [provider]  insulin glargine (LANTUS) 100 UNIT/ML injection Inject 0.18 mLs (18 Units total) into the skin daily. 05/14/17   [name], MD  labetalol (NORMODYNE) 200 MG tablet Place 1 tablet (200 mg total) into feeding tube every 8 (eight) hours. 05/13/17   [name], MD  levETIRAcetam (KEPPRA) 100 MG/ML solution Place 5 mLs (500 mg total) into feeding tube 2 (two) times daily. 05/13/17   [name], MD  Multiple Vitamin (MULTIVITAMIN WITH MINERALS) TABS tablet Take 1 tablet by mouth daily. 05/14/17   [name], MD  Nutritional Supplements (NUTRITIONAL SUPPLEMENT PO) Low Concentrated Sweets Diet -  Pureed texture, honey thickened Fluids consistency    [provider]  Nutritional Supplements (PROMOD) LIQD Give 30 cc by mouth three times daily for low albumin    [provider]  polyethylene glycol (MIRALAX / GLYCOLAX) packet Place 17 g into feeding tube daily as needed for severe constipation. 05/13/17   [name], MD  thiamine 100 MG tablet Place 1 tablet (100 mg total) into feeding tube daily. 05/14/17   [name], MD  Water For Irrigation, Sterile (FREE WATER) SOLN Place 400 mLs into feeding tube every 6 (six) hours. 05/13/17   [name], MD    Family History Family History  Problem Relation Age of Onset  . Diabetes Mother   . Stroke Mother   . Cancer Father   . Diabetes Brother   . Seizures Brother   . Colon cancer Neg Hx   . Esophageal cancer Neg Hx   . Stomach cancer Neg Hx   . Rectal cancer Neg Hx     Social History Social History   Tobacco Use  . Smoking status: Former Smoker    Packs/day: 0.50    Years: 20.00    Pack years:  10.00    Types: Cigarettes    Last attempt to quit: 03/04/1978    Years since quitting: 39.2  . Smokeless tobacco: Never Used  Substance Use Topics  . Alcohol use: Yes    Comment: 10/30/2015 "might have 2-3 drinks/year"  . Drug use: Yes    Types: Heroin    Comment: 10/30/2015 "nothing in the 2000s"     Allergies   Patient has no known allergies.   Review of Systems Review of Systems  Unable to perform ROS: Patient nonverbal     Physical Exam Updated Vital Signs BP (!) 159/112   Pulse 87   Temp 98.1 F (36.7 C)   Resp (!) 24   Ht 5\' 8"  (1.727 m)   Wt 78 kg (172 lb)   SpO2 100%   BMI 26.15 kg/m  Physical Exam  Constitutional: He appears well-developed and well-nourished. He is sleeping. He appears ill. No distress. Nasal cannula in place.  Sleeping  HENT:  Head: Normocephalic and atraumatic.  Eyes: Conjunctivae are normal. Pupils are equal, round, and reactive to light. Right eye exhibits no discharge. Left eye exhibits no discharge. No scleral icterus.  Neck: Normal range of motion.  Cardiovascular: Normal rate and regular rhythm.  Pulmonary/Chest: Effort normal. No accessory muscle usage. No respiratory distress. He has no wheezes. He has rhonchi in the right upper field, the right middle field, the right lower field, the left upper field, the left middle field and the left lower field.  Abdominal: He exhibits no distension. There is no tenderness.  PEG in place without surrounding erythema  Skin: Skin is warm and dry.  Psychiatric: He has a normal mood and affect. His behavior is normal.  Nursing note and vitals reviewed.    ED Treatments / Results  Labs (all labs ordered are listed, but only abnormal results are displayed) Labs Reviewed  CBC WITH DIFFERENTIAL/PLATELET - Abnormal; Notable for the following components:      Result Value   WBC 21.4 (*)    RBC 3.39 (*)    Hemoglobin 8.9 (*)    HCT 28.1 (*)    Neutro Abs 19.2 (*)    Monocytes Absolute 1.3  (*)    All other components within normal limits  COMPREHENSIVE METABOLIC PANEL - Abnormal; Notable for the following components:   Sodium 148 (*)    Chloride 117 (*)    CO2 21 (*)    BUN 27 (*)    Creatinine, Ser 2.09 (*)    Albumin 2.3 (*)    AST 68 (*)    Alkaline Phosphatase 327 (*)    GFR calc non Af Amer 31 (*)    GFR calc Af Amer 36 (*)    All other components within normal limits  CBG MONITORING, ED - Abnormal; Notable for the following components:   Glucose-Capillary 53 (*)    All other components within normal limits  CBG MONITORING, ED - Abnormal; Notable for the following components:   Glucose-Capillary 133 (*)    All other components within normal limits  CULTURE, BLOOD (ROUTINE X 2)  CULTURE, BLOOD (ROUTINE X 2)  URINALYSIS, ROUTINE W REFLEX MICROSCOPIC  I-STAT CG4 LACTIC ACID, ED    EKG  EKG Interpretation None       Radiology Dg Chest Port 1 View  Result Date: 05/17/2017 CLINICAL DATA:  67 year old male with history of hypoglycemia. Unresponsive. Former smoker. EXAM: PORTABLE CHEST 1 VIEW COMPARISON:  Chest x-ray 05/06/2017. FINDINGS: Ill-defined opacity in the region of the right lower lobe, concerning for developing pneumonia. Left lung is clear. No pleural effusions. No evidence of pulmonary edema. Heart size is mildly enlarged. The patient is rotated to the right on today's exam, resulting in distortion of the mediastinal contours and reduced diagnostic sensitivity and specificity for mediastinal pathology. Aortic atherosclerosis. Metallic density projecting over the right mid hemithorax is unchanged compared to prior examinations. IMPRESSION: 1. Findings are concerning for probable right lower lobe pneumonia. Followup PA and lateral chest X-ray is recommended in 3-4 weeks following trial of antibiotic therapy to ensure resolution and exclude underlying malignancy. 2. Mild cardiomegaly. 3. Aortic atherosclerosis. Electronically Signed   By: Vinnie Langton  M.D.   On: 05/17/2017 14:17    Procedures Procedures (including critical care time)  CRITICAL CARE Performed by: Recardo Evangelist   Total  critical care time: 30 minutes  Critical care time was exclusive of separately billable procedures and treating other patients.  Critical care was necessary to treat or prevent imminent or life-threatening deterioration.  Critical care was time spent personally by me on the following activities: development of treatment plan with patient and/or surrogate as well as nursing, discussions with consultants, evaluation of patient's response to treatment, examination of patient, obtaining history from patient or surrogate, ordering and performing treatments and interventions, ordering and review of laboratory studies, ordering and review of radiographic studies, pulse oximetry and re-evaluation of patient's condition.   Medications Ordered in ED Medications  piperacillin-tazobactam (ZOSYN) IVPB 3.375 g (not administered)  vancomycin (VANCOCIN) IVPB 1000 mg/200 mL premix (not administered)  sodium chloride 0.9 % bolus 500 mL (not administered)  dextrose 50 % solution (50 mLs  Given 05/17/17 1314)     Initial Impression / Assessment and Plan / ED Course  I have reviewed the triage vital signs and the nursing notes.  Pertinent labs & imaging results that were available during my care of the patient were reviewed by me and considered in my medical decision making (see chart for details).  Clinical Course as of May 18 1446  Sat May 17, 2017  1348 Glucose-Capillary: Marland Kitchen 133 [KG]    Clinical Course User Index [KG] Recardo Evangelist, PA-C   67 year old male presents with hypoglcyemia (was 30 at SNF and 50 in the ED). He was given 1amp of D50 which improved his glucose to 133. Additionally he is hypertensive and hypoxic to 86% on 2L 02 via [place]. He was switched to a NRB and titrated up to 10L. He is non-verbal on exam and is unable to contribute to history.  He has rhonchorous breath sounds. Urine in his foley also has sediment in it. Will initiate labs for sepsis, obtain CXR, UA.   CBC is remarkable for marked leukocytosis of 21.4 which is elevated compared to 4 days ago. He has >20% increase in bands. Anemia has improved. Lactic acid is normal. Blood cultures were drawn. CMP is remarkable for hypernatremia (148), hyperchloremia (117), improved SCr (2.09), elevated AP (327). CXR is remarkable for RLL pneumonia. He was covered with broad spectrum antibiotics. Shared visit with Dr. Marcha Dutton. Discussed with Dr. Karleen Hampshire who will admit.   Final Clinical Impressions(s) / ED Diagnoses   Final diagnoses:  Hyponatremia  Acute respiratory failure with hypoxia ([place])  HCAP (healthcare-associated pneumonia)  Hypoglycemia associated with diabetes ([place])  Anemia, unspecified type    ED Discharge Orders    None       Recardo Evangelist, PA-C 05/17/17 1458    Marcha Dutton Forbes Cellar, MD 05/17/17 1505

## 2017-05-17 NOTE — ED Notes (Addendum)
[facility] [place]- would like update.

## 2017-05-17 NOTE — ED Notes (Signed)
Khayri Kargbo [phone], youngest brother called to check on patient

## 2017-05-17 NOTE — ED Notes (Signed)
Bed: WA09 Expected date: 05/17/17 Expected time: 12:20 PM Means of arrival: Ambulance Comments: Hypoglycemia, unresponsive except pain

## 2017-05-18 LAB — CBC WITH DIFFERENTIAL/PLATELET
BASOS ABS: 0 10*3/uL (ref 0.0–0.1)
BASOS PCT: 0 %
EOS ABS: 0 10*3/uL (ref 0.0–0.7)
EOS PCT: 0 %
HCT: 22.3 % — ABNORMAL LOW (ref 39.0–52.0)
Hemoglobin: 7.1 g/dL — ABNORMAL LOW (ref 13.0–17.0)
Lymphocytes Relative: 6 %
Lymphs Abs: 1.8 10*3/uL (ref 0.7–4.0)
MCH: 25.9 pg — ABNORMAL LOW (ref 26.0–34.0)
MCHC: 31.8 g/dL (ref 30.0–36.0)
MCV: 81.4 fL (ref 78.0–100.0)
Monocytes Absolute: 1.2 10*3/uL — ABNORMAL HIGH (ref 0.1–1.0)
Monocytes Relative: 4 %
Neutro Abs: 26.9 10*3/uL — ABNORMAL HIGH (ref 1.7–7.7)
Neutrophils Relative %: 90 %
Platelets: 295 10*3/uL (ref 150–400)
RBC: 2.74 MIL/uL — AB (ref 4.22–5.81)
RDW: 15 % (ref 11.5–15.5)
WBC Morphology: INCREASED
WBC: 29.9 10*3/uL — ABNORMAL HIGH (ref 4.0–10.5)

## 2017-05-18 LAB — HIV ANTIBODY (ROUTINE TESTING W REFLEX): HIV Screen 4th Generation wRfx: NONREACTIVE

## 2017-05-18 LAB — GLUCOSE, CAPILLARY
GLUCOSE-CAPILLARY: 161 mg/dL — AB (ref 65–99)
GLUCOSE-CAPILLARY: 163 mg/dL — AB (ref 65–99)
GLUCOSE-CAPILLARY: 159 mg/dL — AB (ref 65–99)
Glucose-Capillary: 156 mg/dL — ABNORMAL HIGH (ref 65–99)
Glucose-Capillary: 191 mg/dL — ABNORMAL HIGH (ref 65–99)
Glucose-Capillary: 224 mg/dL — ABNORMAL HIGH (ref 65–99)

## 2017-05-18 LAB — BASIC METABOLIC PANEL
Anion gap: 10 (ref 5–15)
BUN: 29 mg/dL — AB (ref 6–20)
CALCIUM: 7.9 mg/dL — AB (ref 8.9–10.3)
CO2: 17 mmol/L — ABNORMAL LOW (ref 22–32)
CREATININE: 2.3 mg/dL — AB (ref 0.61–1.24)
Chloride: 118 mmol/L — ABNORMAL HIGH (ref 101–111)
GFR calc Af Amer: 32 mL/min — ABNORMAL LOW (ref >60)
GFR, EST NON AFRICAN AMERICAN: 28 mL/min — AB (ref >60)
Glucose, Bld: 172 mg/dL — ABNORMAL HIGH (ref 65–99)
POTASSIUM: 4.4 mmol/L (ref 3.5–5.1)
SODIUM: 145 mmol/L (ref 135–145)

## 2017-05-18 LAB — MAGNESIUM
MAGNESIUM: 1.8 mg/dL (ref 1.7–2.4)
Magnesium: 1.7 mg/dL (ref 1.7–2.4)

## 2017-05-18 LAB — PHOSPHORUS
PHOSPHORUS: 4.1 mg/dL (ref 2.5–4.6)
PHOSPHORUS: 3.6 mg/dL (ref 2.5–4.6)

## 2017-05-18 LAB — MRSA PCR SCREENING: MRSA by PCR: NEGATIVE

## 2017-05-18 LAB — STREP PNEUMONIAE URINARY ANTIGEN: Strep Pneumo Urinary Antigen: NEGATIVE

## 2017-05-18 MED ORDER — OSMOLITE 1.5 CAL PO LIQD
1000.0000 mL | ORAL | Status: DC
  Administered 2017-05-18 – 2017-05-20 (×2): 1000 mL
  Filled 2017-05-18 (×3): qty 1000

## 2017-05-18 MED ORDER — JEVITY 1.2 CAL PO LIQD: 1000.0000 mL | ORAL | Status: DC

## 2017-05-18 MED ORDER — ORAL CARE MOUTH RINSE
15.0000 mL | Freq: Two times a day (BID) | OROMUCOSAL | Status: DC
  Administered 2017-05-18 – 2017-05-20 (×4): 15 mL via OROMUCOSAL

## 2017-05-18 MED ORDER — SODIUM CHLORIDE 0.45 % IV SOLN
INTRAVENOUS | Status: DC
  Administered 2017-05-18 – 2017-05-19 (×3): via INTRAVENOUS

## 2017-05-18 MED ORDER — PIPERACILLIN-TAZOBACTAM 3.375 G IVPB
3.3750 g | Freq: Three times a day (TID) | INTRAVENOUS | Status: DC
  Administered 2017-05-18 – 2017-05-19 (×4): 3.375 g via INTRAVENOUS
  Filled 2017-05-18 (×4): qty 50

## 2017-05-18 MED ORDER — PIPERACILLIN-TAZOBACTAM 3.375 G IVPB 30 MIN: 3.3750 g | Freq: Four times a day (QID) | INTRAVENOUS | Status: DC

## 2017-05-18 MED ORDER — INSULIN ASPART 100 UNIT/ML ~~LOC~~ SOLN
0.0000 [IU] | SUBCUTANEOUS | Status: DC
  Administered 2017-05-18: 3 [IU] via SUBCUTANEOUS
  Administered 2017-05-18: 2 [IU] via SUBCUTANEOUS
  Administered 2017-05-19 (×4): 3 [IU] via SUBCUTANEOUS
  Administered 2017-05-19: 5 [IU] via SUBCUTANEOUS
  Administered 2017-05-20: 3 [IU] via SUBCUTANEOUS
  Administered 2017-05-20: 2 [IU] via SUBCUTANEOUS
  Administered 2017-05-20 (×2): 3 [IU] via SUBCUTANEOUS

## 2017-05-18 NOTE — Progress Notes (Signed)
PROGRESS NOTE    Richard Bennett  KCL:[id number] DOB: [birth year] DOA: 05/17/2017 PCP: [name], MD    Brief Narrative:  67 year old male who presented with hypoxemia and hypoglycemia.  He does have a significant past medical history for history of CVA with right hemiparesis, anemia of chronic disease, hypertension, type 2 diabetes mellitus, seizures, and the right sided basilic vein nonocclusive DVT.  Patient was brought from the skilled nursing facility with significant confusion.  On the initial physical examination pressure 97.8, blood pressure 156/121, heart rate 91, respiratory rate 20, oxygen saturation 87% on nonrebreather mask.  Dry mucous membranes, lungs with rhonchi bilaterally, predominantly on the right, heart S1-S2 present, regular, abdomen was soft and nontender, G-tube in place, no lower extremity edema.  Sodium 148, potassium 4.0, chloride 117, bicarb 21, glucose 69, BUN 27, creatinine 2.09, ABG, 7.44/ 25.2/ 54.1/ 17/ 88%.  White count 21.4, hemoglobin 8.9, hematocrit 28.1, platelets 379, influenza A and B negative, urinalysis pH 5.5, protein 300, specific gravity greater than 1.030, positive granular casts, RBCs 6-30, white cells 6-30.  Chest film with large right basilar infiltrate CT with slightly increased size of left convexity extra axial collection, suggesting subdural hygroma or chronic subdural hematoma, 5 mm rightward midline shift.   Patient was admitted to the hospital with the working diagnosis of acute hypoxic respiratory failure due to right lower lobe pneumonia, complicated by acute metabolic encephalopathy and hypoglycemia.   Assessment & Plan:   Active Problems:   Hypertension associated with diabetes ([place])   Seizure ([place])   DM (diabetes mellitus), type 2, uncontrolled, periph vascular complic (HCC)   Anemia, chronic disease   CKD (chronic kidney disease) stage 3, GFR 30-59 ml/min (HCC)   Status epilepticus (HCC)   Acute respiratory failure with hypoxia  (HCC)   Acute encephalopathy   Cerebral thrombosis with cerebral infarction   Dysphagia due to recent cerebrovascular accident (CVA)   S/P percutaneous endoscopic gastrostomy (PEG) tube placement ([place])   Dyslipidemia associated with type 2 diabetes mellitus ([place])   Unspecified severe protein-calorie malnutrition ([place])   HCAP (healthcare-associated pneumonia)   1.  Acute hypoxic respiratory failure. Oxygenating at 99% on 4 LPM per nasal cannula, will continue oxymetry monitoring and aspiration precautions. Antibiotic therapy with cefepime and vancomycin, will change to IV Zosyn, follow cell count and temperature curve. As needed bronchodilator therapy. Hold on systemic steroids for now. Positive fever spike today.   2.  Metabolic encephalopathy. Patient with chronic CVA, has a gastric tube in place, unclear baseline, this am is not responsive, but protecting airway. Continue neuro checks per unit protocol, aspiration precautions.   3.  Hypoglycemia. Capillary glucose 133, 141, 156, 163, 161. Will resume feedings per G tube. Continue glucose monitoring. Hold on long acting insulin for now, as outpatient on 18 units of insulin glargine at night. Continue insulin sliding scale for glucose cover and montoring. Will resume tube feedings, follow dietary for further recommendations.   4.  Chronic kidney disease stage III with hypernatremia and hyperchloremia. Renal function with serum cr at 2.09, K at 4.0 and serum bicarbonate, will continue gentle hydration with half normal saline to prevent worsening hypernatremia and hyperchloremia, follow on renal panel in am.   5.  History of CVA. Will continue neuro checks per unit protocol, aspiration precautions.   6.  Seizures. Continue anti-seizure agents, no clinical signs of active seizures.  Continue keppra.   7. Hypertension. Will continue to hold on antihypertensive agents, amlodipine, hydralazine. Continue clonidine and labetalol.  DVT  prophylaxis: enoxaparin   Code Status:  full Family Communication: No family at the bedside Disposition Plan: snf when stable   Consultants:     Procedures:     Antimicrobials:   Zosyn    Subjective: Patient is not responsive, and not able to communicate, all information from the medical records and nursing at bedside.   Objective: Vitals:   05/18/17 0400 05/18/17 0500 05/18/17 0728 05/18/17 0800  BP: (!) 135/54 (!) 156/93  (!) 161/59  Pulse: 78     Resp: 19 (!) 24  (!) 21  Temp:   (!) 100.4 F (38 C)   TempSrc:   Oral   SpO2: 99% 100%  98%  Weight:      Height:        Intake/Output Summary (Last 24 hours) at 05/18/2017 0824 Last data filed at 05/18/2017 0600 Gross per 24 hour  Intake 1350 ml  Output 400 ml  Net 950 ml   Filed Weights   05/17/17 1246  Weight: 78 kg (172 lb)    Examination:   General: deconditioned and ill looking appearing Neurology: Not responsive, not verbal E ENT: no pallor, no icterus, oral mucosa moist Cardiovascular: No JVD. S1-S2 present, rhythmic, no gallops, rubs, or murmurs. Rigth lower extremity edema, ++/+++ no pitting. Pulmonary: decreased breath sounds bilaterally, poor air movement, no wheezing, positive rhonchi and rales, at the right base. Gastrointestinal. Abdomen with mild distention, no organomegaly, non tender, no rebound or guarding/ gastric tube in place.  Skin. No rashes Musculoskeletal: no joint deformities     Data Reviewed: I have personally reviewed following labs and imaging studies  CBC: Recent Labs  Lab 05/11/17 1054 05/12/17 0243 05/13/17 0227 05/17/17 1317  WBC 7.7 8.3 8.2 21.4*  NEUTROABS  --   --   --  19.2*  HGB 7.4* 7.9* 7.8* 8.9*  HCT 23.5* 25.1* 24.3* 28.1*  MCV 82.2 81.8 82.4 82.9  PLT 228 238 255 527   Basic Metabolic Panel: Recent Labs  Lab 05/11/17 1054 05/12/17 0243 05/13/17 0227 05/17/17 1317  NA 142 146* 144 148*  K 3.5 3.5 3.8 4.0  CL 113* 117* 118* 117*  CO2 19* 17*  15* 21*  GLUCOSE 434* 147* 172* 69  BUN 60* 59* 49* 27*  CREATININE 3.12* 3.31* 2.88* 2.09*  CALCIUM 7.8* 8.1* 7.8* 8.9  MG 2.3 2.3  --   --    GFR: Estimated Creatinine Clearance: 33.6 mL/min (A) (by C-G formula based on SCr of 2.09 mg/dL (H)). Liver Function Tests: Recent Labs  Lab 05/17/17 1317  AST 68*  ALT 38  ALKPHOS 327*  BILITOT 0.4  PROT 7.0  ALBUMIN 2.3*   No results for input(s): LIPASE, AMYLASE in the last 168 hours. No results for input(s): AMMONIA in the last 168 hours. Coagulation Profile: No results for input(s): INR, PROTIME in the last 168 hours. Cardiac Enzymes: No results for input(s): CKTOTAL, CKMB, CKMBINDEX, TROPONINI in the last 168 hours. BNP (last 3 results) No results for input(s): PROBNP in the last 8760 hours. HbA1C: No results for input(s): HGBA1C in the last 72 hours. CBG: Recent Labs  Lab 05/17/17 2222 05/17/17 2315 05/18/17 0318 05/18/17 0731 05/18/17 0759  GLUCAP 133* 141* 156* 163* 161*   Lipid Profile: No results for input(s): CHOL, HDL, LDLCALC, TRIG, CHOLHDL, LDLDIRECT in the last 72 hours. Thyroid Function Tests: No results for input(s): TSH, T4TOTAL, FREET4, T3FREE, THYROIDAB in the last 72 hours. Anemia Panel: No results for input(s): VITAMINB12,  FOLATE, FERRITIN, TIBC, IRON, RETICCTPCT in the last 72 hours.    Radiology Studies: I have reviewed all of the imaging during this hospital visit personally     Scheduled Meds: . aspirin  325 mg Per Tube Daily  . atorvastatin  40 mg Per Tube q1800  . cloNIDine  0.3 mg Oral TID  . enoxaparin (LOVENOX) injection  40 mg Subcutaneous QHS  . folic acid  1 mg Per Tube Daily  . free water  400 mL Per Tube Q6H  . insulin aspart  0-9 Units Subcutaneous TID WC  . labetalol  200 mg Per Tube Q8H  . levETIRAcetam  500 mg Per Tube BID  . thiamine  100 mg Per Tube Daily   Continuous Infusions: . ceFEPime (MAXIPIME) IV Stopped (05/17/17 2225)  . [START ON 05/19/2017] vancomycin         LOS: 1 day        Tawni Millers, MD [facility] Pager [phone]

## 2017-05-18 NOTE — Progress Notes (Signed)
Initial Nutrition Assessment  INTERVENTION:   -Initiate Osmolite 1.5 @ 20 ml/hr via PEG. -Will monitor for GOC prior to advancement. -Goal rate would be Osmolite 1.5 @ 55 ml/hr w/ 30 ml Prostat daily, provides 2080 kcal, 97g protein and 1005 ml H2O.  NUTRITION DIAGNOSIS:   Increased nutrient needs related to wound healing as evidenced by estimated needs.  GOAL:   Patient will meet greater than or equal to 90% of their needs  MONITOR:   Labs, Weight trends, TF tolerance, Skin, I & O's  REASON FOR ASSESSMENT:   Consult Enteral/tube feeding initiation and management  ASSESSMENT:   67 year old male who presented with hypoxemia and hypoglycemia.  He does have a significant past medical history for history of CVA with right hemiparesis, anemia of chronic disease, hypertension, type 2 diabetes mellitus, seizures, and the right sided basilic vein nonocclusive DVT.  Patient s/p PEG placement 2/26. Pt was just recently discharged from [facility] on 3/12. TF were stopped on 3/8 during that admission to allow patient to consume PO orally. Pt was recommended to follow a dysphagia 1 diet with honey thick liquids per SLP.  Pt was not consuming enough PO PTA. TF to be resumed via PEG this admission. Pt in room with no family at bedside. Pt nonverbal at this time.  Noted that palliative care consulted for [place] decisions. Will start TF of Osmolite 1.5 @ 20 ml/hr and advancement per GOC.  Per chart review, weight is up. Most likely related to edema. Suspect edema is also masking muscle/fat depletions.  Medications: Folic acid tablet daily, Free water 400 ml every 6 hours (1600 ml total), Thiamine tablet daily  Labs reviewed: CBGs: 161-163 GFR: 32  NUTRITION - FOCUSED PHYSICAL EXAM:    Most Recent Value  Orbital Region  No depletion  Upper Arm Region  No depletion  Thoracic and Lumbar Region  Unable to assess  Buccal Region  No depletion  Temple Region  No depletion  Clavicle Bone Region  No  depletion  Clavicle and Acromion Bone Region  No depletion  Scapular Bone Region  No depletion  Dorsal Hand  No depletion  Patellar Region  Unable to assess  Anterior Thigh Region  Unable to assess  Posterior Calf Region  Unable to assess  Edema (RD Assessment)  Moderate       Diet Order:  Diet NPO time specified Aspiration precautions  EDUCATION NEEDS:   Not appropriate for education at this time  Skin:  Skin Assessment: Skin Integrity Issues: Skin Integrity Issues:: Stage II, Other (Comment) Stage II: sacrum Other: laceration of head  Last BM:  PTA  Height:   Ht Readings from Last 1 Encounters:  05/17/17 5\' 8"  (1.727 m)    Weight:   Wt Readings from Last 1 Encounters:  05/17/17 172 lb (78 kg)    Ideal Body Weight:  66.5 kg(adjusted for hemiparesis)  BMI:  Body mass index is 26.15 kg/m.  Estimated Nutritional Needs:   Kcal:  1900-2100  Protein:  95-105g  Fluid:  2L/day  Clayton Bibles, MS, RD, LDN [place] Diet[facility] Pager: [phone] After Hours Pager: [phone]

## 2017-05-19 ENCOUNTER — Other Ambulatory Visit: Payer: Self-pay

## 2017-05-19 DIAGNOSIS — Z515 Encounter for palliative care: Secondary | ICD-10-CM

## 2017-05-19 DIAGNOSIS — E871 Hypo-osmolality and hyponatremia: Secondary | ICD-10-CM

## 2017-05-19 DIAGNOSIS — J181 Lobar pneumonia, unspecified organism: Principal | ICD-10-CM

## 2017-05-19 DIAGNOSIS — N183 Chronic kidney disease, stage 3 (moderate): Secondary | ICD-10-CM

## 2017-05-19 LAB — GLUCOSE, CAPILLARY
GLUCOSE-CAPILLARY: 209 mg/dL — AB (ref 65–99)
GLUCOSE-CAPILLARY: 267 mg/dL — AB (ref 65–99)
Glucose-Capillary: 168 mg/dL — ABNORMAL HIGH (ref 65–99)
Glucose-Capillary: 223 mg/dL — ABNORMAL HIGH (ref 65–99)
Glucose-Capillary: 232 mg/dL — ABNORMAL HIGH (ref 65–99)
Glucose-Capillary: 229 mg/dL — ABNORMAL HIGH (ref 65–99)

## 2017-05-19 LAB — BASIC METABOLIC PANEL
ANION GAP: 9 (ref 5–15)
BUN: 34 mg/dL — ABNORMAL HIGH (ref 6–20)
CALCIUM: 7.9 mg/dL — AB (ref 8.9–10.3)
CO2: 16 mmol/L — AB (ref 22–32)
Chloride: 115 mmol/L — ABNORMAL HIGH (ref 101–111)
Creatinine, Ser: 2.67 mg/dL — ABNORMAL HIGH (ref 0.61–1.24)
GFR, EST AFRICAN AMERICAN: 27 mL/min — AB (ref >60)
GFR, EST NON AFRICAN AMERICAN: 23 mL/min — AB (ref >60)
Glucose, Bld: 218 mg/dL — ABNORMAL HIGH (ref 65–99)
Potassium: 4.1 mmol/L (ref 3.5–5.1)
SODIUM: 140 mmol/L (ref 135–145)

## 2017-05-19 LAB — PHOSPHORUS
PHOSPHORUS: 3.4 mg/dL (ref 2.5–4.6)
PHOSPHORUS: 3.6 mg/dL (ref 2.5–4.6)

## 2017-05-19 LAB — CBC WITH DIFFERENTIAL/PLATELET
BASOS ABS: 0 10*3/uL (ref 0.0–0.1)
BASOS PCT: 0 %
Eosinophils Absolute: 0.2 10*3/uL (ref 0.0–0.7)
Eosinophils Relative: 1 %
HCT: 20.3 % — ABNORMAL LOW (ref 39.0–52.0)
HEMOGLOBIN: 6.5 g/dL — AB (ref 13.0–17.0)
Lymphocytes Relative: 10 %
Lymphs Abs: 2.2 10*3/uL (ref 0.7–4.0)
MCH: 26.6 pg (ref 26.0–34.0)
MCHC: 32 g/dL (ref 30.0–36.0)
MCV: 83.2 fL (ref 78.0–100.0)
MONOS PCT: 4 %
Monocytes Absolute: 0.8 10*3/uL (ref 0.1–1.0)
NEUTROS ABS: 19.4 10*3/uL — AB (ref 1.7–7.7)
NEUTROS PCT: 85 %
Platelets: 246 10*3/uL (ref 150–400)
RBC: 2.44 MIL/uL — AB (ref 4.22–5.81)
RDW: 15.5 % (ref 11.5–15.5)
WBC: 22.7 10*3/uL — AB (ref 4.0–10.5)

## 2017-05-19 LAB — IRON AND TIBC
IRON: 14 ug/dL — AB (ref 45–182)
Saturation Ratios: 7 % — ABNORMAL LOW (ref 17.9–39.5)
TIBC: 206 ug/dL — ABNORMAL LOW (ref 250–450)
UIBC: 192 ug/dL

## 2017-05-19 LAB — FERRITIN: Ferritin: 395 ng/mL — ABNORMAL HIGH (ref 24–336)

## 2017-05-19 LAB — TRANSFERRIN: Transferrin: 147 mg/dL — ABNORMAL LOW (ref 180–329)

## 2017-05-19 LAB — MAGNESIUM
MAGNESIUM: 1.9 mg/dL (ref 1.7–2.4)
Magnesium: 1.8 mg/dL (ref 1.7–2.4)

## 2017-05-19 MED ORDER — AMLODIPINE BESYLATE 10 MG PO TABS
10.0000 mg | ORAL_TABLET | Freq: Every day | ORAL | Status: DC
  Administered 2017-05-19 – 2017-05-20 (×2): 10 mg via ORAL
  Filled 2017-05-19 (×2): qty 1

## 2017-05-19 MED ORDER — SODIUM CHLORIDE 0.9% FLUSH
10.0000 mL | INTRAVENOUS | Status: DC | PRN
  Administered 2017-05-20: 20 mL
  Filled 2017-05-19: qty 40

## 2017-05-19 MED ORDER — ENOXAPARIN SODIUM 30 MG/0.3ML ~~LOC~~ SOLN
30.0000 mg | Freq: Every day | SUBCUTANEOUS | Status: DC
  Administered 2017-05-19: 30 mg via SUBCUTANEOUS
  Filled 2017-05-19: qty 0.3

## 2017-05-19 MED ORDER — PIPERACILLIN-TAZOBACTAM 3.375 G IVPB
3.3750 g | Freq: Three times a day (TID) | INTRAVENOUS | Status: DC
  Administered 2017-05-20 (×2): 3.375 g via INTRAVENOUS
  Filled 2017-05-19 (×2): qty 50

## 2017-05-19 MED ORDER — CLONIDINE HCL 0.2 MG PO TABS
0.3000 mg | ORAL_TABLET | Freq: Three times a day (TID) | ORAL | Status: DC
  Administered 2017-05-19 – 2017-05-20 (×2): 0.3 mg
  Filled 2017-05-19 (×2): qty 1

## 2017-05-19 NOTE — Progress Notes (Signed)
PROGRESS NOTE    Richard Bennett  ZYS:[id number] DOB: Jun 02, 1950 DOA: 05/17/2017 PCP: [name], MD    Brief Narrative:  67 year old male who presented with hypoxemia and hypoglycemia.  He does have a significant past medical history for history of CVA with right hemiparesis, anemia of chronic disease, hypertension, type 2 diabetes mellitus, seizures, and the right sided basilic vein nonocclusive DVT.  Patient was brought from the skilled nursing facility with significant confusion.  On the initial physical examination temperature 97.8, blood pressure 156/121, heart rate 91, respiratory rate 20, oxygen saturation 87% on nonrebreather mask.  Dry mucous membranes, lungs with rhonchi bilaterally, predominantly on the right, heart S1-S2 present, regular, abdomen was soft and nontender, G-tube in place, no lower extremity edema.  Sodium 148, potassium 4.0, chloride 117, bicarb 21, glucose 69, BUN 27, creatinine 2.09, ABG, 7.44/ 25.2/ 54.1/ 17/ 88%.  White count 21.4, hemoglobin 8.9, hematocrit 28.1, platelets 379, influenza A and B negative, urinalysis pH 5.5, protein 300, specific gravity greater than 1.030, positive granular casts, RBCs 6-30, white cells 6-30.  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   Patient was admitted to the hospital with the working diagnosis of acute hypoxic respiratory failure due to right lower lobe pneumonia, complicated by acute metabolic encephalopathy and hypoglycemia.    Assessment & Plan:   Active Problems:   Hypertension associated with diabetes ([place])   Seizure ([place])   DM (diabetes mellitus), type 2, uncontrolled, periph vascular complic (HCC)   Anemia, chronic disease   CKD (chronic kidney disease) stage 3, GFR 30-59 ml/min (HCC)   Status epilepticus (HCC)   Acute respiratory failure  with hypoxia (HCC)   Acute encephalopathy   Cerebral thrombosis with cerebral infarction   Dysphagia due to recent cerebrovascular accident (CVA)   S/P percutaneous endoscopic gastrostomy (PEG) tube placement ([place])   Dyslipidemia associated with type 2 diabetes mellitus ([place])   Unspecified severe protein-calorie malnutrition ([place])   HCAP (healthcare-associated pneumonia)   1.  Acute hypoxic respiratory failure. Improved oxygenating now up 100 on 2 LPM per nasal cannula. Aspiration precautions. Continue antibiotic therapy with IV Zosyn. WBC trending down to 22.7, cultures continue to be no growth, afebrile for the last 24 hours. Will continue telemetry monitoring and transfer out of the step down unit. Continue gentle IV fluids with half normal saline, will decrease rate to 50 ml per hour.   2.  Metabolic encephalopathy, in the setting of old CVA. Suspected to be at his baseline, will reach his family for more information, continue tube feedings and aspiration precautions, neuro checks per unit protocol.  3.  Hypoglycemia. Capillary glucose 168, 191, 224, 209, 223. Tolerating well tube feedings, will continue insulin sliding scale for glucose cover and monitoring, will hold on long acting insulin for the next 24 hours. At home on 18 units of insulin glarvine  4.  AKI on chronic kidney disease stage III with hypernatremia and hyperchloremia. Improved renal function, with serum cr at 2,67, still not at baseline, K at 4,1, Na down to 140 and chloride at 115, will continue to follow renal function and electrolytes, avoid hypotension or nephrotoxic medications. Continue hydration with hypotonic saline at 50 ml per hour. Tolerating tube feedings well.   5.  History of CVA. Neuro checks per unit protocol, continue aspiration precautions. Suspected to be at his baseline. Need to address  code status. Continue with asa and statin therapy.   6.  Seizures. On Keppra, no evidence of clinical signs of  active seizures.    7. Hypertension. Systolic blood pressure 630, will continue clonidine and labetalol and will resume amlodipine.       8. Worsening multifactorial anemia. Hb and Hc at critical low 6,5 and 20.3. Will transfuse 2 units prbc and will check iron levels (sample pre-transfusion). No signs of active bleeding, will continue dvt px.    DVT prophylaxis: enoxaparin   Code Status:  full Family Communication: No family at the bedside Disposition Plan: snf when stable   Consultants:     Procedures:     Antimicrobials:   Zosyn    Subjective: Patient has open his eyes and tracts intermittently, this am not responsive, no nausea or vomiting, no apparent pain or dyspnea.   Objective: Vitals:   05/19/17 0300 05/19/17 0336 05/19/17 0400 05/19/17 0500  BP: (!) 183/75  (!) 181/72 (!) 183/76  Pulse:      Resp: 20  (!) 23 (!) 25  Temp:  99.3 F (37.4 C)    TempSrc:  Axillary    SpO2: 100%  100% 100%  Weight:  76.7 kg (169 lb 1.5 oz)    Height:        Intake/Output Summary (Last 24 hours) at 05/19/2017 0818 Last data filed at 05/19/2017 0550 Gross per 24 hour  Intake 2810 ml  Output 425 ml  Net 2385 ml   Filed Weights   05/17/17 1246 05/18/17 2100 05/19/17 0336  Weight: 78 kg (172 lb) 76.5 kg (168 lb 10.4 oz) 76.7 kg (169 lb 1.5 oz)    Examination:   General: deconditioned, not in pain or dyspnea Neurology:not responsive to pain stimuli E ENT: positive pallor, no icterus, oral mucosa moist Cardiovascular: No JVD. S1-S2 present, rhythmic, no gallops, rubs, or murmurs. No lower extremity edema. Pulmonary: decreased breath sounds bilaterally, no wheezing, positive bilateral rhonchi and rales. Gastrointestinal. Abdomen flat, no organomegaly, non tender, no rebound or guarding. Peg tube in place.  Skin. No rashes Musculoskeletal: no joint deformities     Data Reviewed: I have personally reviewed following labs and imaging studies  CBC: Recent Labs   Lab 05/13/17 0227 05/17/17 1317 05/18/17 0918 05/19/17 0527  WBC 8.2 21.4* 29.9* 22.7*  NEUTROABS  --  19.2* 26.9* 19.4*  HGB 7.8* 8.9* 7.1* 6.5*  HCT 24.3* 28.1* 22.3* 20.3*  MCV 82.4 82.9 81.4 83.2  PLT 255 379 295 160   Basic Metabolic Panel: Recent Labs  Lab 05/13/17 0227 05/17/17 1317 05/18/17 0918 05/18/17 1655 05/19/17 0527  NA 144 148* 145  --  140  K 3.8 4.0 4.4  --  4.1  CL 118* 117* 118*  --  115*  CO2 15* 21* 17*  --  16*  GLUCOSE 172* 69 172*  --  218*  BUN 49* 27* 29*  --  34*  CREATININE 2.88* 2.09* 2.30*  --  2.67*  CALCIUM 7.8* 8.9 7.9*  --  7.9*  MG  --   --  1.7 1.8 1.8  PHOS  --   --  4.1 3.6 3.4   GFR: Estimated Creatinine Clearance: 26.3 mL/min (A) (by C-G formula based on SCr of 2.67 mg/dL (H)). Liver Function Tests: Recent Labs  Lab 05/17/17 1317  AST 68*  ALT 38  ALKPHOS 327*  BILITOT 0.4  PROT 7.0  ALBUMIN 2.3*   No results for input(s): LIPASE, AMYLASE in the last  168 hours. No results for input(s): AMMONIA in the last 168 hours. Coagulation Profile: No results for input(s): INR, PROTIME in the last 168 hours. Cardiac Enzymes: No results for input(s): CKTOTAL, CKMB, CKMBINDEX, TROPONINI in the last 168 hours. BNP (last 3 results) No results for input(s): PROBNP in the last 8760 hours. HbA1C: No results for input(s): HGBA1C in the last 72 hours. CBG: Recent Labs  Lab 05/18/17 1157 05/18/17 1647 05/18/17 1915 05/18/17 2325 05/19/17 0331  GLUCAP 159* 168* 191* 224* 209*   Lipid Profile: No results for input(s): CHOL, HDL, LDLCALC, TRIG, CHOLHDL, LDLDIRECT in the last 72 hours. Thyroid Function Tests: No results for input(s): TSH, T4TOTAL, FREET4, T3FREE, THYROIDAB in the last 72 hours. Anemia Panel: No results for input(s): VITAMINB12, FOLATE, FERRITIN, TIBC, IRON, RETICCTPCT in the last 72 hours.    Radiology Studies: I have reviewed all of the imaging during this hospital visit personally     Scheduled Meds: .  aspirin  325 mg Per Tube Daily  . atorvastatin  40 mg Per Tube q1800  . cloNIDine  0.3 mg Oral TID  . enoxaparin (LOVENOX) injection  30 mg Subcutaneous QHS  . feeding supplement (OSMOLITE 1.5 CAL)  1,000 mL Per Tube Q24H  . folic acid  1 mg Per Tube Daily  . free water  400 mL Per Tube Q6H  . insulin aspart  0-9 Units Subcutaneous Q4H  . labetalol  200 mg Per Tube Q8H  . levETIRAcetam  500 mg Per Tube BID  . mouth rinse  15 mL Mouth Rinse BID  . thiamine  100 mg Per Tube Daily   Continuous Infusions: . sodium chloride Stopped (05/19/17 0550)  . piperacillin-tazobactam (ZOSYN)  IV 3.375 g (05/19/17 0550)     LOS: 2 days        Mauricio Gerome Apley, MD [facility] Pager [phone]

## 2017-05-19 NOTE — Progress Notes (Signed)
Inpatient Diabetes Program Recommendations  AACE/ADA: New Consensus Statement on Inpatient Glycemic Control (2015)  Target Ranges:  Prepandial:   less than 140 mg/dL      Peak postprandial:   less than 180 mg/dL (1-2 hours)      Critically ill patients:  140 - 180 mg/dL   Results for Richard Bennett, Richard Bennett (MRN [id number]) as of 05/19/2017 10:31  Ref. Range 05/17/2017 23:15 05/18/2017 03:18 05/18/2017 07:31 05/18/2017 07:59 05/18/2017 11:57 05/18/2017 16:47 05/18/2017 19:15  Glucose-Capillary Latest Ref Range: 65 - 99 mg/dL 141 (H) 156 (H) 163 (H) 161 (H) 159 (H) 168 (H) 191 (H)   Results for Richard Bennett, Richard Bennett (MRN [id number]) as of 05/19/2017 10:31  Ref. Range 05/18/2017 23:25 05/19/2017 03:31 05/19/2017 08:14  Glucose-Capillary Latest Ref Range: 65 - 99 mg/dL 224 (H) 209 (H) 223 (H)    Admit: Hypoxia/ Hypoglycemia  History: DM, CVA  SNF  DM Meds: Lantus 18 units QHS                  Novolog 5 units TID  Current Orders: Novolog Sensitive Correction Scale/ SSI (0-9 units) Q4 hours       MD- Note patient started on tube feeds- Osmolite 20cc/hr.  CBGs >200 mg/dl since midnight.  Please consider starting Lantus 10 units QHS (50% total home dose)     --Will follow patient during hospitalization--  Wyn Quaker RN, MSN, CDE Diabetes Coordinator Inpatient Glycemic Control Team Team Pager: [phone] (8a-5p)

## 2017-05-19 NOTE — Progress Notes (Signed)
Pharmacy: Lovenox dose adjustment:   LMWH dose decreased from 40 to 30 for CrCl < 30 ml/min. Hg down to 6.5 - MD notified by RN  Eudelia Bunch, Pharm.D. [phone] 05/19/2017 8:11 AM

## 2017-05-19 NOTE — Progress Notes (Signed)
Pt lost only IV access. IV team unable to start a new IV.  Midline IV order placed, awaiting placement.  MD made aware.

## 2017-05-19 NOTE — Progress Notes (Signed)
No charge note:   Chart reviewed and patient evaluated. He is not able to participate in [place]. Spoke with his sister in law- she and his brother can meet tomorrow at 2:30 pm with PMT for [place].   Mariana Kaufman, AGNP-C Palliative Medicine  Please call Palliative Medicine team phone with any questions [phone]. For individual providers please see AMION.

## 2017-05-19 NOTE — Progress Notes (Signed)
Palliative Medicine consult noted. Due to high referral volume, there may be a delay seeing this patient. Please call the Palliative Medicine Team office at [phone] if recommendations are needed in the interim.  Thank you for inviting [place] to see this patient.  Marjie Skiff Jayleen Afonso, RN, BSN, [facility] Palliative Medicine Team 05/19/2017 11:39 AM Office [phone]

## 2017-05-19 NOTE — Progress Notes (Signed)
CRITICAL VALUE ALERT  Critical Value:  Hgb 6.5  Date & Time Notied:  05/19/17  Provider Notified: paged [facility]  Orders Received/Actions taken: awaiting orders

## 2017-05-20 ENCOUNTER — Inpatient Hospital Stay (HOSPITAL_COMMUNITY): Payer: Medicare HMO

## 2017-05-20 DIAGNOSIS — J189 Pneumonia, unspecified organism: Secondary | ICD-10-CM

## 2017-05-20 DIAGNOSIS — J9601 Acute respiratory failure with hypoxia: Secondary | ICD-10-CM

## 2017-05-20 DIAGNOSIS — R569 Unspecified convulsions: Secondary | ICD-10-CM

## 2017-05-20 DIAGNOSIS — Z515 Encounter for palliative care: Secondary | ICD-10-CM

## 2017-05-20 DIAGNOSIS — Z7189 Other specified counseling: Secondary | ICD-10-CM

## 2017-05-20 DIAGNOSIS — N183 Chronic kidney disease, stage 3 (moderate): Secondary | ICD-10-CM

## 2017-05-20 DIAGNOSIS — G9340 Encephalopathy, unspecified: Secondary | ICD-10-CM

## 2017-05-20 LAB — CBC WITH DIFFERENTIAL/PLATELET
BASOS PCT: 0 %
Basophils Absolute: 0 10*3/uL (ref 0.0–0.1)
EOS ABS: 0.2 10*3/uL (ref 0.0–0.7)
Eosinophils Relative: 1 %
HEMATOCRIT: 20.5 % — AB (ref 39.0–52.0)
HEMOGLOBIN: 6.6 g/dL — AB (ref 13.0–17.0)
LYMPHS ABS: 0.7 10*3/uL (ref 0.7–4.0)
Lymphocytes Relative: 5 %
MCH: 26.9 pg (ref 26.0–34.0)
MCHC: 32.2 g/dL (ref 30.0–36.0)
MCV: 83.7 fL (ref 78.0–100.0)
Monocytes Absolute: 1.1 10*3/uL — ABNORMAL HIGH (ref 0.1–1.0)
Monocytes Relative: 8 %
NEUTROS ABS: 11.7 10*3/uL — AB (ref 1.7–7.7)
NEUTROS PCT: 86 %
Platelets: 261 10*3/uL (ref 150–400)
RBC: 2.45 MIL/uL — AB (ref 4.22–5.81)
RDW: 15.3 % (ref 11.5–15.5)
WBC: 13.7 10*3/uL — AB (ref 4.0–10.5)

## 2017-05-20 LAB — BASIC METABOLIC PANEL
ANION GAP: 10 (ref 5–15)
BUN: 31 mg/dL — ABNORMAL HIGH (ref 6–20)
CALCIUM: 8.1 mg/dL — AB (ref 8.9–10.3)
CHLORIDE: 111 mmol/L (ref 101–111)
CO2: 17 mmol/L — AB (ref 22–32)
CREATININE: 2.62 mg/dL — AB (ref 0.61–1.24)
GFR calc non Af Amer: 24 mL/min — ABNORMAL LOW (ref >60)
GFR, EST AFRICAN AMERICAN: 28 mL/min — AB (ref >60)
Glucose, Bld: 220 mg/dL — ABNORMAL HIGH (ref 65–99)
Potassium: 4.3 mmol/L (ref 3.5–5.1)
SODIUM: 138 mmol/L (ref 135–145)

## 2017-05-20 LAB — GLUCOSE, CAPILLARY
GLUCOSE-CAPILLARY: 207 mg/dL — AB (ref 65–99)
GLUCOSE-CAPILLARY: 225 mg/dL — AB (ref 65–99)
Glucose-Capillary: 210 mg/dL — ABNORMAL HIGH (ref 65–99)
Glucose-Capillary: 159 mg/dL — ABNORMAL HIGH (ref 65–99)

## 2017-05-20 MED ORDER — SODIUM CHLORIDE 0.9% FLUSH: 3.0000 mL | INTRAVENOUS | Status: DC | PRN

## 2017-05-20 MED ORDER — SODIUM CHLORIDE 0.9% FLUSH: 3.0000 mL | Freq: Two times a day (BID) | INTRAVENOUS | Status: DC

## 2017-05-20 MED ORDER — ACETAMINOPHEN 10 MG/ML IV SOLN
1000.0000 mg | Freq: Once | INTRAVENOUS | Status: AC
  Administered 2017-05-20: 1000 mg via INTRAVENOUS
  Filled 2017-05-20: qty 100

## 2017-05-20 MED ORDER — LORAZEPAM 2 MG/ML IJ SOLN: 1.0000 mg | INTRAMUSCULAR | Status: DC | PRN

## 2017-05-20 MED ORDER — MORPHINE 100MG IN NS 100ML (1MG/ML) PREMIX INFUSION
2.0000 mg/h | INTRAVENOUS | Status: DC
  Administered 2017-05-20: 1 mg/h via INTRAVENOUS
  Filled 2017-05-20: qty 100

## 2017-05-20 MED ORDER — MORPHINE BOLUS VIA INFUSION
2.0000 mg | INTRAVENOUS | Status: DC | PRN
  Administered 2017-05-21: 2 mg via INTRAVENOUS
  Filled 2017-05-20: qty 2

## 2017-05-20 MED ORDER — SODIUM CHLORIDE 0.9 % IV SOLN: 250.0000 mL | INTRAVENOUS | Status: DC | PRN

## 2017-05-20 MED ORDER — GLYCOPYRROLATE 0.2 MG/ML IJ SOLN
0.2000 mg | INTRAMUSCULAR | Status: DC | PRN
Start: 2017-05-20 — End: 2017-05-21
  Administered 2017-05-20: 0.2 mg via INTRAVENOUS
  Filled 2017-05-20: qty 1

## 2017-05-20 NOTE — Progress Notes (Signed)
Pt's BP is currently 172/72. On call Baltazar Najjar made aware.

## 2017-05-20 NOTE — Consult Note (Addendum)
Consultation Note Date: 05/20/2017   Patient Name: Richard Bennett  DOB: 1950/08/20  MRN: [id number]  Age / Sex: 67 y.o., male  PCP: [name], MD Referring Physician: Patrecia Pour, Christean Grief, MD  Reason for Consultation: Establishing goals of care  HPI/Patient Profile: 67 y.o. male  with past medical history of L MCA stroke, HTN, DM2, HLD, colon polyps, substance abuse (cocaine, benzodiazipine), bilateral watershed infarcts with severe deficits (PEG, aphasia, dysphasia, R side flaccid, L weakness), carotid stenosis, seizures admitted on 05/17/2017 with hypoxia, hypoglycemia, AMS. Chest xray shows RLL consolidation- worsening since admission. CT scan shows subdural hematoma/hygroma with 46m shift. Albumin on admission 2.3, continuing to spike temps despite antibiotic treatments, Cr trending up, today showing Kussmaul's respirations. Palliative medicine consulted for [place]   Clinical Assessment and Goals of Care:  I have reviewed medical records including EPIC notes, labs and imaging,  assessed the patient and then met in conference with patient's brother Richard Cancerand via speakerphone with brother Richard Bennett to discuss diagnosis prognosis, GOC, EOL wishes, disposition and options. Patient has an estranged son who he has not been in contact with. His brother's have been his primary caregivers and decision makers.   I introduced Palliative Medicine as specialized medical care for people living with serious illness. It focuses on providing relief from the symptoms and stress of a serious illness. The goal is to improve quality of life for both the patient and the family.  We discussed a brief life review of the patient. [place] not a person to generally take healthcare provider's advice. His brother describes him as "stubborn". He enjoyed working out and bike riding.   As far as functional and  nutritional status- [facility] has not been [facility] He has had PEG tube feedings and no PO intake. He has not been able to communicate. There have been reports that he has been falling out of his bed repeatedly.     We discussed their current illness and what it means in the larger context of their on-going co-morbidities.  Natural disease trajectory and expectations at EOL were discussed. His brothers recognize that he is at end of life. They feel that he would not want any further life prolonging measures and would want to have a peaceful transition into death.  The difference between aggressive medical intervention and comfort care was considered in light of the patient's goals of care.  Advanced directives, concepts specific to code status, artifical feeding and hydration, and rehospitalization were considered and discussed. Richard Cancerand [place] like full transition to comfort care and DNR status. They understand this to mean Do Not Resuscitate. They would like to transition care to focus on patient's comfort with giving patient pain medication. No further tube feeding, IV antibiotics or other medications that will prolong patient's life. They would like him supported through a peaceful dying process and understand the anticipated outcome is death.  Questions and concerns were addressed. review. The  family was encouraged to call with questions or concerns.   Primary Decision Maker NEXT OF KIN   SUMMARY OF RECOMMENDATIONS -DNR -Full Comfort Care -D/C cardiac monitor -D/C tube feeding, IV fluids, IV antibiotics and other medications not intended for comfort -Morphine infusion 56m/hr with 213mbolus q15 min prn for SOB or signs of discomfort -Lorazepam 36m19m6hr prn for signs of anxiety or agitation -Robinul .2mg74mhr prn excessive secretions    Code Status/Advance Care Planning:  DNR  Palliative Prophylaxis:   Frequent Pain Assessment and Turn  Reposition  Additional Recommendations (Limitations, Scope, Preferences):  Full Comfort Care  Prognosis:    Hours - Days  Discharge Planning: Anticipated Hospital Death  Primary Diagnoses: Present on Admission: . HCAP (healthcare-associated pneumonia) . Status epilepticus (HCC)[place]pecified severe protein-calorie malnutrition (HCC)[place]pertension associated with diabetes (HCC)[place]yslipidemia associated with type 2 diabetes mellitus (HCC)[place]DM (diabetes mellitus), type 2, uncontrolled, periph vascular complic (HCC)[place]D (chronic kidney disease) stage 3, GFR 30-59 ml/min (HCC) . Cerebral thrombosis with cerebral infarction . Anemia, chronic disease . Acute respiratory failure with hypoxia (HCC)[place]ute encephalopathy   I have reviewed the medical record, interviewed the patient and family, and examined the patient. The following aspects are pertinent.  Past Medical History:  Diagnosis Date  . Chest pain   . Colon polyps    adenomatous  . Hyperlipidemia   . Hypertension   . Refusal of blood transfusions as patient is Jehovah's Witness   . Stroke (HCC)[place]. Type II diabetes mellitus (HCC)[place] Social History   Socioeconomic History  . Marital status: Divorced    Spouse name: None  . Number of children: 4  . Years of education: 12  88Highest education level: None  Social Needs  . Financial resource strain: None  . Food insecurity - worry: None  . Food insecurity - inability: None  . Transportation needs - medical: None  . Transportation needs - non-medical: None  Occupational History  . Occupation: Truck - Local  Tobacco Use  . Smoking status: Former Smoker    Packs/day: 0.50    Years: 20.00    Pack years: 10.00    Types: Cigarettes    Last attempt to quit: 03/04/1978    Years since quitting: 39.2  . Smokeless tobacco: Never Used  Substance and Sexual Activity  . Alcohol use: Yes    Comment: 10/30/2015 "might have 2-3 drinks/year"  . Drug use: Yes    Types:  Heroin    Comment: 10/30/2015 "nothing in the 2000s"  . Sexual activity: No  Other Topics Concern  . None  Social History Narrative   Fun: Cycling    Lives alone.  Is retired. Education 12 th grade.  Single.  Children - 4.  He is Jehovah witness.    Family History  Problem Relation Age of Onset  . Diabetes Mother   . Stroke Mother   . Bennett Father   . Diabetes Brother   . Seizures Brother   . Colon Bennett Neg Hx   . Esophageal Bennett Neg Hx   . Stomach Bennett Neg Hx   . Rectal Bennett Neg Hx    Scheduled Meds: . amLODipine  10 mg Oral Daily  . aspirin  325 mg Per Tube Daily  . atorvastatin  40 mg Per Tube q1800  . cloNIDine  0.3 mg Per Tube TID  . enoxaparin (LOVENOX) injection  30 mg Subcutaneous QHS  .  feeding supplement (OSMOLITE 1.5 CAL)  1,000 mL Per Tube Q24H  . folic acid  1 mg Per Tube Daily  . free water  400 mL Per Tube Q6H  . insulin aspart  0-9 Units Subcutaneous Q4H  . labetalol  200 mg Per Tube Q8H  . levETIRAcetam  500 mg Per Tube BID  . mouth rinse  15 mL Mouth Rinse BID  . thiamine  100 mg Per Tube Daily   Continuous Infusions: . sodium chloride 10 mL/hr at 05/20/17 1357  . acetaminophen 1,000 mg (05/20/17 1454)  . piperacillin-tazobactam (ZOSYN)  IV Stopped (05/20/17 1357)   PRN Meds:.acetaminophen, alum & mag hydroxide-simeth, bisacodyl, docusate, polyethylene glycol, sodium chloride flush Medications Prior to Admission:  Prior to Admission medications   Medication Sig Start Date End Date Taking? Authorizing Provider  acetaminophen (TYLENOL) 160 MG/5ML solution Place 20.3 mLs (650 mg total) into feeding tube every 6 (six) hours as needed for mild pain or fever. 05/13/17  Yes [name], MD  alum & mag hydroxide-simeth (MAALOX/MYLANTA) 200-200-20 MG/5ML suspension Take 15 mLs by mouth every 4 (four) hours as needed for indigestion or heartburn. 05/13/17  Yes [name], MD  amLODipine (NORVASC) 10 MG tablet Take 10 mg by mouth daily.   Yes  [provider]  aspirin 325 MG tablet Place 1 tablet (325 mg total) into feeding tube daily. 05/14/17  Yes [name], MD  atorvastatin (LIPITOR) 40 MG tablet Place 1 tablet (40 mg total) into feeding tube daily at 6 PM. 05/13/17  Yes McClung, Kimberlee Nearing, MD  bisacodyl (DULCOLAX) 10 MG suppository Place 1 suppository (10 mg total) rectally daily as needed for moderate constipation. 05/13/17  Yes [name], MD  cloNIDine (CATAPRES) 0.3 MG tablet Take 0.3 mg by mouth 3 (three) times daily.   Yes [provider]  docusate (COLACE) 50 MG/5ML liquid Place 10 mLs (100 mg total) into feeding tube 2 (two) times daily as needed for mild constipation. 05/13/17  Yes [name], MD  folic acid (FOLVITE) 1 MG tablet Place 1 tablet (1 mg total) into feeding tube daily. 05/14/17  Yes [name], MD  hydrALAZINE (APRESOLINE) 10 MG tablet Take 10 mg by mouth every 6 (six) hours as needed (SBP >/= 190).   Yes [provider]  insulin aspart (NOVOLOG FLEXPEN) 100 UNIT/ML FlexPen Inject 5 Units into the skin. After meals   Yes [provider]  insulin glargine (LANTUS) 100 UNIT/ML injection Inject 0.18 mLs (18 Units total) into the skin daily. Patient taking differently: Inject 18 Units into the skin at bedtime.  05/14/17  Yes [name], MD  labetalol (NORMODYNE) 200 MG tablet Place 1 tablet (200 mg total) into feeding tube every 8 (eight) hours. 05/13/17  Yes [name], MD  levETIRAcetam (KEPPRA) 100 MG/ML solution Place 5 mLs (500 mg total) into feeding tube 2 (two) times daily. 05/13/17  Yes [name], MD  Multiple Vitamin (MULTIVITAMIN WITH MINERALS) TABS tablet Take 1 tablet by mouth daily. 05/14/17  Yes [name], MD  Nutritional Supplements (PROMOD) LIQD Give 30 cc by mouth three times daily for low albumin   Yes [provider]  polyethylene glycol (MIRALAX / GLYCOLAX) packet Place 17 g into feeding tube daily  as needed for severe constipation. 05/13/17  Yes [name], MD  thiamine 100 MG tablet Place 1 tablet (100 mg total) into feeding tube daily. 05/14/17  Yes [name],  MD  Water For Irrigation, Sterile (FREE WATER) SOLN Place 400 mLs into feeding tube every 6 (six) hours. 05/13/17  Yes [name], MD   No Known Allergies Review of Systems  Unable to perform ROS: Patient nonverbal    Physical Exam  Cardiovascular: Normal rate.  Pulmonary/Chest: Stridor present.  Kussmaul's respirations  Abdominal: Soft. Bowel sounds are normal.  Musculoskeletal: He exhibits edema.  Neurological:  Opens eyes to noxious stimuli, does not track  Skin: Skin is warm and dry.  Nursing note and vitals reviewed.   Vital Signs: BP (!) 152/66 (BP Location: Right Arm)   Pulse 74   Temp (!) 101 F (38.3 C) (Rectal)   Resp 18   Ht 5' 8"  (1.727 m)   Wt 78.8 kg (173 lb 11.6 oz)   SpO2 96%   BMI 26.41 kg/m  Pain Assessment: PAINAD   Pain Score: Asleep   SpO2: SpO2: 96 % O2 Device:SpO2: 96 % O2 Flow Rate: .O2 Flow Rate (L/min): 2 L/min  IO: Intake/output summary:   Intake/Output Summary (Last 24 hours) at 05/20/2017 1459 Last data filed at 05/20/2017 1200 Gross per 24 hour  Intake 1810.83 ml  Output 600 ml  Net 1210.83 ml    LBM: Last BM Date: 05/19/17 Baseline Weight: Weight: 78 kg (172 lb) Most recent weight: Weight: 78.8 kg (173 lb 11.6 oz)     Palliative Assessment/Data: PPS: 10%     Thank you for this consult. Palliative medicine will continue to follow and assist as needed.   Time In: 1430 Time Out: 1600 Time Total: 90 mins Prolong services billed: yes Greater than 50%  of this time was spent counseling and coordinating care related to the above assessment and plan.  Signed by: Mariana Kaufman, AGNP-C Palliative Medicine    Please contact Palliative Medicine Team phone at [phone] for questions and concerns.  For individual provider: See  Shea Evans

## 2017-05-20 NOTE — Progress Notes (Signed)
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  Patient was sent from skilled nursing facility due to significant change in mental status.  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  Patient was admitted with working diagnosis of acute respiratory failure with hypoxia due to right lower lobe pneumonia complicated with acute metabolic encephalopathy with hypoglycemia.   Subjective: Patient seen and examined, patient not responsive to verbal or tactile stimuli. Continues to spike fever.   Assessment & Plan: Acute hypoxic respiratory failure likely secondary to right lower lobe pneumonia Oxygenation improved but patient continues to spike fever, patient was treated with IV Zosyn.  WBC was trending down on culture shows no growth.  Despite treatment patient is not improving significantly, palliative care team was consulted and had a meeting with family who decided to make patient comfort care.  Acute metabolic encephalopathy in setting of old CVA Suspect this is baseline, per nursing home documentation patient is nonverbal expressive aphasia and right hemiparesis.  No active issue as patient is comfort care  Hypoglycemia CBGs have been improving and tolerating well tube feedings. Stop CBG monitoring and  insulin  Acute kidney injury on chronic kidney disease stage III with hypernatremia and hyperchloremia Renal function improved with IV fluids.  Patient tolerated tube feeds well. Avoid hypotension Comfort care measures only  Seizures Seizure precautions  Hypertension Monitor BP  Worsening multifactorial anemia Patient was not transfused due to ?  History of Jehovah's Witness.  Patient now comfort care and blood transfusion not indicated at this time.  Goal of care Patient with signs of multiorgan failure  due to possible infection in setting of very decondition gentleman.  Patient not responding well to therapy palliative care discussed with family, patient with almost no chance to have a meaningful recovery.  Family has decided to place patient in comfort measures.  We will continue morphine and Ativan for respiratory distress and pain.  DVT prophylaxis: None comfort measures Code Status: DNR Family Communication: None at bedside Disposition Plan: Possible hospice facility in the next 24-hour  Consultants:   Palliative care  Procedures:   None  Antimicrobials:  IV Zosyn   Objective: Vitals:   05/20/17 1116 05/20/17 1248 05/20/17 1313 05/20/17 1533  BP: (!) 156/67  (!) 152/66   Pulse: 78  74   Resp: 18  18   Temp: (!) 101.5 F (38.6 C) (!) 102.1 F (38.9 C) (!) 101 F (38.3 C) 100.3 F (37.9 C)  TempSrc: Axillary Axillary Rectal Rectal  SpO2: 97%  96%   Weight:      Height:        Intake/Output Summary (Last 24 hours) at 05/20/2017 1552 Last data filed at 05/20/2017 1200 Gross per 24 hour  Intake 1810.83 ml  Output 600 ml  Net 1210.83 ml   Filed Weights   05/18/17 2100 05/19/17 0336 05/20/17 0500  Weight: 76.5 kg (168 lb 10.4  oz) 76.7 kg (169 lb 1.5 oz) 78.8 kg (173 lb 11.6 oz)    Examination:  General exam: Unresponsive HEENT: Pupil sluggish Respiratory system: Decreased breath sounds bilaterally, bibasilar crackles Cardiovascular system: S1 & S2  heard, RRR. No JVD, murmurs, rubs or gallops Gastrointestinal system: Abdomen is nondistended, soft and nontender.  PEG tube in place. Central nervous system: Unresponsive Extremities: Bilateral lower extremity edema 2+ Skin: No rashes  Data Reviewed: I have personally reviewed following labs and imaging studies  CBC: Recent Labs  Lab 05/17/17 1317 05/18/17 0918 05/19/17 0527 05/20/17 0318  WBC 21.4* 29.9* 22.7* 13.7*  NEUTROABS 19.2* 26.9* 19.4* 11.7*  HGB 8.9* 7.1* 6.5* 6.6*  HCT 28.1* 22.3* 20.3* 20.5*  MCV 82.9 81.4 83.2 83.7  PLT 379 295 246 481   Basic Metabolic Panel: Recent Labs  Lab 05/17/17 1317 05/18/17 0918 05/18/17 1655 05/19/17 0527 05/19/17 1816 05/20/17 0318  NA 148* 145  --  140  --  138  K 4.0 4.4  --  4.1  --  4.3  CL 117* 118*  --  115*  --  111  CO2 21* 17*  --  16*  --  17*  GLUCOSE 69 172*  --  218*  --  220*  BUN 27* 29*  --  34*  --  31*  CREATININE 2.09* 2.30*  --  2.67*  --  2.62*  CALCIUM 8.9 7.9*  --  7.9*  --  8.1*  MG  --  1.7 1.8 1.8 1.9  --   PHOS  --  4.1 3.6 3.4 3.6  --    GFR: Estimated Creatinine Clearance: 26.8 mL/min (A) (by C-G formula based on SCr of 2.62 mg/dL (H)). Liver Function Tests: Recent Labs  Lab 05/17/17 1317  AST 68*  ALT 38  ALKPHOS 327*  BILITOT 0.4  PROT 7.0  ALBUMIN 2.3*   No results for input(s): LIPASE, AMYLASE in the last 168 hours. No results for input(s): AMMONIA in the last 168 hours. Coagulation Profile: No results for input(s): INR, PROTIME in the last 168 hours. Cardiac Enzymes: No results for input(s): CKTOTAL, CKMB, CKMBINDEX, TROPONINI in the last 168 hours. BNP (last 3 results) No results for input(s): PROBNP in the last 8760 hours. HbA1C: No results for input(s): HGBA1C in the last 72 hours. CBG: Recent Labs  Lab 05/19/17 2002 05/20/17 0008 05/20/17 0401 05/20/17 0732 05/20/17 1114  GLUCAP 267* 225* 210* 207* 159*   Lipid Profile: No results for input(s): CHOL, HDL,  LDLCALC, TRIG, CHOLHDL, LDLDIRECT in the last 72 hours. Thyroid Function Tests: No results for input(s): TSH, T4TOTAL, FREET4, T3FREE, THYROIDAB in the last 72 hours. Anemia Panel: Recent Labs    05/19/17 1033  FERRITIN 395*  TIBC 206*  IRON 14*   Sepsis Labs: Recent Labs  Lab 05/17/17 1421  LATICACIDVEN 1.64    Recent Results (from the past 240 hour(s))  Blood Culture (routine x 2)     Status: None (Preliminary result)   Collection Time: 05/17/17  2:15 PM  Result Value Ref Range Status   Specimen Description   Final    BLOOD LEFT ANTECUBITAL Performed at [facility] [address], [place], [place] [address]    Special Requests   Final    BOTTLES DRAWN AEROBIC AND ANAEROBIC Blood Culture adequate volume Performed at [facility] [address], [place], [place] [address]    Culture   Final    NO GROWTH 3 DAYS Performed at  [facility], [place] [address], [place], [place] [address]    Report Status PENDING  Incomplete  Blood Culture (routine x 2)     Status: None (Preliminary result)   Collection Time: 05/17/17  2:15 PM  Result Value Ref Range Status   Specimen Description   Final    BLOOD LEFT ANTECUBITAL Performed at [facility] [address], [place], [place] [address]    Special Requests   Final    BOTTLES DRAWN AEROBIC AND ANAEROBIC Blood Culture adequate volume Performed at [facility] [address], [place], [place] [address]    Culture   Final    NO GROWTH 2 DAYS Performed at [facility] [address] [place], [place] [address]    Report Status PENDING  Incomplete  MRSA PCR Screening     Status: None   Collection Time: 05/18/17  3:28 AM  Result Value Ref Range Status   MRSA by PCR NEGATIVE NEGATIVE Final    Comment:        The GeneXpert MRSA Assay (FDA approved for NASAL specimens only), is one component of a comprehensive MRSA colonization surveillance  program. It is not intended to diagnose MRSA infection nor to guide or monitor treatment for MRSA infections. Performed at [facility], [place] [address], [place], [place] [address]     Radiology Studies: Dg Chest Port 1 View  Result Date: 05/20/2017 CLINICAL DATA:  Fever EXAM: PORTABLE CHEST 1 VIEW COMPARISON:  Chest radiograph 05/17/2017 FINDINGS: Worsening opacities in the right lower lobe with associated small pleural effusion. Mild cardiomegaly is unchanged.No pneumothorax. IMPRESSION: Worsening right lower lobe consolidation with development of small right pleural effusion. Electronically Signed   By: Ulyses Jarred M.D.   On: 05/20/2017 14:25   Scheduled Meds: . amLODipine  10 mg Oral Daily  . aspirin  325 mg Per Tube Daily  . atorvastatin  40 mg Per Tube q1800  . cloNIDine  0.3 mg Per Tube TID  . enoxaparin (LOVENOX) injection  30 mg Subcutaneous QHS  . feeding supplement (OSMOLITE 1.5 CAL)  1,000 mL Per Tube Q24H  . folic acid  1 mg Per Tube Daily  . free water  400 mL Per Tube Q6H  . insulin aspart  0-9 Units Subcutaneous Q4H  . labetalol  200 mg Per Tube Q8H  . levETIRAcetam  500 mg Per Tube BID  . mouth rinse  15 mL Mouth Rinse BID  . thiamine  100 mg Per Tube Daily   Continuous Infusions: . sodium chloride 10 mL/hr at 05/20/17 1357  . piperacillin-tazobactam (ZOSYN)  IV Stopped (05/20/17 1357)     LOS: 3 days   Time spent: Total of 35 minutes spent with pt, greater than 50% of which was spent in discussion of  treatment, counseling and coordination of care  Chipper Oman, MD Pager: Text Page via [email or web address]   If 7PM-7AM, please contact night-coverage [email or web address] 05/20/2017, 3:52 PM   Note - This record has been created using [employer]. Chart creation errors have been sought, but may not always have been located. Such creation errors do not reflect on the standard of medical care.

## 2017-05-20 NOTE — Progress Notes (Signed)
MD made aware Pt's temperature 101.5 axillary. Tylenol via tube given and MD to make rounds on Pt.

## 2017-05-20 NOTE — Progress Notes (Signed)
Rectal Temperature 100.3. Family is here for goals of care meeting.

## 2017-05-20 NOTE — Progress Notes (Signed)
Inpatient Diabetes Program Recommendations  AACE/ADA: New Consensus Statement on Inpatient Glycemic Control (2015)  Target Ranges:  Prepandial:   less than 140 mg/dL      Peak postprandial:   less than 180 mg/dL (1-2 hours)      Critically ill patients:  140 - 180 mg/dL   Results for AMERY, MINASYAN (MRN [id number]) as of 05/20/2017 08:07  Ref. Range 05/18/2017 23:25 05/19/2017 03:31 05/19/2017 08:14 05/19/2017 12:31 05/19/2017 18:26 05/19/2017 20:02  Glucose-Capillary Latest Ref Range: 65 - 99 mg/dL 224 (H) 209 (H) 223 (H) 232 (H) 229 (H) 267 (H)   Results for KAIMEN, PEINE (MRN [id number]) as of 05/20/2017 08:07  Ref. Range 05/20/2017 00:08 05/20/2017 04:01 05/20/2017 07:32  Glucose-Capillary Latest Ref Range: 65 - 99 mg/dL 225 (H) 210 (H) 207 (H)      Admit: Hypoxia/ Hypoglycemia  History: DM, CVA  SNF  DM Meds: Lantus 18 units QHS                             Novolog 5 units TID  Current Orders: Novolog Sensitive Correction Scale/ SSI (0-9 units) Q4 hours       MD- Note patient started on tube feeds- Osmolite 20cc/hr.  CBGs >200 mg/dl since yesterday.  Please consider starting Lantus 10 units QHS (50% total home dose)  Please also consider increasing Novolog SSI to Moderate scale (0-15 units) Q4 hours      --Will follow patient during hospitalization--  Wyn Quaker RN, MSN, CDE Diabetes Coordinator Inpatient Glycemic Control Team Team Pager: [phone] (8a-5p)

## 2017-05-20 NOTE — Progress Notes (Signed)
MD updated Pt's temperature Axillary 102.1 and Rectally 101.0 MD to place new orders.

## 2017-05-20 NOTE — Care Management Important Message (Signed)
Important Message  Patient Details  Name: [name] MRN: [id number] Date of Birth: 1950/12/11   Medicare Important Message Given:  Yes    Kerin Salen 05/20/2017, 12:05 [place] Message  Patient Details  Name: [name] MRN: [id number] Date of Birth: [birth year]   Medicare Important Message Given:  Yes    Kerin Salen 05/20/2017, 12:05 PM

## 2017-05-21 DIAGNOSIS — E11649 Type 2 diabetes mellitus with hypoglycemia without coma: Secondary | ICD-10-CM

## 2017-05-21 DIAGNOSIS — E1165 Type 2 diabetes mellitus with hyperglycemia: Secondary | ICD-10-CM

## 2017-05-21 DIAGNOSIS — E1151 Type 2 diabetes mellitus with diabetic peripheral angiopathy without gangrene: Secondary | ICD-10-CM

## 2017-05-21 MED ORDER — MORPHINE 100MG IN NS 100ML (1MG/ML) PREMIX INFUSION: 2.0000 mg/h | INTRAVENOUS | Status: AC

## 2017-05-21 MED ORDER — MORPHINE BOLUS VIA INFUSION
4.0000 mg | INTRAVENOUS | Status: DC | PRN
  Administered 2017-05-21: 4 mg via INTRAVENOUS
  Filled 2017-05-21: qty 4

## 2017-05-21 MED ORDER — LORAZEPAM 2 MG/ML IJ SOLN: 1.0000 mg | INTRAMUSCULAR | 0 refills | Status: AC | PRN

## 2017-05-21 MED ORDER — GLYCOPYRROLATE 0.2 MG/ML IJ SOLN: 0.2000 mg | INTRAMUSCULAR | Status: AC | PRN

## 2017-05-21 NOTE — Progress Notes (Signed)
Report given to Caryl Asp, RN at [facility]. Pt will be transported via [facility].

## 2017-05-21 NOTE — Care Management Note (Signed)
Case Management Note  Patient Details  Name: Richard Bennett MRN: [id number] Date of Birth: [birth year]  Subjective/Objective:   CSW following.                 Action/Plan:d/c hospice medical facility.   Expected Discharge Date:  05/21/17               Expected Discharge Plan:  [place]  In-House Referral:  Clinical Social Work  Discharge planning Services  CM Consult  Post Acute Care Choice:    Choice offered to:     DME Arranged:    DME Agency:     HH Arranged:    [place] Agency:     Status of Service:  Completed, signed off  If discussed at [employer], dates discussed:    Additional Comments:  Dessa Phi, RN 05/21/2017, 2:25 PM

## 2017-05-21 NOTE — Progress Notes (Signed)
CSW contacted patient's son Domenica Fail [phone]) to confirm patient's discharge plan, no answer. CSW left voicemail requesting return phone call.    Patient's brother to meet with [facility] liaison.   Abundio Miu, [place] Social Worker [facility] Cell#: [phone]

## 2017-05-21 NOTE — Clinical Social Work Note (Addendum)
Clinical Social Work Assessment  Patient Details  Name: Richard Bennett MRN: [id number] Date of Birth: [birth year]  Date of referral:  05/21/17               Reason for consult:  End of Life/Hospice                Permission sought to share information with:    Permission granted to share information::     Name::        Agency::     Relationship::     Contact Information:     Housing/Transportation Living arrangements for the past 2 months:    Source of Information:  Siblings Patient Interpreter Needed:  None Criminal Activity/Legal Involvement Pertinent to Current Situation/Hospitalization:  No - Comment as needed Significant Relationships:  Siblings Lives with:  Facility Resident Do you feel safe going back to the place where you live?    Need for family participation in patient care:  Yes (Comment)  Care giving concerns: CSW consulted for residential hospice referral ([name]).   Social Worker assessment / plan:  Per chart review, palliative met with patient's brother and recommended residential hospice. CSW spoke with patient's brother Richard Bennett [phone]) regarding patient's discharge plans and consult for residential hospice. Patient's brother reported that they are agreeable to residential hospice and that the preference is [name]. Patient's brother reported that patient's son is aware and in agreement with discharge plans. Patient's brother reported that he has been primary Media planner for patient.   CSW contacted patient's son/next of kin Richard Bennett [phone]) to discuss patient's discharge plans, patient's wife answered and reported that CSW can contact patient's son at 12:30pm. Patient's son's wife reported that patient does not have a phone and that she is at work. CSW will attempt to reach patient' son again.  CSW made referral to [name] liaison.   CSW will continue to follow and assist with discharge planning.   Employment  status:  Unemployed Nurse, adult PT Recommendations:  Not assessed at this time Information / Referral to community resources:  Other (Comment Required)(Residential Hospice ([facility]))  Patient/Family's Response to care:  Patient's brother appreciative of CSW assistance with discharge planning.   Patient/Family's Understanding of and Emotional Response to Diagnosis, Current Treatment, and Prognosis:  Patient unresponsive and unable to participate in assessment. Patient's brother involved in patient's care and verbalized plan for patient to discharge to residential hospice.   Emotional Assessment Appearance:    Attitude/Demeanor/Rapport:  Unresponsive, Unable to Assess Affect (typically observed):  Unable to Assess Orientation:  (disoriented) Alcohol / Substance use:  Not Applicable Psych involvement (Current and /or in the community):  No (Comment)  Discharge Needs  Concerns to be addressed:  Care Coordination Readmission within the last 30 days:  Yes Current discharge risk:  Terminally ill Barriers to Discharge:  No Barriers Identified   Burnis Medin, LCSW 05/21/2017, 10:51 AM

## 2017-05-21 NOTE — Discharge Summary (Signed)
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 function deficit following -- Impact on safety and function Severe aspiration risk   CHL IP TREATMENT RECOMMENDATION 05/02/2017 Treatment Recommendations Therapy as outlined in treatment plan below   Prognosis 05/02/2017 Prognosis for Safe Diet Advancement Fair Barriers to Reach Goals Cognitive deficits;Severity of deficits Barriers/Prognosis Comment -- CHL IP DIET RECOMMENDATION 05/02/2017 SLP Diet Recommendations NPO Liquid Administration via -- Medication Administration Via alternative means Compensations -- Postural Changes --   CHL IP OTHER RECOMMENDATIONS 05/02/2017 Recommended Consults -- Oral Care Recommendations Oral care QID Other Recommendations --   CHL IP FOLLOW UP RECOMMENDATIONS 05/02/2017 Follow up Recommendations Skilled Nursing facility   [facility] IP FREQUENCY AND DURATION 05/02/2017 Speech Therapy Frequency (ACUTE ONLY) min 2x/week Treatment Duration 2 weeks      CHL IP ORAL PHASE 05/02/2017 Oral Phase Impaired Oral - Pudding Teaspoon -- Oral - Pudding Cup -- Oral - Honey Teaspoon -- Oral - Honey Cup Right pocketing in lateral sulci;Left pocketing in lateral sulci;Pocketing in anterior sulcus;Lingual/palatal residue;Reduced posterior propulsion Oral - Nectar Teaspoon --  Oral - Nectar Cup -- Oral - Nectar Straw -- Oral - Thin Teaspoon -- Oral - Thin Cup -- Oral - Thin Straw -- Oral - Puree Right pocketing in lateral sulci;Left pocketing in lateral sulci;Pocketing in anterior sulcus;Lingual/palatal residue;Reduced posterior propulsion;Premature spillage Oral - Mech Soft -- Oral - Regular -- Oral - Multi-Consistency -- Oral - Pill -- Oral Phase - Comment --  CHL IP PHARYNGEAL PHASE 05/02/2017 Pharyngeal Phase Impaired Pharyngeal- Pudding Teaspoon -- Pharyngeal -- Pharyngeal- Pudding Cup -- Pharyngeal -- Pharyngeal- Honey Teaspoon -- Pharyngeal -- Pharyngeal- Honey Cup Pharyngeal residue - valleculae Pharyngeal -- Pharyngeal- Nectar Teaspoon -- Pharyngeal -- Pharyngeal- Nectar Cup -- Pharyngeal -- Pharyngeal- Nectar Straw -- Pharyngeal -- Pharyngeal- Thin Teaspoon -- Pharyngeal -- Pharyngeal- Thin Cup -- Pharyngeal -- Pharyngeal- Thin Straw -- Pharyngeal -- Pharyngeal- Puree Pharyngeal residue - valleculae;Reduced epiglottic inversion Pharyngeal -- Pharyngeal- Mechanical Soft -- Pharyngeal -- Pharyngeal- Regular -- Pharyngeal -- Pharyngeal- Multi-consistency -- Pharyngeal -- Pharyngeal- Pill -- Pharyngeal -- Pharyngeal Comment --  CHL IP CERVICAL ESOPHAGEAL PHASE 05/02/2017 Cervical Esophageal Phase WFL Pudding Teaspoon -- Pudding Cup -- Honey Teaspoon -- Honey Cup -- Nectar Teaspoon -- Nectar Cup -- Nectar Straw -- Thin Teaspoon -- Thin Cup -- Thin Straw -- Puree -- Mechanical Soft -- Regular -- Multi-consistency -- Pill -- Cervical Esophageal Comment -- No flowsheet data found. Houston Siren 05/02/2017, 10:34 AM Orbie Pyo Colvin Caroli.Ed CCC-SLP Pager [phone]              Discharge Exam: Vitals:   05/20/17 2205 05/21/17 0923  BP: (!) 172/72   Pulse: 74 (!) 28  Resp: 17   Temp: 98.6 F (37 C)   SpO2: 97%    Vitals:   05/20/17 1313 05/20/17 1533 05/20/17 2205 05/21/17 0923  BP: (!) 152/66  (!) 172/72   Pulse: 74  74 (!) 28  Resp: 18  17   Temp: (!) 101  F (38.3 C)  100.3 F (37.9 C) 98.6 F (37 C)   TempSrc: Rectal Rectal Axillary   SpO2: 96%  97%   Weight:      Height:        General: Opening eyes but no responsive or tracking Cardiovascular: RRR, S1/S2 +, no rubs, no gallops Respiratory: Decreased air entry bilateral, no wheezing or rhonchi is Extremities: Bilateral lower extremity edema  The results of significant diagnostics from this hospitalization (including imaging, microbiology, ancillary and laboratory) are listed below for reference.     Microbiology: Recent Results (from the past 240 hour(s))  Blood Culture (routine x 2)     Status: None (Preliminary result)   Collection Time: 05/17/17  2:15 PM  Result Value Ref Range Status   Specimen Description   Final    BLOOD LEFT ANTECUBITAL Performed at [facility] [address], [place], [place] [address]    Special Requests   Final    BOTTLES DRAWN AEROBIC AND ANAEROBIC Blood Culture adequate volume Performed at [facility] [address], [place], [place] [address]    Culture   Final    NO GROWTH 3 DAYS Performed at [facility], [place] [address], [place], [place] [address]    Report Status PENDING  Incomplete  Blood Culture (routine x 2)     Status: None (Preliminary result)   Collection Time: 05/17/17  2:15 PM  Result Value Ref Range Status   Specimen Description   Final    BLOOD LEFT ANTECUBITAL Performed at [facility] [address], [place], [place] [address]    Special Requests   Final    BOTTLES DRAWN AEROBIC AND ANAEROBIC Blood Culture adequate volume Performed at [facility] [address], [place], [place] [address]    Culture   Final    NO GROWTH 2 DAYS Performed at [facility] [address], [place], [place] [address]    Report Status PENDING  Incomplete  MRSA PCR Screening     Status: None   Collection Time: 05/18/17  3:28 AM  Result Value Ref Range Status    MRSA by PCR NEGATIVE NEGATIVE Final    Comment:        The GeneXpert MRSA Assay (FDA approved for NASAL specimens only), is one component of a comprehensive MRSA colonization surveillance program. It is not intended to diagnose MRSA infection nor to guide or monitor treatment for MRSA infections. Performed at [facility], [place] [address] [place], [place] [address]      Labs: BNP (last 3 results) No results for input(s): BNP in the last 8760 hours. Basic Metabolic Panel: Recent Labs  Lab 05/17/17 1317 05/18/17 0918 05/18/17 1655 05/19/17 0527 05/19/17 1816 05/20/17 0318  NA 148* 145  --  140  --  138  K 4.0 4.4  --  4.1  --  4.3  CL 117* 118*  --  115*  --  111  CO2 21* 17*  --  16*  --  17*  GLUCOSE 69 172*  --  218*  --  220*  BUN 27* 29*  --  34*  --  31*  CREATININE 2.09* 2.30*  --  2.67*  --  2.62*  CALCIUM 8.9 7.9*  --  7.9*  --  8.1*  MG  --  1.7 1.8 1.8 1.9  --   PHOS  --  4.1 3.6 3.4 3.6  --  Liver Function Tests: Recent Labs  Lab 05/17/17 1317  AST 68*  ALT 38  ALKPHOS 327*  BILITOT 0.4  PROT 7.0  ALBUMIN 2.3*   No results for input(s): LIPASE, AMYLASE in the last 168 hours. No results for input(s): AMMONIA in the last 168 hours. CBC: Recent Labs  Lab 05/17/17 1317 05/18/17 0918 05/19/17 0527 05/20/17 0318  WBC 21.4* 29.9* 22.7* 13.7*  NEUTROABS 19.2* 26.9* 19.4* 11.7*  HGB 8.9* 7.1* 6.5* 6.6*  HCT 28.1* 22.3* 20.3* 20.5*  MCV 82.9 81.4 83.2 83.7  PLT 379 295 246 261   Cardiac Enzymes: No results for input(s): CKTOTAL, CKMB, CKMBINDEX, TROPONINI in the last 168 hours. BNP: Invalid input(s): POCBNP CBG: Recent Labs  Lab 05/19/17 2002 05/20/17 0008 05/20/17 0401 05/20/17 0732 05/20/17 1114  GLUCAP 267* 225* 210* 207* 159*   D-Dimer No results for input(s): DDIMER in the last 72 hours. Hgb A1c No results for input(s): HGBA1C in the last 72 hours. Lipid Profile No results for input(s): CHOL, HDL, LDLCALC,  TRIG, CHOLHDL, LDLDIRECT in the last 72 hours. Thyroid function studies No results for input(s): TSH, T4TOTAL, T3FREE, THYROIDAB in the last 72 hours.  Invalid input(s): FREET3 Anemia work up Recent Labs    05/19/17 1033  FERRITIN 395*  TIBC 206*  IRON 14*   Urinalysis    Component Value Date/Time   COLORURINE YELLOW 05/17/2017 1415   APPEARANCEUR HAZY (A) 05/17/2017 1415   LABSPEC >1.030 (H) 05/17/2017 1415   PHURINE 5.5 05/17/2017 1415   GLUCOSEU NEGATIVE 05/17/2017 1415   HGBUR MODERATE (A) 05/17/2017 1415   BILIRUBINUR NEGATIVE 05/17/2017 1415   KETONESUR NEGATIVE 05/17/2017 1415   PROTEINUR 300 (A) 05/17/2017 1415   NITRITE POSITIVE (A) 05/17/2017 1415   LEUKOCYTESUR NEGATIVE 05/17/2017 1415   Sepsis Labs Invalid input(s): PROCALCITONIN,  WBC,  LACTICIDVEN Microbiology Recent Results (from the past 240 hour(s))  Blood Culture (routine x 2)     Status: None (Preliminary result)   Collection Time: 05/17/17  2:15 PM  Result Value Ref Range Status   Specimen Description   Final    BLOOD LEFT ANTECUBITAL Performed at [facility], [place] [address] [place], [place] [address]    Special Requests   Final    BOTTLES DRAWN AEROBIC AND ANAEROBIC Blood Culture adequate volume Performed at [facility] [address] [place], [place] [address]    Culture   Final    NO GROWTH 3 DAYS Performed at [facility], [place] [address], [place], [place] [address]    Report Status PENDING  Incomplete  Blood Culture (routine x 2)     Status: None (Preliminary result)   Collection Time: 05/17/17  2:15 PM  Result Value Ref Range Status   Specimen Description   Final    BLOOD LEFT ANTECUBITAL Performed at [facility] [address] [place], [place] [address]    Special Requests   Final    BOTTLES DRAWN AEROBIC AND ANAEROBIC Blood Culture adequate volume Performed at [facility] [address],  [place], [place] [address]    Culture   Final    NO GROWTH 2 DAYS Performed at [facility] [address] [place], [place] [address]    Report Status PENDING  Incomplete  MRSA PCR Screening     Status: None   Collection Time: 05/18/17  3:28 AM  Result Value Ref Range Status   MRSA by PCR NEGATIVE NEGATIVE Final  Comment:        The GeneXpert MRSA Assay (FDA approved for NASAL specimens only), is one component of a comprehensive MRSA colonization surveillance program. It is not intended to diagnose MRSA infection nor to guide or monitor treatment for MRSA infections. Performed at [facility], [place] [address], [place], [place] [address]      Time coordinating discharge: 32 minutes  SIGNED:  Chipper Oman, MD  [facility] 05/21/2017, 2:22 PM  Pager please text page via  [email or web address]  Note - This record has been created using [employer]. Chart creation errors have been sought, but may not always have been located. Such creation errors do not reflect on the standard of medical care.

## 2017-05-21 NOTE — Progress Notes (Signed)
Nutrition Brief Note  Chart reviewed. Pt seen for initial assessment by RD on 3/17. Palliative Care following and met with pt's brother's yesterday. Reviewed note from that meeting which outlines plan to transition to full comfort care and to stop TF.  No further nutrition interventions warranted at this time. Please re-consult as needed.       Jarome Matin, MS, RD, LDN, [facility] Inpatient Clinical Dietitian Pager # [phone] After hours/weekend pager # [phone]

## 2017-05-21 NOTE — Progress Notes (Signed)
[facility] room available for Mr. Richard Bennett today. Paper work completed with his brother Ruthann Cancer. Attempted contact with son. He did not return call. CSW [place] aware.   DC summary sent to [phone].  RN please call report to [phone].  Thank you,  Erling Conte, LCSW [phone]

## 2017-05-21 NOTE — Progress Notes (Addendum)
Daily Progress Note   Patient Name: Richard Bennett       Date: 05/21/2017 DOB: 04/27/1950  Age: 67 y.o. MRN#: [id number] Attending Physician: Patrecia Pour, Christean Grief, MD Primary Care Physician: [name], MD Admit Date: 05/17/2017  Reason for Consultation/Follow-up: Establishing goals of care and Terminal Care  Subjective: Patient very SOB, with RR >30. Eyes open, but not tracking. Not following commands. Requested RN to give morphine bolus.   Review of Systems  Unable to perform ROS: Patient nonverbal    Length of Stay: 4  Current Medications: Scheduled Meds:  . sodium chloride flush  3 mL Intravenous Q12H    Continuous Infusions: . sodium chloride    . morphine 1 mg/hr (05/20/17 1653)    PRN Meds: sodium chloride, glycopyrrolate, LORazepam, morphine, sodium chloride flush  Physical Exam          Vital Signs: BP (!) 172/72 (BP Location: Right Arm)   Pulse (!) 28   Temp 98.6 F (37 C) (Axillary)   Resp 17   Ht 5\' 8"  (1.727 m)   Wt 78.8 kg (173 lb 11.6 oz)   SpO2 97%   BMI 26.41 kg/m  SpO2: SpO2: 97 % O2 Device: O2 Device: Room Air O2 Flow Rate: O2 Flow Rate (L/min): 2 L/min  Intake/output summary:   Intake/Output Summary (Last 24 hours) at 05/21/2017 0925 Last data filed at 05/21/2017 0851 Gross per 24 hour  Intake 583.44 ml  Output 1225 ml  Net -641.56 ml   LBM: Last BM Date: 05/19/17 Baseline Weight: Weight: 78 kg (172 lb) Most recent weight: Weight: 78.8 kg (173 lb 11.6 oz)       Palliative Assessment/Data: PPS: 10      Patient Active Problem List   Diagnosis Date Noted  . Advance care planning   . Terminal care   . Palliative care by specialist   . HCAP (healthcare-associated pneumonia) 05/17/2017  . S/P percutaneous endoscopic gastrostomy  (PEG) tube placement ([place]) 05/14/2017  . 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 Acute basilic vein embolism, right 05/14/2017  . Dyslipidemia associated with type 2 diabetes mellitus ([place]) 05/14/2017  . Unspecified severe protein-calorie malnutrition ([place]) 05/14/2017  . Pressure injury of skin 05/03/2017  . Dysphagia due to recent cerebrovascular accident (CVA)   .  Goals of care, counseling/discussion   . Palliative care encounter   . Cerebral thrombosis with cerebral infarction 04/17/2017  . Acute respiratory failure with hypoxia ([place])   . Acute encephalopathy   . Hyperosmolar non-ketotic state in patient with type 2 diabetes mellitus ([place]) 04/11/2017  . Status epilepticus ([place]) 04/11/2017  . Irritable bowel syndrome with diarrhea 08/13/2016  . Anemia, chronic disease 08/13/2016  . CKD (chronic kidney disease) stage 3, GFR 30-59 ml/min ([facility]) 08/13/2016  . Left carotid stenosis 07/27/2016  . DM (diabetes mellitus), type 2, uncontrolled, periph vascular complic ([place]) 27/08/2374  . Seizure ([place])   . Hypertensive emergency 10/30/2015  . Hypertension associated with diabetes [facility])     Palliative Care Assessment & Plan   Patient Profile: 67 y.o. male  with past medical history of L MCA stroke, HTN, DM2, HLD, colon polyps, substance abuse (cocaine, benzodiazipine), bilateral watershed infarcts with severe deficits (PEG, aphasia, dysphasia, R side flaccid, L weakness), carotid stenosis, seizures admitted on 05/17/2017 with hypoxia, hypoglycemia, AMS. Chest xray shows RLL consolidation- worsening since admission. CT scan shows subdural hematoma/hygroma with 91mm shift. Albumin on admission 2.3, continuing to spike temps despite antibiotic treatments, Cr trending up, today showing Kussmaul's respirations. Palliative medicine consulted for [place].   Assessment/Recommendations/Plan   More awake today, but not tracking  No longer Kussmaul's, but RR  increased, labored  Discussed moving to residential Hospice with patient's brother as he appears to have some reserve, his brother agrees  Social work referral for [employer] morphine drip rate to 2mg /hr  Increase bolus to 4mg  prn   Goals of Care and Additional Recommendations:  Limitations on Scope of Treatment: Full Comfort Care  Code Status:  DNR  Prognosis:   < 2 weeks d/t pnuemonia with transition to full comfort care  Discharge Planning:  Hospice facility  Care plan was discussed with patient's brother- Richard Bennett  Thank you for allowing the Palliative Medicine Team to assist in the care of this patient.   Time In: 0900 Time Out: 0940 Total Time 40 mons Prolonged Time Billed no      Greater than 50%  of this time was spent counseling and coordinating care related to the above assessment and plan.  Mariana Kaufman, AGNP-C Palliative Medicine   Please contact Palliative Medicine Team phone at [phone] for questions and concerns.

## 2017-05-21 NOTE — Progress Notes (Signed)
CSW following to assist with patient's discharge to residential hospice [facility]).   CSW attempted to contact patient's son again Domenica Fail [phone]), no answer. CSW left voicmail requesting return phone call.  CSW informed that patient's brother Darriel Utter) completed paperwork for [name].   CSW contacted [facility], patient's brother notified. Patient's RN can call report to [phone], packet complete. CSW signing off, no other needs identified.   Abundio Miu, [place] Social Worker [facility] Cell#: [phone]

## 2017-05-21 NOTE — Progress Notes (Signed)
Morphine 65 ml wasted in sink witnessed by Salome Spotted, RN

## 2017-05-22 LAB — CULTURE, BLOOD (ROUTINE X 2)
Culture: NO GROWTH
SPECIAL REQUESTS: ADEQUATE

## 2017-05-23 LAB — CULTURE, BLOOD (ROUTINE X 2)
Culture: NO GROWTH
Special Requests: ADEQUATE

## 2017-05-26 LAB — LEGIONELLA PNEUMOPHILA SEROGP 1 UR AG: L. PNEUMOPHILA SEROGP 1 UR AG: NEGATIVE

## 2017-06-02 DEATH — deceased

## 2017-07-03 ENCOUNTER — Ambulatory Visit: Payer: Self-pay | Admitting: Adult Health

## 2017-07-07 ENCOUNTER — Encounter: Payer: Self-pay | Admitting: Adult Health

## 2017-07-09 ENCOUNTER — Telehealth: Payer: Self-pay | Admitting: Diagnostic Neuroimaging

## 2017-07-09 NOTE — Telephone Encounter (Signed)
Patient;s brother calling to advise patient passed away on 06/20/2017. He received a letter regarding a no show appointment which the patient had on 07-03-17 with [facility].

## 2017-07-10 ENCOUNTER — Encounter: Payer: Self-pay | Admitting: Diagnostic Neuroimaging

## 2017-07-10 NOTE — Telephone Encounter (Signed)
Spoke with pt's brother and gave him our condolences. He verbalized appreciation.

## 2018-12-19 IMAGING — MR MR MRA HEAD W/O CM
1 series · 19 of 48 positions shown · non-contrast
Comparison: Brain MRI 8842 hr today, and earlier.

CLINICAL DATA: 66-year-old male with right greater than left
hemisphere acute watershed type infarcts on brain MRI today
performed for right side weakness.

EXAM:
MRA HEAD WITHOUT CONTRAST
TECHNIQUE: Angiographic images of the Circle of Willis were obtained using MRA
technique without intravenous contrast.

[Series 5: ax (id) 2 · axial · 1.0mm · 0.43mm/px · z∈[-62,+24]mm · 19 of 184 slices shown]
[im 1/184]
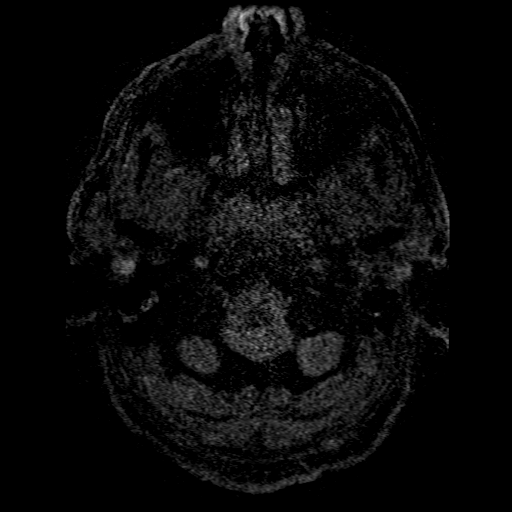
[im 4/184]
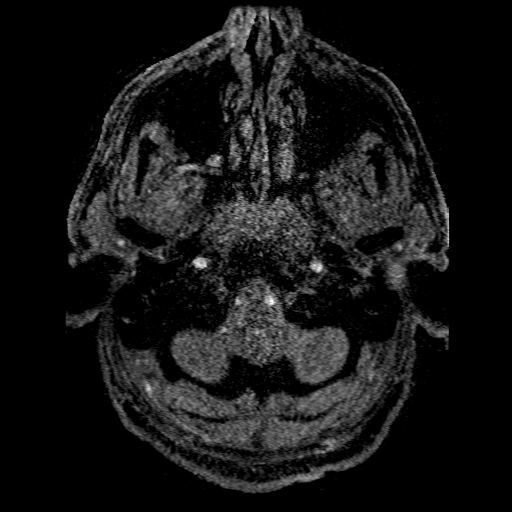
[im 8/184]
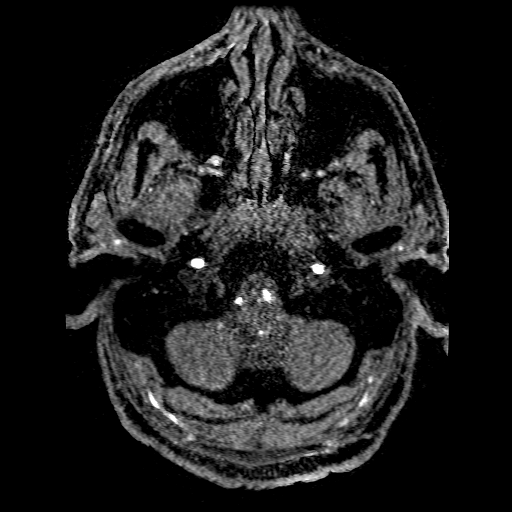
[im 12/184]
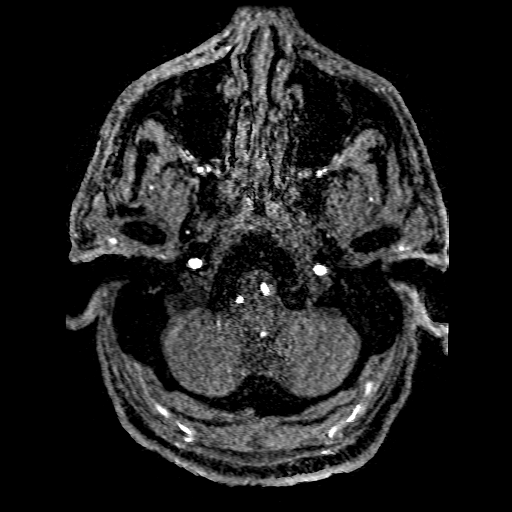
[im 16/184]
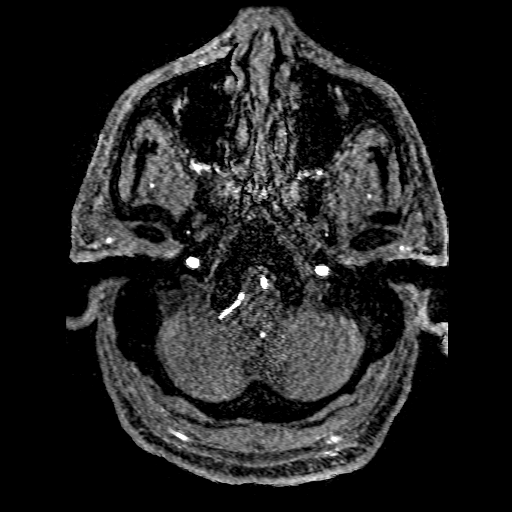
[im 20/184]
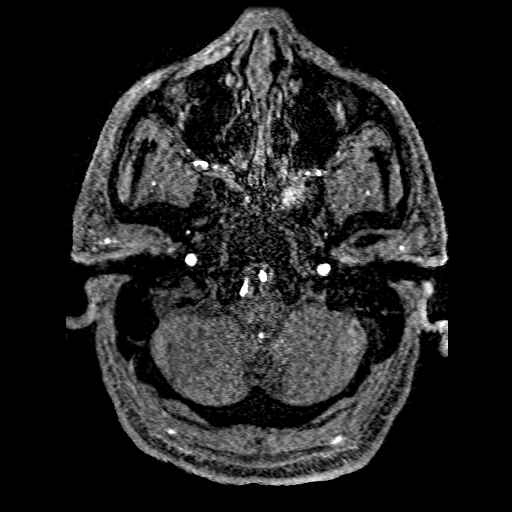
[im 24/184]
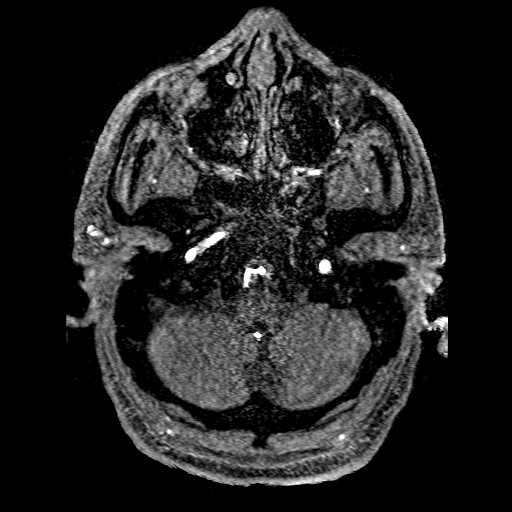
[im 28/184]
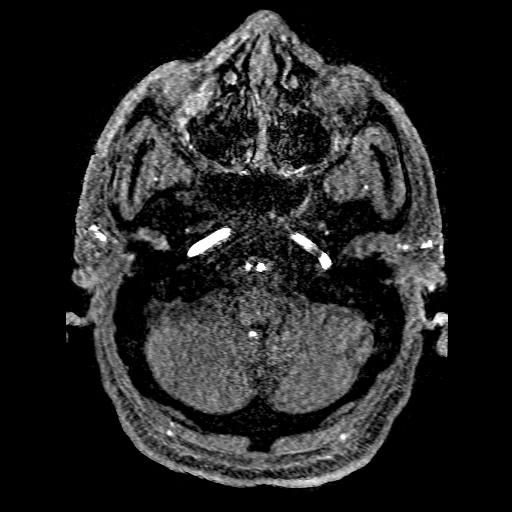
[im 32/184]
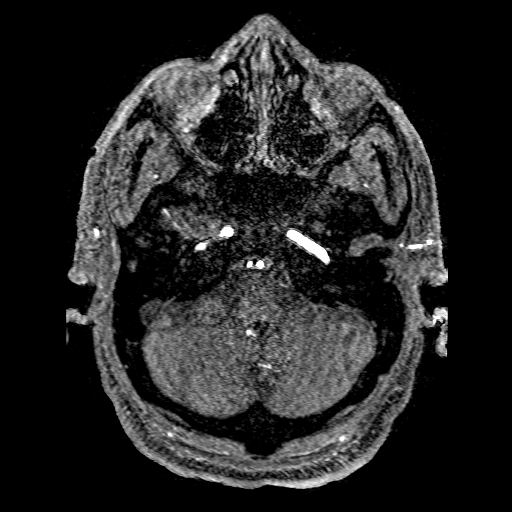
[im 36/184]
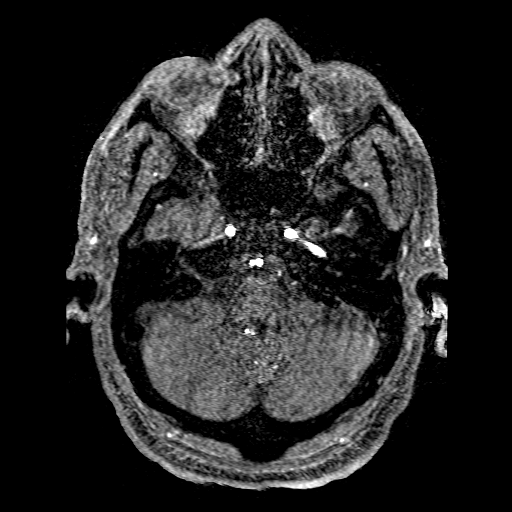
[im 39/184]
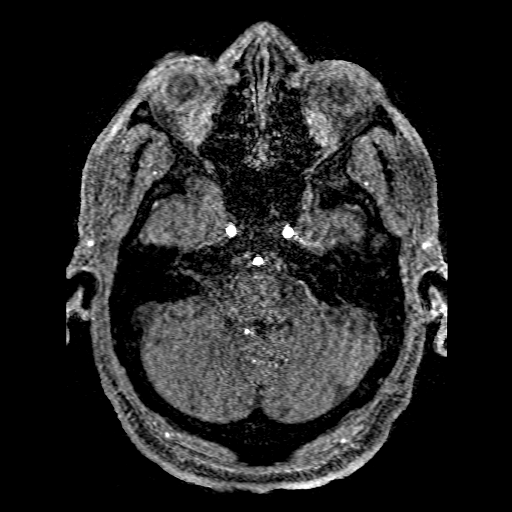
[im 59/184]
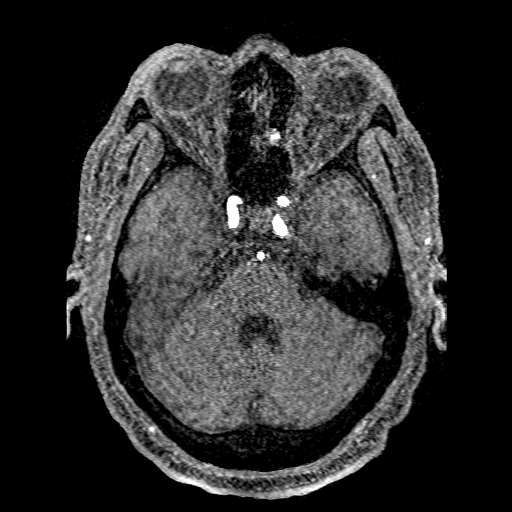
[im 82/184]
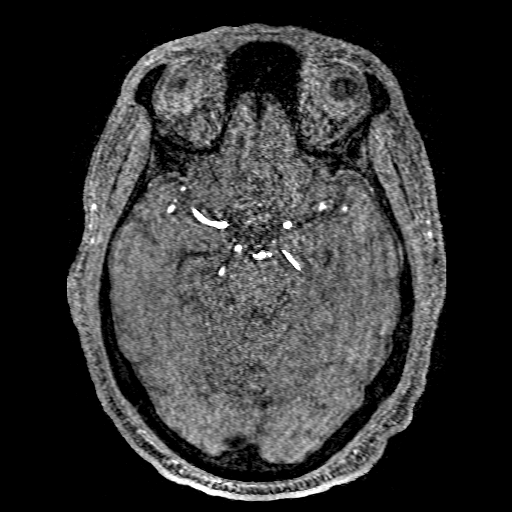
[im 94/184]
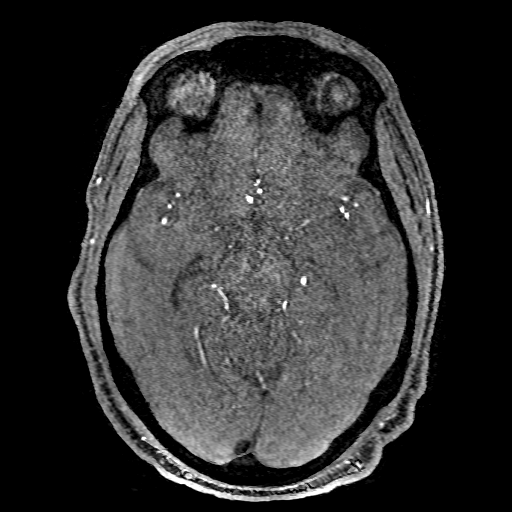
[im 106/184]
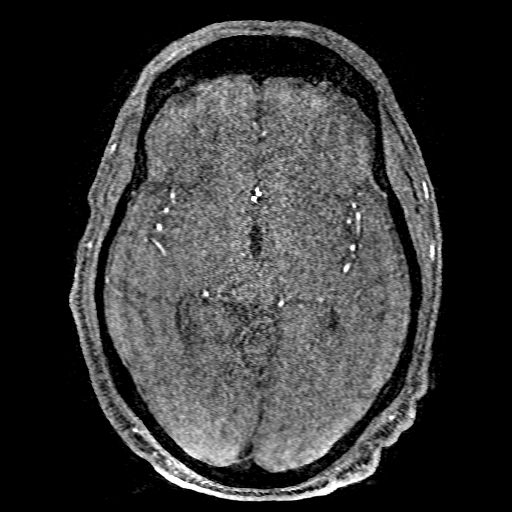
[im 129/184]
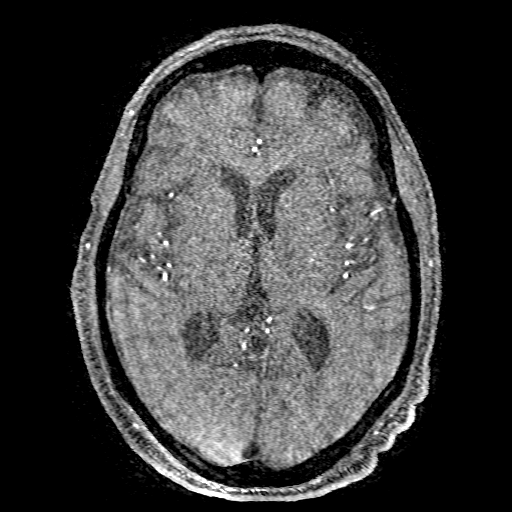
[im 152/184]
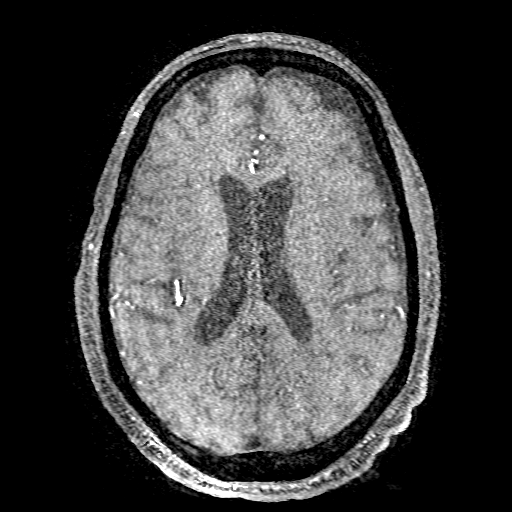
[im 156/184]
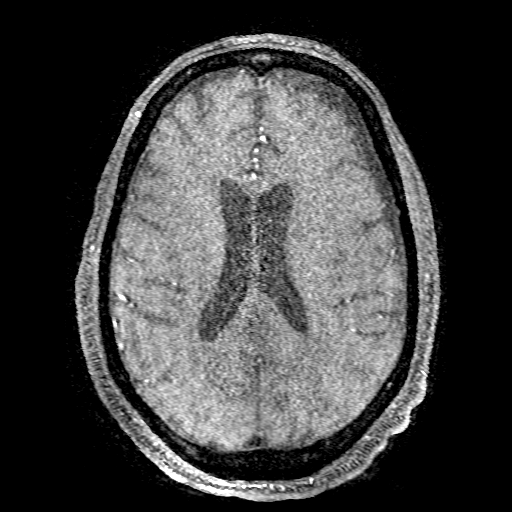
[im 176/184]
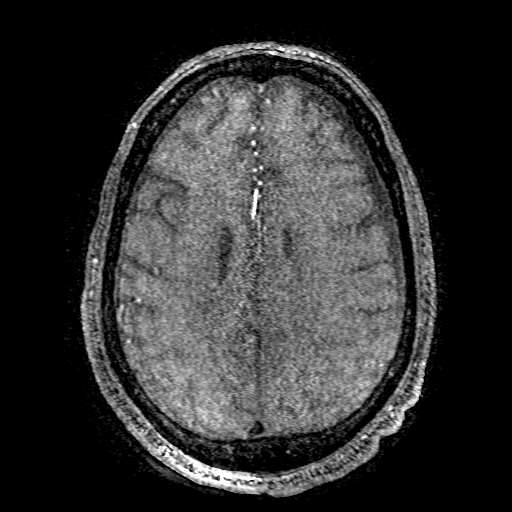

[19 of 48 positions shown; findings below may reference images not displayed]

FINDINGS: No intracranial mass effect or ventriculomegaly.

Antegrade flow in the posterior circulation with patent distal
vertebral arteries. No distal vertebral stenosis. Normal right PICA
origin. Fenestrated vertebrobasilar junction (normal variant). No
basilar stenosis. AICA, SCA, and PCA origins are normal. Diminutive
left posterior communicating artery, the right is smaller or absent.
Bilateral PCA branches are normal.

Antegrade flow in both ICA siphons. No siphon stenosis. Normal
ophthalmic and left posterior communicating artery origins. Patent
carotid termini. Normal MCA and ACA origins. The right A1 segment
appears mildly dominant. Anterior communicating artery and visible
bilateral ACA branches are within normal limits. Both MCA M1
segments and MCA bifurcations are patent. The visible right MCA
branches are within normal limits.

On the left, there is mild to moderate irregularity and stenosis at
several proximal M2 branches (series 553, image 13). Similar
irregularity and stenosis at some left M3 branch origins. However,
no left MCA branch occlusion is identified.
IMPRESSION: 1. Positive for mild to moderate irregularity and stenosis of
multiple left MCA M2 and M3 branches, but negative for left MCA
branch occlusion.
2. Otherwise negative intracranial MRA.

## 2019-01-22 IMAGING — DX DG CHEST 1V PORT
1 series · 1 of 1 positions shown · non-contrast
Comparison: Chest radiograph 05/17/2017

CLINICAL DATA: Fever

EXAM:
PORTABLE CHEST 1 VIEW

[chest ap]
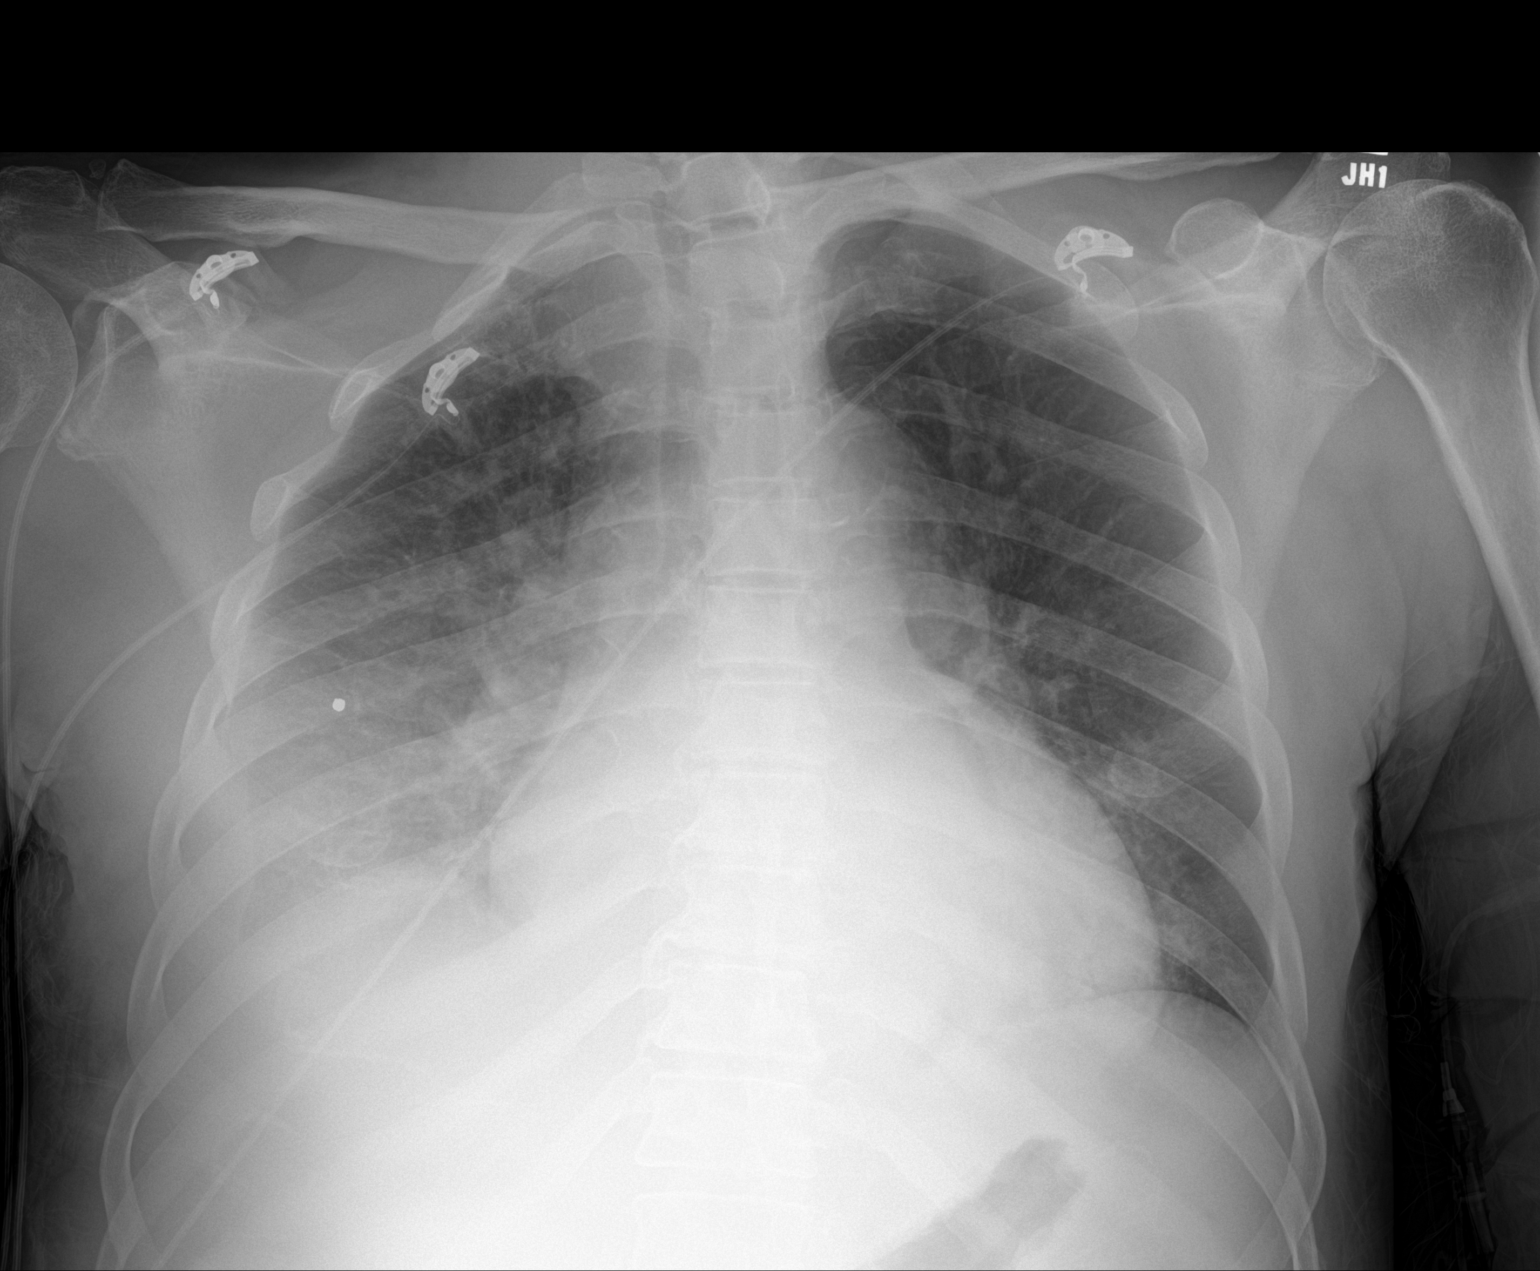

[1 of 1 positions shown; findings below may reference images not displayed]

FINDINGS: Worsening opacities in the right lower lobe with associated small
pleural effusion. Mild cardiomegaly is unchanged.No pneumothorax.
IMPRESSION: Worsening right lower lobe consolidation with development of small
right pleural effusion.
